# Patient Record
Sex: Male | Born: 1940 | Race: Black or African American | Hispanic: No | Smoking: Former smoker
Health system: Southern US, Community
[De-identification: ages and names within clinical notes are randomized; demographics above are authoritative.]

## PROBLEM LIST (undated history)

## (undated) DIAGNOSIS — I10 Essential (primary) hypertension: Secondary | ICD-10-CM

## (undated) DIAGNOSIS — E785 Hyperlipidemia, unspecified: Secondary | ICD-10-CM

## (undated) DIAGNOSIS — N1831 Chronic kidney disease, stage 3a: Secondary | ICD-10-CM

## (undated) DIAGNOSIS — Z8042 Family history of malignant neoplasm of prostate: Secondary | ICD-10-CM

## (undated) DIAGNOSIS — C61 Malignant neoplasm of prostate: Secondary | ICD-10-CM

## (undated) DIAGNOSIS — N3281 Overactive bladder: Secondary | ICD-10-CM

## (undated) DIAGNOSIS — Z9181 History of falling: Secondary | ICD-10-CM

## (undated) DIAGNOSIS — C7951 Secondary malignant neoplasm of bone: Principal | ICD-10-CM

## (undated) DIAGNOSIS — Z803 Family history of malignant neoplasm of breast: Secondary | ICD-10-CM

## (undated) DIAGNOSIS — G4730 Sleep apnea, unspecified: Secondary | ICD-10-CM

## (undated) DIAGNOSIS — K219 Gastro-esophageal reflux disease without esophagitis: Secondary | ICD-10-CM

## (undated) DIAGNOSIS — H9190 Unspecified hearing loss, unspecified ear: Secondary | ICD-10-CM

## (undated) DIAGNOSIS — C801 Malignant (primary) neoplasm, unspecified: Secondary | ICD-10-CM

## (undated) DIAGNOSIS — D649 Anemia, unspecified: Secondary | ICD-10-CM

## (undated) DIAGNOSIS — F0390 Unspecified dementia without behavioral disturbance: Secondary | ICD-10-CM

## (undated) HISTORY — DX: Family history of malignant neoplasm of prostate: Z80.42

## (undated) HISTORY — PX: OTHER SURGICAL HISTORY: SHX169

## (undated) HISTORY — DX: Gastro-esophageal reflux disease without esophagitis: K21.9

## (undated) HISTORY — DX: Family history of malignant neoplasm of breast: Z80.3

## (undated) HISTORY — PX: HAND SURGERY: SHX662

## (undated) HISTORY — PX: TONSILLECTOMY: SHX5217

## (undated) HISTORY — PX: INGUINAL HERNIA REPAIR: SUR1180

## (undated) HISTORY — PX: PROSTATECTOMY: SHX69

## (undated) HISTORY — PX: CHOLECYSTECTOMY: SHX55

## (undated) HISTORY — DX: Secondary malignant neoplasm of bone: C79.51

## (undated) HISTORY — DX: Hyperlipidemia, unspecified: E78.5

## (undated) HISTORY — DX: Sleep apnea, unspecified: G47.30

## (undated) HISTORY — DX: Malignant neoplasm of prostate: C61

---

## 1999-04-12 ENCOUNTER — Encounter: Admission: RE | Admit: 1999-04-12 | Discharge: 1999-07-08 | Payer: Self-pay | Admitting: Anesthesiology

## 1999-07-08 ENCOUNTER — Encounter: Admission: RE | Admit: 1999-07-08 | Discharge: 1999-10-06 | Payer: Self-pay | Admitting: Anesthesiology

## 1999-07-11 ENCOUNTER — Encounter: Payer: Self-pay | Admitting: Anesthesiology

## 1999-07-31 ENCOUNTER — Encounter: Payer: Self-pay | Admitting: Anesthesiology

## 1999-10-31 ENCOUNTER — Encounter: Admission: RE | Admit: 1999-10-31 | Discharge: 2000-01-29 | Payer: Self-pay | Admitting: Anesthesiology

## 2000-02-12 ENCOUNTER — Encounter: Admission: RE | Admit: 2000-02-12 | Discharge: 2000-05-12 | Payer: Self-pay | Admitting: Anesthesiology

## 2000-05-22 ENCOUNTER — Encounter: Admission: RE | Admit: 2000-05-22 | Discharge: 2000-08-20 | Payer: Self-pay | Admitting: Anesthesiology

## 2000-10-05 ENCOUNTER — Encounter: Admission: RE | Admit: 2000-10-05 | Discharge: 2001-01-03 | Payer: Self-pay | Admitting: Anesthesiology

## 2001-01-20 ENCOUNTER — Encounter: Admission: RE | Admit: 2001-01-20 | Discharge: 2001-04-20 | Payer: Self-pay | Admitting: Anesthesiology

## 2001-05-31 ENCOUNTER — Encounter: Admission: RE | Admit: 2001-05-31 | Discharge: 2001-06-26 | Payer: Self-pay | Admitting: Anesthesiology

## 2001-12-10 ENCOUNTER — Encounter: Payer: Self-pay | Admitting: Otolaryngology

## 2001-12-10 ENCOUNTER — Ambulatory Visit (HOSPITAL_COMMUNITY): Admission: RE | Admit: 2001-12-10 | Discharge: 2001-12-10 | Payer: Self-pay | Admitting: Otolaryngology

## 2001-12-14 ENCOUNTER — Other Ambulatory Visit: Admission: RE | Admit: 2001-12-14 | Discharge: 2001-12-14 | Payer: Self-pay | Admitting: Otolaryngology

## 2001-12-27 ENCOUNTER — Encounter (INDEPENDENT_AMBULATORY_CARE_PROVIDER_SITE_OTHER): Payer: Self-pay | Admitting: *Deleted

## 2001-12-27 ENCOUNTER — Ambulatory Visit (HOSPITAL_BASED_OUTPATIENT_CLINIC_OR_DEPARTMENT_OTHER): Admission: RE | Admit: 2001-12-27 | Discharge: 2001-12-27 | Payer: Self-pay | Admitting: Otolaryngology

## 2002-08-17 ENCOUNTER — Ambulatory Visit (HOSPITAL_COMMUNITY): Admission: RE | Admit: 2002-08-17 | Discharge: 2002-08-17 | Payer: Self-pay | Admitting: Internal Medicine

## 2005-09-22 ENCOUNTER — Ambulatory Visit: Payer: Self-pay | Admitting: Internal Medicine

## 2005-11-03 ENCOUNTER — Ambulatory Visit: Payer: Self-pay | Admitting: Internal Medicine

## 2005-12-03 ENCOUNTER — Ambulatory Visit: Payer: Self-pay | Admitting: Internal Medicine

## 2005-12-03 ENCOUNTER — Ambulatory Visit (HOSPITAL_COMMUNITY): Admission: RE | Admit: 2005-12-03 | Discharge: 2005-12-03 | Payer: Self-pay | Admitting: Internal Medicine

## 2006-06-18 ENCOUNTER — Ambulatory Visit: Payer: Self-pay | Admitting: Internal Medicine

## 2010-04-08 ENCOUNTER — Ambulatory Visit: Admission: RE | Admit: 2010-04-08 | Discharge: 2010-07-07 | Payer: Self-pay | Admitting: Radiation Oncology

## 2010-11-17 ENCOUNTER — Encounter: Payer: Self-pay | Admitting: Radiation Oncology

## 2011-03-14 NOTE — H&P (Signed)
[facility]  Patient:    [name], [name]                        MRN: [id number] Adm. Date:  [id number] Attending:  Thyra Breed CC:         Dorise Hiss, M.D.   History and Physical  FOLLOW-UP EVALUATION:  [name] comes in for follow-up evaluation today. Since his last evaluation, he continues to have anterior thigh pain bilaterally which is brought on by walking or working and improved by laying down. He has noted that the Neurontin and Ultram are helping to a degree, but he is getting incomplete relief. He rates his pain at 8/10. He denies any new neurologic symptoms.  CURRENT MEDICATIONS: 1. Ritalin 10 mg twice a day. 2. Ultram 50 mg one to two p.o. q.8h. 3. Prozac 20 mg two in the morning. 4. Procardia XL. 5. Neurontin 800 mg three times a day.  PHYSICAL EXAMINATION:  VITAL SIGNS:  Blood pressure is 162/84, heart rate is 85, respiratory rate is 20, O2 saturation is 97%, pain level is 8.5/10.  NEUROLOGICAL:  He exhibits chronic changes from his right hand from previous injury. He exhibits negative straight leg raise signs with deep tendon reflexes symmetric.  IMPRESSION: 1. 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 2. Other medical problems per Dorise Hiss, M.D.  DISPOSITION: 1. Continue on Neurontin 800 mg one p.o. q.8h. 2. Continue with his other medications. 3. Trial of Keppra 250 mg one p.o. q.p.m. x 2 weeks and one p.o. b.i.d., #60  with two refills. He is advised of the potential side effects. 4. Follow up with me in 4 weeks. DD:  01/21/01 TD:  01/21/01 Job: [id number]

## 2011-03-14 NOTE — Consult Note (Signed)
[facility]  Patient:    [name], [name]                        MRN: [id number] Proc. Date: 07/27/00 Adm. Date:  [id number] Attending:  Thyra Breed CC:         Lucianne Lei, M.D., [place], [place]   Consultation Report  FOLLOWUP EVALUATION:  [name] comes in for followup evaluation of his chronic low back pain on the basis of lumbar spondylosis.  Since his last evaluation, the patient lost his daughter and missed his last appointment.  He is now coming in for followup.  He continues on Ultram and Ritalin.  He has noted that he is having problems with right ankle discomfort and ongoing groin discomforts.  He notes that the groin discomfort became more prominent after lifting his daughter in her terminal stages.  He did come by and get the Neurontin that we had sent to [place] and he is on that now.  He does feel as though it is helping. He rates his pain at 8/10.  EXAMINATION  VITAL SIGNS:  Blood pressure 150/82, heart rate 70, respiratory rate is 14, O2 saturation is 97%, pain level is 8/10 and temperature is 98.6.  NEUROMUSCULAR:  Straight leg raise signs are negative.  Deep tendon reflexes are symmetric.  He has no tenderness over the Achilles tendon today, with good range of motion of his right ankle and foot.  IMPRESSION 1. Chronic low back pain on the basis of lumbar spondylosis. 2. Right ankle pain which may be related to his back versus secondary to a    tendinopathy of the ankle. 3. Other medical problems per Dr. Lucianne Lei in [place].  DISPOSITION 1. Continue on current medications. 2. The patient was advised he might benefit from a trial of apple cider    vinegar and honey to see if this will reduce some of his articular    discomfort, since it has less side-effects and is much cheaper than    nonsteroidals.  He stated he can take ibuprofen and I advised him that he    could use that as an alternative also. 4. I plan to see the patient back  in followup in about three months. 5. The patient was encouraged to follow up with Dr. Anne Hahn if he thinks he is    getting a hernia in his groin. DD:  07/27/00 TD:  07/28/00 Job: [id number]

## 2011-03-14 NOTE — H&P (Signed)
[facility]  Patient:    [name], [name]                        MRN: [id number] Adm. Date:  [id number] Attending:  Thyra Breed CC:         Dorise Hiss, M.D.   History and Physical  FOLLOW-UP EVALUATION:  Arlan comes in for follow-up evaluation of his chronic low back pain on the basis of lumbar spondylosis.  Since his last evaluation, his lower back discomfort seems to be less of his problem, and he has complained of ongoing groin discomfort.  He saw Dr. _____, who has recommended that he go back to see Dr. Anne Hahn.  He has not seen Dr. Anne Hahn.  He continues on Ritalin, Ultram, Neurontin, and Keppra.  He does feel that the Keppra is helping.  He complains of a dull, achy pain in his groin, radiating along the medial aspect of his left leg to the foot, which is exacerbated by activity such as mowing.  It will last for about 12 to 36 hours after he mows.  PHYSICAL EXAMINATION:  VITAL SIGNS:  Blood pressure is 126/83, heart rate 78, respiratory rate 18, O2 saturation 97%, pain level is 8 out of 10.  MUSCULOSKELETAL/NEUROLOGIC:  Straight leg raise signs are negative.  Deep tendon reflexes are symmetric.  IMPRESSION: 1. Chronic low back pain on the basis of lumbar spondylosis. 2. Groin pain, for which he plans to see Dr. Anne Hahn. 3. Other medical problems per Dr. Anne Hahn.  DISPOSITION: 1. Continue on Neurontin at current dose as well as Keppra at 250 mg one twice    a day. 2. Follow up with me in six weeks, at which time we will consider increasing    the Keppra if he continues to note some benefit from it.  He states he does    not need prescriptions today. DD:  02/18/01 TD:  02/19/01 Job: [id number]

## 2011-03-14 NOTE — H&P (Signed)
[facility]  Patient:    [name], [name]                        MRN: [id number] Adm. Date:  [id number] Attending:  Thyra Breed CC:         Dorise Hiss, M.D.   History and Physical  FOLLOW-UP EVALUATION:  [name] comes in for follow-up evaluation today. Since his last evaluation, the patient has not noted a great deal of difference in his back discomfort. He has remained on the Neurontin and Ultram. He is off the Ritalin now. He is taking the Neurontin sporadically. In addition, he is also on Prozac, Procardia.  PHYSICAL EXAMINATION:  VITAL SIGNS:  Blood pressure is 119/70, heart rate is 79, respiratory rate is 16, O2 saturation is 98%, pain level is 8.5/10.  NEUROLOGICAL:  Straight leg raise signs are negative. Deep tendon reflexes were symmetric in the lower extremities. He has trace edema.  IMPRESSION: 1. Chronic low back pain on the basis of lumbar spondylosis. 2. Peripheral edema, improved. 3. Other medical problems per Dorise Hiss, M.D.  DISPOSITION: 1. Increase Neurontin to 800 mg one p.o. q.6h. He does not need a prescription    for this. 2. Continue on his other medications. 3. Follow up with me in two to three months or here at the [facility]. DD:  06/01/01 TD:  06/01/01 Job: [id number]

## 2011-03-14 NOTE — H&P (Signed)
[facility]  Patient:    [name], [name]                        MRN: [id number] Adm. Date:  [id number] Attending:  Thyra Breed CC:         Dorise Hiss, M.D.                         History and Physical  FOLLOWUP EVALUATION:  The patient comes in for followup evaluation of his chronic low back pain syndrome today.  Since his last evaluation, he has developed increasing edema of his lower extremities over the past week and has not consulted with his primary care physician; he was strongly encouraged to go ahead with this.  His pain in general is not changed from his last visit but he has not been taking the Keppra because he could not afford it.  He has continued on the Ritalin, Neurontin and Ultram.  He did feel as though the Keppra was helping him in the past.  He continues to have back discomfort which is most pronounced when he is walking and improved by lying down.  PHYSICAL EXAMINATION:  VITAL SIGNS:  Blood pressure 151/96.  Heart rate 78.  Respiratory rate is 18. O2 saturation is 98%.  Pain level is 9/10.  EXTREMITIES:  He has 1+ pitting edema of the lower extremities.  Deep tendon reflexes were symmetric in the lower extremities with negative straight leg raise signs.  IMPRESSION: 1. Chronic low back pain syndrome on the basis of lumbar spondylosis with    partial improvement with a combination of Keppra and Neurontin; he has    recently stopped the Keppra. 2. Peripheral edema for which he needs to see Dr. Dorise Hiss. 3. Other medical problems per Dr. Anne Hahn.  DISPOSITION: 1. Stop Ritalin as his pressure is up today; it has not been in the past. 2. Continue on Neurontin and consider restarting the Keppra if he can afford    this. 3. Follow up with me in eight weeks. DD:  04/01/01 TD:  04/02/01 Job: [id number]

## 2011-03-14 NOTE — Op Note (Signed)
NAME:  [name], [name] NO.:  [id number]   MEDICAL RECORD NO.:  [id number]          PATIENT TYPE:  AMB   LOCATION:  DAY                           FACILITY:  [facility]   PHYSICIAN:  Lionel December, M.D.    DATE OF BIRTH:  03-Sep-1941   DATE OF PROCEDURE:  12/03/2005  DATE OF DISCHARGE:                                 OPERATIVE REPORT   PROCEDURE:  Colonoscopy.   INDICATIONS:  Johndavid is a 70 year old Afro-American male with recurrent  hematochezia. It is felt that he may be bleeding from hemorrhoids; however,  he has not responded therapy. His last colonoscopy was in October 2003.  Procedure and risks were reviewed with the patient, and informed consent was  obtained.   MEDICINES FOR CONSCIOUS SEDATION:  Demerol 50 mg IV, Versed 5 mg IV.   FINDINGS:  Procedure performed in endoscopy suite. The patient's vital signs  and O2 sat were monitored during the procedure and remained stable. The  patient was placed in left lateral position. Rectal examination performed.  No abnormality noted on external or digital exam. Olympus videoscope was  placed in rectum where he had a large amount of the thick liquid stool along  with big formed pieces. I was able to pass the scope into sigmoid colon.  Vigorous washing had to be required. He had scattered stool also at sigmoid,  descending and transverse colon. Slowly and carefully, scope was passed into  cecum which was identified by appendiceal orifice and ileocecal valve.  Pictures taken for the record. As the scope was withdrawn, the colonic  mucosa was as carefully examined as possible. No polyps and/or tumor masses  noted. Similarly, no AVMs were noted. Had few small diverticula at sigmoid  colon. Rectal mucosa was normal. The scope was retroflexed to examine  anorectal junction and had hemorrhoids above the dentate line. There was  focal erythema at dentate line, but no bleeding was noted. The patient was  turned on his right side to  move pool of stool on the other side to examine  the underlying mucosa which was normal. The patient was turned back on left  side. Endoscope was straightened and withdrawn. The patient tolerated the  procedure well.   FINAL DIAGNOSES:  1.  Internal hemorrhoids.  2.  Few small diverticula at sigmoid colon.   Suspect recurrent hematochezia secondary to hemorrhoids. The examination was  somewhat compromised by quality of prep.   RECOMMENDATIONS:  1.  High-fiber diet plus fiber supplement 3 to 4 grams per day.  2.  Colace 2 tablets bedtime.   He will keep written record as to frequency of bleeding episodes and return  for OV in four months. If bleeding episodes continue, he may need banding or  injection therapy for these hemorrhoids.      Lionel December, M.D.  Electronically Signed     NR/MEDQ  D:  12/03/2005  T:  12/03/2005  Job:  [id number]   cc:   Dorise Hiss, M.D.  Fax: [phone]

## 2011-03-14 NOTE — Op Note (Signed)
NAME:  [name], [name]                           ACCOUNT NO.:  [id number]   MEDICAL RECORD NO.:  [id number]                   PATIENT TYPE:  AMB   LOCATION:  DAY                                  FACILITY:  [facility]   PHYSICIAN:  R. Roetta Sessions, M.D.              DATE OF BIRTH:  October 16, 1941   DATE OF PROCEDURE:  08/17/2002  DATE OF DISCHARGE:                                 OPERATIVE REPORT   PROCEDURE:  Colonoscopy followed by esophagogastric biopsy.   INDICATIONS FOR PROCEDURE:  The patient's a 70 year old gentleman with  Hemoccult positive stool and a single episode of hematochezia.  He also  takes ibuprofen daily and has a history of peptic ulcer disease.  Colonoscopy is now being done to further evaluate his symptoms.  If  colonoscopy is unrevealing, will proceed with EG.  I have discussed this  approach with the patient today and previously at the bedside.  Potential  risks, benefits, and alternatives have been reviewed and questions answered.  He is agreeable.  ASA score 2 .  Please see my dictated consultation note  for more information.   PROCEDURE NOTE:  O2 saturation, blood pressure, pulse, respirations were  monitored throughout the entire procedure.   CONSCIOUS SEDATION:  IV Versed and Demerol in incremental doses.   INSTRUMENT USED:  Olympus video chip adult colonoscope and diagnostic  gastroscope.   COLONOSCOPY FINDINGS:  Digital rectal exam revealed no abnormalities.   ENDOSCOPIC FINDINGS:  Prep was good.   Rectal:  Examination of the rectal mucosa including retroflex view of the  anal verge and en face view of the anal canal demonstrates friable anal  canal mucosa.  Otherwise, normal rectum.   Colon:  Colonic mucosa was surveyed from the rectosigmoid junction through  the left transverse right colon to the appendiceal orifice and ileocecal  valve.  These structures are well seen and photographed.  The colonic mucosa  of the cecum appeared normal from the level  of the cecal and ileocecal  valve.  The scope was slowly withdrawn and all previous mentioned mucosal  surfaces were again seen and again no other abnormalities were observed and  the patient tolerated the procedure well and was prepared for EGD.  Cetacaine spray was used for topical oropharyngeal anesthesia.   FINDINGS:  Examination of the tubular esophagus revealed no mucosal  abnormalities. EG junction was easily traversed.   Stomach:  Gastric cavity was emptied and insufflated well with air.  Thorough examination of the gastric mucosa including retroflex view of the  proximal stomach, esophagogastric junction demonstrated diffuse petechial  hemorrhages.  There was some minimally nodular folds diffusely.  There were  some superficial antral erosions.  There was no obvious infiltrating  process.  There was no ulcer or other abnormality.  Pylorus was patent and  easily traversed.   Duodenum:  The bulb and second portion appeared normal.   THERAPY/DIAGNOSTIC  MANEUVERS OF THE BODY:  The stomach was biopsied x2 for  histological study.   The patient tolerated the procedure well.  She was reactive to endoscopy.   COLONOSCOPIC IMPRESSION:  1. Friable anal canal.  Otherwise, normal rectum.  2. Normal colon.   ESOPHAGOGASTRIC IMPRESSION:  1. Normal esophagus.  2. Diffuse petechial hemorrhages, superificial erosions, minimal nodularity     of the mucosa.  There were no other abnormalities.  3. Normal duodenum (D1 and D2).   Suspect the patient is easing from ibuprofen insult to his upper GI tract.  We need to rule out H. pylori.   RECOMMENDATIONS:  1. Hold ibuprofen for the time being.  2. Check a CBC and H. pylori serologies today.  3. Further recommendations to follow.                                                 Jonathon Bellows, M.D.    RMR/MEDQ  D:  08/17/2002  T:  08/17/2002  Job:  [id number]   cc:   Dorise Hiss, M.D.

## 2011-03-14 NOTE — H&P (Signed)
NAME:  [name], VERRETTE NO.:  [id number]   MEDICAL RECORD NO.:  [id number]           PATIENT TYPE:  AMB   LOCATION:                                FACILITY:  [facility]   PHYSICIAN:  Lionel December, M.D.    DATE OF BIRTH:  1941-08-07   DATE OF ADMISSION:  12/03/2005  DATE OF DISCHARGE:  LH                                HISTORY & PHYSICAL   PRESENTING COMPLAINT:  Hematochezia.   HISTORY OF PRESENT ILLNESS:  [name] is 70 year old African American male who  was last seen in the office on September 22, 2005 for recurrent hematochezia.  Was felt that he had hemorrhoidal bleeding.  He was given two weeks of  Anusol-HC suppositories and asked to be on high-fiber diet along with  Benefiber.  He now returns for followup visit.  He states he is no better.  He bleeds every couple of days.  Usually it is a small amount of blood but  every now and then it is a large amount or what he describes to be heavy.  He denies anorectal or abdominal pain.  He does not do much strenuous  activity or lift weights, etc.  He remains with good appetite.  The patient  had a colonoscopy back in October 2003 for heme-positive stools.  He had  friable anal canal; otherwise normal exam performed by Dr. Jena Gauss.  He also  had EGD at that time and had H pylori gastritis, for which he was given  Prevpac for two weeks.   MEDICATIONS:  1.  He is presently on Ritalin 10 mg daily.  2.  Procardia 30 mg daily.  3.  Ibuprofen one tablet daily p.r.n.  4.  Ultram up to eight tablets a day.   PAST MEDICAL HISTORY:  1.  Hypertension.  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  3.  He has chronic low back pain secondary arthritis.   PAST SURGICAL HISTORY:  1.  Appendectomy.  2.  Tonsillectomy.  3.  Left inguinal herniorrhaphy.  4.  H pylori gastritis treated in October 2003.   ALLERGIES:  None known.   FAMILY HISTORY:  Negative for colorectal carcinoma.  Father died of prostate  carcinoma at age 59.  He is married.  He has two children in good health.  One daughter died of unknown malignancy at age 69.  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  He  smoked cigarettes for 10 years but quit seven years ago.  He does not drink  alcohol.   PHYSICAL EXAMINATION:  VITAL SIGNS:  Weight 245 pounds, height 5 feet 9-1/2  inches tall.  Pulse 78 per minute, blood pressure 142/74, temperature 98.3.  HEENT:  Conjunctivae pink.  Sclerae nonicteric.  Oropharyngeal mucosa is  normal.  NECK:  No neck masses are noted.  CARDIAC:  Regular rhythm.  Normal S1 and S2.  No murmur or gallop noted.  ABDOMEN:  Protuberant, soft and nontender without organomegaly or masses.  RECTAL:  Deferred.  He did have exam on his last visit which was normal.  EXTREMITIES:  He does not have peripheral edema or clubbing.   ASSESSMENT:  [name] is a 70 year old African American male with chronic  hematochezia.  I suspect that this is hemorrhoidal bleeding.  However, he  has not responded to therapy.  His last colonoscopy was in October 2003.  Examination needs to be repeated to make sure he does not have proctitis or  other lesions in the sigmoid colon or rectum to account for his bleeding.   RECOMMENDATIONS:  A colonoscopy to be performed at [facility] in  the near future.  I have reviewed the procedure with the patient.  He is  agreeable.      Lionel December, M.D.  Electronically Signed     NR/MEDQ  D:  11/03/2005  T:  11/04/2005  Job:  [id number]   cc:   Dorise Hiss, M.D.  Fax: [phone]   [facility]  Fax: [phone]

## 2011-03-14 NOTE — Procedures (Signed)
[facility]  Patient:    [name], [name]                        MRN: [id number] Proc. Date: 10/13/00 Adm. Date:  [id number] Attending:  Thyra Breed                           Procedure Report  NO DICTATION DD:  10/13/00 TD:  10/14/00 Job: [id number]

## 2011-03-14 NOTE — Op Note (Signed)
[facility]  Patient:    [name], [name]                        MRN: [id number] Proc. Date: 10/22/00 Adm. Date:  [id number] Attending:  Thyra Breed CC:         Dorise Hiss, M.D., [place]   Operative Report  PROCEDURE:  Lumbar epidural steroid injection.  DIAGNOSIS:  Lumbar spinal stenosis with lumbar radiculopathy.  ANESTHESIOLOGIST:  Thyra Breed, M.D.  HISTORY:  The patient has noted pretty marked improvement in his pain overall.  He rates it as mild today, although he used an 8 to 9 on scale of 0 to 10, and I think he is reversing the ratio.  He continues on his Neurontin.  His spirits are much improved since he has gotten the epidurals.  PHYSICAL EXAMINATION:  VITAL SIGNS: Blood pressure 144/82, heart rate 80, respiratory rate 16, O2 saturation 97%, temperature 97 degrees.  BACK:  He shows good healing over his previous injection site.  PROCEDURE:  After informed consent was obtained, the patient was placed in the sitting position and monitored.  His back was prepped with Betadine x 3.  A skin wheal was raised at the L4-5 interspace with 1% lidocaine.  A 20-gauge Tuohy needle was introduced in the lumbar epidural space to loss of resistance to preservative free normal saline.  There was no CSF nor blood.  Then 80 mg of Medrol and 8 ml preservative free normal saline was gently injected.  The needle was flushed with preservative free normal saline and removed intact.  POSTPROCEDURE CONDITION:  Stable.  DISCHARGE INSTRUCTIONS: 1. Resume previous diet. 2. Limitation of activities per instruction sheet as outlined by my assistant    today. 3. Continue on his Neurontin. 4. Follow up with me in eight weeks. DD:  10/22/00 TD:  10/22/00 Job: [id number]

## 2011-03-14 NOTE — H&P (Signed)
[facility]  Patient:    [name], [name]                        MRN: [id number] Adm. Date:  [id number] Attending:  Thyra Breed CC:         Dorise Hiss, M.D.   History and Physical  HISTORY OF PRESENT ILLNESS:  [name] comes in for followup evaluation of his lumbar pain with associated lumbar spondylosis and lumbar radiculopathy. Since his last evaluation, he has been relatively stable with regard to his back discomfort but has noted ongoing problems with groin discomfort.  He has not seen his urologist in a while and I highly recommended that he follow up with them.  He notes that he is getting some achiness in his lower extremities, especially brought on by walking.  He denied any new neurologic symptoms today.  CURRENT MEDICATIONS:  He continues on his Neurontin every eight hours of 800 mg, Ritalin, Ultram, Prozac, and Procardia per his primary care physician.  PHYSICAL EXAMINATION:  VITAL SIGNS:  Blood pressure 137/79, heart rate 69, respirations 20, O2 saturation 97%.  Pain level is 8/10 in his groin and minimal in his lower extremities and back.  NEUROLOGIC:  Straight leg raise signs were negative.  Deep tendon reflexes symmetric.  Gait is mildly antalgic.  PAST MEDICAL HISTORY:  For additional history, the patient has had difficulties with peptic ulcers in the past and had been taking Relafen but apparently had exacerbations of his symptoms while taking that.  IMPRESSION: 1. Chronic low-back pain on the basis of the lumbar spondylosis with    radiculopathy responsive to current medical regimen. 2. Other medical problems per Dr. Anne Hahn, his primary care physician.  DISPOSITION: 1. Continue Neurontin 800 mg 1 p.o. q.8h. 2. Since the patient cannot tolerate nonsteroidal anti-inflammatory agents, I    have recommended that he try apple cider vinegar with honey.  I gave him    the recipe for this and he is going to try this. 3. We will see if  he still qualifies for [employer] patient assistance    program. 4. Again, follow up with me in three months.  The patient was encouraged to go    ahead and establish with a urologist for his perineal discomfort. DD:  12/24/00 TD:  12/24/00 Job: [id number]

## 2011-03-14 NOTE — Procedures (Signed)
[facility]  Patient:    LEVOY, GEISEN                        MRN: [id number] Proc. Date: 10/13/00 Adm. Date:  [id number] Attending:  Thyra Breed CC:         Lucianne Lei, M.D., [place], [place]   Procedure Report  PROCEDURE:  Lumbar epidural steroid injection.  DIAGNOSIS:  Lumbar spondylosis with lumbar radiculopathy.  INTERVAL HISTORY:  The patient has noted a fairly significant improvement after his first injection, although he still rates his pain at 8/10.  He continues on his Neurontin.  PHYSICAL EXAMINATION:  Blood pressure 1307/68, heart rate 60, respiratory rate 20, O2 saturations 97%.  Pain level is 8/10, and temperature is 97.1.  He shows good healing from his previous injection site.  DESCRIPTION OF PROCEDURE:  After informed consent was obtained, the patient was placed in the sitting position and monitored.  His back was prepped with Betadine x 3.  A skin wheal was raised at the L4-L5 interspace with 1% lidocaine.  A 20 gauge Tuohy needle was introduced to the lumbar epidural space to loss of resistance to preservative-free normal saline.  There was no CSF nor blood.  Medrol 80 mg in 8 mL of preservative-free normal saline was gently injected. The needle was flushed with preservative-free normal saline and removed intact.  Postprocedure condition - stable.  DISCHARGE INSTRUCTIONS: 1. Resume previous diet. 2. Limitations on activities per instruction sheet. 3. Continue on current medications. 4. Follow up with me in 1-2 weeks. DD:  10/13/00 TD:  10/14/00 Job: [id number]

## 2011-03-14 NOTE — Op Note (Signed)
[facility]  Patient:    [name], [name] Visit Number: [id number] MRN: [id number]          Service Type: DSU Location: [facility] Attending Physician:  Susy Frizzle Dictated by:   Jeannett Senior Pollyann Kennedy, M.D. Proc. Date: 12/27/01 Admit Date:  12/27/2001   CC:         Dorise Hiss, M.D.   Operative Report  PREOPERATIVE DIAGNOSIS:  Right submandibular mass.  POSTOPERATIVE DIAGNOSIS:  Right submandibular mass.  PROCEDURE:  Excisional biopsy of right submandibular mass.  SURGEON:  Jefry H. Pollyann Kennedy, M.D.  ANESTHESIA:  General anesthesia with laryngeal mask airway.  REFERRING PHYSICIAN:  Dorise Hiss, M.D.  DISPOSITION:  The patient tolerated the procedure well, was awakened, extubated, and transferred to recovery in stable condition.  HISTORY:  This is a 70 year old with a three-month history of a right-sided neck mass.  The fine needle aspiration biopsy revealed lymphoid cells.  Risks, benefits, alternatives, complications of the procedure were explained to the patient, who seemed to understand and agreed to surgery.  DESCRIPTION OF PROCEDURE:  The patient was taken to the operating room and placed on the operating table in a supine position.  Following induction of general anesthesia via the laryngeal mask airway, the right neck was prepped and draped in the standard fashion.  A transverse incision was outlined in a skin crease approximately two fingerbreadths below the angle of the mandible. A 15 scalpel was used to incise the skin and subcutaneous tissue.  The platysma was divided with electrocautery and a flap was developed superiorly up to the angle of the mandible.  The marginal mandibular branch of the facial nerve was identified and dissected free of the underlying mass.  The mass was then removed, carefully ligating small-vessel attachments at the base of it. It was sent fresh for pathologic evaluation.  Bipolar cautery was used to provide  hemostasis in the surgical bed.  The wound was irrigated with saline and closed in layers using 3-0 chromic on the platysma and 5-0 nylon on the skin with a rubber band drain left in place.  A dressing was applied.  The patient was then awakened, extubated, and transferred to recovery. Dictated by:   Jeannett Senior Pollyann Kennedy, M.D. Attending Physician:  Susy Frizzle DD:  12/27/01 TD:  12/27/01 Job: [id number]

## 2011-03-14 NOTE — Procedures (Signed)
[facility]  Patient:    [name], [name]                        MRN: [id number] Proc. Date: 10/06/00 Adm. Date:  [id number] Attending:  Thyra Breed CC:         Lucianne Lei, M.D., [place], [place].   Procedure Report  PROCEDURE:  Lumbar epidural steroid injection.  DIAGNOSIS:  Lumbar spondylosis with lumbar radiculopathy.  INTERVAL HISTORY:  The patient was last seen back in October at which time he was relatively stable on the Neurontin Ultram combination. He had just lost his daughter and was very upset over this so we didnt pursue any intervention at that time but he has noted that while caring for his daughter, who is dying of cancer, he lifted her one time and felt a sharp pop type sensation in his back followed by some burning in his left groin. This has persisted. He continues on the Neurontin but he is only taking it twice a day rather than 3 times a day and the Neurontin.  PHYSICAL EXAMINATION:  Blood pressure 144/76, heart rate 72, respiratory rate 20, O2 saturations 98%, pain level is described at 10/10. Straight leg raise signs are negative. Deep tendon reflexes were symmetric in the lower extremities. Motor is 5/5 with symmetric bulk and tone.  DESCRIPTION OF PROCEDURE:  After informed consent was obtained, the patient was placed in the sitting position and monitored. The patients back was prepped with Betadine x 3. A skin wheal was raised at the L4-5 interspace with 1 percent lidocaine. A 20 gauge Tuohy needle was introduced to the lumbar epidural space to loss of resistance to preservative free normal saline. There was no cerebrospinal fluid nor blood. 80 mg of Medrol and 5 ml of preservative free normal saline was gently injected. The needle was flushed with 2 ml of preservative free normal saline.  CONDITION POST PROCEDURE:  Stable.  DISCHARGE INSTRUCTIONS:  Resume previous diet with limitations reviewed by my assistant today.  Continue on current medications but increase the Neurontin to 3 times a day. I will go ahead and look into having another prescription sent off for continuing the patient assistance.  Follow-up with me in 1 week for repeat epidural steroid injection. DD:  10/06/00 TD:  10/06/00 Job: [id number]

## 2011-03-14 NOTE — Consult Note (Signed)
NAME:  [name], [name] NO.:  [id number]   MEDICAL RECORD NO.:  [id number]                  PATIENT TYPE:   LOCATION:                                       FACILITY:   PHYSICIAN:  R. Roetta Sessions, M.D.              DATE OF BIRTH:  [birth year]   DATE OF CONSULTATION:  08/11/2002  DATE OF DISCHARGE:                                   CONSULTATION   REASON FOR CONSULTATION:  Hemoccult positive stool, hematochezia.   HISTORY OF PRESENT ILLNESS:  The patient is a pleasant 70 year old white  gentleman who presents today for further evaluation of hemoccult positive  stool as recommended by Dr. Anne Hahn.  He recently underwent rectal examination  and was found to have hemoccult positive stool.  The patient admits to one  episode of hematochezia four to five months ago.  He complains of  constipation, with having a bowel movement generally every two to three  days.  Denies any melena, abdominal pain, nausea, vomiting, heartburn,  dysphagia, odynophagia, weight loss.  He says he has gained about 30 pounds  over the last two years.  He gives a history of peptic ulcer disease found  at time of endoscopy by Dr. Katrinka Blazing more than 10 years ago.  He has been  taking ibuprofen one to two tablets daily p.r.n. for arthritis-type pain.   CURRENT MEDICATIONS:  1. Ritalin 10 mg b.i.d.  2. Procardia 30 mg q.d.  3. Zanaflex 4 mg b.i.d.  4. Ibuprofen one to two tablets daily p.r.n.   ALLERGIES:  No known drug allergies.   PAST MEDICAL HISTORY:  1. Sleep apnea status post UVP-III surgery.  2. Back injury with chronic back pain.  3. Hypertension.  4. History of peptic ulcer disease as outlined above.  5. Cholecystectomy, 2002.  6. Right inguinal hernia repair.   FAMILY HISTORY:  Father died of prostate cancer.  He has a history of throat  and breast cancer on his mother's side of the family but none in first  degree relatives.  Denies any family history of colorectal  cancer.   SOCIAL HISTORY:  He has been married for 30 years.  He has two living  children.  He is retired from [employer].  He quit smoking over 30-40  years ago.  Denies any alcohol use.   REVIEW OF SYSTEMS:  Please see HPI for GI and GENERAL.  GENITOURINARY:  Complains of urinary hesitancy, frequency; denies nocturia or hematuria or  dysuria.  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  CARDIOPULMONARY:  Denies any chest pain or shortness of breath.   PHYSICAL EXAMINATION:  VITAL SIGNS:  Weight 241.25 pounds, height 5 feet  10.5 inches.  Temperature 97.9, blood pressure 110/70, pulse 86.  GENERAL:  Very pleasant, well-nourished, well-developed, moderately obese  black male in no acute distress.  SKIN:  Warm and dry, no jaundice.  HEENT:  Pupils equal, round, and reactive to light.  Conjunctivae are pink,  sclerae nonicteric.  Oropharyngeal mucosa moist and pink.  No lesions,  erythema, or exudate.  NECK:  No lymphadenopathy, thyromegaly, or carotid bruits.  CHEST:  Lungs clear to auscultation.  CARDIAC:  Reveals regular rate and rhythm, normal S1 and S2.  No murmurs,  rubs, or gallops.  ABDOMEN:  Positive bowel sounds.  Obese but symmetrical.  Soft, nontender.  No organomegaly or masses.  RECTAL:  Examination deferred to time of colonoscopy.  EXTREMITIES:  No edema.  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  This is secondary to a work-  related injury.  He also had a deformity of the third finger on the right  hand due to this as well.  Extremities with no edema.   LABORATORY DATA:  On August 06, 2002:  Glucose 105, BUN 90, creatinine 1.0,  albumin 4.1, alkaline phosphatase 69, SGOT 27, SGPT 26, total bilirubin 0.4,  sodium 139, potassium 3.6.  WBC 6.7; hemoglobin 13.7; hematocrit 40.6;  platelets 155,000.   IMPRESSION:  The patient is a pleasant 70 year old gentleman who presents  today for further  evaluation of hemoccult positive stool and a single  episode of hematochezia.  Hematochezia most likely secondary to a benign  anal or rectal source such as hemorrhoids.  Hemoccult positive stool could  be secondary to hemorrhoids, polyps, AVMs, peptic ulcer disease, etc.  Given  history of ibuprofen, he could have some GI bleeding secondary to mucosal  injury anywhere along the GI tract.  It is reassuring that he has no upper  GI symptomatology.  He does report a history of peptic ulcer disease.  He  complains of mild constipation.  As he has never undergone a colonoscopy and  does have hemoccult positive stools and hematochezia, we recommend one at  this time.  I have discussed the risks, alternatives, and benefits with the  patient and he is agreeable to proceed.  I did recommend that if nothing was  found at time of colonoscopy that we proceed with EGD, and he is agreeable  to this as well.   PLAN:  1. Colonoscopy plus or minus EGD.  2. Fiber Choice two tablets daily.   I would like to thank Dr. Anne Hahn for allowing [place] to take part in the care of  this patient.      Tana Coast, Pricilla Larsson, M.D.    LL/MEDQ  D:  08/11/2002  T:  08/11/2002  Job:  [id number]   cc:   Dorise Hiss, M.D.

## 2012-04-13 ENCOUNTER — Ambulatory Visit: Payer: Self-pay | Admitting: Urology

## 2012-05-25 ENCOUNTER — Ambulatory Visit (INDEPENDENT_AMBULATORY_CARE_PROVIDER_SITE_OTHER): Payer: Medicare HMO | Admitting: Urology

## 2012-05-25 DIAGNOSIS — C61 Malignant neoplasm of prostate: Secondary | ICD-10-CM

## 2012-05-25 DIAGNOSIS — N529 Male erectile dysfunction, unspecified: Secondary | ICD-10-CM

## 2012-06-22 ENCOUNTER — Ambulatory Visit (INDEPENDENT_AMBULATORY_CARE_PROVIDER_SITE_OTHER): Payer: Medicare HMO | Admitting: Urology

## 2012-06-22 DIAGNOSIS — N529 Male erectile dysfunction, unspecified: Secondary | ICD-10-CM

## 2012-06-22 DIAGNOSIS — C61 Malignant neoplasm of prostate: Secondary | ICD-10-CM

## 2012-07-22 ENCOUNTER — Ambulatory Visit (INDEPENDENT_AMBULATORY_CARE_PROVIDER_SITE_OTHER): Payer: Medicare HMO | Admitting: Otolaryngology

## 2012-07-22 DIAGNOSIS — J343 Hypertrophy of nasal turbinates: Secondary | ICD-10-CM

## 2012-07-22 DIAGNOSIS — J310 Chronic rhinitis: Secondary | ICD-10-CM

## 2012-09-02 ENCOUNTER — Ambulatory Visit (INDEPENDENT_AMBULATORY_CARE_PROVIDER_SITE_OTHER): Payer: Medicare HMO | Admitting: Otolaryngology

## 2012-09-02 DIAGNOSIS — J310 Chronic rhinitis: Secondary | ICD-10-CM

## 2012-09-21 ENCOUNTER — Ambulatory Visit (INDEPENDENT_AMBULATORY_CARE_PROVIDER_SITE_OTHER): Payer: Medicare HMO | Admitting: Urology

## 2012-09-21 DIAGNOSIS — C61 Malignant neoplasm of prostate: Secondary | ICD-10-CM

## 2012-09-21 DIAGNOSIS — N529 Male erectile dysfunction, unspecified: Secondary | ICD-10-CM

## 2012-09-28 ENCOUNTER — Other Ambulatory Visit: Payer: Self-pay | Admitting: Urology

## 2012-09-28 DIAGNOSIS — C61 Malignant neoplasm of prostate: Secondary | ICD-10-CM

## 2012-10-01 ENCOUNTER — Ambulatory Visit (HOSPITAL_COMMUNITY)
Admission: RE | Admit: 2012-10-01 | Discharge: 2012-10-01 | Disposition: A | Discharge status: Home / Self Care | Payer: Medicare HMO | Source: Ambulatory Visit | Attending: Urology | Admitting: Urology

## 2012-10-01 ENCOUNTER — Encounter (HOSPITAL_COMMUNITY)
Admission: RE | Admit: 2012-10-01 | Discharge: 2012-10-01 | Disposition: A | Discharge status: Home / Self Care | Payer: Medicare HMO | Source: Ambulatory Visit | Attending: Urology | Admitting: Urology

## 2012-10-01 ENCOUNTER — Encounter (HOSPITAL_COMMUNITY): Payer: Self-pay

## 2012-10-01 DIAGNOSIS — C61 Malignant neoplasm of prostate: Secondary | ICD-10-CM

## 2012-10-01 DIAGNOSIS — Q619 Cystic kidney disease, unspecified: Secondary | ICD-10-CM | POA: Insufficient documentation

## 2012-10-01 HISTORY — DX: Essential (primary) hypertension: I10

## 2012-10-01 HISTORY — DX: Malignant (primary) neoplasm, unspecified: C80.1

## 2012-10-01 MED ORDER — TECHNETIUM TC 99M MEDRONATE IV KIT
25.0000 | PACK | Freq: Once | INTRAVENOUS | Status: AC | PRN
  Administered 2012-10-01: 26 via INTRAVENOUS

## 2012-10-01 MED ORDER — IOHEXOL 300 MG/ML  SOLN
100.0000 mL | Freq: Once | INTRAMUSCULAR | Status: AC | PRN
  Administered 2012-10-01: 100 mL via INTRAVENOUS

## 2012-12-21 ENCOUNTER — Ambulatory Visit: Payer: Medicare HMO | Admitting: Urology

## 2013-01-11 ENCOUNTER — Ambulatory Visit (INDEPENDENT_AMBULATORY_CARE_PROVIDER_SITE_OTHER): Payer: Medicare HMO | Admitting: Urology

## 2013-01-11 DIAGNOSIS — C61 Malignant neoplasm of prostate: Secondary | ICD-10-CM

## 2013-01-11 DIAGNOSIS — N529 Male erectile dysfunction, unspecified: Secondary | ICD-10-CM

## 2013-02-16 ENCOUNTER — Other Ambulatory Visit (HOSPITAL_COMMUNITY): Payer: Medicare HMO

## 2013-02-17 NOTE — Patient Instructions (Addendum)
ARLENE GENOVA  02/17/2013   Your procedure is scheduled on:   02/24/2013   Report to [facility] at  615  AM.  Call this number if you have problems the morning of surgery: [phone]   Remember:   Do not eat food or drink liquids after midnight.   Take these medicines the morning of surgery with A SIP OF WATER:  none   Do not wear jewelry, make-up or nail polish.  Do not wear lotions, powders, or perfumes.   Do not shave 48 hours prior to surgery. Men may shave face and neck.  Do not bring valuables to the hospital.  Contacts, dentures or bridgework may not be worn into surgery.  Leave suitcase in the car. After surgery it may be brought to your room.  For patients admitted to the hospital, checkout time is 11:00 AM the day of discharge.   Patients discharged the day of surgery will not be allowed to drive  home.  Name and phone number of your driver: family  Special Instructions: Shower using CHG 2 nights before surgery and the night before surgery.  If you shower the day of surgery use CHG.  Use special wash - you have one bottle of CHG for all showers.  You should use approximately 1/3 of the bottle for each shower.   Please read over the following fact sheets that you were given: Pain Booklet, Coughing and Deep Breathing, MRSA Information, Surgical Site Infection Prevention, Anesthesia Post-op Instructions and Care and Recovery After Surgery Penile Prosthesis Penile prostheses are semi-rigid or inflatable devices that are implanted into the penis as a treatment for erectile dysfunction (a type of impotence). Erectile dysfunction (ED) may be caused by a number of conditions, such as:  Blood vessel (vascular) diseases.  Diabetes.  Nervous system (neurologic) diseases.  Psychological (stress and anxiety) problems.  Trauma (like spinal cord injuries).  Heavy smoking/drinking.  Prolonged drug abuse.  Surgeries (like removal of the prostate gland). Penile prosthesis  surgery is a procedure for men suffering from ED who have unsuccessfully tried all other treatment options. This surgery is not recommended if you are suffering from ED due to psychological problems. There are two main types of implants used for the surgery.  Malleable penile implant (also known as non-hydraulic or semi-rigid implant).  This consists of two silicone rubber rods.  These help by providing a certain amount of rigidity.  They are also flexible, and hence, the penis can either be curved downwards in the normal position or put into an erect position for intercourse.  Inflatable penile implant (also known as hydraulic implant).  This consists of cylinders, a pump, and a reservoir.  There are different types of inflatable implants. Each has advantages and disadvantages.  The cylinders can be inflated with fluid that helps in having an erection and deflated after intercourse. LET YOUR CAREGIVER KNOW ABOUT:  Allergies.  Use of steroids (by mouth or creams).  Medicines you take including:  herbs.  eyedrops.  prescription medicine.  over-the-counter medicine.  creams.  Use of aspirin or blood-thinning medicines.  History of blood clots (thrombophlebitis).  Previous surgery.  Previous problems with anesthetics.  History of bleeding or blood problems.  Other health problems such as:  Urinary tract infections.  Skin infections. RISKS AND COMPLICATIONS   Your penis may be slightly shorter than it was before surgery.  Your urethra may be injured during the procedure.  Infection could occur to your penile  implant. Rarely, this may occur months or years after the surgery.  Your implant device may not function properly (mechanical failures). BEFORE THE PROCEDURE   You may be asked to shower with a special soap the night or morning before surgery.  You may be given intravenous antibiotics one hour before the surgery.  Hair will be removed from your  surgical site and the site will be thoroughly cleansed.  You may be given regional anesthesia or general anesthesia.  On the day of the surgery, a Foley catheter (a soft, thin, flexible rubber tube) may be passed through the urethra (urinary passage) into the bladder.  The tube will drain urine.  The catheter will also help in guiding your surgeon to identify the urethra during the procedure. PROCEDURE  A small incision is made in the scrotum or in the penis just below the head.  The cylinders of the prosthesis are inserted by the surgeon into the erectile tissue of the penis.  Additional small incisions are made in the abdomen and in the scrotum to insert the pump and the reservoir of the inflatable device.  The cylinders, reservoir (filled with fluid), and pump will be joined by tubes, and tested before your incisions are closed.  The incisions are closed using dissolvable sutures. AFTER THE PROCEDURE   The surgeon may prescribe medicines to ease pain.  You may also be given antibiotics.  You may be put on a clear liquid diet for the first 24 hours.  You may be asked to walk or sit instead of lying down. It is helpful to avoid long periods of immobility.  A towel roll may be placed under your scrotum to help reduce swelling.  The Foley catheter may be removed in the morning after surgery. HOME CARE INSTRUCTIONS  Follow all the instructions that were given by your surgeon at the time of discharge.  You may shower.  Carefully wash the incision area with soap and water, rinse, and pat dry.  Do not soak the wound or swim.  Follow a well-balanced diet. Eat plenty of fruits. Drink fluids regularly to prevent constipation.  Take all prescribed medicine as instructed.  Go for regular caregiver follow-up appointments as advised.  Avoid sexual activity for around 6 weeks.  The device will not be activated until after the recovery period. Then your caregiver will instruct  you on using the device.  Do not lift heavy objects for 4 to 6 weeks after surgery.  Do not wear tight fitting clothes. Wear clothes that are loose and comfortable.  Do not drive any vehicles or sign any legal documents, as long as you are on narcotic pain medicines. SEEK MEDICAL CARE IF:  You develop nausea and vomiting.  You develop chills.  You have an oral temperature above 102 F (38.9 C).  You develop constipation.  You develop swelling around the scrotal or penile area.  You develop difficulty in passing urine.  You develop a burning feeling or pain when passing urine. SEEK IMMEDIATE MEDICAL CARE IF:  You notice oozing of fluid or pus from the incision site.  You notice blood in your urine.  You develop severe pain that is not relieved with medicine.  You have an oral temperature above 102 F (38.9 C), not controlled by medicine.  You are not able to pass any urine. Document Released: 01/20/2008 Document Revised: 01/05/2012 Document Reviewed: 01/20/2008 [facility] Patient Information 2013 [place], [place]. PATIENT INSTRUCTIONS POST-ANESTHESIA  IMMEDIATELY FOLLOWING SURGERY:  Do not drive or  operate machinery for the first twenty four hours after surgery.  Do not make any important decisions for twenty four hours after surgery or while taking narcotic pain medications or sedatives.  If you develop intractable nausea and vomiting or a severe headache please notify your doctor immediately.  FOLLOW-UP:  Please make an appointment with your surgeon as instructed. You do not need to follow up with anesthesia unless specifically instructed to do so.  WOUND CARE INSTRUCTIONS (if applicable):  Keep a dry clean dressing on the anesthesia/puncture wound site if there is drainage.  Once the wound has quit draining you may leave it open to air.  Generally you should leave the bandage intact for twenty four hours unless there is drainage.  If the epidural site drains for more than  36-48 hours please call the anesthesia department.  QUESTIONS?:  Please feel free to call your physician or the hospital operator if you have any questions, and they will be happy to assist you.

## 2013-02-18 ENCOUNTER — Encounter (HOSPITAL_COMMUNITY)
Admission: RE | Admit: 2013-02-18 | Discharge: 2013-02-18 | Disposition: A | Discharge status: Home / Self Care | Payer: Medicare HMO | Source: Ambulatory Visit | Attending: Urology | Admitting: Urology

## 2013-02-18 DIAGNOSIS — N529 Male erectile dysfunction, unspecified: Secondary | ICD-10-CM | POA: Insufficient documentation

## 2013-02-18 DIAGNOSIS — Z01812 Encounter for preprocedural laboratory examination: Secondary | ICD-10-CM | POA: Insufficient documentation

## 2013-02-18 DIAGNOSIS — Z5309 Procedure and treatment not carried out because of other contraindication: Secondary | ICD-10-CM | POA: Insufficient documentation

## 2013-02-21 ENCOUNTER — Encounter (HOSPITAL_COMMUNITY): Payer: Self-pay | Admitting: Pharmacy Technician

## 2013-02-21 ENCOUNTER — Encounter (HOSPITAL_COMMUNITY): Payer: Self-pay

## 2013-02-21 ENCOUNTER — Other Ambulatory Visit: Payer: Self-pay

## 2013-02-21 ENCOUNTER — Encounter (HOSPITAL_COMMUNITY)
Admission: RE | Admit: 2013-02-21 | Discharge: 2013-02-21 | Disposition: A | Discharge status: Home / Self Care | Payer: Medicare HMO | Source: Ambulatory Visit | Attending: Urology | Admitting: Urology

## 2013-02-21 LAB — BASIC METABOLIC PANEL
CO2: 27 mEq/L (ref 19–32)
Chloride: 98 mEq/L (ref 96–112)
Potassium: 3.5 mEq/L (ref 3.5–5.1)
Sodium: 134 mEq/L — ABNORMAL LOW (ref 135–145)

## 2013-02-21 LAB — HEMOGLOBIN AND HEMATOCRIT, BLOOD
HCT: 37.6 % — ABNORMAL LOW (ref 39.0–52.0)
Hemoglobin: 13.2 g/dL (ref 13.0–17.0)

## 2013-02-21 LAB — SURGICAL PCR SCREEN: Staphylococcus aureus: NEGATIVE

## 2013-02-22 MED ORDER — GENTAMICIN IN SALINE 1.6-0.9 MG/ML-% IV SOLN: 80.0000 mg | Freq: Once | INTRAVENOUS | Status: DC

## 2013-02-22 MED ORDER — CEFAZOLIN SODIUM-DEXTROSE 2-3 GM-% IV SOLR: 2.0000 g | Freq: Once | INTRAVENOUS | Status: DC

## 2013-02-24 ENCOUNTER — Ambulatory Visit (HOSPITAL_COMMUNITY): Admission: RE | Admit: 2013-02-24 | Payer: Medicare HMO | Source: Ambulatory Visit | Admitting: Urology

## 2013-02-24 ENCOUNTER — Encounter (HOSPITAL_COMMUNITY): Admission: RE | Payer: Self-pay | Source: Ambulatory Visit

## 2013-02-24 SURGERY — INSERTION, PENILE PROSTHESIS, INFLATABLE
Anesthesia: Choice

## 2013-04-13 NOTE — OR Nursing (Signed)
Per [name] in office patient requested to cancel  This surgery due to the flu

## 2013-05-24 ENCOUNTER — Ambulatory Visit: Payer: Medicare HMO | Admitting: Urology

## 2013-06-14 ENCOUNTER — Ambulatory Visit: Payer: Medicare HMO | Admitting: Urology

## 2013-06-28 ENCOUNTER — Ambulatory Visit: Payer: Medicare HMO | Admitting: Urology

## 2013-07-26 ENCOUNTER — Ambulatory Visit (INDEPENDENT_AMBULATORY_CARE_PROVIDER_SITE_OTHER): Payer: Medicare HMO | Admitting: Urology

## 2013-07-26 DIAGNOSIS — N529 Male erectile dysfunction, unspecified: Secondary | ICD-10-CM

## 2013-07-26 DIAGNOSIS — C61 Malignant neoplasm of prostate: Secondary | ICD-10-CM

## 2014-01-24 ENCOUNTER — Ambulatory Visit: Payer: Medicare HMO | Admitting: Urology

## 2014-02-28 ENCOUNTER — Ambulatory Visit: Payer: Medicare HMO | Admitting: Urology

## 2014-05-08 ENCOUNTER — Encounter: Payer: Self-pay | Admitting: Urology

## 2015-11-05 DIAGNOSIS — E298 Other testicular dysfunction: Secondary | ICD-10-CM | POA: Diagnosis not present

## 2015-11-05 DIAGNOSIS — Z6839 Body mass index (BMI) 39.0-39.9, adult: Secondary | ICD-10-CM | POA: Diagnosis not present

## 2015-11-05 DIAGNOSIS — I10 Essential (primary) hypertension: Secondary | ICD-10-CM | POA: Diagnosis not present

## 2015-12-24 ENCOUNTER — Encounter: Payer: Self-pay | Admitting: Urology

## 2015-12-24 DIAGNOSIS — C61 Malignant neoplasm of prostate: Secondary | ICD-10-CM | POA: Diagnosis not present

## 2015-12-24 DIAGNOSIS — R350 Frequency of micturition: Secondary | ICD-10-CM | POA: Diagnosis not present

## 2015-12-25 DIAGNOSIS — R350 Frequency of micturition: Secondary | ICD-10-CM | POA: Diagnosis not present

## 2015-12-26 DIAGNOSIS — R972 Elevated prostate specific antigen [PSA]: Secondary | ICD-10-CM | POA: Diagnosis not present

## 2015-12-26 DIAGNOSIS — C61 Malignant neoplasm of prostate: Secondary | ICD-10-CM | POA: Diagnosis not present

## 2015-12-27 DIAGNOSIS — C61 Malignant neoplasm of prostate: Secondary | ICD-10-CM | POA: Diagnosis not present

## 2015-12-31 ENCOUNTER — Encounter: Payer: Self-pay | Admitting: Urology

## 2015-12-31 DIAGNOSIS — K769 Liver disease, unspecified: Secondary | ICD-10-CM | POA: Diagnosis not present

## 2015-12-31 DIAGNOSIS — C61 Malignant neoplasm of prostate: Secondary | ICD-10-CM | POA: Diagnosis not present

## 2015-12-31 DIAGNOSIS — M4854XA Collapsed vertebra, not elsewhere classified, thoracic region, initial encounter for fracture: Secondary | ICD-10-CM | POA: Diagnosis not present

## 2015-12-31 DIAGNOSIS — R590 Localized enlarged lymph nodes: Secondary | ICD-10-CM | POA: Diagnosis not present

## 2016-01-09 DIAGNOSIS — H2513 Age-related nuclear cataract, bilateral: Secondary | ICD-10-CM | POA: Diagnosis not present

## 2016-01-09 DIAGNOSIS — H538 Other visual disturbances: Secondary | ICD-10-CM | POA: Diagnosis not present

## 2016-01-17 DIAGNOSIS — N451 Epididymitis: Secondary | ICD-10-CM | POA: Diagnosis not present

## 2016-01-17 DIAGNOSIS — C61 Malignant neoplasm of prostate: Secondary | ICD-10-CM | POA: Diagnosis not present

## 2016-01-17 DIAGNOSIS — C772 Secondary and unspecified malignant neoplasm of intra-abdominal lymph nodes: Secondary | ICD-10-CM | POA: Diagnosis not present

## 2016-01-17 DIAGNOSIS — R972 Elevated prostate specific antigen [PSA]: Secondary | ICD-10-CM | POA: Diagnosis not present

## 2016-01-28 DIAGNOSIS — C61 Malignant neoplasm of prostate: Secondary | ICD-10-CM | POA: Diagnosis not present

## 2016-01-28 DIAGNOSIS — I10 Essential (primary) hypertension: Secondary | ICD-10-CM | POA: Diagnosis not present

## 2016-02-05 DIAGNOSIS — C61 Malignant neoplasm of prostate: Secondary | ICD-10-CM | POA: Diagnosis not present

## 2016-02-07 DIAGNOSIS — I10 Essential (primary) hypertension: Secondary | ICD-10-CM | POA: Diagnosis not present

## 2016-02-07 DIAGNOSIS — Z125 Encounter for screening for malignant neoplasm of prostate: Secondary | ICD-10-CM | POA: Diagnosis not present

## 2016-03-04 DIAGNOSIS — C7951 Secondary malignant neoplasm of bone: Secondary | ICD-10-CM | POA: Diagnosis not present

## 2016-03-04 DIAGNOSIS — C61 Malignant neoplasm of prostate: Secondary | ICD-10-CM | POA: Diagnosis not present

## 2016-04-03 DIAGNOSIS — I10 Essential (primary) hypertension: Secondary | ICD-10-CM | POA: Diagnosis not present

## 2016-04-04 ENCOUNTER — Encounter (HOSPITAL_COMMUNITY): Payer: Self-pay | Admitting: Oncology

## 2016-04-04 ENCOUNTER — Other Ambulatory Visit (HOSPITAL_COMMUNITY): Payer: Self-pay | Admitting: Oncology

## 2016-04-04 DIAGNOSIS — C61 Malignant neoplasm of prostate: Secondary | ICD-10-CM | POA: Diagnosis not present

## 2016-04-04 DIAGNOSIS — C7951 Secondary malignant neoplasm of bone: Secondary | ICD-10-CM

## 2016-04-04 HISTORY — DX: Malignant neoplasm of prostate: C61

## 2016-04-04 HISTORY — DX: Secondary malignant neoplasm of bone: C79.51

## 2016-04-07 ENCOUNTER — Ambulatory Visit (HOSPITAL_COMMUNITY): Payer: Medicare HMO

## 2016-04-07 DIAGNOSIS — R972 Elevated prostate specific antigen [PSA]: Secondary | ICD-10-CM | POA: Diagnosis not present

## 2016-04-07 DIAGNOSIS — C772 Secondary and unspecified malignant neoplasm of intra-abdominal lymph nodes: Secondary | ICD-10-CM | POA: Diagnosis not present

## 2016-04-07 DIAGNOSIS — C61 Malignant neoplasm of prostate: Secondary | ICD-10-CM | POA: Diagnosis not present

## 2016-04-09 ENCOUNTER — Telehealth (HOSPITAL_COMMUNITY): Payer: Self-pay | Admitting: Hematology & Oncology

## 2016-04-09 NOTE — Telephone Encounter (Signed)
PT'S INS HAS TERMED WITH [employer]. TRIED TO CONTACT PT BUT THE ONLY NUMBER WE HAVE IS OUT OF SERVICE. CONTACTED STAR @ [place]

## 2016-04-14 DIAGNOSIS — C772 Secondary and unspecified malignant neoplasm of intra-abdominal lymph nodes: Secondary | ICD-10-CM | POA: Diagnosis not present

## 2016-04-14 DIAGNOSIS — C61 Malignant neoplasm of prostate: Secondary | ICD-10-CM | POA: Diagnosis not present

## 2016-05-02 ENCOUNTER — Encounter (HOSPITAL_COMMUNITY): Payer: Medicare Other

## 2016-05-02 ENCOUNTER — Other Ambulatory Visit (HOSPITAL_COMMUNITY): Payer: Self-pay | Admitting: *Deleted

## 2016-05-02 ENCOUNTER — Ambulatory Visit (HOSPITAL_COMMUNITY): Payer: Medicare HMO | Admitting: Hematology & Oncology

## 2016-05-02 DIAGNOSIS — C61 Malignant neoplasm of prostate: Secondary | ICD-10-CM

## 2016-05-02 DIAGNOSIS — C7951 Secondary malignant neoplasm of bone: Secondary | ICD-10-CM

## 2016-05-06 DIAGNOSIS — Z0000 Encounter for general adult medical examination without abnormal findings: Secondary | ICD-10-CM | POA: Diagnosis not present

## 2016-05-06 DIAGNOSIS — M170 Bilateral primary osteoarthritis of knee: Secondary | ICD-10-CM | POA: Diagnosis not present

## 2016-05-06 DIAGNOSIS — I10 Essential (primary) hypertension: Secondary | ICD-10-CM | POA: Diagnosis not present

## 2016-05-06 DIAGNOSIS — Z1389 Encounter for screening for other disorder: Secondary | ICD-10-CM | POA: Diagnosis not present

## 2016-05-07 ENCOUNTER — Encounter (HOSPITAL_COMMUNITY): Payer: Self-pay | Admitting: Hematology

## 2016-05-07 ENCOUNTER — Encounter (HOSPITAL_COMMUNITY): Payer: Medicare Other | Attending: Hematology | Admitting: Hematology

## 2016-05-07 ENCOUNTER — Encounter (HOSPITAL_COMMUNITY): Payer: Medicare Other

## 2016-05-07 ENCOUNTER — Encounter (HOSPITAL_BASED_OUTPATIENT_CLINIC_OR_DEPARTMENT_OTHER): Payer: Medicare Other

## 2016-05-07 VITALS — BP 165/87 | HR 65 | Temp 98.2°F | Resp 18 | Ht 69.0 in | Wt 266.0 lb

## 2016-05-07 DIAGNOSIS — C61 Malignant neoplasm of prostate: Secondary | ICD-10-CM

## 2016-05-07 DIAGNOSIS — C7951 Secondary malignant neoplasm of bone: Secondary | ICD-10-CM

## 2016-05-07 DIAGNOSIS — N50819 Testicular pain, unspecified: Secondary | ICD-10-CM

## 2016-05-07 DIAGNOSIS — I10 Essential (primary) hypertension: Secondary | ICD-10-CM | POA: Diagnosis not present

## 2016-05-07 DIAGNOSIS — E669 Obesity, unspecified: Secondary | ICD-10-CM | POA: Diagnosis not present

## 2016-05-07 LAB — CBC WITH DIFFERENTIAL/PLATELET
BASOS PCT: 0 %
Basophils Absolute: 0 10*3/uL (ref 0.0–0.1)
EOS ABS: 0.1 10*3/uL (ref 0.0–0.7)
EOS PCT: 2 %
HCT: 36.4 % — ABNORMAL LOW (ref 39.0–52.0)
Hemoglobin: 12.4 g/dL — ABNORMAL LOW (ref 13.0–17.0)
Lymphocytes Relative: 41 %
Lymphs Abs: 2.4 10*3/uL (ref 0.7–4.0)
MCH: 29.1 pg (ref 26.0–34.0)
MCHC: 34.1 g/dL (ref 30.0–36.0)
MCV: 85.4 fL (ref 78.0–100.0)
MONOS PCT: 7 %
Monocytes Absolute: 0.4 10*3/uL (ref 0.1–1.0)
NEUTROS PCT: 50 %
Neutro Abs: 2.9 10*3/uL (ref 1.7–7.7)
PLATELETS: 135 10*3/uL — AB (ref 150–400)
RBC: 4.26 MIL/uL (ref 4.22–5.81)
RDW: 13.7 % (ref 11.5–15.5)
WBC: 5.9 10*3/uL (ref 4.0–10.5)

## 2016-05-07 LAB — URINALYSIS, ROUTINE W REFLEX MICROSCOPIC
BILIRUBIN URINE: NEGATIVE
Glucose, UA: NEGATIVE mg/dL
HGB URINE DIPSTICK: NEGATIVE
KETONES UR: NEGATIVE mg/dL
Leukocytes, UA: NEGATIVE
Nitrite: NEGATIVE
Protein, ur: NEGATIVE mg/dL
SPECIFIC GRAVITY, URINE: 1.02 (ref 1.005–1.030)
pH: 6 (ref 5.0–8.0)

## 2016-05-07 LAB — COMPREHENSIVE METABOLIC PANEL
ALT: 21 U/L (ref 17–63)
ANION GAP: 7 (ref 5–15)
AST: 30 U/L (ref 15–41)
Albumin: 4.3 g/dL (ref 3.5–5.0)
Alkaline Phosphatase: 56 U/L (ref 38–126)
BUN: 11 mg/dL (ref 6–20)
CHLORIDE: 104 mmol/L (ref 101–111)
CO2: 25 mmol/L (ref 22–32)
Calcium: 9.1 mg/dL (ref 8.9–10.3)
Creatinine, Ser: 1.06 mg/dL (ref 0.61–1.24)
GFR calc non Af Amer: >60 mL/min (ref >60)
Glucose, Bld: 80 mg/dL (ref 65–99)
POTASSIUM: 4.1 mmol/L (ref 3.5–5.1)
SODIUM: 136 mmol/L (ref 135–145)
Total Bilirubin: 0.6 mg/dL (ref 0.3–1.2)
Total Protein: 6.7 g/dL (ref 6.5–8.1)

## 2016-05-07 LAB — PSA: PSA: 0.16 ng/mL (ref 0.00–4.00)

## 2016-05-07 MED ORDER — DENOSUMAB 120 MG/1.7ML ~~LOC~~ SOLN
120.0000 mg | Freq: Once | SUBCUTANEOUS | Status: AC
  Administered 2016-05-07: 120 mg via SUBCUTANEOUS
  Filled 2016-05-07: qty 1.7

## 2016-05-07 NOTE — Progress Notes (Signed)
[name] presents today for injection per the provider's orders.  Xgeva administration without incident; see MAR for injection details.  Patient tolerated procedure well and without incident.  No questions or complaints noted at this time.

## 2016-05-07 NOTE — Progress Notes (Signed)
Marland Kitchen    HEMATOLOGY/ONCOLOGY CONSULTATION NOTE  Date of Service: 05/07/2016  Patient Care Team: Neale Burly, MD as PCP - General (Unknown Physician Specialty)  CHIEF COMPLAINTS/PURPOSE OF CONSULTATION:  Continued management of metastatic prostate cancer.  HISTORY OF PRESENTING ILLNESS:  [name] is a wonderful 75 y.o. male who has been referred to [place] by Dr .Neale Burly, MD and Dr Everardo All for continued management of metastatic prostate cancer.  Patient has a history of hypertension, obesity and metastatic prostate cancer with details as noted below. Prostate cancer as well as diagnosed in 2010 when he had robotic radical prostatectomy at [facility]. He however had a rising PSA postoperatively and was treated the following in with radiation therapy to his prostate bed. He was then lost to follow-up for a while considering he was cancer free. He went back to see Dr. Charlynne Cousins urologist] earlier in 2017 and his PSA was reevaluated and noted to be about 10. Bone scan was apparently negative for metastatic disease however he had an T8 compression fracture which was thought to be traumatic from being thrown off a lawn mower. PET CT scan showed abdominal adenopathy and the perihepatic 2.2 cm nodule adjacent to the dome of the liver. He was started on Depo-Lupron and given his compression fracture from trauma and high risk of bone metastases was started on Xgeva. He has been following with Dr. Exie Parody for his every 3 month injection of Depo-Lupron. His PSA levels have since progressively dropped and were down to 0.2 on 04/04/2016.  Patient reports no new localized symptoms. Report that he got his last dose of Lupron 2 weeks ago with Dr. Exie Parody. No new bone pains. Note some unquantifiable weight gain. Has had some issues with hot flashes. No other acute new concerns.  ONCOLOGIC HISTORY   History of prostate cancer now metastatic  05/15/2009 Initial Diagnosis    Prostate biopsy by Dr. Michela Pitcher reveals prostate adenocarcinoma  07/09/2009 Surgery  Radical prostatectomy by Dr.Hemal at [facility] reveals T2, N0 Gleason score 7 adenocarcinoma with no extracapsular extension or seminal vesicle invasion and all margins were negative, 2 lymph nodes were negative  05/23/2010 - 06/05/2010 Radiation  61.2 gray administered to the prostatic bed by Dr. Lance Morin for a rising PSA level postoperatively  12/24/2015 Progression  PSA 10.3 with negative bone scan except for T8 traumatic vertebral body collapse but positive CT scan with abdominal lymphadenopathy and a peri-hepatic 2.2 cm nodule adjacent to the dome of the liver  12/27/2015 - Hormone Therapy  Lupron initiated along with one month bicalutamide therapy  Currently on Lupron and Xgeva    MEDICAL HISTORY:  Past Medical History  Diagnosis Date  . Hypertension   . Cancer [facility])     prostate  . Prostate cancer metastatic to bone ([place]) 04/04/2016    SURGICAL HISTORY: Past Surgical History  Procedure Laterality Date  . Inguinal hernia repair Left   . Prostatectomy    . Hand surgery      SOCIAL HISTORY: Social History   Social History  . Marital Status: Widowed    Spouse Name: N/A  . Number of Children: N/A  . Years of Education: N/A   Occupational History  . Not on file.   Social History Main Topics  . Smoking status: Former Research scientist (life sciences)  . Smokeless tobacco: Not on file  . Alcohol Use: No  . Drug Use: No  . Sexual Activity: Not on file  Other Topics Concern  . Not on file   Social History Narrative    FAMILY HISTORY: History reviewed. No pertinent family history.  ALLERGIES:  has No Known Allergies.  MEDICATIONS:  Current Outpatient Prescriptions  Medication Sig Dispense Refill  . benazepril (LOTENSIN) 20 MG tablet Take 20 mg by mouth daily.    . cetirizine (ZYRTEC) 10 MG tablet Take 10 mg by mouth daily.    . hydrochlorothiazide (HYDRODIURIL) 25 MG tablet Take  25 mg by mouth daily.    . methylphenidate (RITALIN) 20 MG tablet Take 20 mg by mouth daily.    Marland Kitchen omeprazole (PRILOSEC) 40 MG capsule Take 40 mg by mouth daily.    . traMADol (ULTRAM) 50 MG tablet Take 50 mg by mouth every 6 (six) hours as needed. Pain     No current facility-administered medications for this visit.    REVIEW OF SYSTEMS:    10 Point review of Systems was done is negative except as noted above.  PHYSICAL EXAMINATION: ECOG PERFORMANCE STATUS: 1 - Symptomatic but completely ambulatory  . Vitals:   05/07/16 1500  BP: (!) 165/87  Pulse: 65  Resp: 18  Temp: 98.2 F (36.8 C)   Filed Weights   05/07/16 1500  Weight: 266 lb (120.7 kg)   .Body mass index is 39.28 kg/m.  GENERAL:alert, in no acute distress and comfortable, HOH SKIN: skin color, texture, turgor are normal, no rashes or significant lesions EYES: normal, conjunctiva are pink and non-injected, sclera clear OROPHARYNX:no exudate, no erythema and lips, buccal mucosa, and tongue normal  NECK: supple, no JVD, thyroid normal size, non-tender, without nodularity LYMPH:  no palpable lymphadenopathy in the cervical, axillary or inguinal LUNGS: clear to auscultation with normal respiratory effort HEART: regular rate & rhythm,  no murmurs and no lower extremity edema ABDOMEN: abdomen soft, non-tender, normoactive bowel sounds  PSYCH: alert & oriented x 3 with fluent speech NEURO: no focal motor/sensory deficits  LABORATORY DATA:  I have reviewed the data as listed  . CBC Latest Ref Rng & Units 05/07/2016 02/21/2013  WBC 4.0 - 10.5 K/uL 5.9 -  Hemoglobin 13.0 - 17.0 g/dL 12.4(L) 13.2  Hematocrit 39.0 - 52.0 % 36.4(L) 37.6(L)  Platelets 150 - 400 K/uL 135(L) -    . CMP Latest Ref Rng & Units 06/04/2016 05/07/2016 02/21/2013  Glucose 65 - 99 mg/dL 109(H) 80 97  BUN 6 - 20 mg/dL 13 11 12   Creatinine 0.61 - 1.24 mg/dL 1.11 1.06 1.20  Sodium 135 - 145 mmol/L 138 136 134(L)  Potassium 3.5 - 5.1 mmol/L 3.6 4.1  3.5  Chloride 101 - 111 mmol/L 106 104 98  CO2 22 - 32 mmol/L 25 25 27   Calcium 8.9 - 10.3 mg/dL 8.9 9.1 9.3  Total Protein 6.5 - 8.1 g/dL 6.7 6.7 -  Total Bilirubin 0.3 - 1.2 mg/dL 0.4 0.6 -  Alkaline Phos 38 - 126 U/L 52 56 -  AST 15 - 41 U/L 29 30 -  ALT 17 - 63 U/L 19 21 -   Component     Latest Ref Rng & Units 05/07/2016  WBC     4.0 - 10.5 K/uL 5.9  RBC     4.22 - 5.81 MIL/uL 4.26  Hemoglobin     13.0 - 17.0 g/dL 12.4 (L)  HCT     39.0 - 52.0 % 36.4 (L)  MCV     78.0 - 100.0 fL 85.4  MCH     26.0 - 34.0 pg  29.1  MCHC     30.0 - 36.0 g/dL 34.1  RDW     11.5 - 15.5 % 13.7  Platelets     150 - 400 K/uL 135 (L)  Neutrophils     % 50  NEUT#     1.7 - 7.7 K/uL 2.9  Lymphocytes     % 41  Lymphocyte #     0.7 - 4.0 K/uL 2.4  Monocytes Relative     % 7  Monocyte #     0.1 - 1.0 K/uL 0.4  Eosinophil     % 2  Eosinophils Absolute     0.0 - 0.7 K/uL 0.1  Basophil     % 0  Basophils Absolute     0.0 - 0.1 K/uL 0.0  WBC Morphology      ATYPICAL LYMPHOCYTES  Sodium     135 - 145 mmol/L 136  Potassium     3.5 - 5.1 mmol/L 4.1  Chloride     101 - 111 mmol/L 104  CO2     22 - 32 mmol/L 25  Glucose     65 - 99 mg/dL 80  BUN     6 - 20 mg/dL 11  Creatinine     0.61 - 1.24 mg/dL 1.06  Calcium     8.9 - 10.3 mg/dL 9.1  Total Protein     6.5 - 8.1 g/dL 6.7  Albumin     3.5 - 5.0 g/dL 4.3  AST     15 - 41 U/L 30  ALT     17 - 63 U/L 21  Alkaline Phosphatase     38 - 126 U/L 56  Total Bilirubin     0.3 - 1.2 mg/dL 0.6  EGFR (Non-African Amer.)     >60 mL/min >60  EGFR (African American)     >60 mL/min >60  Anion gap     5 - 15 7  Color, Urine     YELLOW YELLOW  Appearance     CLEAR CLEAR  Specific Gravity, Urine     1.005 - 1.030 1.020  pH     5.0 - 8.0 6.0  Glucose     NEGATIVE mg/dL NEGATIVE  Hgb urine dipstick     NEGATIVE NEGATIVE  Bilirubin Urine     NEGATIVE NEGATIVE  Ketones, ur     NEGATIVE mg/dL NEGATIVE  Protein     NEGATIVE  mg/dL NEGATIVE  Nitrite     NEGATIVE NEGATIVE  Leukocytes, UA     NEGATIVE NEGATIVE  Specimen Description      URINE, CLEAN CATCH  Special Requests      NONE  Culture      <10,000 COLONIES/mL INSIGNIFICANT GROWTH (A) . . .  Report Status      05/09/2016 FINAL  Testosterone     348 - 1,197 ng/dL <3 (L)  Comment, Testosterone      Comment  PSA     0.00 - 4.00 ng/mL 0.16  Testosterone Free     6.6 - 18.1 pg/mL 0.4 (L)  Testosterone-% Free     0.2 - 0.7 % 0.7 (L)    RADIOGRAPHIC STUDIES: I have personally reviewed the radiological images as listed and agreed with the findings in the report. No results found.  ASSESSMENT & PLAN:   75 year old Caucasian male with  #1 metastatic prostate cancer with metastases to multiple sites. PSA levels were more than 10 when noted to have metastatic disease earlier  this year. It is now down to 0.16 today. Testosterone levels a little suppressed. Patient tolerating Depo-Lupron without any issues other than some mild hot flashes and some weight gain. No acute new symptoms suggesting progression of his prostate cancer.  #2 T8 compression fracture versus pathologic fracture #3 hypertension #4 obesity PLAN -Continue Depo-Lupron every 3 months with Dr. Exie Parody his urologist (last dose was 2 weeks ago) -Continue Xgeva every 4 weeks -Counseled to continue calcium and vitamin D replacement. -Before meals levels every 2-3 months. -Counseled to maintain active lifestyle with appropriate diet. -Continue follow-up with primary care physician -Repeat imaging as necessary if new symptoms or increase in PSA levels.  Return to care with Dr. Whitney Muse in 3 months with repeat CBC, CMP, PSA earlier if any new issues.  All of the patients questions were answered to his apparent satisfaction. The patient knows to call the clinic with any problems, questions or concerns.  I spent 60 minutes counseling the patient face to face. The total time spent in the  appointment was 60 minutes and more than 50% was on counseling and direct patient cares.    Sullivan Lone MD [place] AAHIV[facility] Hematology/Oncology Physician [facility]  (Office):       [phone] (Work cell):  [phone] (Fax):           [phone]  05/07/2016 3:04 PM

## 2016-05-07 NOTE — Patient Instructions (Signed)
[place] at [facility] Discharge Instructions  RECOMMENDATIONS MADE BY THE CONSULTANT AND ANY TEST RESULTS WILL BE SENT TO YOUR REFERRING PHYSICIAN.  You were seen by Dr Irene Limbo today. We will collect a urine sample today before you leave. Continue Xgeva treatments every 4 weeks. Return to clinic for lab work and follow-up appointment in 3 months. Call clinic with any questions or concerns.   Thank you for choosing [place] at [facility] to provide your oncology and hematology care.  To afford each patient quality time with our provider, please arrive at least 15 minutes before your scheduled appointment time.   Beginning January 23rd 2017 lab work for the [facility] will be done in the  Main lab at [facility] on 1st floor. If you have a lab appointment with the [place] please come in thru the  Main Entrance and check in at the main information desk  You need to re-schedule your appointment should you arrive 10 or more minutes late.  We strive to give you quality time with our providers, and arriving late affects you and other patients whose appointments are after yours.  Also, if you no show three or more times for appointments you may be dismissed from the clinic at the providers discretion.     Again, thank you for choosing [facility].  Our hope is that these requests will decrease the amount of time that you wait before being seen by our physicians.       _____________________________________________________________  Should you have questions after your visit to [facility], please contact our office at [phone] between the hours of 8:30 a.m. and 4:30 p.m.  Voicemails left after 4:30 p.m. will not be returned until the following business day.  For prescription refill requests, have your pharmacy contact our office.         Resources For Cancer Patients and their Caregivers ? American Cancer  Society: Can assist with transportation, wigs, general needs, runs Look Good Feel Better.        (415)394-1802 ? Cancer Care: Provides financial assistance, online support groups, medication/co-pay assistance.  1-800-813-HOPE 315 086 1914) ? [place] Assists [place] cancer patients and their families through emotional , educational and financial support.  8145552965 ? [place] DSS Where to apply for food stamps, Medicaid and utility assistance. 305-222-8947 ? RCATS: Transportation to medical appointments. 613-598-4676 ? Social Security Administration: May apply for disability if have a Stage IV cancer. 401-037-8403 450-229-0916 ? [employer], Disability and Transit Services: Assists with nutrition, care and transit needs. [place] Support Programs: @10RELATIVEDAYS @ > Cancer Support Group  2nd Tuesday of the month 1pm-2pm, Journey Room  > Creative Journey  3rd Tuesday of the month 1130am-1pm, Journey Room  > Look Good Feel Better  1st Wednesday of the month 10am-12 noon, Journey Room (Call [place] to register [phone])

## 2016-05-08 LAB — TESTOSTERONE, FREE: Testosterone, Free: 0.4 pg/mL — ABNORMAL LOW (ref 6.6–18.1)

## 2016-05-08 LAB — TESTOSTERONE: Testosterone: <3 ng/dL — ABNORMAL LOW (ref 348–1197)

## 2016-05-09 LAB — URINE CULTURE

## 2016-05-11 LAB — TESTOSTERONE, % FREE: Testosterone-% Free: 0.7 % — ABNORMAL LOW (ref 0.2–0.7)

## 2016-06-04 ENCOUNTER — Encounter (HOSPITAL_COMMUNITY): Payer: Self-pay

## 2016-06-04 ENCOUNTER — Encounter (HOSPITAL_COMMUNITY): Payer: Medicare Other

## 2016-06-04 ENCOUNTER — Encounter (HOSPITAL_COMMUNITY): Payer: Medicare Other | Attending: Hematology

## 2016-06-04 VITALS — BP 149/64 | HR 66 | Temp 98.5°F | Resp 16

## 2016-06-04 DIAGNOSIS — C7951 Secondary malignant neoplasm of bone: Secondary | ICD-10-CM

## 2016-06-04 DIAGNOSIS — C61 Malignant neoplasm of prostate: Secondary | ICD-10-CM

## 2016-06-04 DIAGNOSIS — N50819 Testicular pain, unspecified: Secondary | ICD-10-CM

## 2016-06-04 LAB — COMPREHENSIVE METABOLIC PANEL
ALBUMIN: 4.1 g/dL (ref 3.5–5.0)
ALT: 19 U/L (ref 17–63)
AST: 29 U/L (ref 15–41)
Alkaline Phosphatase: 52 U/L (ref 38–126)
Anion gap: 7 (ref 5–15)
BUN: 13 mg/dL (ref 6–20)
CHLORIDE: 106 mmol/L (ref 101–111)
CO2: 25 mmol/L (ref 22–32)
CREATININE: 1.11 mg/dL (ref 0.61–1.24)
Calcium: 8.9 mg/dL (ref 8.9–10.3)
GFR calc Af Amer: >60 mL/min (ref >60)
GLUCOSE: 109 mg/dL — AB (ref 65–99)
POTASSIUM: 3.6 mmol/L (ref 3.5–5.1)
SODIUM: 138 mmol/L (ref 135–145)
Total Bilirubin: 0.4 mg/dL (ref 0.3–1.2)
Total Protein: 6.7 g/dL (ref 6.5–8.1)

## 2016-06-04 LAB — PSA: PSA: 0.19 ng/mL (ref 0.00–4.00)

## 2016-06-04 MED ORDER — DENOSUMAB 120 MG/1.7ML ~~LOC~~ SOLN
120.0000 mg | Freq: Once | SUBCUTANEOUS | Status: AC
  Administered 2016-06-04: 120 mg via SUBCUTANEOUS
  Filled 2016-06-04: qty 1.7

## 2016-06-04 NOTE — Patient Instructions (Signed)
[place] at [facility] Discharge Instructions  RECOMMENDATIONS MADE BY THE CONSULTANT AND ANY TEST RESULTS WILL BE SENT TO YOUR REFERRING PHYSICIAN.  Given Xgeva today. Follow up as scheduled.   Thank you for choosing [place] at [facility] to provide your oncology and hematology care.  To afford each patient quality time with our provider, please arrive at least 15 minutes before your scheduled appointment time.   Beginning January 23rd 2017 lab work for the [facility] will be done in the  Main lab at [facility] on 1st floor. If you have a lab appointment with the [place] please come in thru the  Main Entrance and check in at the main information desk  You need to re-schedule your appointment should you arrive 10 or more minutes late.  We strive to give you quality time with our providers, and arriving late affects you and other patients whose appointments are after yours.  Also, if you no show three or more times for appointments you may be dismissed from the clinic at the providers discretion.     Again, thank you for choosing [facility].  Our hope is that these requests will decrease the amount of time that you wait before being seen by our physicians.       _____________________________________________________________  Should you have questions after your visit to [facility], please contact our office at [phone] between the hours of 8:30 a.m. and 4:30 p.m.  Voicemails left after 4:30 p.m. will not be returned until the following business day.  For prescription refill requests, have your pharmacy contact our office.         Resources For Cancer Patients and their Caregivers ? American Cancer Society: Can assist with transportation, wigs, general needs, runs Look Good Feel Better.        [phone] ? Cancer Care: Provides financial assistance, online support groups, medication/co-pay  assistance.  1-800-813-HOPE 732-023-9199) ? [place] Assists [place] cancer patients and their families through emotional , educational and financial support.  805-576-3220 ? [place] DSS Where to apply for food stamps, Medicaid and utility assistance. 765-676-0117 ? RCATS: Transportation to medical appointments. 613-553-2489 ? Social Security Administration: May apply for disability if have a Stage IV cancer. 534-413-8087 [phone])832-7382 ? [employer], Disability and Transit Services: Assists with nutrition, care and transit needs. [place] Support Programs: @10RELATIVEDAYS @ > Cancer Support Group  2nd Tuesday of the month 1pm-2pm, Journey Room  > Creative Journey  3rd Tuesday of the month 1130am-1pm, Journey Room  > Look Good Feel Better  1st Wednesday of the month 10am-12 noon, Journey Room (Call [place] to register [phone])

## 2016-06-04 NOTE — Progress Notes (Signed)
Pt given Xgeva 120mg  SQ, in right lower abdomen. Pt tolerated well Follow up as scheduled

## 2016-06-05 LAB — TESTOSTERONE: Testosterone: 4 ng/dL — ABNORMAL LOW (ref 264–916)

## 2016-06-06 NOTE — Progress Notes (Signed)
This encounter was created in error - please disregard.

## 2016-07-01 ENCOUNTER — Other Ambulatory Visit (HOSPITAL_COMMUNITY): Payer: Self-pay

## 2016-07-01 DIAGNOSIS — C7951 Secondary malignant neoplasm of bone: Secondary | ICD-10-CM

## 2016-07-01 DIAGNOSIS — C61 Malignant neoplasm of prostate: Secondary | ICD-10-CM

## 2016-07-02 ENCOUNTER — Encounter (HOSPITAL_COMMUNITY): Payer: Medicare Other | Attending: Hematology & Oncology

## 2016-07-02 ENCOUNTER — Encounter (HOSPITAL_COMMUNITY): Payer: Medicare Other | Attending: Hematology

## 2016-07-02 VITALS — BP 155/68 | HR 80 | Temp 98.8°F | Resp 16

## 2016-07-02 DIAGNOSIS — C61 Malignant neoplasm of prostate: Secondary | ICD-10-CM

## 2016-07-02 DIAGNOSIS — C7951 Secondary malignant neoplasm of bone: Secondary | ICD-10-CM | POA: Diagnosis not present

## 2016-07-02 LAB — COMPREHENSIVE METABOLIC PANEL
ALBUMIN: 4.4 g/dL (ref 3.5–5.0)
ALK PHOS: 50 U/L (ref 38–126)
ALT: 25 U/L (ref 17–63)
ANION GAP: 7 (ref 5–15)
AST: 33 U/L (ref 15–41)
BUN: 11 mg/dL (ref 6–20)
CALCIUM: 9.6 mg/dL (ref 8.9–10.3)
CO2: 29 mmol/L (ref 22–32)
CREATININE: 1.1 mg/dL (ref 0.61–1.24)
Chloride: 102 mmol/L (ref 101–111)
GFR calc Af Amer: >60 mL/min (ref >60)
GFR calc non Af Amer: >60 mL/min (ref >60)
GLUCOSE: 94 mg/dL (ref 65–99)
Potassium: 4.1 mmol/L (ref 3.5–5.1)
SODIUM: 138 mmol/L (ref 135–145)
Total Bilirubin: 0.5 mg/dL (ref 0.3–1.2)
Total Protein: 6.9 g/dL (ref 6.5–8.1)

## 2016-07-02 LAB — CBC WITH DIFFERENTIAL/PLATELET
BASOS PCT: 0 %
Basophils Absolute: 0 10*3/uL (ref 0.0–0.1)
EOS ABS: 0.1 10*3/uL (ref 0.0–0.7)
Eosinophils Relative: 2 %
HCT: 39.1 % (ref 39.0–52.0)
HEMOGLOBIN: 13.1 g/dL (ref 13.0–17.0)
Lymphocytes Relative: 37 %
Lymphs Abs: 2.6 10*3/uL (ref 0.7–4.0)
MCH: 29.2 pg (ref 26.0–34.0)
MCHC: 33.5 g/dL (ref 30.0–36.0)
MCV: 87.3 fL (ref 78.0–100.0)
Monocytes Absolute: 0.3 10*3/uL (ref 0.1–1.0)
Monocytes Relative: 4 %
NEUTROS PCT: 57 %
Neutro Abs: 4 10*3/uL (ref 1.7–7.7)
Platelets: 137 10*3/uL — ABNORMAL LOW (ref 150–400)
RBC: 4.48 MIL/uL (ref 4.22–5.81)
RDW: 14.2 % (ref 11.5–15.5)
WBC: 7.1 10*3/uL (ref 4.0–10.5)

## 2016-07-02 LAB — PSA: PSA: 0.15 ng/mL (ref 0.00–4.00)

## 2016-07-02 MED ORDER — DENOSUMAB 120 MG/1.7ML ~~LOC~~ SOLN
120.0000 mg | Freq: Once | SUBCUTANEOUS | Status: AC
  Administered 2016-07-02: 120 mg via SUBCUTANEOUS
  Filled 2016-07-02: qty 1.7

## 2016-07-02 NOTE — Patient Instructions (Signed)
[facility] at [facility] Discharge Instructions  RECOMMENDATIONS MADE BY THE CONSULTANT AND ANY TEST RESULTS WILL BE SENT TO YOUR REFERRING PHYSICIAN.  Xgeva today.    Thank you for choosing [facility] at [facility] to provide your oncology and hematology care.  To afford each patient quality time with our provider, please arrive at least 15 minutes before your scheduled appointment time.   Beginning January 23rd 2017 lab work for the Cancer Center will be done in the  Main lab at [place] on 1st floor. If you have a lab appointment with the Cancer Center please come in thru the  Main Entrance and check in at the main information desk  You need to re-schedule your appointment should you arrive 10 or more minutes late.  We strive to give you quality time with our providers, and arriving late affects you and other patients whose appointments are after yours.  Also, if you no show three or more times for appointments you may be dismissed from the clinic at the providers discretion.     Again, thank you for choosing [facility].  Our hope is that these requests will decrease the amount of time that you wait before being seen by our physicians.       _____________________________________________________________  Should you have questions after your visit to [facility], please contact our office at [phone] between the hours of 8:30 a.m. and 4:30 p.m.  Voicemails left after 4:30 p.m. will not be returned until the following business day.  For prescription refill requests, have your pharmacy contact our office.         Resources For Cancer Patients and their Caregivers ? American Cancer Society: Can assist with transportation, wigs, general needs, runs Look Good Feel Better.        1-888-227-6333 ? Cancer Care: Provides financial assistance, online support groups, medication/co-pay assistance.  1-800-813-HOPE  (4673) ? [facility] Assists [place] cancer patients and their families through emotional , educational and financial support.  336-427-4357 ? [place] DSS Where to apply for food stamps, Medicaid and utility assistance. 336-342-1394 ? RCATS: Transportation to medical appointments. 336-347-2287 ? Social Security Administration: May apply for disability if have a Stage IV cancer. 336-342-7796 1-800-772-1213 ? [place] Aging, Disability and Transit Services: Assists with nutrition, care and transit needs. 336-349-2343  Cancer Center Support Programs: @10RELATIVEDAYS@ > Cancer Support Group  2nd Tuesday of the month 1pm-2pm, Journey Room  > Creative Journey  3rd Tuesday of the month 1130am-1pm, Journey Room  > Look Good Feel Better  1st Wednesday of the month 10am-12 noon, Journey Room (Call American Cancer Society to register [phone])    

## 2016-07-02 NOTE — Progress Notes (Signed)
[name] presents today for injection per MD orders. Xgeva administered SQ in left Abdomen. Administration without incident. Patient tolerated well.

## 2016-07-03 ENCOUNTER — Telehealth (HOSPITAL_COMMUNITY): Payer: Self-pay | Admitting: *Deleted

## 2016-07-03 LAB — TESTOSTERONE: Testosterone: <3 ng/dL — ABNORMAL LOW (ref 264–916)

## 2016-07-03 NOTE — Telephone Encounter (Signed)
Pt aware that labs are stable.  

## 2016-07-03 NOTE — Telephone Encounter (Signed)
-----   Message from [facility], PA-C sent at 07/03/2016  8:34 AM EDT ----- Stable

## 2016-07-08 DIAGNOSIS — I10 Essential (primary) hypertension: Secondary | ICD-10-CM | POA: Diagnosis not present

## 2016-07-08 DIAGNOSIS — M170 Bilateral primary osteoarthritis of knee: Secondary | ICD-10-CM | POA: Diagnosis not present

## 2016-07-08 DIAGNOSIS — M545 Low back pain: Secondary | ICD-10-CM | POA: Diagnosis not present

## 2016-07-18 DIAGNOSIS — C772 Secondary and unspecified malignant neoplasm of intra-abdominal lymph nodes: Secondary | ICD-10-CM | POA: Diagnosis not present

## 2016-07-18 DIAGNOSIS — C61 Malignant neoplasm of prostate: Secondary | ICD-10-CM | POA: Diagnosis not present

## 2016-07-24 ENCOUNTER — Ambulatory Visit (INDEPENDENT_AMBULATORY_CARE_PROVIDER_SITE_OTHER): Payer: Medicare Other | Admitting: Otolaryngology

## 2016-07-24 DIAGNOSIS — H903 Sensorineural hearing loss, bilateral: Secondary | ICD-10-CM

## 2016-07-28 ENCOUNTER — Other Ambulatory Visit (HOSPITAL_COMMUNITY): Payer: Self-pay | Admitting: *Deleted

## 2016-07-28 DIAGNOSIS — C7951 Secondary malignant neoplasm of bone: Secondary | ICD-10-CM

## 2016-07-28 DIAGNOSIS — C61 Malignant neoplasm of prostate: Secondary | ICD-10-CM

## 2016-07-30 ENCOUNTER — Other Ambulatory Visit (HOSPITAL_COMMUNITY): Payer: Medicare Other

## 2016-07-30 ENCOUNTER — Ambulatory Visit (HOSPITAL_COMMUNITY): Payer: Medicare Other | Admitting: Hematology & Oncology

## 2016-07-30 ENCOUNTER — Encounter (HOSPITAL_COMMUNITY): Payer: Medicare Other | Attending: Hematology

## 2016-07-30 DIAGNOSIS — C61 Malignant neoplasm of prostate: Secondary | ICD-10-CM | POA: Insufficient documentation

## 2016-07-30 DIAGNOSIS — C7951 Secondary malignant neoplasm of bone: Secondary | ICD-10-CM | POA: Insufficient documentation

## 2016-07-30 NOTE — Progress Notes (Signed)
This encounter was created in error - please disregard.

## 2016-08-06 ENCOUNTER — Encounter (HOSPITAL_BASED_OUTPATIENT_CLINIC_OR_DEPARTMENT_OTHER): Payer: Medicare Other

## 2016-08-06 ENCOUNTER — Encounter (HOSPITAL_COMMUNITY): Payer: Medicare Other | Attending: Hematology & Oncology

## 2016-08-06 ENCOUNTER — Encounter (HOSPITAL_COMMUNITY): Payer: Self-pay

## 2016-08-06 VITALS — BP 158/75 | HR 67 | Temp 98.7°F | Resp 18 | Wt 275.0 lb

## 2016-08-06 DIAGNOSIS — C7951 Secondary malignant neoplasm of bone: Secondary | ICD-10-CM

## 2016-08-06 DIAGNOSIS — Z23 Encounter for immunization: Secondary | ICD-10-CM

## 2016-08-06 DIAGNOSIS — C61 Malignant neoplasm of prostate: Secondary | ICD-10-CM | POA: Insufficient documentation

## 2016-08-06 LAB — PSA: PSA: 0.12 ng/mL (ref 0.00–4.00)

## 2016-08-06 LAB — COMPREHENSIVE METABOLIC PANEL
ALBUMIN: 4.2 g/dL (ref 3.5–5.0)
ALK PHOS: 48 U/L (ref 38–126)
ALT: 22 U/L (ref 17–63)
AST: 31 U/L (ref 15–41)
Anion gap: 4 — ABNORMAL LOW (ref 5–15)
BILIRUBIN TOTAL: 0.7 mg/dL (ref 0.3–1.2)
BUN: 10 mg/dL (ref 6–20)
CALCIUM: 9.7 mg/dL (ref 8.9–10.3)
CO2: 30 mmol/L (ref 22–32)
CREATININE: 1.09 mg/dL (ref 0.61–1.24)
Chloride: 104 mmol/L (ref 101–111)
GFR calc Af Amer: >60 mL/min (ref >60)
GFR calc non Af Amer: >60 mL/min (ref >60)
GLUCOSE: 98 mg/dL (ref 65–99)
Potassium: 4 mmol/L (ref 3.5–5.1)
SODIUM: 138 mmol/L (ref 135–145)
TOTAL PROTEIN: 6.9 g/dL (ref 6.5–8.1)

## 2016-08-06 LAB — CBC WITH DIFFERENTIAL/PLATELET
BASOS PCT: 0 %
Basophils Absolute: 0 10*3/uL (ref 0.0–0.1)
EOS ABS: 0.2 10*3/uL (ref 0.0–0.7)
Eosinophils Relative: 3 %
HEMATOCRIT: 37.7 % — AB (ref 39.0–52.0)
HEMOGLOBIN: 12.5 g/dL — AB (ref 13.0–17.0)
Lymphocytes Relative: 38 %
Lymphs Abs: 2.3 10*3/uL (ref 0.7–4.0)
MCH: 28.6 pg (ref 26.0–34.0)
MCHC: 33.2 g/dL (ref 30.0–36.0)
MCV: 86.3 fL (ref 78.0–100.0)
MONOS PCT: 5 %
Monocytes Absolute: 0.3 10*3/uL (ref 0.1–1.0)
NEUTROS ABS: 3.3 10*3/uL (ref 1.7–7.7)
NEUTROS PCT: 54 %
Platelets: 170 10*3/uL (ref 150–400)
RBC: 4.37 MIL/uL (ref 4.22–5.81)
RDW: 14 % (ref 11.5–15.5)
WBC: 6.1 10*3/uL (ref 4.0–10.5)

## 2016-08-06 MED ORDER — INFLUENZA VAC SPLIT QUAD 0.5 ML IM SUSY: 0.5000 mL | PREFILLED_SYRINGE | Freq: Once | INTRAMUSCULAR | Status: DC

## 2016-08-06 MED ORDER — DENOSUMAB 120 MG/1.7ML ~~LOC~~ SOLN
120.0000 mg | Freq: Once | SUBCUTANEOUS | Status: AC
  Administered 2016-08-06: 120 mg via SUBCUTANEOUS
  Filled 2016-08-06: qty 1.7

## 2016-08-06 MED ORDER — INFLUENZA VAC SPLIT QUAD 0.5 ML IM SUSY
PREFILLED_SYRINGE | INTRAMUSCULAR | Status: AC
  Filled 2016-08-06: qty 0.5

## 2016-08-06 NOTE — Progress Notes (Signed)
Moishe Spice presents today for injection per MD orders. Xgeva120mg  administered SQ in left Abdomen. Administration without incident. Patient tolerated well.   Vitals stable, discharged ambulatory, self. Follow up as scheduled.

## 2016-08-06 NOTE — Patient Instructions (Signed)
[facility] at [facility] Discharge Instructions  RECOMMENDATIONS MADE BY THE CONSULTANT AND ANY TEST RESULTS WILL BE SENT TO YOUR REFERRING PHYSICIAN.  Xgeva given today Follow up as scheduled  Thank you for choosing [facility] at [facility] to provide your oncology and hematology care.  To afford each patient quality time with our provider, please arrive at least 15 minutes before your scheduled appointment time.   Beginning January 23rd 2017 lab work for the Cancer Center will be done in the  Main lab at [place] on 1st floor. If you have a lab appointment with the Cancer Center please come in thru the  Main Entrance and check in at the main information desk  You need to re-schedule your appointment should you arrive 10 or more minutes late.  We strive to give you quality time with our providers, and arriving late affects you and other patients whose appointments are after yours.  Also, if you no show three or more times for appointments you may be dismissed from the clinic at the providers discretion.     Again, thank you for choosing [facility].  Our hope is that these requests will decrease the amount of time that you wait before being seen by our physicians.       _____________________________________________________________  Should you have questions after your visit to [facility], please contact our office at [phone] between the hours of 8:30 a.m. and 4:30 p.m.  Voicemails left after 4:30 p.m. will not be returned until the following business day.  For prescription refill requests, have your pharmacy contact our office.         Resources For Cancer Patients and their Caregivers ? American Cancer Society: Can assist with transportation, wigs, general needs, runs Look Good Feel Better.        1-888-227-6333 ? Cancer Care: Provides financial assistance, online support groups, medication/co-pay  assistance.  1-800-813-HOPE (4673) ? [facility] Assists [place] cancer patients and their families through emotional , educational and financial support.  336-427-4357 ? [place] DSS Where to apply for food stamps, Medicaid and utility assistance. 336-342-1394 ? RCATS: Transportation to medical appointments. 336-347-2287 ? Social Security Administration: May apply for disability if have a Stage IV cancer. 336-342-7796 1-800-772-1213 ? [place] Aging, Disability and Transit Services: Assists with nutrition, care and transit needs. 336-349-2343  Cancer Center Support Programs: @10RELATIVEDAYS@ > Cancer Support Group  2nd Tuesday of the month 1pm-2pm, Journey Room  > Creative Journey  3rd Tuesday of the month 1130am-1pm, Journey Room  > Look Good Feel Better  1st Wednesday of the month 10am-12 noon, Journey Room (Call American Cancer Society to register 1-800-395-5775)    

## 2016-08-07 LAB — TESTOSTERONE: TESTOSTERONE: 7 ng/dL — AB (ref 264–916)

## 2016-09-01 DIAGNOSIS — M545 Low back pain: Secondary | ICD-10-CM | POA: Diagnosis not present

## 2016-09-01 DIAGNOSIS — M170 Bilateral primary osteoarthritis of knee: Secondary | ICD-10-CM | POA: Diagnosis not present

## 2016-09-01 DIAGNOSIS — I10 Essential (primary) hypertension: Secondary | ICD-10-CM | POA: Diagnosis not present

## 2016-09-02 ENCOUNTER — Other Ambulatory Visit (HOSPITAL_COMMUNITY): Payer: Self-pay | Admitting: *Deleted

## 2016-09-02 DIAGNOSIS — C7951 Secondary malignant neoplasm of bone: Secondary | ICD-10-CM

## 2016-09-02 DIAGNOSIS — C61 Malignant neoplasm of prostate: Secondary | ICD-10-CM

## 2016-09-03 ENCOUNTER — Encounter (HOSPITAL_COMMUNITY): Payer: Self-pay | Admitting: Hematology & Oncology

## 2016-09-03 ENCOUNTER — Encounter (HOSPITAL_COMMUNITY): Payer: Medicare Other | Attending: Hematology & Oncology

## 2016-09-03 ENCOUNTER — Encounter (HOSPITAL_COMMUNITY): Payer: Medicare Other | Attending: Hematology

## 2016-09-03 ENCOUNTER — Encounter (HOSPITAL_COMMUNITY): Payer: Medicare Other | Attending: Hematology & Oncology | Admitting: Hematology & Oncology

## 2016-09-03 VITALS — BP 167/83 | HR 72 | Temp 98.2°F | Resp 18 | Wt 270.3 lb

## 2016-09-03 DIAGNOSIS — M8448XA Pathological fracture, other site, initial encounter for fracture: Secondary | ICD-10-CM | POA: Diagnosis not present

## 2016-09-03 DIAGNOSIS — C7951 Secondary malignant neoplasm of bone: Secondary | ICD-10-CM | POA: Diagnosis not present

## 2016-09-03 DIAGNOSIS — M7918 Myalgia, other site: Secondary | ICD-10-CM

## 2016-09-03 DIAGNOSIS — C61 Malignant neoplasm of prostate: Secondary | ICD-10-CM | POA: Insufficient documentation

## 2016-09-03 DIAGNOSIS — E669 Obesity, unspecified: Secondary | ICD-10-CM | POA: Diagnosis not present

## 2016-09-03 LAB — CBC WITH DIFFERENTIAL/PLATELET
BASOS PCT: 0 %
Basophils Absolute: 0 10*3/uL (ref 0.0–0.1)
EOS ABS: 0.3 10*3/uL (ref 0.0–0.7)
Eosinophils Relative: 4 %
HCT: 38.9 % — ABNORMAL LOW (ref 39.0–52.0)
HEMOGLOBIN: 13 g/dL (ref 13.0–17.0)
LYMPHS ABS: 2.6 10*3/uL (ref 0.7–4.0)
Lymphocytes Relative: 42 %
MCH: 28.8 pg (ref 26.0–34.0)
MCHC: 33.4 g/dL (ref 30.0–36.0)
MCV: 86.3 fL (ref 78.0–100.0)
MONO ABS: 0.4 10*3/uL (ref 0.1–1.0)
MONOS PCT: 6 %
NEUTROS PCT: 48 %
Neutro Abs: 2.9 10*3/uL (ref 1.7–7.7)
Platelets: 162 10*3/uL (ref 150–400)
RBC: 4.51 MIL/uL (ref 4.22–5.81)
RDW: 13.8 % (ref 11.5–15.5)
WBC: 6.2 10*3/uL (ref 4.0–10.5)

## 2016-09-03 LAB — COMPREHENSIVE METABOLIC PANEL
ALBUMIN: 4.4 g/dL (ref 3.5–5.0)
ALK PHOS: 51 U/L (ref 38–126)
ALT: 20 U/L (ref 17–63)
ANION GAP: 7 (ref 5–15)
AST: 29 U/L (ref 15–41)
BILIRUBIN TOTAL: 0.6 mg/dL (ref 0.3–1.2)
BUN: 15 mg/dL (ref 6–20)
CALCIUM: 10.3 mg/dL (ref 8.9–10.3)
CO2: 29 mmol/L (ref 22–32)
Chloride: 102 mmol/L (ref 101–111)
Creatinine, Ser: 1.09 mg/dL (ref 0.61–1.24)
GFR calc non Af Amer: >60 mL/min (ref >60)
GLUCOSE: 102 mg/dL — AB (ref 65–99)
POTASSIUM: 4 mmol/L (ref 3.5–5.1)
SODIUM: 138 mmol/L (ref 135–145)
TOTAL PROTEIN: 7.1 g/dL (ref 6.5–8.1)

## 2016-09-03 LAB — PSA: PSA: 0.1 ng/mL (ref 0.00–4.00)

## 2016-09-03 MED ORDER — DENOSUMAB 120 MG/1.7ML ~~LOC~~ SOLN
120.0000 mg | Freq: Once | SUBCUTANEOUS | Status: AC
  Administered 2016-09-03: 120 mg via SUBCUTANEOUS
  Filled 2016-09-03: qty 1.7

## 2016-09-03 NOTE — Progress Notes (Signed)
[name] Burly, MD  [place] / [place] [id number]  PROGRESS NOTE   DIAGNOSIS: Prostate cancer metastatic to bone   SUMMARY OF ONCOLOGIC HISTORY: Continued management of metastatic prostate cancer.  History of prostate cancer now metastatic  05/15/2009 Initial Diagnosis  Prostate biopsy by Dr. Michela Pitcher reveals prostate adenocarcinoma  07/09/2009 Surgery  Radical prostatectomy by Dr.Hemal at [facility] reveals T2, N0 Gleason score 7 adenocarcinoma with no extracapsular extension or seminal vesicle invasion and all margins were negative, 2 lymph nodes were negative  05/23/2010 - 06/05/2010 Radiation  61.2 gray administered to the prostatic bed by Dr. Lance Morin for a rising PSA level postoperatively  12/24/2015 Progression  PSA 10.3 with negative bone scan except for T8 traumatic vertebral body collapse but positive CT scan with abdominal lymphadenopathy and a peri-hepatic 2.2 cm nodule adjacent to the dome of the liver  12/27/2015 - Hormone Therapy  Lupron initiated along with one month bicalutamide therapy  Currently on Lupron and Xgeva   CURRENT THERAPY: Delton See Depo-Lupron  INTERVAL HISTORY: [name] 75 y.o. male returns for treatment of metastatic prostate cancer. Patient has a history of hypertension, obesity and metastatic prostate cancer with details as noted below. Prostate cancer as well as diagnosed in 2010 when he had robotic radical prostatectomy at [facility]. He however had a rising PSA postoperatively and was treated the following in with radiation therapy to his prostate bed. He was then lost to follow-up for a while considering he was cancer free. He went back to see Dr. Charlynne Cousins urologist] earlier in 2017 and his PSA was reevaluated and noted to be about 10. Bone scan was apparently negative for metastatic disease however he had an T8 compression fracture which was thought to be traumatic from being thrown off  a lawn mower. PET CT scan showed abdominal adenopathy and the perihepatic 2.2 cm nodule adjacent to the dome of the liver. He was started on Depo-Lupron and given his compression fracture from trauma and high risk of bone metastases was started on Xgeva. He has been following with Dr. Exie Parody for his every 3 month injection of Depo-Lupron. His PSA levels have since progressively dropped and were down to 0.2 on 04/04/2016.  Today [name] is unaccompanied.  He states that since he got a  Delton See) shot, he has been having back pain, neck pain, and aching arms. He also says he has weak legs. He says that he can walk fine.  His appetite is good. He states that he has gained some weight.  He says that he is told to walk, but he is "lazy" and hasn't been doing that.   His bowels are okay. He is on a stool softner. He is up to date with his colonoscopy. Dr. Britta Mccreedy in [place] did this.   He sees Dr. Exie Parody for his prostate.   Dr. Sherrie Sport is his PCP.   He is not currently taking any cholesterol medications. He does not see a cardiologist.   He takes calcium supplements.   He has received his flu shot and pneumonia vaccination. He also had his shingles vaccination.  He has no new localized symptoms, no new bone pain. A few hot flashes.  MEDICAL HISTORY: Past Medical History:  Diagnosis Date  . Cancer [facility])    prostate  . Hypertension   . Prostate cancer metastatic to bone ([place]) 04/04/2016    SURGICAL HISTORY: Past Surgical History:  Procedure Laterality Date  .  HAND SURGERY    . INGUINAL HERNIA REPAIR Left   . PROSTATECTOMY      SOCIAL HISTORY: Social History   Social History  . Marital status: Widowed    Spouse name: N/A  . Number of children: N/A  . Years of education: N/A   Occupational History  . Not on file.   Social History Main Topics  . Smoking status: Former Research scientist (life sciences)  . Smokeless tobacco: Never Used  . Alcohol use No  . Drug use: No  . Sexual activity: Not on file    Other Topics Concern  . Not on file   Social History Narrative  . No narrative on file    FAMILY HISTORY: Family History  Problem Relation Age of Onset  . Heart failure Mother   . Prostate cancer Father     Review of Systems  Constitutional:       Weight gain  HENT: Negative.   Eyes: Negative.   Respiratory: Negative.   Cardiovascular: Negative.   Gastrointestinal: Negative.        He is on a stool softener.   Genitourinary: Negative.   Musculoskeletal: Positive for back pain and neck pain.       Aching in arms. Neck pain, back pain, aching in arms from [name] shot.  Skin: Negative.   Neurological: Positive for weakness.       Weakness in legs from Xgeva shot  Endo/Heme/Allergies: Negative.   Psychiatric/Behavioral: Negative.   All other systems reviewed and are negative. 14 point review of systems was performed and is negative except as detailed under history of present illness and above   PHYSICAL EXAMINATION  ECOG PERFORMANCE STATUS: 1 - Symptomatic but completely ambulatory  Vitals:   09/03/16 1041  BP: (!) 167/83  Pulse: 72  Resp: 18  Temp: 98.2 F (36.8 C)    Physical Exam  Constitutional: He is oriented to person, place, and time and well-developed, well-nourished, and in no distress.  Patient was able to get on exam table without assistance.   HENT:  Head: Normocephalic and atraumatic.  Nose: Nose normal.  Mouth/Throat: Oropharynx is clear and moist. No oropharyngeal exudate.  Eyes: Conjunctivae and EOM are normal. Pupils are equal, round, and reactive to light. Right eye exhibits no discharge. Left eye exhibits no discharge. No scleral icterus.  Neck: Normal range of motion. Neck supple. No tracheal deviation present. No thyromegaly present.  Cardiovascular: Normal rate, regular rhythm and normal heart sounds.  Exam reveals no gallop and no friction rub.   No murmur heard. Pulmonary/Chest: Effort normal and breath sounds normal. He has no  wheezes. He has no rales.  Abdominal: Soft. Bowel sounds are normal. He exhibits no distension and no mass. There is no tenderness. There is no rebound and no guarding.  Musculoskeletal: Normal range of motion. He exhibits no edema.  Lymphadenopathy:    He has no cervical adenopathy.  Neurological: He is alert and oriented to person, place, and time. He has normal reflexes. No cranial nerve deficit. Gait normal. Coordination normal.  Skin: Skin is warm and dry. No rash noted.  Psychiatric: Mood, memory, affect and judgment normal.  Nursing note and vitals reviewed.   LABORATORY DATA:  CBC    Component Value Date/Time   WBC 6.2 09/03/2016 1011   RBC 4.51 09/03/2016 1011   HGB 13.0 09/03/2016 1011   HCT 38.9 (L) 09/03/2016 1011   PLT 162 09/03/2016 1011   MCV 86.3 09/03/2016 1011   MCH 28.8 09/03/2016  1011   MCHC 33.4 09/03/2016 1011   RDW 13.8 09/03/2016 1011   LYMPHSABS 2.6 09/03/2016 1011   MONOABS 0.4 09/03/2016 1011   EOSABS 0.3 09/03/2016 1011   BASOSABS 0.0 09/03/2016 1011    CMP     Component Value Date/Time   NA 138 09/03/2016 1011   K 4.0 09/03/2016 1011   CL 102 09/03/2016 1011   CO2 29 09/03/2016 1011   GLUCOSE 102 (H) 09/03/2016 1011   BUN 15 09/03/2016 1011   CREATININE 1.09 09/03/2016 1011   CALCIUM 10.3 09/03/2016 1011   PROT 7.1 09/03/2016 1011   ALBUMIN 4.4 09/03/2016 1011   AST 29 09/03/2016 1011   ALT 20 09/03/2016 1011   ALKPHOS 51 09/03/2016 1011   BILITOT 0.6 09/03/2016 1011   GFRNONAA >60 09/03/2016 1011   GFRAA >60 09/03/2016 1011    Results for Kaser, [name] (MRN [id number])   Ref. Range 05/07/2016 14:09 06/04/2016 13:15 07/02/2016 13:25 08/06/2016 10:49 09/03/2016 10:11  PSA Latest Ref Range: 0.00 - 4.00 ng/mL 0.16 0.19 0.15 0.12 0.10    RADIOGRAPHIC STUDIES:  No results found.      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 Exie Parody his urologist -Continue Delton See every 4 weeks -Counseled to continue calcium and vitamin D replacement.. -Counseled to maintain active lifestyle with appropriate diet. -Continue follow-up with primary care physician -Repeat imaging as necessary if new symptoms or increase in PSA levels.  We discussed that XGEVA can cause musculoskeletal pain. If this persists we will re-address ongoing therapy.  We discussed him staying active, even if he doesn't feel like it. I told him about [employer] at the [facility].  Orders Placed This Encounter  Procedures  . CBC with Differential    Standing Status:   Future    Standing Expiration Date:   09/03/2017  . Comprehensive metabolic panel    Standing Status:   Future    Standing Expiration Date:   09/03/2017  . PSA    Standing Status:   Future    Standing Expiration Date:   09/03/2017  . VITAMIN D 25 Hydroxy (Vit-D Deficiency, Fractures)    Standing Status:   Future    Standing Expiration Date:   09/03/2017    All questions were answered. The patient knows to call the clinic with any problems, questions or concerns. We can certainly see the patient much sooner if necessary. This note was electronically signed.  This document serves as a record of services personally performed by Ancil Linsey, MD. It was created on her behalf by Martinique Casey, a trained medical scribe. The creation of this record is based on the scribe's personal observations and the provider's statements to them. This document has been checked and approved by the attending provider.  I have reviewed the above documentation for accuracy and completeness and I agree with the above.  Molli Hazard, MD  09/03/2016

## 2016-09-03 NOTE — Progress Notes (Signed)
[name] presents today for injection per MD orders. Xgeva 120 mg administered SQ in left Abdomen. Administration without incident. Patient tolerated well.  

## 2016-09-03 NOTE — Patient Instructions (Addendum)
[place] at [facility] Discharge Instructions  RECOMMENDATIONS MADE BY THE CONSULTANT AND ANY TEST RESULTS WILL BE SENT TO YOUR REFERRING PHYSICIAN.  You saw Dr.Penland today. Continue Xgeva. Follow up in 3 months with lab work.  Look into the [facility] Program at the [facility]  See Amy at checkout for appointments.  Thank you for choosing [place] at [facility] to provide your oncology and hematology care.  To afford each patient quality time with our provider, please arrive at least 15 minutes before your scheduled appointment time.   Beginning January 23rd 2017 lab work for the [facility] will be done in the  Main lab at [facility] on 1st floor. If you have a lab appointment with the [facility] please come in thru the  Main Entrance and check in at the main information desk  You need to re-schedule your appointment should you arrive 10 or more minutes late.  We strive to give you quality time with our providers, and arriving late affects you and other patients whose appointments are after yours.  Also, if you no show three or more times for appointments you may be dismissed from the clinic at the providers discretion.     Again, thank you for choosing [facility].  Our hope is that these requests will decrease the amount of time that you wait before being seen by our physicians.       _____________________________________________________________  Should you have questions after your visit to [facility], please contact our office at [phone] between the hours of 8:30 a.m. and 4:30 p.m.  Voicemails left after 4:30 p.m. will not be returned until the following business day.  For prescription refill requests, have your pharmacy contact our office.         Resources For Cancer Patients and their Caregivers ? American Cancer Society: Can assist with transportation, wigs, general needs, runs Look Good  Feel Better.        (631)212-0241 ? Cancer Care: Provides financial assistance, online support groups, medication/co-pay assistance.  1-800-813-HOPE 859-825-7469) ? [place] Assists [place] cancer patients and their families through emotional , educational and financial support.  913-300-9602 ? [place] DSS Where to apply for food stamps, Medicaid and utility assistance. 623-366-3033 ? RCATS: Transportation to medical appointments. (873) 495-4533 ? Social Security Administration: May apply for disability if have a Stage IV cancer. 435-576-5995 (775)304-1696 ? [employer], Disability and Transit Services: Assists with nutrition, care and transit needs. [place] Support Programs: @10RELATIVEDAYS @ > Cancer Support Group  2nd Tuesday of the month 1pm-2pm, Journey Room  > Creative Journey  3rd Tuesday of the month 1130am-1pm, Journey Room  > Look Good Feel Better  1st Wednesday of the month 10am-12 noon, Journey Room (Call [place] to register [phone])

## 2016-09-04 LAB — TESTOSTERONE: Testosterone: 4 ng/dL — ABNORMAL LOW (ref 264–916)

## 2016-09-09 DIAGNOSIS — M170 Bilateral primary osteoarthritis of knee: Secondary | ICD-10-CM | POA: Diagnosis not present

## 2016-09-09 DIAGNOSIS — M545 Low back pain: Secondary | ICD-10-CM | POA: Diagnosis not present

## 2016-09-09 DIAGNOSIS — N400 Enlarged prostate without lower urinary tract symptoms: Secondary | ICD-10-CM | POA: Diagnosis not present

## 2016-09-09 DIAGNOSIS — I10 Essential (primary) hypertension: Secondary | ICD-10-CM | POA: Diagnosis not present

## 2016-09-22 ENCOUNTER — Ambulatory Visit (INDEPENDENT_AMBULATORY_CARE_PROVIDER_SITE_OTHER): Payer: Medicare Other | Admitting: Otolaryngology

## 2016-09-22 DIAGNOSIS — H903 Sensorineural hearing loss, bilateral: Secondary | ICD-10-CM | POA: Diagnosis not present

## 2016-09-30 ENCOUNTER — Other Ambulatory Visit (HOSPITAL_COMMUNITY): Payer: Self-pay

## 2016-09-30 DIAGNOSIS — C61 Malignant neoplasm of prostate: Secondary | ICD-10-CM | POA: Diagnosis not present

## 2016-09-30 DIAGNOSIS — C7951 Secondary malignant neoplasm of bone: Secondary | ICD-10-CM

## 2016-10-01 ENCOUNTER — Other Ambulatory Visit (HOSPITAL_COMMUNITY): Payer: Medicare Other

## 2016-10-01 ENCOUNTER — Ambulatory Visit (HOSPITAL_COMMUNITY): Payer: Medicare Other

## 2016-10-02 DIAGNOSIS — C7951 Secondary malignant neoplasm of bone: Secondary | ICD-10-CM | POA: Diagnosis not present

## 2016-10-02 DIAGNOSIS — C61 Malignant neoplasm of prostate: Secondary | ICD-10-CM | POA: Diagnosis not present

## 2016-10-06 DIAGNOSIS — C61 Malignant neoplasm of prostate: Secondary | ICD-10-CM | POA: Diagnosis not present

## 2016-10-13 ENCOUNTER — Other Ambulatory Visit (HOSPITAL_COMMUNITY): Payer: Self-pay | Admitting: *Deleted

## 2016-10-14 ENCOUNTER — Encounter (HOSPITAL_COMMUNITY): Payer: Medicare Other | Attending: Hematology & Oncology

## 2016-10-14 ENCOUNTER — Encounter (HOSPITAL_COMMUNITY): Payer: Medicare Other

## 2016-10-14 VITALS — BP 140/75 | HR 71 | Temp 97.9°F | Resp 18

## 2016-10-14 DIAGNOSIS — C61 Malignant neoplasm of prostate: Secondary | ICD-10-CM | POA: Insufficient documentation

## 2016-10-14 DIAGNOSIS — C7951 Secondary malignant neoplasm of bone: Secondary | ICD-10-CM

## 2016-10-14 LAB — COMPREHENSIVE METABOLIC PANEL
ALBUMIN: 4.4 g/dL (ref 3.5–5.0)
ALT: 21 U/L (ref 17–63)
ANION GAP: 6 (ref 5–15)
AST: 32 U/L (ref 15–41)
Alkaline Phosphatase: 52 U/L (ref 38–126)
BILIRUBIN TOTAL: 0.8 mg/dL (ref 0.3–1.2)
BUN: 15 mg/dL (ref 6–20)
CO2: 28 mmol/L (ref 22–32)
Calcium: 9.3 mg/dL (ref 8.9–10.3)
Chloride: 103 mmol/L (ref 101–111)
Creatinine, Ser: 1.11 mg/dL (ref 0.61–1.24)
GFR calc Af Amer: >60 mL/min (ref >60)
GFR calc non Af Amer: >60 mL/min (ref >60)
GLUCOSE: 107 mg/dL — AB (ref 65–99)
POTASSIUM: 4.4 mmol/L (ref 3.5–5.1)
SODIUM: 137 mmol/L (ref 135–145)
TOTAL PROTEIN: 7.2 g/dL (ref 6.5–8.1)

## 2016-10-14 MED ORDER — DENOSUMAB 120 MG/1.7ML ~~LOC~~ SOLN
120.0000 mg | Freq: Once | SUBCUTANEOUS | Status: AC
  Administered 2016-10-14: 120 mg via SUBCUTANEOUS
  Filled 2016-10-14: qty 1.7

## 2016-10-14 NOTE — Progress Notes (Unsigned)
[name] tolerated Xgeva injection well without complaints or incident. Labs reviewed and pt denied any jaw,tooth or leg pain prior to administering Xgeva.VSS Pt discharged self ambulatory in satisfactory condition

## 2016-10-14 NOTE — Patient Instructions (Signed)
[facility] at [facility] Discharge Instructions  RECOMMENDATIONS MADE BY THE CONSULTANT AND ANY TEST RESULTS WILL BE SENT TO YOUR REFERRING PHYSICIAN.  Received Xgeva injection today. Follow-up as scheduled. Call clinic for any questions or concerns  Thank you for choosing [facility] at [facility] to provide your oncology and hematology care.  To afford each patient quality time with our provider, please arrive at least 15 minutes before your scheduled appointment time.   Beginning January 23rd 2017 lab work for the Cancer Center will be done in the  Main lab at [place] on 1st floor. If you have a lab appointment with the Cancer Center please come in thru the  Main Entrance and check in at the main information desk  You need to re-schedule your appointment should you arrive 10 or more minutes late.  We strive to give you quality time with our providers, and arriving late affects you and other patients whose appointments are after yours.  Also, if you no show three or more times for appointments you may be dismissed from the clinic at the providers discretion.     Again, thank you for choosing [facility].  Our hope is that these requests will decrease the amount of time that you wait before being seen by our physicians.       _____________________________________________________________  Should you have questions after your visit to [facility], please contact our office at [phone] between the hours of 8:30 a.m. and 4:30 p.m.  Voicemails left after 4:30 p.m. will not be returned until the following business day.  For prescription refill requests, have your pharmacy contact our office.         Resources For Cancer Patients and their Caregivers ? American Cancer Society: Can assist with transportation, wigs, general needs, runs Look Good Feel Better.        1-888-227-6333 ? Cancer Care: Provides financial  assistance, online support groups, medication/co-pay assistance.  1-800-813-HOPE (4673) ? [facility] Assists [place] cancer patients and their families through emotional , educational and financial support.  336-427-4357 ? [place] DSS Where to apply for food stamps, Medicaid and utility assistance. 336-342-1394 ? RCATS: Transportation to medical appointments. 336-347-2287 ? Social Security Administration: May apply for disability if have a Stage IV cancer. 336-342-7796 1-800-772-1213 ? [place] Aging, Disability and Transit Services: Assists with nutrition, care and transit needs. 336-349-2343  Cancer Center Support Programs: @10RELATIVEDAYS@ > Cancer Support Group  2nd Tuesday of the month 1pm-2pm, Journey Room  > Creative Journey  3rd Tuesday of the month 1130am-1pm, Journey Room  > Look Good Feel Better  1st Wednesday of the month 10am-12 noon, Journey Room (Call American Cancer Society to register 1-800-395-5775)   

## 2016-10-21 ENCOUNTER — Encounter: Payer: Self-pay | Admitting: Family Medicine

## 2016-10-21 ENCOUNTER — Ambulatory Visit: Payer: Medicare Other | Admitting: Family Medicine

## 2016-10-24 ENCOUNTER — Ambulatory Visit (INDEPENDENT_AMBULATORY_CARE_PROVIDER_SITE_OTHER): Payer: Medicare Other | Admitting: Family Medicine

## 2016-10-24 ENCOUNTER — Encounter (INDEPENDENT_AMBULATORY_CARE_PROVIDER_SITE_OTHER): Payer: Self-pay

## 2016-10-24 ENCOUNTER — Encounter: Payer: Self-pay | Admitting: Family Medicine

## 2016-10-24 VITALS — BP 138/80 | HR 68 | Temp 98.6°F | Resp 20 | Ht 69.0 in | Wt 275.1 lb

## 2016-10-24 DIAGNOSIS — G4730 Sleep apnea, unspecified: Secondary | ICD-10-CM

## 2016-10-24 DIAGNOSIS — G8929 Other chronic pain: Secondary | ICD-10-CM

## 2016-10-24 DIAGNOSIS — H9193 Unspecified hearing loss, bilateral: Secondary | ICD-10-CM

## 2016-10-24 DIAGNOSIS — E785 Hyperlipidemia, unspecified: Secondary | ICD-10-CM | POA: Diagnosis not present

## 2016-10-24 DIAGNOSIS — K649 Unspecified hemorrhoids: Secondary | ICD-10-CM | POA: Insufficient documentation

## 2016-10-24 DIAGNOSIS — M47816 Spondylosis without myelopathy or radiculopathy, lumbar region: Secondary | ICD-10-CM

## 2016-10-24 DIAGNOSIS — Z7689 Persons encountering health services in other specified circumstances: Secondary | ICD-10-CM | POA: Diagnosis not present

## 2016-10-24 DIAGNOSIS — R400 Somnolence: Secondary | ICD-10-CM

## 2016-10-24 DIAGNOSIS — M545 Low back pain: Secondary | ICD-10-CM

## 2016-10-24 DIAGNOSIS — H9190 Unspecified hearing loss, unspecified ear: Secondary | ICD-10-CM | POA: Insufficient documentation

## 2016-10-24 DIAGNOSIS — M150 Primary generalized (osteo)arthritis: Secondary | ICD-10-CM | POA: Diagnosis not present

## 2016-10-24 DIAGNOSIS — M549 Dorsalgia, unspecified: Secondary | ICD-10-CM

## 2016-10-24 DIAGNOSIS — I10 Essential (primary) hypertension: Secondary | ICD-10-CM | POA: Insufficient documentation

## 2016-10-24 DIAGNOSIS — K219 Gastro-esophageal reflux disease without esophagitis: Secondary | ICD-10-CM

## 2016-10-24 DIAGNOSIS — K279 Peptic ulcer, site unspecified, unspecified as acute or chronic, without hemorrhage or perforation: Secondary | ICD-10-CM | POA: Insufficient documentation

## 2016-10-24 HISTORY — DX: Sleep apnea, unspecified: G47.30

## 2016-10-24 MED ORDER — TRAMADOL HCL 50 MG PO TABS: 50.0000 mg | ORAL_TABLET | Freq: Four times a day (QID) | ORAL | 0 refills | Status: DC | PRN

## 2016-10-24 MED ORDER — LUMBAR BACK BRACE/SUPPORT PAD MISC: 1.0000 [IU] | Freq: Every day | 0 refills | Status: DC | PRN

## 2016-10-24 NOTE — Patient Instructions (Signed)
Need records from Dr Sherrie Sport Need records [facility] urology  Continue on your current medicines  Try to walk every day that you are able  We will see about getting you a back brace for lifting and work  See me in a month when you come back for your cancer treatment

## 2016-10-24 NOTE — Progress Notes (Signed)
Chief Complaint  Patient presents with  . Establish Care   New to establish No records available from PCP patient is a poor historian, also  Hard of hearing  Longstanding hypertension, controlled Longstanding high cholesterol, not on medication Denies cardiac or vascular disease Prior smoker, quit "50 years ago" and denies lung disease  Metastatic prostate cancer treated by urology and oncology  History of GERD and PUD on omeprazole 40 mg a day with control of symptoms.  Of concern is a prescription for mobic for arthritis pain.  Denies GI distress or heartburn.  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  Currently on tramadol TID and tizanidine BID prn.  Is on ritalin for over 10 years for daytime fatigue from sleep disorder.  Had tonsils and uvula removed to treat.  Unknown last sleep study.  Does not use CPAP or oxygen.  Will try to get old records.    Patient Active Problem List   Diagnosis Date Noted  . Morbid obesity ([place]) 10/24/2016  . Essential hypertension 10/24/2016  . HLD (hyperlipidemia) 10/24/2016  . 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 Chronic back pain 10/24/2016  . PUD (peptic ulcer disease) 10/24/2016  . GERD (gastroesophageal reflux disease) 10/24/2016  . Hemorrhoids 10/24/2016  . Sleep apnea 10/24/2016  . Uncontrolled daytime somnolence 10/24/2016  . HOH (hard of hearing) 10/24/2016  . Testicular discomfort 05/07/2016  . Prostate cancer metastatic to bone ([place]) 04/04/2016    Outpatient Encounter Prescriptions as of 10/24/2016  Medication Sig  . benazepril (LOTENSIN) 20 MG tablet Take 20 mg by mouth daily.  . Calcium Carb-Cholecalciferol (CALCIUM-VITAMIN D) 500-200 MG-UNIT tablet Take 1 tablet by mouth daily.  . cetirizine (ZYRTEC) 10 MG tablet Take 10 mg by mouth daily.  . meloxicam (MOBIC) 7.5 MG tablet Take 7.5 mg by mouth daily.  . methylphenidate (RITALIN)  20 MG tablet Take 20 mg by mouth daily.  Marland Kitchen omega-3 acid ethyl esters (LOVAZA) 1 g capsule Take 2 g by mouth 2 (two) times daily.  Marland Kitchen omeprazole (PRILOSEC) 40 MG capsule Take 40 mg by mouth daily.  Marland Kitchen tiZANidine (ZANAFLEX) 4 MG capsule Take 4 mg by mouth 2 (two) times daily.  . traMADol (ULTRAM) 50 MG tablet Take 1 tablet (50 mg total) by mouth every 6 (six) hours as needed. Pain  . hydrochlorothiazide (HYDRODIURIL) 25 MG tablet Take 25 mg by mouth daily.   No facility-administered encounter medications on file as of 10/24/2016.     Past Medical History:  Diagnosis Date  . Cancer [facility])    prostate  . GERD (gastroesophageal reflux disease)   . Hyperlipidemia   . Hypertension   . Prostate cancer metastatic to bone ([place]) 04/04/2016  . Sleep apnea 10/24/2016    Past Surgical History:  Procedure Laterality Date  . HAND SURGERY    . INGUINAL HERNIA REPAIR right   . PROSTATECTOMY    . radical prostatectomy    . TONSILLECTOMY      Social History   Social History  . Marital status: Widowed    Spouse name: N/A  . Number of children: 3  . Years of education: 9   Occupational History  . handyman- retired     [employer]   Social History Main Topics  . Smoking status: Former Research scientist (life sciences)  . Smokeless tobacco: Never Used  . Alcohol use No  . Drug use: No  . Sexual activity: Not Currently  Other Topics Concern  . Not on file   Social History Narrative   Widowed   [place] with son   Chronic low back pain    Family History  Problem Relation Age of Onset  . Heart failure Mother     CHF  . Prostate cancer Father     Review of Systems  Constitutional: Negative for chills, fever and weight loss.  HENT: Positive for hearing loss. Negative for congestion and sinus pain.        Hoarse  Eyes: Negative for blurred vision and pain.  Respiratory: Negative for cough and shortness of breath.   Cardiovascular: Negative for chest pain, palpitations and leg swelling.    Gastrointestinal: Negative for abdominal pain, blood in stool, constipation, diarrhea and heartburn.  Genitourinary: Positive for frequency. Negative for dysuria.       Some bladder control problems  Musculoskeletal: Positive for back pain. Negative for falls, joint pain and myalgias.  Skin: Positive for rash.       Concern about skin and hair on face - vague  Neurological: Negative for dizziness, seizures and headaches.  Psychiatric/Behavioral: Negative for depression and memory loss. The patient is not nervous/anxious and does not have insomnia.     BP 138/80   Pulse 68   Temp 98.6 F (37 C) (Oral)   Resp 20   Ht 5\' 9"  (1.753 m)   Wt 275 lb 1.9 oz (124.8 kg)   SpO2 98%   BMI 40.63 kg/m   Physical Exam  Constitutional: He is oriented to person, place, and time. He appears well-developed and well-nourished.  obese  HENT:  Head: Normocephalic and atraumatic.  Right Ear: External ear normal.  Left Ear: External ear normal.  Mouth/Throat: Oropharynx is clear and moist.  Dentures.  Hearing aids  Eyes: Conjunctivae are normal. Pupils are equal, round, and reactive to light.  glasses  Neck: Normal range of motion. Neck supple. No thyromegaly present.  Cardiovascular: Normal rate, regular rhythm and normal heart sounds.   occas ectopic beat  Pulmonary/Chest: Effort normal and breath sounds normal. No respiratory distress.  Abdominal: Soft. Bowel sounds are normal.  Musculoskeletal: Normal range of motion. He exhibits edema.  R index finger partial amputation.  Pedal edema 1+  Lymphadenopathy:    He has no cervical adenopathy.  Neurological: He is alert and oriented to person, place, and time.  Gait normal  Skin: Skin is warm and dry. No rash noted.  Psychiatric: He has a normal mood and affect. His behavior is normal. Thought content normal.  Poor fund of knowledge.  Nursing note and vitals reviewed.  ASSESSMENT/PLAN:  1. Morbid obesity (HCC) Discussed diet, portions,  snacks.  Daily walking recommended. 2. Essential hypertension controlled  3. Hyperlipidemia, unspecified hyperlipidemia type Check lipids  4. Osteoarthritis of lumbar spine, unspecified spinal osteoarthritis complication status Continue tramadol.  Will see about discontinuing mobic next visit.  Will change tizanidine to prn.  5. Chronic midline low back pain without sciatica   6. Gastroesophageal reflux disease, esophagitis presence not specified Will check vitamin D and B12 since on high dose PPI  7. Sleep apnea, unspecified type Need records.  I am generally uncomfortable using Ritalin in geriatric patient.  Prefer consult about sleep/narcolepsy.  8. Uncontrolled daytime somnolence   9. Encounter to establish care with new doctor Will get records  10. Bilateral hearing loss, unspecified hearing loss type Contributes to communication difficulty with this patient.  Patient Instructions  Need records from Dr Sherrie Sport Need  records [facility] urology  Continue on your current medicines  Try to walk every day that you are able  We will see about getting you a back brace for lifting and work  See me in a month when you come back for your cancer treatment      [name], MD

## 2016-10-29 ENCOUNTER — Ambulatory Visit (HOSPITAL_COMMUNITY): Payer: Medicare Other

## 2016-10-29 ENCOUNTER — Other Ambulatory Visit (HOSPITAL_COMMUNITY): Payer: Medicare Other

## 2016-10-29 DIAGNOSIS — C61 Malignant neoplasm of prostate: Secondary | ICD-10-CM | POA: Diagnosis not present

## 2016-11-11 ENCOUNTER — Ambulatory Visit (INDEPENDENT_AMBULATORY_CARE_PROVIDER_SITE_OTHER): Payer: Medicare Other | Admitting: Family Medicine

## 2016-11-11 ENCOUNTER — Encounter (HOSPITAL_COMMUNITY): Payer: Medicare Other | Attending: Hematology & Oncology

## 2016-11-11 ENCOUNTER — Encounter (HOSPITAL_COMMUNITY): Payer: Medicare Other

## 2016-11-11 ENCOUNTER — Encounter: Payer: Self-pay | Admitting: Family Medicine

## 2016-11-11 VITALS — BP 136/78 | HR 68 | Temp 98.4°F | Resp 20 | Ht 69.0 in | Wt 275.1 lb

## 2016-11-11 DIAGNOSIS — K219 Gastro-esophageal reflux disease without esophagitis: Secondary | ICD-10-CM | POA: Diagnosis not present

## 2016-11-11 DIAGNOSIS — C7951 Secondary malignant neoplasm of bone: Secondary | ICD-10-CM

## 2016-11-11 DIAGNOSIS — E785 Hyperlipidemia, unspecified: Secondary | ICD-10-CM

## 2016-11-11 DIAGNOSIS — I10 Essential (primary) hypertension: Secondary | ICD-10-CM

## 2016-11-11 DIAGNOSIS — C61 Malignant neoplasm of prostate: Secondary | ICD-10-CM

## 2016-11-11 LAB — CBC WITH DIFFERENTIAL/PLATELET
Basophils Absolute: 0 10*3/uL (ref 0.0–0.1)
Basophils Relative: 0 %
EOS ABS: 0.2 10*3/uL (ref 0.0–0.7)
Eosinophils Relative: 3 %
HCT: 36.9 % — ABNORMAL LOW (ref 39.0–52.0)
HEMOGLOBIN: 12.8 g/dL — AB (ref 13.0–17.0)
LYMPHS ABS: 2.6 10*3/uL (ref 0.7–4.0)
LYMPHS PCT: 43 %
MCH: 29.8 pg (ref 26.0–34.0)
MCHC: 34.7 g/dL (ref 30.0–36.0)
MCV: 86 fL (ref 78.0–100.0)
MONOS PCT: 7 %
Monocytes Absolute: 0.4 10*3/uL (ref 0.1–1.0)
NEUTROS PCT: 47 %
Neutro Abs: 2.8 10*3/uL (ref 1.7–7.7)
Platelets: 156 10*3/uL (ref 150–400)
RBC: 4.29 MIL/uL (ref 4.22–5.81)
RDW: 14 % (ref 11.5–15.5)
WBC: 6 10*3/uL (ref 4.0–10.5)

## 2016-11-11 LAB — COMPREHENSIVE METABOLIC PANEL
ALK PHOS: 49 U/L (ref 38–126)
ALT: 22 U/L (ref 17–63)
ANION GAP: 8 (ref 5–15)
AST: 28 U/L (ref 15–41)
Albumin: 4.1 g/dL (ref 3.5–5.0)
BILIRUBIN TOTAL: 0.6 mg/dL (ref 0.3–1.2)
BUN: 11 mg/dL (ref 6–20)
CALCIUM: 8.7 mg/dL — AB (ref 8.9–10.3)
CO2: 28 mmol/L (ref 22–32)
CREATININE: 1.18 mg/dL (ref 0.61–1.24)
Chloride: 101 mmol/L (ref 101–111)
GFR, EST NON AFRICAN AMERICAN: 58 mL/min — AB (ref >60)
Glucose, Bld: 73 mg/dL (ref 65–99)
Potassium: 3.7 mmol/L (ref 3.5–5.1)
SODIUM: 137 mmol/L (ref 135–145)
TOTAL PROTEIN: 6.7 g/dL (ref 6.5–8.1)

## 2016-11-11 LAB — PSA: PSA: 0.1 ng/mL (ref 0.00–4.00)

## 2016-11-11 MED ORDER — BENAZEPRIL HCL 20 MG PO TABS: 20.0000 mg | ORAL_TABLET | Freq: Every day | ORAL | 3 refills | Status: DC

## 2016-11-11 MED ORDER — HYDROCHLOROTHIAZIDE 25 MG PO TABS: 25.0000 mg | ORAL_TABLET | Freq: Every day | ORAL | 3 refills | Status: DC

## 2016-11-11 MED ORDER — CETIRIZINE HCL 10 MG PO TABS: 10.0000 mg | ORAL_TABLET | Freq: Every day | ORAL | 3 refills | Status: DC

## 2016-11-11 NOTE — Progress Notes (Signed)
Chief Complaint  Patient presents with  . Follow-up    1 month   Follow up No new complaint Needs refills "bladder leaks" Under care urology for prostate cancer Back pain comes and goes  Discussed weight and need for increased activity and less calories - "push back from the table"  No GERD on medicine  Patient Active Problem List   Diagnosis Date Noted  . Morbid obesity ([place]) 10/24/2016  . Essential hypertension 10/24/2016  . HLD (hyperlipidemia) 10/24/2016  . 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 Chronic back pain 10/24/2016  . PUD (peptic ulcer disease) 10/24/2016  . GERD (gastroesophageal reflux disease) 10/24/2016  . Hemorrhoids 10/24/2016  . Sleep apnea 10/24/2016  . Uncontrolled daytime somnolence 10/24/2016  . HOH (hard of hearing) 10/24/2016  . Testicular discomfort 05/07/2016  . 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 benazepril (LOTENSIN) 20 MG tablet Take 1 tablet (20 mg total) by mouth daily.  . Calcium Carb-Cholecalciferol (CALCIUM-VITAMIN D) 500-200 MG-UNIT tablet Take 1 tablet by mouth daily.  . cetirizine (ZYRTEC) 10 MG tablet Take 1 tablet (10 mg total) by mouth daily.  Water engineer Bandages & Supports (LUMBAR BACK BRACE/SUPPORT PAD) MISC 1 Units by Other route daily as needed. Use back brace with activity to reduce pain  . hydrochlorothiazide (HYDRODIURIL) 25 MG tablet Take 1 tablet (25 mg total) by mouth daily.  . methylphenidate (RITALIN) 20 MG tablet Take 20 mg by mouth daily.  Marland Kitchen omega-3 acid ethyl esters (LOVAZA) 1 g capsule Take 2 g by mouth 2 (two) times daily.  Marland Kitchen omeprazole (PRILOSEC) 40 MG capsule Take 40 mg by mouth daily.  Marland Kitchen tiZANidine (ZANAFLEX) 4 MG capsule Take 4 mg by mouth 2 (two) times daily.  . traMADol (ULTRAM) 50 MG tablet Take 1 tablet (50 mg total) by mouth every 6 (six) hours as needed. Pain   No facility-administered  encounter medications on file as of 11/11/2016.     No Known Allergies  Review of Systems  Constitutional: Negative for activity change and appetite change.  HENT: Negative for congestion, sinus pain and sinus pressure.   Eyes: Negative for photophobia and visual disturbance.  Respiratory: Negative for cough and shortness of breath.   Cardiovascular: Negative for chest pain, palpitations and leg swelling.  Gastrointestinal: Positive for abdominal distention. Negative for constipation and diarrhea.       Overweight  Genitourinary:       Incontinence  Musculoskeletal: Positive for arthralgias and back pain.  Neurological: Negative for dizziness and light-headedness.  Psychiatric/Behavioral: Negative for dysphoric mood and sleep disturbance.       Says sleep Is good     BP 136/78 (BP Location: Right Arm, Patient Position: Sitting, Cuff Size: Large)   Pulse 68   Temp 98.4 F (36.9 C) (Oral)   Resp 20   Ht 5\' 9"  (1.753 m)   Wt 275 lb 1.3 oz (124.8 kg)   SpO2 99%   BMI 40.62 kg/m   Physical Exam  Constitutional: He is oriented to person, place, and time. He appears well-developed and well-nourished. He appears distressed.  HENT:  Head: Normocephalic and atraumatic.  Mouth/Throat: Oropharynx is clear and moist.  Eyes: Conjunctivae are normal. Pupils are equal, round, and reactive to light.  Neck: Normal range of motion. No thyromegaly present.  Cardiovascular: Normal rate, regular rhythm and normal heart sounds.   Pulmonary/Chest: Effort normal and  breath sounds normal. No respiratory distress.  Lymphadenopathy:    He has no cervical adenopathy.  Neurological: He is alert and oriented to person, place, and time.  Psychiatric: He has a normal mood and affect. His behavior is normal. Thought content normal.    ASSESSMENT/PLAN:  1. Essential hypertension  2. Gastroesophageal reflux disease, esophagitis presence not specified  3. Hyperlipidemia, unspecified hyperlipidemia  type - Lipid panel  4. Morbid obesity ([place]) - Hemoglobin A1c   Patient Instructions  Try to reduce the portions of food and stay away from fried foods Try to get more exercise You need to lose a little weight  See me in 3 months  Call sooner for problems  DASH Eating Plan DASH stands for "Dietary Approaches to Stop Hypertension." The DASH eating plan is a healthy eating plan that has been shown to reduce high blood pressure (hypertension). Additional health benefits may include reducing the risk of type 2 diabetes mellitus, heart disease, and stroke. The DASH eating plan may also help with weight loss. What do I need to know about the DASH eating plan? For the DASH eating plan, you will follow these general guidelines:  Choose foods with less than 150 milligrams of sodium per serving (as listed on the food label).  Use salt-free seasonings or herbs instead of table salt or sea salt.  Check with your health care provider or pharmacist before using salt substitutes.  Eat lower-sodium products. These are often labeled as "low-sodium" or "no salt added."  Eat fresh foods. Avoid eating a lot of canned foods.  Eat more vegetables, fruits, and low-fat dairy products.  Choose whole grains. Look for the word "whole" as the first word in the ingredient list.  Choose fish and skinless chicken or [place] more often than red meat. Limit fish, poultry, and meat to 6 oz (170 g) each day.  Limit sweets, desserts, sugars, and sugary drinks.  Choose heart-healthy fats.  Eat more home-cooked food and less restaurant, buffet, and fast food.  Limit fried foods.  Do not fry foods. Cook foods using methods such as baking, boiling, grilling, and broiling instead.  When eating at a restaurant, ask that your food be prepared with less salt, or no salt if possible. What foods can I eat? Seek help from a dietitian for individual calorie needs. Grains  Whole grain or whole wheat bread. Brown  rice. Whole grain or whole wheat pasta. Quinoa, bulgur, and whole grain cereals. Low-sodium cereals. Corn or whole wheat flour tortillas. Whole grain cornbread. Whole grain crackers. Low-sodium crackers. Vegetables  Fresh or frozen vegetables (raw, steamed, roasted, or grilled). Low-sodium or reduced-sodium tomato and vegetable juices. Low-sodium or reduced-sodium tomato sauce and paste. Low-sodium or reduced-sodium canned vegetables. Fruits  All fresh, canned (in natural juice), or frozen fruits. Meat and Other Protein Products  Ground beef (85% or leaner), grass-fed beef, or beef trimmed of fat. Skinless chicken or [place]. Ground chicken or [place]. Pork trimmed of fat. All fish and seafood. Eggs. Dried beans, peas, or lentils. Unsalted nuts and seeds. Unsalted canned beans. Dairy  Low-fat dairy products, such as skim or 1% milk, 2% or reduced-fat cheeses, low-fat ricotta or cottage cheese, or plain low-fat yogurt. Low-sodium or reduced-sodium cheeses. Fats and Oils  Tub margarines without trans fats. Light or reduced-fat mayonnaise and salad dressings (reduced sodium). Avocado. Safflower, olive, or canola oils. Natural peanut or almond butter. Other  Unsalted popcorn and pretzels. The items listed above may not be a complete list of  recommended foods or beverages. Contact your dietitian for more options.  What foods are not recommended? Grains  White bread. White pasta. White rice. Refined cornbread. Bagels and croissants. Crackers that contain trans fat. Vegetables  Creamed or fried vegetables. Vegetables in a cheese sauce. Regular canned vegetables. Regular canned tomato sauce and paste. Regular tomato and vegetable juices. Fruits  Canned fruit in light or heavy syrup. Fruit juice. Meat and Other Protein Products  Fatty cuts of meat. Ribs, chicken wings, bacon, sausage, bologna, salami, chitterlings, fatback, hot dogs, bratwurst, and packaged luncheon meats. Salted nuts and seeds. Canned  beans with salt. Dairy  Whole or 2% milk, cream, half-and-half, and cream cheese. Whole-fat or sweetened yogurt. Full-fat cheeses or blue cheese. Nondairy creamers and whipped toppings. Processed cheese, cheese spreads, or cheese curds. Condiments  Onion and garlic salt, seasoned salt, table salt, and sea salt. Canned and packaged gravies. Worcestershire sauce. Tartar sauce. Barbecue sauce. Teriyaki sauce. Soy sauce, including reduced sodium. Steak sauce. Fish sauce. Oyster sauce. Cocktail sauce. Horseradish. Ketchup and mustard. Meat flavorings and tenderizers. Bouillon cubes. Hot sauce. Tabasco sauce. Marinades. Taco seasonings. Relishes. Fats and Oils  Butter, stick margarine, lard, shortening, ghee, and bacon fat. Coconut, palm kernel, or palm oils. Regular salad dressings. Other  Pickles and olives. Salted popcorn and pretzels. The items listed above may not be a complete list of foods and beverages to avoid. Contact your dietitian for more information.  Where can I find more information? National Heart, Lung, and Blood Institute: travelstabloid.com This information is not intended to replace advice given to you by your health care provider. Make sure you discuss any questions you have with your health care provider. Document Released: 10/02/2011 Document Revised: 03/20/2016 Document Reviewed: 08/17/2013 Elsevier Interactive Patient Education  2017 Elsevier Inc.    [name], MD

## 2016-11-11 NOTE — Patient Instructions (Addendum)
Try to reduce the portions of food and stay away from fried foods Try to get more exercise You need to lose a little weight  See me in 3 months  Call sooner for problems  DASH Eating Plan DASH stands for "Dietary Approaches to Stop Hypertension." The DASH eating plan is a healthy eating plan that has been shown to reduce high blood pressure (hypertension). Additional health benefits may include reducing the risk of type 2 diabetes mellitus, heart disease, and stroke. The DASH eating plan may also help with weight loss. What do I need to know about the DASH eating plan? For the DASH eating plan, you will follow these general guidelines:  Choose foods with less than 150 milligrams of sodium per serving (as listed on the food label).  Use salt-free seasonings or herbs instead of table salt or sea salt.  Check with your health care provider or pharmacist before using salt substitutes.  Eat lower-sodium products. These are often labeled as "low-sodium" or "no salt added."  Eat fresh foods. Avoid eating a lot of canned foods.  Eat more vegetables, fruits, and low-fat dairy products.  Choose whole grains. Look for the word "whole" as the first word in the ingredient list.  Choose fish and skinless chicken or [place] more often than red meat. Limit fish, poultry, and meat to 6 oz (170 g) each day.  Limit sweets, desserts, sugars, and sugary drinks.  Choose heart-healthy fats.  Eat more home-cooked food and less restaurant, buffet, and fast food.  Limit fried foods.  Do not fry foods. Cook foods using methods such as baking, boiling, grilling, and broiling instead.  When eating at a restaurant, ask that your food be prepared with less salt, or no salt if possible. What foods can I eat? Seek help from a dietitian for individual calorie needs. Grains  Whole grain or whole wheat bread. Brown rice. Whole grain or whole wheat pasta. Quinoa, bulgur, and whole grain cereals. Low-sodium  cereals. Corn or whole wheat flour tortillas. Whole grain cornbread. Whole grain crackers. Low-sodium crackers. Vegetables  Fresh or frozen vegetables (raw, steamed, roasted, or grilled). Low-sodium or reduced-sodium tomato and vegetable juices. Low-sodium or reduced-sodium tomato sauce and paste. Low-sodium or reduced-sodium canned vegetables. Fruits  All fresh, canned (in natural juice), or frozen fruits. Meat and Other Protein Products  Ground beef (85% or leaner), grass-fed beef, or beef trimmed of fat. Skinless chicken or [place]. Ground chicken or [place]. Pork trimmed of fat. All fish and seafood. Eggs. Dried beans, peas, or lentils. Unsalted nuts and seeds. Unsalted canned beans. Dairy  Low-fat dairy products, such as skim or 1% milk, 2% or reduced-fat cheeses, low-fat ricotta or cottage cheese, or plain low-fat yogurt. Low-sodium or reduced-sodium cheeses. Fats and Oils  Tub margarines without trans fats. Light or reduced-fat mayonnaise and salad dressings (reduced sodium). Avocado. Safflower, olive, or canola oils. Natural peanut or almond butter. Other  Unsalted popcorn and pretzels. The items listed above may not be a complete list of recommended foods or beverages. Contact your dietitian for more options.  What foods are not recommended? Grains  White bread. White pasta. White rice. Refined cornbread. Bagels and croissants. Crackers that contain trans fat. Vegetables  Creamed or fried vegetables. Vegetables in a cheese sauce. Regular canned vegetables. Regular canned tomato sauce and paste. Regular tomato and vegetable juices. Fruits  Canned fruit in light or heavy syrup. Fruit juice. Meat and Other Protein Products  Fatty cuts of meat. Ribs, chicken wings, bacon,  sausage, bologna, salami, chitterlings, fatback, hot dogs, bratwurst, and packaged luncheon meats. Salted nuts and seeds. Canned beans with salt. Dairy  Whole or 2% milk, cream, half-and-half, and cream cheese. Whole-fat  or sweetened yogurt. Full-fat cheeses or blue cheese. Nondairy creamers and whipped toppings. Processed cheese, cheese spreads, or cheese curds. Condiments  Onion and garlic salt, seasoned salt, table salt, and sea salt. Canned and packaged gravies. Worcestershire sauce. Tartar sauce. Barbecue sauce. Teriyaki sauce. Soy sauce, including reduced sodium. Steak sauce. Fish sauce. Oyster sauce. Cocktail sauce. Horseradish. Ketchup and mustard. Meat flavorings and tenderizers. Bouillon cubes. Hot sauce. Tabasco sauce. Marinades. Taco seasonings. Relishes. Fats and Oils  Butter, stick margarine, lard, shortening, ghee, and bacon fat. Coconut, palm kernel, or palm oils. Regular salad dressings. Other  Pickles and olives. Salted popcorn and pretzels. The items listed above may not be a complete list of foods and beverages to avoid. Contact your dietitian for more information.  Where can I find more information? National Heart, Lung, and Blood Institute: travelstabloid.com This information is not intended to replace advice given to you by your health care provider. Make sure you discuss any questions you have with your health care provider. Document Released: 10/02/2011 Document Revised: 03/20/2016 Document Reviewed: 08/17/2013 Elsevier Interactive Patient Education  2017 [employer].

## 2016-11-11 NOTE — Progress Notes (Signed)
Patient unable to receive Delton See today due to labs. Per Milbert Coulter, RPh, corrected calcium is also too low. Patient already has scheduled appt for Xgeva in one month. Instructed patient to take tums 1 in am and 1 in pm per PA-C.

## 2016-11-17 DIAGNOSIS — C61 Malignant neoplasm of prostate: Secondary | ICD-10-CM | POA: Diagnosis not present

## 2016-11-17 DIAGNOSIS — C7951 Secondary malignant neoplasm of bone: Secondary | ICD-10-CM | POA: Diagnosis not present

## 2016-11-26 ENCOUNTER — Ambulatory Visit (HOSPITAL_COMMUNITY): Payer: Medicare Other | Admitting: Oncology

## 2016-11-26 ENCOUNTER — Other Ambulatory Visit (HOSPITAL_COMMUNITY): Payer: Medicare Other

## 2016-11-26 ENCOUNTER — Ambulatory Visit (HOSPITAL_COMMUNITY): Payer: Medicare Other

## 2016-12-01 ENCOUNTER — Other Ambulatory Visit: Payer: Self-pay | Admitting: Family Medicine

## 2016-12-01 MED ORDER — TRAMADOL HCL 50 MG PO TABS: 50.0000 mg | ORAL_TABLET | Freq: Four times a day (QID) | ORAL | 0 refills | Status: DC | PRN

## 2016-12-01 NOTE — Telephone Encounter (Signed)
Seen 11 6 18 

## 2016-12-02 ENCOUNTER — Telehealth: Payer: Self-pay | Admitting: Family Medicine

## 2016-12-02 NOTE — Telephone Encounter (Signed)
Left message for pt to call office

## 2016-12-02 NOTE — Telephone Encounter (Signed)
[name] is asking about a Rx for a back brace, please advise?

## 2016-12-02 NOTE — Telephone Encounter (Signed)
Not helpful for his condition

## 2016-12-03 ENCOUNTER — Encounter: Payer: Self-pay | Admitting: Family Medicine

## 2016-12-03 DIAGNOSIS — D126 Benign neoplasm of colon, unspecified: Secondary | ICD-10-CM | POA: Insufficient documentation

## 2016-12-09 ENCOUNTER — Ambulatory Visit (HOSPITAL_COMMUNITY): Payer: Medicare Other

## 2016-12-09 ENCOUNTER — Ambulatory Visit (HOSPITAL_COMMUNITY): Payer: Medicare Other | Admitting: Oncology

## 2016-12-09 ENCOUNTER — Other Ambulatory Visit (HOSPITAL_COMMUNITY): Payer: Medicare Other

## 2016-12-10 ENCOUNTER — Encounter (HOSPITAL_COMMUNITY): Payer: Medicare Other | Attending: Oncology

## 2016-12-10 ENCOUNTER — Other Ambulatory Visit (HOSPITAL_COMMUNITY): Payer: Self-pay | Admitting: Oncology

## 2016-12-10 ENCOUNTER — Encounter (HOSPITAL_COMMUNITY): Payer: Self-pay

## 2016-12-10 ENCOUNTER — Encounter (HOSPITAL_BASED_OUTPATIENT_CLINIC_OR_DEPARTMENT_OTHER): Payer: Medicare Other

## 2016-12-10 VITALS — BP 148/73 | HR 76 | Resp 16

## 2016-12-10 DIAGNOSIS — C7951 Secondary malignant neoplasm of bone: Secondary | ICD-10-CM

## 2016-12-10 DIAGNOSIS — C61 Malignant neoplasm of prostate: Secondary | ICD-10-CM

## 2016-12-10 LAB — CBC WITH DIFFERENTIAL/PLATELET
BASOS PCT: 0 %
Basophils Absolute: 0 10*3/uL (ref 0.0–0.1)
Eosinophils Absolute: 0.2 10*3/uL (ref 0.0–0.7)
Eosinophils Relative: 3 %
HCT: 37.9 % — ABNORMAL LOW (ref 39.0–52.0)
HEMOGLOBIN: 12.9 g/dL — AB (ref 13.0–17.0)
LYMPHS ABS: 2.5 10*3/uL (ref 0.7–4.0)
Lymphocytes Relative: 39 %
MCH: 28.9 pg (ref 26.0–34.0)
MCHC: 34 g/dL (ref 30.0–36.0)
MCV: 85 fL (ref 78.0–100.0)
Monocytes Absolute: 0.3 10*3/uL (ref 0.1–1.0)
Monocytes Relative: 5 %
NEUTROS PCT: 53 %
Neutro Abs: 3.3 10*3/uL (ref 1.7–7.7)
Platelets: 162 10*3/uL (ref 150–400)
RBC: 4.46 MIL/uL (ref 4.22–5.81)
RDW: 14.1 % (ref 11.5–15.5)
WBC: 6.3 10*3/uL (ref 4.0–10.5)

## 2016-12-10 LAB — COMPREHENSIVE METABOLIC PANEL
ALBUMIN: 4.4 g/dL (ref 3.5–5.0)
ALK PHOS: 49 U/L (ref 38–126)
ALT: 25 U/L (ref 17–63)
AST: 33 U/L (ref 15–41)
Anion gap: 10 (ref 5–15)
BUN: 14 mg/dL (ref 6–20)
CALCIUM: 9.9 mg/dL (ref 8.9–10.3)
CO2: 30 mmol/L (ref 22–32)
CREATININE: 1.14 mg/dL (ref 0.61–1.24)
Chloride: 96 mmol/L — ABNORMAL LOW (ref 101–111)
GFR calc Af Amer: >60 mL/min (ref >60)
GFR calc non Af Amer: >60 mL/min (ref >60)
GLUCOSE: 104 mg/dL — AB (ref 65–99)
Potassium: 3.8 mmol/L (ref 3.5–5.1)
SODIUM: 136 mmol/L (ref 135–145)
Total Bilirubin: 0.8 mg/dL (ref 0.3–1.2)
Total Protein: 7.3 g/dL (ref 6.5–8.1)

## 2016-12-10 LAB — PSA: PSA: 0.13 ng/mL (ref 0.00–4.00)

## 2016-12-10 MED ORDER — DENOSUMAB 120 MG/1.7ML ~~LOC~~ SOLN
120.0000 mg | Freq: Once | SUBCUTANEOUS | Status: AC
  Administered 2016-12-10: 120 mg via SUBCUTANEOUS
  Filled 2016-12-10: qty 1.7

## 2016-12-10 NOTE — Patient Instructions (Signed)
[name] at [facility] Discharge Instructions  RECOMMENDATIONS MADE BY THE CONSULTANT AND ANY TEST RESULTS WILL BE SENT TO YOUR REFERRING PHYSICIAN.  xgeva given  Follow up as ordered  Thank you for choosing [place] at [facility] to provide your oncology and hematology care.  To afford each patient quality time with our provider, please arrive at least 15 minutes before your scheduled appointment time.    If you have a lab appointment with the [facility] please come in thru the  Main Entrance and check in at the main information desk  You need to re-schedule your appointment should you arrive 10 or more minutes late.  We strive to give you quality time with our providers, and arriving late affects you and other patients whose appointments are after yours.  Also, if you no show three or more times for appointments you may be dismissed from the clinic at the providers discretion.     Again, thank you for choosing [facility].  Our hope is that these requests will decrease the amount of time that you wait before being seen by our physicians.       _____________________________________________________________  Should you have questions after your visit to [facility], please contact our office at [phone] between the hours of 8:30 a.m. and 4:30 p.m.  Voicemails left after 4:30 p.m. will not be returned until the following business day.  For prescription refill requests, have your pharmacy contact our office.       Resources For Cancer Patients and their Caregivers ? American Cancer Society: Can assist with transportation, wigs, general needs, runs Look Good Feel Better.        312-387-3879 ? Cancer Care: Provides financial assistance, online support groups, medication/co-pay assistance.  1-800-813-HOPE (775)690-2115) ? [place] Assists [place] cancer patients and their families  through emotional , educational and financial support.  364-063-1535 ? [place] DSS Where to apply for food stamps, Medicaid and utility assistance. 901-167-1379 ? [facility]: Transportation to medical appointments. 7031189745 ? Social Security Administration: May apply for disability if have a Stage IV cancer. 603-624-2716 223-640-7172 ? [employer], Disability and Transit Services: Assists with nutrition, care and transit needs. [place] Support Programs: @10RELATIVEDAYS @ > Cancer Support Group  2nd Tuesday of the month 1pm-2pm, Journey Room  > Creative Journey  3rd Tuesday of the month 1130am-1pm, Journey Room  > Look Good Feel Better  1st Wednesday of the month 10am-12 noon, Journey Room (Call [place] to register [phone])

## 2016-12-10 NOTE — Progress Notes (Signed)
xgeva 120mg  given today per orders. Patient tolerated it well. Vitals stable and discharged home from clinic ambulatory.follow up as scheduled.

## 2016-12-11 LAB — VITAMIN D 25 HYDROXY (VIT D DEFICIENCY, FRACTURES): VIT D 25 HYDROXY: 48.6 ng/mL (ref 30.0–100.0)

## 2016-12-12 ENCOUNTER — Ambulatory Visit (INDEPENDENT_AMBULATORY_CARE_PROVIDER_SITE_OTHER): Payer: Medicare Other | Admitting: Family Medicine

## 2016-12-12 ENCOUNTER — Encounter: Payer: Self-pay | Admitting: Family Medicine

## 2016-12-12 VITALS — BP 122/82 | HR 80 | Temp 98.4°F | Resp 18 | Ht 69.0 in | Wt 276.1 lb

## 2016-12-12 DIAGNOSIS — M545 Low back pain, unspecified: Secondary | ICD-10-CM

## 2016-12-12 DIAGNOSIS — R7301 Impaired fasting glucose: Secondary | ICD-10-CM

## 2016-12-12 DIAGNOSIS — I10 Essential (primary) hypertension: Secondary | ICD-10-CM | POA: Diagnosis not present

## 2016-12-12 DIAGNOSIS — G8929 Other chronic pain: Secondary | ICD-10-CM

## 2016-12-12 DIAGNOSIS — M5432 Sciatica, left side: Secondary | ICD-10-CM | POA: Diagnosis not present

## 2016-12-12 MED ORDER — TIZANIDINE HCL 4 MG PO CAPS: 4.0000 mg | ORAL_CAPSULE | Freq: Two times a day (BID) | ORAL | 0 refills | Status: DC

## 2016-12-12 MED ORDER — LUMBAR BACK BRACE/SUPPORT PAD MISC: 1.0000 [IU] | Freq: Every day | 0 refills | Status: DC | PRN

## 2016-12-12 MED ORDER — METHYLPREDNISOLONE 4 MG PO TBPK: ORAL_TABLET | ORAL | 0 refills | Status: DC

## 2016-12-12 MED ORDER — METFORMIN HCL 500 MG PO TABS: 500.0000 mg | ORAL_TABLET | Freq: Every day | ORAL | 0 refills | Status: DC

## 2016-12-12 NOTE — Patient Instructions (Signed)
Take the medrol pak as instructed This is a prednisone for the sciatica  Take the tizanidine as needed, this is a muscle relaxer  Take the metformin once a day in the morning This will help reduce blood sugar and weight  Need a follow up in three months Get lab tests prior to appointment

## 2016-12-12 NOTE — Progress Notes (Signed)
Chief Complaint  Patient presents with  . Leg Pain    left x 2 weeks   Here for an acute problem He has chronic low back pain.  Current flare of pain with pain down the left leg to the foot.  No numbness or  Weakness.  No bowel or bladder changes. No trauma or fall.  No recent illness or fever. He knows his weight is contributing to his back pain.  He insists he cannot lose weight without help.  As I look at his old labs he has had an increased fasting blood sugar on a couple of occasions.  Will try a 3 month supply of metformin daily.    In addition he agrees to try to reduce portions. And to walk every day that the weather allows. BP control is good.  Compliant with medictions  Patient Active Problem List   Diagnosis Date Noted  . Tubular adenoma of colon 12/03/2016  . Morbid obesity ([place]) 10/24/2016  . Essential hypertension 10/24/2016  . HLD (hyperlipidemia) 10/24/2016  . 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 Chronic back pain 10/24/2016  . PUD (peptic ulcer disease) 10/24/2016  . GERD (gastroesophageal reflux disease) 10/24/2016  . Hemorrhoids 10/24/2016  . Sleep apnea 10/24/2016  . Uncontrolled daytime somnolence 10/24/2016  . HOH (hard of hearing) 10/24/2016  . Testicular discomfort 05/07/2016  . 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 benazepril (LOTENSIN) 20 MG tablet Take 1 tablet (20 mg total) by mouth daily.  . Calcium Carb-Cholecalciferol (CALCIUM-VITAMIN D) 500-200 MG-UNIT tablet Take 1 tablet by mouth daily.  . cetirizine (ZYRTEC) 10 MG tablet Take 1 tablet (10 mg total) by mouth daily.  Water engineer Bandages & Supports (LUMBAR BACK BRACE/SUPPORT PAD) MISC 1 Units by Other route daily as needed. Use back brace with activity to reduce pain  . hydrochlorothiazide (HYDRODIURIL) 25 MG tablet Take 1 tablet (25 mg total) by mouth daily.  . methylphenidate  (RITALIN) 20 MG tablet Take 20 mg by mouth daily.  Marland Kitchen omega-3 acid ethyl esters (LOVAZA) 1 g capsule Take 2 g by mouth 2 (two) times daily.  Marland Kitchen omeprazole (PRILOSEC) 40 MG capsule Take 40 mg by mouth daily.  Marland Kitchen tiZANidine (ZANAFLEX) 4 MG capsule Take 1 capsule (4 mg total) by mouth 2 (two) times daily.  . traMADol (ULTRAM) 50 MG tablet Take 1 tablet (50 mg total) by mouth every 6 (six) hours as needed. Pain  . metFORMIN (GLUCOPHAGE) 500 MG tablet Take 1 tablet (500 mg total) by mouth daily with breakfast. For weight and sugar  . methylPREDNISolone (MEDROL DOSEPAK) 4 MG TBPK tablet Tad   No facility-administered encounter medications on file as of 12/12/2016.     No Known Allergies  Review of Systems  Constitutional: Negative for activity change and appetite change.  HENT: Negative for congestion, sinus pain and sinus pressure.   Eyes: Negative for photophobia and visual disturbance.  Respiratory: Negative for cough and shortness of breath.   Cardiovascular: Negative for chest pain, palpitations and leg swelling.  Gastrointestinal: Positive for abdominal distention. Negative for constipation and diarrhea.       Overweight  Genitourinary:       Incontinence  Musculoskeletal: Positive for arthralgias and back pain.       Current left leg pain  Neurological: Negative for dizziness and light-headedness.  Psychiatric/Behavioral: Negative for dysphoric mood and sleep disturbance.  Says mood Is good    BP 122/82 (BP Location: Right Arm, Patient Position: Sitting, Cuff Size: Large)   Pulse 80   Temp 98.4 F (36.9 C) (Temporal)   Resp 18   Ht 5\' 9"  (1.753 m)   Wt 276 lb 1.9 oz (125.2 kg)   SpO2 97%   BMI 40.78 kg/m   Physical Exam  ASSESSMENT/PLAN:  1. Essential hypertension  - CBC - COMPLETE METABOLIC PANEL WITH GFR - Hemoglobin A1c - Lipid panel - Urinalysis, Routine w reflex microscopic - VITAMIN D 25 Hydroxy (Vit-D Deficiency, Fractures)  2. Morbid obesity  ([place])   3. Chronic midline low back pain without sciatica  4. Sciatica, left side   5. Elevated fasting glucose    Patient Instructions  Take the medrol pak as instructed This is a prednisone for the sciatica  Take the tizanidine as needed, this is a muscle relaxer  Take the metformin once a day in the morning This will help reduce blood sugar and weight  Need a follow up in three months Get lab tests prior to appointment   [name], MD

## 2016-12-29 ENCOUNTER — Other Ambulatory Visit: Payer: Self-pay

## 2016-12-30 ENCOUNTER — Ambulatory Visit (INDEPENDENT_AMBULATORY_CARE_PROVIDER_SITE_OTHER): Payer: Medicare Other | Admitting: Family Medicine

## 2016-12-30 ENCOUNTER — Other Ambulatory Visit (HOSPITAL_COMMUNITY)
Admission: AD | Admit: 2016-12-30 | Discharge: 2016-12-30 | Disposition: A | Discharge status: Home / Self Care | Payer: Medicare Other | Source: Skilled Nursing Facility | Attending: Family Medicine | Admitting: Family Medicine

## 2016-12-30 ENCOUNTER — Encounter: Payer: Self-pay | Admitting: Family Medicine

## 2016-12-30 VITALS — BP 132/76 | HR 72 | Temp 98.4°F | Resp 18 | Ht 69.0 in | Wt 279.1 lb

## 2016-12-30 DIAGNOSIS — N3091 Cystitis, unspecified with hematuria: Secondary | ICD-10-CM | POA: Diagnosis not present

## 2016-12-30 DIAGNOSIS — R350 Frequency of micturition: Secondary | ICD-10-CM

## 2016-12-30 LAB — POCT URINALYSIS DIPSTICK
BILIRUBIN UA: NEGATIVE
GLUCOSE UA: NEGATIVE
KETONES UA: NEGATIVE
NITRITE UA: NEGATIVE
Protein, UA: 30
Spec Grav, UA: 1.025
Urobilinogen, UA: 0.2
pH, UA: 5.5

## 2016-12-30 MED ORDER — METHYLPHENIDATE HCL 20 MG PO TABS: 20.0000 mg | ORAL_TABLET | Freq: Every day | ORAL | 0 refills | Status: DC

## 2016-12-30 MED ORDER — CIPROFLOXACIN HCL 500 MG PO TABS: 500.0000 mg | ORAL_TABLET | Freq: Two times a day (BID) | ORAL | 0 refills | Status: AC

## 2016-12-30 MED ORDER — TRAMADOL HCL 50 MG PO TABS: 50.0000 mg | ORAL_TABLET | Freq: Four times a day (QID) | ORAL | 0 refills | Status: DC | PRN

## 2016-12-30 NOTE — Progress Notes (Signed)
Chief Complaint  Patient presents with  . Urinary Frequency   Patient is here for urinary frequency. Some discomfort with urination. He has had recurring urinary infections. He feels like he has a bladder infection. He has had these frequently since his prostate cancer surgery. In addition he requests refill of his tramadol. he takes this for pain. No side effects or problems from the tramadol. He takes Ritalin. He's been on this for 20 years. He states his for daytime sleepiness. He states it's because he has a "sleep disorder". I discussed with him that he had had one prescription filled in the last 2 months. If he is only taking 30 pills every 60 days, perhaps he could do without this medication. He is adamant that he does not want to stop his Ritalin. He feels he needs this medicine in order not to "sleep all day"  Patient Active Problem List   Diagnosis Date Noted  . Tubular adenoma of colon 12/03/2016  . Morbid obesity ([place]) 10/24/2016  . Essential hypertension 10/24/2016  . HLD (hyperlipidemia) 10/24/2016  . 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 Chronic back pain 10/24/2016  . PUD (peptic ulcer disease) 10/24/2016  . GERD (gastroesophageal reflux disease) 10/24/2016  . Hemorrhoids 10/24/2016  . Sleep apnea 10/24/2016  . Uncontrolled daytime somnolence 10/24/2016  . HOH (hard of hearing) 10/24/2016  . Testicular discomfort 05/07/2016  . 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 benazepril (LOTENSIN) 20 MG tablet Take 1 tablet (20 mg total) by mouth daily.  . Calcium Carb-Cholecalciferol (CALCIUM-VITAMIN D) 500-200 MG-UNIT tablet Take 1 tablet by mouth daily.  . cetirizine (ZYRTEC) 10 MG tablet Take 1 tablet (10 mg total) by mouth daily.  Water engineer Bandages & Supports (LUMBAR BACK BRACE/SUPPORT PAD) MISC 1 Units by Other route daily as needed. Use back brace with  activity to reduce pain  . hydrochlorothiazide (HYDRODIURIL) 25 MG tablet Take 1 tablet (25 mg total) by mouth daily.  . metFORMIN (GLUCOPHAGE) 500 MG tablet Take 1 tablet (500 mg total) by mouth daily with breakfast. For weight and sugar  . methylphenidate (RITALIN) 20 MG tablet Take 1 tablet (20 mg total) by mouth daily.  . methylPREDNISolone (MEDROL DOSEPAK) 4 MG TBPK tablet Tad  . omega-3 acid ethyl esters (LOVAZA) 1 g capsule Take 2 g by mouth 2 (two) times daily.  Marland Kitchen omeprazole (PRILOSEC) 40 MG capsule Take 40 mg by mouth daily.  Marland Kitchen tiZANidine (ZANAFLEX) 4 MG capsule Take 1 capsule (4 mg total) by mouth 2 (two) times daily.  . traMADol (ULTRAM) 50 MG tablet Take 1 tablet (50 mg total) by mouth every 6 (six) hours as needed. Pain  . ciprofloxacin (CIPRO) 500 MG tablet Take 1 tablet (500 mg total) by mouth 2 (two) times daily.   No facility-administered encounter medications on file as of 12/30/2016.     No Known Allergies  Review of Systems  Constitutional: Negative for chills and fever.  Gastrointestinal: Negative for abdominal pain, constipation and diarrhea.  Genitourinary: Positive for dysuria and frequency. Negative for flank pain and hematuria.  Musculoskeletal: Negative for arthralgias and back pain.  Otherwise patient is feeling well  BP 132/76 (BP Location: Left Arm, Patient Position: Sitting, Cuff Size: Large)   Pulse 72   Temp 98.4 F (36.9 C) (Temporal)   Resp 18   Ht 5\' 9"  (1.753 m)   Wt 279 lb  1.9 oz (126.6 kg)   SpO2 98%   BMI 41.22 kg/m   Physical Exam  Constitutional: He is oriented to person, place, and time. He appears well-developed and well-nourished. No distress.  Cardiovascular: Normal rate, regular rhythm and normal heart sounds.   Pulmonary/Chest: Effort normal and breath sounds normal. No respiratory distress.  No flank pain  Abdominal: Soft. Bowel sounds are normal. There is no tenderness.  Neurological: He is alert and oriented to person, place,  and time.  Psychiatric: He has a normal mood and affect. His behavior is normal.   Results for orders placed or performed in visit on 12/30/16  POCT urinalysis dipstick  Result Value Ref Range   Color, UA yellow    Clarity, UA clear    Glucose, UA neg    Bilirubin, UA neg    Ketones, UA neg    Spec Grav, UA 1.025    Blood, UA mod    pH, UA 5.5    Protein, UA 30    Urobilinogen, UA 0.2    Nitrite, UA neg    Leukocytes, UA small (1+) (A) Negative    ASSESSMENT/PLAN:  1. Frequency of micturition  - POCT urinalysis dipstick - Urine culture  2. Cystitis with hematuria    Patient Instructions  Take the antibiotic as prescribed Take 2 a day Drink lots of water Call if not better in a few days   [name], MD

## 2016-12-30 NOTE — Patient Instructions (Signed)
Take the antibiotic as prescribed Take 2 a day Drink lots of water Call if not better in a few days

## 2017-01-02 LAB — URINE CULTURE

## 2017-01-06 ENCOUNTER — Other Ambulatory Visit (HOSPITAL_COMMUNITY): Payer: Self-pay | Admitting: *Deleted

## 2017-01-06 DIAGNOSIS — C7951 Secondary malignant neoplasm of bone: Secondary | ICD-10-CM

## 2017-01-06 DIAGNOSIS — C61 Malignant neoplasm of prostate: Secondary | ICD-10-CM

## 2017-01-06 NOTE — Progress Notes (Signed)
[place], [place] [address]   CLINIC:  Medical Oncology/Hematology  PCP:  [name], MD [phone] 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   CURRENT THERAPY: Depo-Lupron (with Dr. Courtney Heys) AND Delton See monthly     BRIEF ONCOLOGIC HISTORY:  (From Dr. Donald Pore last note on 09/03/16)     INTERVAL HISTORY:  [name] 76 y.o. male who presents for continued follow-up for metastatic prostate cancer with bone mets.    Dr. Exie Parody, the patient's urologist, is no longer practicing in [place]; [name] has reportedly not been seen by a new urologist yet; he tells me he has an appointment scheduled for mid-April with a new doctor, but he cannot recall the physician's name.  He also cannot recall when he had his last Lupron injection, "but it's been quite a few months. I think I missed 2 shots."  Based on previous records, it appears that he was receiving Lupron injections every 3 months with Dr. Exie Parody (indicating that he may have not had any androgen-deprivation therapy for at least 6 months).  He takes calcium/vitamin D supplementation daily. He is not exercising regularly, but has plans to start walking and going to the [facility] soon to start being more physically active.   He is here today unaccompanied for follow-up and for his next Xgeva injection.  Denies any new bone pain. Denies any new pain at all.  His biggest complaint today is urinary incontinence; he was recently diagnosed and treated for a UTI on 12/30/16 by his PCP.  [name] tells me he completed antibiotics.  He states that "urine just comes out when I bend over. I've had to start wearing some pads."  He has questions about erectile dysfunction and what can be done. States he is not sure if he has ever taken Viagra or Cialis in the past.    Last Xgeva dose given on 12/10/16.     REVIEW OF SYSTEMS:  Review of Systems  Constitutional: Negative.  Negative for chills, fatigue and fever.  HENT:  Negative.   Eyes: Negative.   Respiratory: Negative.  Negative for cough and shortness of breath.   Cardiovascular: Negative.  Negative for chest pain and leg swelling.  Gastrointestinal: Negative.  Negative for abdominal pain, blood in stool, constipation, diarrhea, nausea and vomiting.  Genitourinary: Positive for bladder incontinence. Negative for dysuria and hematuria.   Musculoskeletal: Negative.  Negative for arthralgias.  Skin: Negative.  Negative for rash.  Neurological: Negative.  Negative for dizziness and headaches.  Hematological: Negative.  Negative for adenopathy. Does not bruise/bleed easily.  Psychiatric/Behavioral: Negative.  Negative for depression and sleep disturbance. The patient is not nervous/anxious.      PAST MEDICAL/SURGICAL HISTORY:  Past Medical History:  Diagnosis Date  . Cancer [facility])    prostate  . GERD (gastroesophageal reflux disease)   . Hyperlipidemia   . Hypertension   . 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 Sleep apnea 10/24/2016   Past Surgical History:  Procedure Laterality Date  . CHOLECYSTECTOMY    . HAND SURGERY    . INGUINAL HERNIA REPAIR Left   . PROSTATECTOMY    . PROSTATECTOMY    . radical prostatectomy    . TONSILLECTOMY       SOCIAL HISTORY:  Social History   Social History  . Marital status: Widowed  Spouse name: N/A  . Number of children: 3  . Years of education: 9   Occupational History  . handyman- retired     [employer]   Social History Main Topics  . Smoking status: Former Research scientist (life sciences)  . Smokeless tobacco: Never Used  . Alcohol use No  . Drug use: No  . Sexual activity: Not Currently   Other Topics Concern  . Not on file   Social History Narrative   Widowed   [place] with son   Chronic low back pain    FAMILY HISTORY:  Family History  Problem Relation Age of  Onset  . Heart failure Mother     CHF  . Prostate cancer Father     CURRENT MEDICATIONS:  Outpatient Encounter Prescriptions as of 01/07/2017  Medication Sig  . benazepril (LOTENSIN) 20 MG tablet Take 1 tablet (20 mg total) by mouth daily.  . Calcium Carb-Cholecalciferol (CALCIUM-VITAMIN D) 500-200 MG-UNIT tablet Take 1 tablet by mouth daily.  . cetirizine (ZYRTEC) 10 MG tablet Take 1 tablet (10 mg total) by mouth daily.  Water engineer Bandages & Supports (LUMBAR BACK BRACE/SUPPORT PAD) MISC 1 Units by Other route daily as needed. Use back brace with activity to reduce pain  . hydrochlorothiazide (HYDRODIURIL) 25 MG tablet Take 1 tablet (25 mg total) by mouth daily.  . metFORMIN (GLUCOPHAGE) 500 MG tablet Take 1 tablet (500 mg total) by mouth daily with breakfast. For weight and sugar  . methylphenidate (RITALIN) 20 MG tablet Take 1 tablet (20 mg total) by mouth daily.  . methylPREDNISolone (MEDROL DOSEPAK) 4 MG TBPK tablet Tad  . omega-3 acid ethyl esters (LOVAZA) 1 g capsule Take 2 g by mouth 2 (two) times daily.  Marland Kitchen omeprazole (PRILOSEC) 40 MG capsule Take 40 mg by mouth daily.  Marland Kitchen tiZANidine (ZANAFLEX) 4 MG capsule Take 1 capsule (4 mg total) by mouth 2 (two) times daily.  Marland Kitchen tiZANidine (ZANAFLEX) 4 MG tablet Take 4 mg by mouth 2 (two) times daily.  . traMADol (ULTRAM) 50 MG tablet Take 1 tablet (50 mg total) by mouth every 6 (six) hours as needed. Pain  . [EXPIRED] ciprofloxacin (CIPRO) 500 MG tablet Take 1 tablet (500 mg total) by mouth 2 (two) times daily.  . [EXPIRED] denosumab (XGEVA) injection 120 mg    No facility-administered encounter medications on file as of 01/07/2017.     ALLERGIES:  No Known Allergies   PHYSICAL EXAM:  ECOG Performance status: 1 - Symptomatic, but independent.   Vitals:   01/07/17 1014  BP: (!) 149/85  Pulse: 72  Resp: 18  Temp: 98.1 F (36.7 C)   Filed Weights   01/07/17 1014  Weight: 276 lb (125.2 kg)    Physical Exam  Constitutional: He is  oriented to person, place, and time and well-developed, well-nourished, and in no distress.  HENT:  Head: Normocephalic.  Mouth/Throat: Oropharynx is clear and moist. No oropharyngeal exudate.  Eyes: Conjunctivae are normal. Pupils are equal, round, and reactive to light. No scleral icterus.  Neck: Normal range of motion. Neck supple.  Cardiovascular: Normal rate, regular rhythm and normal heart sounds.   Pulmonary/Chest: Effort normal and breath sounds normal. No respiratory distress.  Abdominal: Soft. Bowel sounds are normal. There is no tenderness.  Musculoskeletal: Normal range of motion. He exhibits no edema.  Lymphadenopathy:    He has no cervical adenopathy.       Right: No supraclavicular adenopathy present.       Left: No supraclavicular adenopathy  present.  Neurological: He is alert and oriented to person, place, and time. No cranial nerve deficit. Gait normal.  Skin: Skin is warm and dry. No rash noted.  Psychiatric: Mood, memory, affect and judgment normal.  Nursing note and vitals reviewed.    LABORATORY DATA:  I have reviewed the labs as listed.  CBC    Component Value Date/Time   WBC 5.8 01/07/2017 0857   RBC 4.28 01/07/2017 0857   HGB 12.5 (L) 01/07/2017 0857   HCT 36.7 (L) 01/07/2017 0857   PLT 171 01/07/2017 0857   MCV 85.7 01/07/2017 0857   MCH 29.2 01/07/2017 0857   MCHC 34.1 01/07/2017 0857   RDW 14.3 01/07/2017 0857   LYMPHSABS 2.6 01/07/2017 0857   MONOABS 0.3 01/07/2017 0857   EOSABS 0.3 01/07/2017 0857   BASOSABS 0.0 01/07/2017 0857   CMP Latest Ref Rng & Units 01/07/2017 12/10/2016 11/11/2016  Glucose 65 - 99 mg/dL 111(H) 104(H) 73  BUN 6 - 20 mg/dL 14 14 11   Creatinine 0.61 - 1.24 mg/dL 1.18 1.14 1.18  Sodium 135 - 145 mmol/L 135 136 137  Potassium 3.5 - 5.1 mmol/L 3.9 3.8 3.7  Chloride 101 - 111 mmol/L 99(L) 96(L) 101  CO2 22 - 32 mmol/L 29 30 28   Calcium 8.9 - 10.3 mg/dL 9.2 9.9 8.7(L)  Total Protein 6.5 - 8.1 g/dL 6.9 7.3 6.7  Total  Bilirubin 0.3 - 1.2 mg/dL 0.5 0.8 0.6  Alkaline Phos 38 - 126 U/L 48 49 49  AST 15 - 41 U/L 30 33 28  ALT 17 - 63 U/L 24 25 22    Results for [name], [name] (MRN [id number])   Ref. Range 07/02/2016 13:25 08/06/2016 10:49 09/03/2016 10:11 11/11/2016 09:40 12/10/2016 10:00  PSA Latest Ref Range: 0.00 - 4.00 ng/mL 0.15 0.12 0.10 0.10 0.13    Results for [name], [name] (MRN [id number])   Ref. Range 01/07/2017 08:57  PSA Latest Ref Range: 0.00 - 4.00 ng/mL 0.15   PENDING LABS:    DIAGNOSTIC IMAGING:  Bone scan: 12/31/15    PATHOLOGY:     ASSESSMENT & PLAN:   Stage IV adenocarcinoma of prostate with bone mets:  -He has not been evaluated by new urologist yet. Likely will need to resume androgen-deprivation therapy, but will defer to urologist.   -Last PSA 0.13 on 12/10/16, which is largely stable from previous PSAs noted in 08/2016 (PSA 0.1) and 10/2016 (PSA 0.1).  -PSA resulted after patient left office and is noted to be stable at 0.15. There have been very slight elevations in PSA in the past several months, which could be secondary to patient missing Lupron injections.  PSA not significantly elevated at this time and we will continue to monitor.   -Restaging imaging may be warranted if continued increase in PSA noted at next follow-up visit.  -Return to cancer center in 3 months for continued surveillance.   Bone mets:  -Continue monthly Xgeva.   Urinary incontinence:  -Likely secondary to prostatectomy, adjuvant radiation therapy, and recent UTI.  -Encouraged him to discuss further with urology.   Erectile dysfunction:  -It could be appropriate for [name] to try Viagra or Cialis. However, I will defer to urology for their expertise and recommendations.   Wellness promotion:  -Encouraged healthy diet and exercise. Encouraged his plan to start exercising more regularly; this will also be helpful in promoting bone strength in patients treated with androgen-deprivation.       Dispo:  -Continue monthly Xgeva.  -  Return to cancer center in 3 months for continued surveillance.    All questions were answered to patient's stated satisfaction. Encouraged patient to call with any new concerns or questions before his next visit to the cancer center and we can certain see him sooner, if needed.        Orders placed this encounter:  No orders of the defined types were placed in this encounter.     [name], NP [place] [phone]

## 2017-01-07 ENCOUNTER — Encounter (HOSPITAL_COMMUNITY): Payer: Medicare Other

## 2017-01-07 ENCOUNTER — Encounter (HOSPITAL_BASED_OUTPATIENT_CLINIC_OR_DEPARTMENT_OTHER): Payer: Medicare Other

## 2017-01-07 ENCOUNTER — Encounter (HOSPITAL_COMMUNITY): Payer: Medicare Other | Attending: Adult Health | Admitting: Adult Health

## 2017-01-07 ENCOUNTER — Encounter (HOSPITAL_COMMUNITY): Payer: Self-pay | Admitting: Adult Health

## 2017-01-07 VITALS — BP 149/85 | HR 72 | Temp 98.1°F | Resp 18 | Wt 276.0 lb

## 2017-01-07 DIAGNOSIS — C7951 Secondary malignant neoplasm of bone: Secondary | ICD-10-CM

## 2017-01-07 DIAGNOSIS — Z79899 Other long term (current) drug therapy: Secondary | ICD-10-CM | POA: Insufficient documentation

## 2017-01-07 DIAGNOSIS — Z87891 Personal history of nicotine dependence: Secondary | ICD-10-CM | POA: Diagnosis not present

## 2017-01-07 DIAGNOSIS — Z8546 Personal history of malignant neoplasm of prostate: Secondary | ICD-10-CM | POA: Insufficient documentation

## 2017-01-07 DIAGNOSIS — Z8249 Family history of ischemic heart disease and other diseases of the circulatory system: Secondary | ICD-10-CM | POA: Diagnosis not present

## 2017-01-07 DIAGNOSIS — M545 Low back pain: Secondary | ICD-10-CM | POA: Diagnosis not present

## 2017-01-07 DIAGNOSIS — Z9049 Acquired absence of other specified parts of digestive tract: Secondary | ICD-10-CM | POA: Diagnosis not present

## 2017-01-07 DIAGNOSIS — K219 Gastro-esophageal reflux disease without esophagitis: Secondary | ICD-10-CM | POA: Insufficient documentation

## 2017-01-07 DIAGNOSIS — N39498 Other specified urinary incontinence: Secondary | ICD-10-CM | POA: Insufficient documentation

## 2017-01-07 DIAGNOSIS — I10 Essential (primary) hypertension: Secondary | ICD-10-CM | POA: Diagnosis not present

## 2017-01-07 DIAGNOSIS — G8929 Other chronic pain: Secondary | ICD-10-CM | POA: Diagnosis not present

## 2017-01-07 DIAGNOSIS — E291 Testicular hypofunction: Secondary | ICD-10-CM

## 2017-01-07 DIAGNOSIS — Z7984 Long term (current) use of oral hypoglycemic drugs: Secondary | ICD-10-CM | POA: Diagnosis not present

## 2017-01-07 DIAGNOSIS — Z923 Personal history of irradiation: Secondary | ICD-10-CM | POA: Diagnosis not present

## 2017-01-07 DIAGNOSIS — N529 Male erectile dysfunction, unspecified: Secondary | ICD-10-CM | POA: Diagnosis not present

## 2017-01-07 DIAGNOSIS — R32 Unspecified urinary incontinence: Secondary | ICD-10-CM | POA: Diagnosis not present

## 2017-01-07 DIAGNOSIS — C61 Malignant neoplasm of prostate: Secondary | ICD-10-CM | POA: Diagnosis not present

## 2017-01-07 DIAGNOSIS — E785 Hyperlipidemia, unspecified: Secondary | ICD-10-CM | POA: Diagnosis not present

## 2017-01-07 DIAGNOSIS — Z8042 Family history of malignant neoplasm of prostate: Secondary | ICD-10-CM | POA: Insufficient documentation

## 2017-01-07 DIAGNOSIS — Z9889 Other specified postprocedural states: Secondary | ICD-10-CM | POA: Insufficient documentation

## 2017-01-07 DIAGNOSIS — G4730 Sleep apnea, unspecified: Secondary | ICD-10-CM | POA: Insufficient documentation

## 2017-01-07 LAB — COMPREHENSIVE METABOLIC PANEL
ALBUMIN: 4 g/dL (ref 3.5–5.0)
ALT: 24 U/L (ref 17–63)
ANION GAP: 7 (ref 5–15)
AST: 30 U/L (ref 15–41)
Alkaline Phosphatase: 48 U/L (ref 38–126)
BUN: 14 mg/dL (ref 6–20)
CHLORIDE: 99 mmol/L — AB (ref 101–111)
CO2: 29 mmol/L (ref 22–32)
Calcium: 9.2 mg/dL (ref 8.9–10.3)
Creatinine, Ser: 1.18 mg/dL (ref 0.61–1.24)
GFR calc non Af Amer: 58 mL/min — ABNORMAL LOW (ref >60)
GLUCOSE: 111 mg/dL — AB (ref 65–99)
Potassium: 3.9 mmol/L (ref 3.5–5.1)
SODIUM: 135 mmol/L (ref 135–145)
Total Bilirubin: 0.5 mg/dL (ref 0.3–1.2)
Total Protein: 6.9 g/dL (ref 6.5–8.1)

## 2017-01-07 LAB — CBC WITH DIFFERENTIAL/PLATELET
BASOS ABS: 0 10*3/uL (ref 0.0–0.1)
BASOS PCT: 0 %
EOS ABS: 0.3 10*3/uL (ref 0.0–0.7)
EOS PCT: 4 %
HCT: 36.7 % — ABNORMAL LOW (ref 39.0–52.0)
Hemoglobin: 12.5 g/dL — ABNORMAL LOW (ref 13.0–17.0)
Lymphocytes Relative: 44 %
Lymphs Abs: 2.6 10*3/uL (ref 0.7–4.0)
MCH: 29.2 pg (ref 26.0–34.0)
MCHC: 34.1 g/dL (ref 30.0–36.0)
MCV: 85.7 fL (ref 78.0–100.0)
MONO ABS: 0.3 10*3/uL (ref 0.1–1.0)
Monocytes Relative: 5 %
NEUTROS ABS: 2.7 10*3/uL (ref 1.7–7.7)
Neutrophils Relative %: 47 %
PLATELETS: 171 10*3/uL (ref 150–400)
RBC: 4.28 MIL/uL (ref 4.22–5.81)
RDW: 14.3 % (ref 11.5–15.5)
WBC: 5.8 10*3/uL (ref 4.0–10.5)

## 2017-01-07 LAB — PSA: PSA: 0.15 ng/mL (ref 0.00–4.00)

## 2017-01-07 MED ORDER — DENOSUMAB 120 MG/1.7ML ~~LOC~~ SOLN
120.0000 mg | Freq: Once | SUBCUTANEOUS | Status: AC
  Administered 2017-01-07: 120 mg via SUBCUTANEOUS
  Filled 2017-01-07: qty 1.7

## 2017-01-07 NOTE — Progress Notes (Signed)
[name] presents today for injection per MD orders. Xgeva 120 mg administered SQ in left Abdomen. Administration without incident. Patient tolerated well.

## 2017-01-07 NOTE — Patient Instructions (Signed)
[place] at [facility] Discharge Instructions  RECOMMENDATIONS MADE BY THE CONSULTANT AND ANY TEST RESULTS WILL BE SENT TO YOUR REFERRING PHYSICIAN.  You were seen today by [name] NP. Keep your Urology appointment in April. Continue monthly labs and xgeva injection. Return in 3 months for follow up.   Thank you for choosing [facility] to provide your oncology and hematology care.  To afford each patient quality time with our provider, please arrive at least 15 minutes before your scheduled appointment time.    If you have a lab appointment with the [facility] please come in thru the  Main Entrance and check in at the main information desk  You need to re-schedule your appointment should you arrive 10 or more minutes late.  We strive to give you quality time with our providers, and arriving late affects you and other patients whose appointments are after yours.  Also, if you no show three or more times for appointments you may be dismissed from the clinic at the providers discretion.     Again, thank you for choosing [facility].  Our hope is that these requests will decrease the amount of time that you wait before being seen by our physicians.       _____________________________________________________________  Should you have questions after your visit to [facility], please contact our office at [phone] between the hours of 8:30 a.m. and 4:30 p.m.  Voicemails left after 4:30 p.m. will not be returned until the following business day.  For prescription refill requests, have your pharmacy contact our office.       Resources For Cancer Patients and their Caregivers ? American Cancer Society: Can assist with transportation, wigs, general needs, runs Look Good Feel Better.        252-384-0402 ? Cancer Care: Provides financial assistance, online support groups, medication/co-pay  assistance.  1-800-813-HOPE (972) 100-0190) ? [place] Assists [place] cancer patients and their families through emotional , educational and financial support.  513-524-9006 ? [place] DSS Where to apply for food stamps, Medicaid and utility assistance. 405-680-0775 ? [facility]: Transportation to medical appointments. 971-503-0685 ? Social Security Administration: May apply for disability if have a Stage IV cancer. 302-452-7909 717-558-9744 ? [employer], Disability and Transit Services: Assists with nutrition, care and transit needs. [facility] Support Programs: @10RELATIVEDAYS @ > Cancer Support Group  2nd Tuesday of the month 1pm-2pm, Journey Room  > Creative Journey  3rd Tuesday of the month 1130am-1pm, Journey Room  > Look Good Feel Better  1st Wednesday of the month 10am-12 noon, Journey Room (Call [place] to register [phone])

## 2017-01-21 DIAGNOSIS — M170 Bilateral primary osteoarthritis of knee: Secondary | ICD-10-CM | POA: Diagnosis not present

## 2017-01-21 DIAGNOSIS — M545 Low back pain: Secondary | ICD-10-CM | POA: Diagnosis not present

## 2017-01-22 ENCOUNTER — Ambulatory Visit: Payer: Medicare Other | Admitting: Family Medicine

## 2017-01-29 DIAGNOSIS — H43813 Vitreous degeneration, bilateral: Secondary | ICD-10-CM | POA: Diagnosis not present

## 2017-01-29 DIAGNOSIS — H524 Presbyopia: Secondary | ICD-10-CM | POA: Diagnosis not present

## 2017-02-02 ENCOUNTER — Other Ambulatory Visit: Payer: Self-pay | Admitting: Family Medicine

## 2017-02-02 NOTE — Telephone Encounter (Signed)
Mr. Talton is calling requesting refills on medications traMADol (ULTRAM) 50 MG tablet ,methylphenidate (RITALIN) 20 MG tablet ,benazepril (LOTENSIN) 20 MG tablet , hydrochlorothiazide (HYDRODIURIL) 25 MG tablet,cetirizine (ZYRTEC) 10 MG tablet

## 2017-02-03 ENCOUNTER — Other Ambulatory Visit (HOSPITAL_COMMUNITY): Payer: Self-pay | Admitting: *Deleted

## 2017-02-03 DIAGNOSIS — C61 Malignant neoplasm of prostate: Secondary | ICD-10-CM

## 2017-02-03 DIAGNOSIS — C7951 Secondary malignant neoplasm of bone: Secondary | ICD-10-CM

## 2017-02-03 MED ORDER — TRAMADOL HCL 50 MG PO TABS: 50.0000 mg | ORAL_TABLET | Freq: Four times a day (QID) | ORAL | 0 refills | Status: DC | PRN

## 2017-02-03 MED ORDER — METHYLPHENIDATE HCL 20 MG PO TABS: 20.0000 mg | ORAL_TABLET | Freq: Every day | ORAL | 0 refills | Status: DC

## 2017-02-03 NOTE — Telephone Encounter (Signed)
Last refills?

## 2017-02-03 NOTE — Telephone Encounter (Signed)
Seen 2 16 18

## 2017-02-04 ENCOUNTER — Other Ambulatory Visit: Payer: Self-pay

## 2017-02-04 ENCOUNTER — Ambulatory Visit (HOSPITAL_COMMUNITY): Payer: Medicare Other

## 2017-02-04 ENCOUNTER — Other Ambulatory Visit (HOSPITAL_COMMUNITY): Payer: Medicare Other

## 2017-02-04 ENCOUNTER — Ambulatory Visit (INDEPENDENT_AMBULATORY_CARE_PROVIDER_SITE_OTHER): Payer: Medicare Other

## 2017-02-04 VITALS — BP 136/78 | HR 68 | Temp 98.6°F | Ht 69.0 in | Wt 277.1 lb

## 2017-02-04 DIAGNOSIS — Z0000 Encounter for general adult medical examination without abnormal findings: Secondary | ICD-10-CM

## 2017-02-04 MED ORDER — TIZANIDINE HCL 4 MG PO CAPS: 4.0000 mg | ORAL_CAPSULE | Freq: Two times a day (BID) | ORAL | 0 refills | Status: DC

## 2017-02-04 NOTE — Progress Notes (Signed)
Subjective:   [name] is a 76 y.o. male who presents for Medicare Annual/Subsequent preventive examination.  Review of Systems:  Cardiac Risk Factors include: advanced age (>32men, >65 women);dyslipidemia;hypertension;male gender;obesity (BMI >30kg/m2);sedentary lifestyle     Objective:    Vitals: BP 136/78   Pulse 68   Temp 98.6 F (37 C) (Oral)   Ht 5\' 9"  (1.753 m)   Wt 277 lb 1.3 oz (125.7 kg)   SpO2 96%   BMI 40.92 kg/m   Body mass index is 40.92 kg/m.  Tobacco History  Smoking Status  . Former Smoker  . Packs/day: 1.00  . Years: 32.00  . Types: Cigarettes  . Quit date: 02/04/1989  Smokeless Tobacco  . Never Used     Counseling given: Not Answered   Past Medical History:  Diagnosis Date  . Cancer [facility])    prostate  . GERD (gastroesophageal reflux disease)   . Hyperlipidemia   . Hypertension   . Prostate cancer metastatic to bone ([facility]) 04/04/2016  . Sleep apnea 10/24/2016   Past Surgical History:  Procedure Laterality Date  . CHOLECYSTECTOMY    . HAND SURGERY    . INGUINAL HERNIA REPAIR Left   . PROSTATECTOMY    . PROSTATECTOMY    . radical prostatectomy    . TONSILLECTOMY     Family History  Problem Relation Age of Onset  . Heart failure Mother     CHF  . Prostate cancer Father   . Narcolepsy Sister   . Cancer Daughter    History  Sexual Activity  . Sexual activity: Not Currently    Outpatient Encounter Prescriptions as of 02/04/2017  Medication Sig  . benazepril (LOTENSIN) 20 MG tablet Take 1 tablet (20 mg total) by mouth daily.  . Calcium Carb-Cholecalciferol (CALCIUM-VITAMIN D) 500-200 MG-UNIT tablet Take 1 tablet by mouth daily.  . cetirizine (ZYRTEC) 10 MG tablet Take 1 tablet (10 mg total) by mouth daily. (Patient taking differently: Take 10 mg by mouth daily as needed for allergies. )  . Elastic Bandages & Supports (LUMBAR BACK BRACE/SUPPORT PAD) MISC 1 Units by Other route daily as needed. Use back brace with activity to reduce  pain  . hydrochlorothiazide (HYDRODIURIL) 25 MG tablet Take 1 tablet (25 mg total) by mouth daily.  . meloxicam (MOBIC) 7.5 MG tablet Take 7.5 mg by mouth daily.  . metFORMIN (GLUCOPHAGE) 500 MG tablet Take 1 tablet (500 mg total) by mouth daily with breakfast. For weight and sugar  . methylphenidate (RITALIN) 20 MG tablet Take 1 tablet (20 mg total) by mouth daily.  Marland Kitchen omega-3 acid ethyl esters (LOVAZA) 1 g capsule Take 2 g by mouth 2 (two) times daily.  Marland Kitchen tiZANidine (ZANAFLEX) 4 MG capsule Take 1 capsule (4 mg total) by mouth 2 (two) times daily.  . traMADol (ULTRAM) 50 MG tablet Take 1 tablet (50 mg total) by mouth every 6 (six) hours as needed. Pain  . methylPREDNISolone (MEDROL DOSEPAK) 4 MG TBPK tablet Tad (Patient not taking: Reported on 02/04/2017)  . omeprazole (PRILOSEC) 40 MG capsule Take 40 mg by mouth daily.  . [DISCONTINUED] tiZANidine (ZANAFLEX) 4 MG tablet Take 4 mg by mouth 2 (two) times daily.   No facility-administered encounter medications on file as of 02/04/2017.     Activities of Daily Living In your present state of health, do you have any difficulty performing the following activities: 02/04/2017 12/30/2016  Hearing? Y N  Vision? N N  Difficulty concentrating or making decisions?  N N  Walking or climbing stairs? N N  Dressing or bathing? N N  Doing errands, shopping? N N  Preparing Food and eating ? N -  Using the Toilet? N -  In the past six months, have you accidently leaked urine? N -  Do you have problems with loss of bowel control? N -  Managing your Medications? N -  Managing your Finances? N -  Housekeeping or managing your Housekeeping? N -  Some recent data might be hidden    Patient Care Team: [name], MD as PCP - General (Family Medicine)   Assessment:    Exercise Activities and Dietary recommendations Current Exercise Habits: The patient does not participate in regular exercise at present, Exercise limited by: None identified  Goals     . Exercise 3x per week (30 min per time)          Recommend starting a routine exercise program at least 3 days a week for 30-45 minutes at a time as tolerated.      . walk every day that you are able    . Weight (lb) < 200 lb (90.7 kg)      Fall Risk Fall Risk  02/04/2017 12/30/2016 10/24/2016  Falls in the past year? No No No   Depression Screen PHQ 2/9 Scores 02/04/2017 12/30/2016 10/24/2016  PHQ - 2 Score 0 0 0    Cognitive Function: Normal by direct observation    Immunization History  Administered Date(s) Administered  . Influenza Whole 10/04/2016  . Pneumococcal Conjugate-13 07/15/2014  . Pneumococcal Polysaccharide-23 07/30/2015   Screening Tests Health Maintenance  Topic Date Due  . TETANUS/TDAP  09/03/1958  . INFLUENZA VACCINE  05/27/2017  . PNA vac Low Risk Adult  Completed      Plan:  I have personally reviewed and addressed the Medicare Annual Wellness questionnaire and have noted the following in the patient's chart:  A. Medical and social history B. Use of alcohol, tobacco or illicit drugs  C. Current medications and supplements D. Functional ability and status E.  Nutritional status F.  Physical activity G. Advance directives H. List of other physicians I.  Hospitalizations, surgeries, and ER visits in previous 12 months J.  [place] to include cognitive, depression, and falls L. Referrals and appointments - none  In addition, I have reviewed and discussed with patient certain preventive protocols, quality metrics, and best practice recommendations. A written personalized care plan for preventive services as well as general preventive health recommendations were provided to patient.  Signed,   Stormy Fabian, LPN Lead Nurse Health Advisor

## 2017-02-04 NOTE — Patient Instructions (Addendum)
[name] , Thank you for taking time to come for your Medicare Wellness Visit. I appreciate your ongoing commitment to your health goals. Please review the following plan we discussed and let me know if I can assist you in the future.   Screening recommendations/referrals: Colonoscopy: No longer required Recommended yearly ophthalmology/optometry visit for glaucoma screening and checkup Recommended yearly dental visit for hygiene and checkup  Vaccinations: Influenza vaccine: Up to date, next due 05/2017 Pneumococcal vaccine: Up to date Tdap vaccine: Due now, check with your insurance company to see if this is covered.  Shingles vaccine: Due now, check with your insurance company to see if this is covered.    Advanced directives: Please bring a copy of your POA (Power of Attorney) and/or Living Will to your next appointment.   Conditions/risks identified: Obese, recommend starting a routine exercise program at least 3 days a week for 30-45 minutes at a time as tolerated.   Next appointment: Follow up with Dr. Meda Coffee on 03/11/2017 at 8:40 am. Follow up in 1 year for your annual wellness visit.   Preventive Care 85 Years and Older, Male Preventive care refers to lifestyle choices and visits with your health care provider that can promote health and wellness. What does preventive care include?  A yearly physical exam. This is also called an annual well check.  Dental exams once or twice a year.  Routine eye exams. Ask your health care provider how often you should have your eyes checked.  Personal lifestyle choices, including:  Daily care of your teeth and gums.  Regular physical activity.  Eating a healthy diet.  Avoiding tobacco and drug use.  Limiting alcohol use.  Practicing safe sex.  Taking low doses of aspirin every day.  Taking vitamin and mineral supplements as recommended by your health care provider. What happens during an annual well check? The services and  screenings done by your health care provider during your annual well check will depend on your age, overall health, lifestyle risk factors, and family history of disease. Counseling  Your health care provider may ask you questions about your:  Alcohol use.  Tobacco use.  Drug use.  Emotional well-being.  Home and relationship well-being.  Sexual activity.  Eating habits.  History of falls.  Memory and ability to understand (cognition).  Work and work Statistician. Screening  You may have the following tests or measurements:  Height, weight, and BMI.  Blood pressure.  Lipid and cholesterol levels. These may be checked every 5 years, or more frequently if you are over 67 years old.  Skin check.  Lung cancer screening. You may have this screening every year starting at age 27 if you have a 30-pack-year history of smoking and currently smoke or have quit within the past 15 years.  Fecal occult blood test (FOBT) of the stool. You may have this test every year starting at age 40.  Flexible sigmoidoscopy or colonoscopy. You may have a sigmoidoscopy every 5 years or a colonoscopy every 10 years starting at age 18.  Prostate cancer screening. Recommendations will vary depending on your family history and other risks.  Hepatitis C blood test.  Hepatitis B blood test.  Sexually transmitted disease (STD) testing.  Diabetes screening. This is done by checking your blood sugar (glucose) after you have not eaten for a while (fasting). You may have this done every 1-3 years.  Abdominal aortic aneurysm (AAA) screening. You may need this if you are a current or former smoker.  Osteoporosis. You may be screened starting at age 31 if you are at high risk. Talk with your health care provider about your test results, treatment options, and if necessary, the need for more tests. Vaccines  Your health care provider may recommend certain vaccines, such as:  Influenza vaccine. This is  recommended every year.  Tetanus, diphtheria, and acellular pertussis (Tdap, Td) vaccine. You may need a Td booster every 10 years.  Zoster vaccine. You may need this after age 56.  Pneumococcal 13-valent conjugate (PCV13) vaccine. One dose is recommended after age 2.  Pneumococcal polysaccharide (PPSV23) vaccine. One dose is recommended after age 37. Talk to your health care provider about which screenings and vaccines you need and how often you need them. This information is not intended to replace advice given to you by your health care provider. Make sure you discuss any questions you have with your health care provider. Document Released: 11/09/2015 Document Revised: 07/02/2016 Document Reviewed: 08/14/2015 Elsevier Interactive Patient Education  2017 [place] Prevention in the Home Falls can cause injuries. They can happen to people of all ages. There are many things you can do to make your home safe and to help prevent falls. What can I do on the outside of my home?  Regularly fix the edges of walkways and driveways and fix any cracks.  Remove anything that might make you trip as you walk through a door, such as a raised step or threshold.  Trim any bushes or trees on the path to your home.  Use bright outdoor lighting.  Clear any walking paths of anything that might make someone trip, such as rocks or tools.  Regularly check to see if handrails are loose or broken. Make sure that both sides of any steps have handrails.  Any raised decks and porches should have guardrails on the edges.  Have any leaves, snow, or ice cleared regularly.  Use sand or salt on walking paths during winter.  Clean up any spills in your garage right away. This includes oil or grease spills. What can I do in the bathroom?  Use night lights.  Install grab bars by the toilet and in the tub and shower. Do not use towel bars as grab bars.  Use non-skid mats or decals in the tub or  shower.  If you need to sit down in the shower, use a plastic, non-slip stool.  Keep the floor dry. Clean up any water that spills on the floor as soon as it happens.  Remove soap buildup in the tub or shower regularly.  Attach bath mats securely with double-sided non-slip rug tape.  Do not have throw rugs and other things on the floor that can make you trip. What can I do in the bedroom?  Use night lights.  Make sure that you have a light by your bed that is easy to reach.  Do not use any sheets or blankets that are too big for your bed. They should not hang down onto the floor.  Have a firm chair that has side arms. You can use this for support while you get dressed.  Do not have throw rugs and other things on the floor that can make you trip. What can I do in the kitchen?  Clean up any spills right away.  Avoid walking on wet floors.  Keep items that you use a lot in easy-to-reach places.  If you need to reach something above you, use a strong step  stool that has a grab bar.  Keep electrical cords out of the way.  Do not use floor polish or wax that makes floors slippery. If you must use wax, use non-skid floor wax.  Do not have throw rugs and other things on the floor that can make you trip. What can I do with my stairs?  Do not leave any items on the stairs.  Make sure that there are handrails on both sides of the stairs and use them. Fix handrails that are broken or loose. Make sure that handrails are as long as the stairways.  Check any carpeting to make sure that it is firmly attached to the stairs. Fix any carpet that is loose or worn.  Avoid having throw rugs at the top or bottom of the stairs. If you do have throw rugs, attach them to the floor with carpet tape.  Make sure that you have a light switch at the top of the stairs and the bottom of the stairs. If you do not have them, ask someone to add them for you. What else can I do to help prevent  falls?  Wear shoes that:  Do not have high heels.  Have rubber bottoms.  Are comfortable and fit you well.  Are closed at the toe. Do not wear sandals.  If you use a stepladder:  Make sure that it is fully opened. Do not climb a closed stepladder.  Make sure that both sides of the stepladder are locked into place.  Ask someone to hold it for you, if possible.  Clearly mark and make sure that you can see:  Any grab bars or handrails.  First and last steps.  Where the edge of each step is.  Use tools that help you move around (mobility aids) if they are needed. These include:  Canes.  Walkers.  Scooters.  Crutches.  Turn on the lights when you go into a dark area. Replace any light bulbs as soon as they burn out.  Set up your furniture so you have a clear path. Avoid moving your furniture around.  If any of your floors are uneven, fix them.  If there are any pets around you, be aware of where they are.  Review your medicines with your doctor. Some medicines can make you feel dizzy. This can increase your chance of falling. Ask your doctor what other things that you can do to help prevent falls. This information is not intended to replace advice given to you by your health care provider. Make sure you discuss any questions you have with your health care provider. Document Released: 08/09/2009 Document Revised: 03/20/2016 Document Reviewed: 11/17/2014 Elsevier Interactive Patient Education  2017 [employer].

## 2017-02-05 ENCOUNTER — Encounter (HOSPITAL_COMMUNITY): Payer: Medicare Other | Attending: Hematology & Oncology

## 2017-02-05 ENCOUNTER — Encounter (HOSPITAL_COMMUNITY): Payer: Medicare Other

## 2017-02-05 VITALS — BP 136/75 | HR 73 | Temp 98.4°F | Resp 20

## 2017-02-05 DIAGNOSIS — C7951 Secondary malignant neoplasm of bone: Secondary | ICD-10-CM

## 2017-02-05 DIAGNOSIS — C61 Malignant neoplasm of prostate: Secondary | ICD-10-CM

## 2017-02-05 LAB — CBC WITH DIFFERENTIAL/PLATELET
Basophils Absolute: 0 10*3/uL (ref 0.0–0.1)
Basophils Relative: 0 %
EOS ABS: 0.2 10*3/uL (ref 0.0–0.7)
EOS PCT: 3 %
HCT: 35.6 % — ABNORMAL LOW (ref 39.0–52.0)
HEMOGLOBIN: 12.1 g/dL — AB (ref 13.0–17.0)
LYMPHS ABS: 2.2 10*3/uL (ref 0.7–4.0)
Lymphocytes Relative: 36 %
MCH: 29.3 pg (ref 26.0–34.0)
MCHC: 34 g/dL (ref 30.0–36.0)
MCV: 86.2 fL (ref 78.0–100.0)
MONOS PCT: 5 %
Monocytes Absolute: 0.3 10*3/uL (ref 0.1–1.0)
NEUTROS PCT: 56 %
Neutro Abs: 3.4 10*3/uL (ref 1.7–7.7)
PLATELETS: 143 10*3/uL — AB (ref 150–400)
RBC: 4.13 MIL/uL — ABNORMAL LOW (ref 4.22–5.81)
RDW: 14.7 % (ref 11.5–15.5)
WBC: 6.1 10*3/uL (ref 4.0–10.5)

## 2017-02-05 LAB — COMPREHENSIVE METABOLIC PANEL
ALBUMIN: 4.1 g/dL (ref 3.5–5.0)
ALK PHOS: 53 U/L (ref 38–126)
ALT: 25 U/L (ref 17–63)
ANION GAP: 7 (ref 5–15)
AST: 35 U/L (ref 15–41)
BUN: 11 mg/dL (ref 6–20)
CHLORIDE: 103 mmol/L (ref 101–111)
CO2: 29 mmol/L (ref 22–32)
Calcium: 9.5 mg/dL (ref 8.9–10.3)
Creatinine, Ser: 1.15 mg/dL (ref 0.61–1.24)
GFR calc non Af Amer: 60 mL/min — ABNORMAL LOW (ref >60)
GLUCOSE: 94 mg/dL (ref 65–99)
POTASSIUM: 3.7 mmol/L (ref 3.5–5.1)
SODIUM: 139 mmol/L (ref 135–145)
Total Bilirubin: 0.6 mg/dL (ref 0.3–1.2)
Total Protein: 6.6 g/dL (ref 6.5–8.1)

## 2017-02-05 LAB — PSA: PSA: 0.16 ng/mL (ref 0.00–4.00)

## 2017-02-05 MED ORDER — DENOSUMAB 120 MG/1.7ML ~~LOC~~ SOLN
120.0000 mg | Freq: Once | SUBCUTANEOUS | Status: AC
  Administered 2017-02-05: 120 mg via SUBCUTANEOUS
  Filled 2017-02-05: qty 1.7

## 2017-02-05 NOTE — Patient Instructions (Signed)
[place] at [facility] Discharge Instructions  RECOMMENDATIONS MADE BY THE CONSULTANT AND ANY TEST RESULTS WILL BE SENT TO YOUR REFERRING PHYSICIAN.  Xgeva 120 mg injection given as ordered. Return as scheduled every 28 days for lab work prior to xgeva injection.  Thank you for choosing [place] at [facility] to provide your oncology and hematology care.  To afford each patient quality time with our provider, please arrive at least 15 minutes before your scheduled appointment time.    If you have a lab appointment with the [facility] please come in thru the  Main Entrance and check in at the main information desk  You need to re-schedule your appointment should you arrive 10 or more minutes late.  We strive to give you quality time with our providers, and arriving late affects you and other patients whose appointments are after yours.  Also, if you no show three or more times for appointments you may be dismissed from the clinic at the providers discretion.     Again, thank you for choosing [facility].  Our hope is that these requests will decrease the amount of time that you wait before being seen by our physicians.       _____________________________________________________________  Should you have questions after your visit to [facility], please contact our office at [phone] between the hours of 8:30 a.m. and 4:30 p.m.  Voicemails left after 4:30 p.m. will not be returned until the following business day.  For prescription refill requests, have your pharmacy contact our office.       Resources For Cancer Patients and their Caregivers ? American Cancer Society: Can assist with transportation, wigs, general needs, runs Look Good Feel Better.        2046902945 ? Cancer Care: Provides financial assistance, online support groups, medication/co-pay assistance.  1-800-813-HOPE 570-549-6325) ? [facility] Assists [place] cancer patients and their families through emotional , educational and financial support.  (317) 060-4464 ? [place] DSS Where to apply for food stamps, Medicaid and utility assistance. 939-847-1526 ? [facility]: Transportation to medical appointments. 218-688-6344 ? Social Security Administration: May apply for disability if have a Stage IV cancer. 276 494 4842 628-824-1195 ? [employer], Disability and Transit Services: Assists with nutrition, care and transit needs. [place] Support Programs: @10RELATIVEDAYS @ > Cancer Support Group  2nd Tuesday of the month 1pm-2pm, Journey Room  > Creative Journey  3rd Tuesday of the month 1130am-1pm, Journey Room  > Look Good Feel Better  1st Wednesday of the month 10am-12 noon, Journey Room (Call [place] to register [phone])

## 2017-02-05 NOTE — Progress Notes (Signed)
[name] presents today for injection per MD orders. Xgeva 120 mg administered SQ in right Abdomen. Administration without incident. Patient tolerated well.

## 2017-02-06 ENCOUNTER — Other Ambulatory Visit (HOSPITAL_COMMUNITY): Payer: Self-pay | Admitting: Adult Health

## 2017-02-06 DIAGNOSIS — C61 Malignant neoplasm of prostate: Secondary | ICD-10-CM

## 2017-02-06 DIAGNOSIS — C7951 Secondary malignant neoplasm of bone: Secondary | ICD-10-CM

## 2017-02-09 ENCOUNTER — Ambulatory Visit: Payer: Medicare Other | Admitting: Family Medicine

## 2017-02-11 ENCOUNTER — Ambulatory Visit (INDEPENDENT_AMBULATORY_CARE_PROVIDER_SITE_OTHER): Payer: Medicare Other | Admitting: Urology

## 2017-02-11 DIAGNOSIS — N5201 Erectile dysfunction due to arterial insufficiency: Secondary | ICD-10-CM

## 2017-02-11 DIAGNOSIS — C61 Malignant neoplasm of prostate: Secondary | ICD-10-CM | POA: Diagnosis not present

## 2017-03-04 ENCOUNTER — Ambulatory Visit (HOSPITAL_COMMUNITY): Payer: Medicare Other

## 2017-03-04 ENCOUNTER — Other Ambulatory Visit (HOSPITAL_COMMUNITY): Payer: Medicare Other

## 2017-03-05 ENCOUNTER — Encounter (HOSPITAL_BASED_OUTPATIENT_CLINIC_OR_DEPARTMENT_OTHER): Payer: Medicare Other

## 2017-03-05 ENCOUNTER — Other Ambulatory Visit: Payer: Self-pay

## 2017-03-05 ENCOUNTER — Encounter (HOSPITAL_COMMUNITY): Payer: Medicare Other | Attending: Oncology

## 2017-03-05 ENCOUNTER — Encounter (HOSPITAL_COMMUNITY): Payer: Self-pay

## 2017-03-05 VITALS — BP 126/63 | HR 62 | Temp 98.3°F | Resp 18

## 2017-03-05 DIAGNOSIS — C7951 Secondary malignant neoplasm of bone: Secondary | ICD-10-CM

## 2017-03-05 DIAGNOSIS — C61 Malignant neoplasm of prostate: Secondary | ICD-10-CM | POA: Insufficient documentation

## 2017-03-05 LAB — CBC WITH DIFFERENTIAL/PLATELET
Basophils Absolute: 0 10*3/uL (ref 0.0–0.1)
Basophils Relative: 0 %
EOS PCT: 2 %
Eosinophils Absolute: 0.1 10*3/uL (ref 0.0–0.7)
HEMATOCRIT: 34.4 % — AB (ref 39.0–52.0)
HEMOGLOBIN: 11.5 g/dL — AB (ref 13.0–17.0)
LYMPHS PCT: 40 %
Lymphs Abs: 2.6 10*3/uL (ref 0.7–4.0)
MCH: 29.4 pg (ref 26.0–34.0)
MCHC: 33.4 g/dL (ref 30.0–36.0)
MCV: 88 fL (ref 78.0–100.0)
MONO ABS: 0.4 10*3/uL (ref 0.1–1.0)
Monocytes Relative: 6 %
Neutro Abs: 3.4 10*3/uL (ref 1.7–7.7)
Neutrophils Relative %: 52 %
Platelets: 152 10*3/uL (ref 150–400)
RBC: 3.91 MIL/uL — ABNORMAL LOW (ref 4.22–5.81)
RDW: 14.7 % (ref 11.5–15.5)
WBC: 6.5 10*3/uL (ref 4.0–10.5)

## 2017-03-05 LAB — COMPREHENSIVE METABOLIC PANEL
ALK PHOS: 47 U/L (ref 38–126)
ALT: 23 U/L (ref 17–63)
ANION GAP: 9 (ref 5–15)
AST: 32 U/L (ref 15–41)
Albumin: 4 g/dL (ref 3.5–5.0)
BILIRUBIN TOTAL: 0.8 mg/dL (ref 0.3–1.2)
BUN: 17 mg/dL (ref 6–20)
CALCIUM: 8.9 mg/dL (ref 8.9–10.3)
CO2: 28 mmol/L (ref 22–32)
Chloride: 102 mmol/L (ref 101–111)
Creatinine, Ser: 1.31 mg/dL — ABNORMAL HIGH (ref 0.61–1.24)
GFR calc non Af Amer: 51 mL/min — ABNORMAL LOW (ref >60)
GFR, EST AFRICAN AMERICAN: 59 mL/min — AB (ref >60)
GLUCOSE: 130 mg/dL — AB (ref 65–99)
Potassium: 3.6 mmol/L (ref 3.5–5.1)
Sodium: 139 mmol/L (ref 135–145)
TOTAL PROTEIN: 6.5 g/dL (ref 6.5–8.1)

## 2017-03-05 MED ORDER — TRAMADOL HCL 50 MG PO TABS: 50.0000 mg | ORAL_TABLET | Freq: Four times a day (QID) | ORAL | 0 refills | Status: DC | PRN

## 2017-03-05 MED ORDER — METHYLPHENIDATE HCL 20 MG PO TABS: 20.0000 mg | ORAL_TABLET | Freq: Every day | ORAL | 0 refills | Status: DC

## 2017-03-05 MED ORDER — OMEPRAZOLE 40 MG PO CPDR: 40.0000 mg | DELAYED_RELEASE_CAPSULE | Freq: Every day | ORAL | 1 refills | Status: DC

## 2017-03-05 MED ORDER — TIZANIDINE HCL 4 MG PO CAPS: 4.0000 mg | ORAL_CAPSULE | Freq: Two times a day (BID) | ORAL | 0 refills | Status: DC

## 2017-03-05 MED ORDER — DENOSUMAB 120 MG/1.7ML ~~LOC~~ SOLN
120.0000 mg | Freq: Once | SUBCUTANEOUS | Status: AC
  Administered 2017-03-05: 120 mg via SUBCUTANEOUS
  Filled 2017-03-05: qty 1.7

## 2017-03-05 NOTE — Patient Instructions (Signed)
[facility] Discharge Instructions  RECOMMENDATIONS MADE BY THE CONSULTANT AND ANY TEST RESULTS WILL BE SENT TO YOUR REFERRING PHYSICIAN.  xgeva given today  Follow up as scheduled  Thank you for choosing  [facility] at [facility] to provide your oncology and hematology care.  To afford each patient quality time with our provider, please arrive at least 15 minutes before your scheduled appointment time.    If you have a lab appointment with the Cancer Center please come in thru the  Main Entrance and check in at the main information desk  You need to re-schedule your appointment should you arrive 10 or more minutes late.  We strive to give you quality time with our providers, and arriving late affects you and other patients whose appointments are after yours.  Also, if you no show three or more times for appointments you may be dismissed from the clinic at the providers discretion.     Again, thank you for choosing [facility].  Our hope is that these requests will decrease the amount of time that you wait before being seen by our physicians.       _____________________________________________________________  Should you have questions after your visit to  Cancer Center, please contact our office at [phone] between the hours of 8:30 a.m. and 4:30 p.m.  Voicemails left after 4:30 p.m. will not be returned until the following business day.  For prescription refill requests, have your pharmacy contact our office.       Resources For Cancer Patients and their Caregivers ? American Cancer Society: Can assist with transportation, wigs, general needs, runs Look Good Feel Better.        1-888-227-6333 ? Cancer Care: Provides financial assistance, online support groups, medication/co-pay assistance.  1-800-813-HOPE (4673) ? [facility] Assists [place] cancer patients and their  families through emotional , educational and financial support.  336-427-4357 ? [place] DSS Where to apply for food stamps, Medicaid and utility assistance. 336-342-1394 ? RCATS: Transportation to medical appointments. 336-347-2287 ? Social Security Administration: May apply for disability if have a Stage IV cancer. 336-342-7796 1-800-772-1213 ? [place] [facility], Disability and Transit Services: Assists with nutrition, care and transit needs. 336-349-2343  Cancer Center Support Programs: @10RELATIVEDAYS@ > Cancer Support Group  2nd Tuesday of the month 1pm-2pm, Journey Room  > Creative Journey  3rd Tuesday of the month 1130am-1pm, Journey Room  > Look Good Feel Better  1st Wednesday of the month 10am-12 noon, Journey Room (Call American Cancer Society to register 1-800-395-5775)   

## 2017-03-05 NOTE — Progress Notes (Signed)
[name] presents today for injection per MD orders. xgeva 120mg  administered SQ in right Abdomen. Administration without incident. Patient tolerated well. Vitals stable, discharged home from clinic ambulatory, follow up as scheduled.

## 2017-03-11 ENCOUNTER — Ambulatory Visit (INDEPENDENT_AMBULATORY_CARE_PROVIDER_SITE_OTHER): Payer: Medicare Other | Admitting: Family Medicine

## 2017-03-11 ENCOUNTER — Encounter: Payer: Self-pay | Admitting: Family Medicine

## 2017-03-11 VITALS — BP 130/70 | HR 76 | Temp 97.4°F | Resp 18 | Ht 69.0 in | Wt 273.1 lb

## 2017-03-11 DIAGNOSIS — M545 Low back pain, unspecified: Secondary | ICD-10-CM

## 2017-03-11 DIAGNOSIS — I10 Essential (primary) hypertension: Secondary | ICD-10-CM

## 2017-03-11 DIAGNOSIS — G8929 Other chronic pain: Secondary | ICD-10-CM | POA: Diagnosis not present

## 2017-03-11 DIAGNOSIS — L309 Dermatitis, unspecified: Secondary | ICD-10-CM | POA: Diagnosis not present

## 2017-03-11 DIAGNOSIS — K219 Gastro-esophageal reflux disease without esophagitis: Secondary | ICD-10-CM | POA: Diagnosis not present

## 2017-03-11 MED ORDER — SELENIUM SULFIDE 2.5 % EX LOTN: 1.0000 "application " | TOPICAL_LOTION | Freq: Every day | CUTANEOUS | 12 refills | Status: DC | PRN

## 2017-03-11 MED ORDER — CLOTRIMAZOLE-BETAMETHASONE 1-0.05 % EX CREA: 1.0000 "application " | TOPICAL_CREAM | Freq: Two times a day (BID) | CUTANEOUS | 0 refills | Status: DC

## 2017-03-11 MED ORDER — SELENIUM SULFIDE 2.5 % EX LOTN: TOPICAL_LOTION | Freq: Every day | CUTANEOUS | Status: DC | PRN

## 2017-03-11 NOTE — Progress Notes (Signed)
Chief Complaint  Patient presents with  . Follow-up    3 month   Needs refill of old Rx for selenium lotion and lotrisone prescribed for face rash, likely seborrhea Back pain a little better Trying to walk daily BP is controlled No GERD on medicine  Patient Active Problem List   Diagnosis Date Noted  . Tubular adenoma of colon 12/03/2016  . Morbid obesity ([place]) 10/24/2016  . Essential hypertension 10/24/2016  . HLD (hyperlipidemia) 10/24/2016  . 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 Chronic back pain 10/24/2016  . PUD (peptic ulcer disease) 10/24/2016  . GERD (gastroesophageal reflux disease) 10/24/2016  . Hemorrhoids 10/24/2016  . Sleep apnea 10/24/2016  . Uncontrolled daytime somnolence 10/24/2016  . HOH (hard of hearing) 10/24/2016  . Testicular discomfort 05/07/2016  . 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 benazepril (LOTENSIN) 20 MG tablet Take 1 tablet (20 mg total) by mouth daily.  . Calcium Carb-Cholecalciferol (CALCIUM-VITAMIN D) 500-200 MG-UNIT tablet Take 1 tablet by mouth daily.  . cetirizine (ZYRTEC) 10 MG tablet Take 1 tablet (10 mg total) by mouth daily.  . hydrochlorothiazide (HYDRODIURIL) 25 MG tablet Take 1 tablet (25 mg total) by mouth daily.  . meloxicam (MOBIC) 7.5 MG tablet Take 7.5 mg by mouth daily.  . metFORMIN (GLUCOPHAGE) 500 MG tablet Take 1 tablet (500 mg total) by mouth daily with breakfast. For weight and sugar  . methylphenidate (RITALIN) 20 MG tablet Take 1 tablet (20 mg total) by mouth daily.  Marland Kitchen omega-3 acid ethyl esters (LOVAZA) 1 g capsule Take 2 g by mouth 2 (two) times daily.  Marland Kitchen omeprazole (PRILOSEC) 40 MG capsule Take 1 capsule (40 mg total) by mouth daily.  Marland Kitchen tiZANidine (ZANAFLEX) 4 MG capsule Take 1 capsule (4 mg total) by mouth 2 (two) times daily.  . traMADol (ULTRAM) 50 MG tablet Take 1 tablet (50 mg total) by mouth  every 6 (six) hours as needed. Pain  . [DISCONTINUED] Elastic Bandages & Supports (LUMBAR BACK BRACE/SUPPORT PAD) MISC 1 Units by Other route daily as needed. Use back brace with activity to reduce pain  . clotrimazole-betamethasone (LOTRISONE) cream Apply 1 application topically 2 (two) times daily.  Marland Kitchen selenium sulfide (SELSUN) 2.5 % shampoo Apply 1 application topically daily as needed for irritation.  . [DISCONTINUED] methylPREDNISolone (MEDROL DOSEPAK) 4 MG TBPK tablet Tad (Patient not taking: Reported on 03/11/2017)  . [DISCONTINUED] selenium sulfide (SELSUN) 2.5 % shampoo    No facility-administered encounter medications on file as of 03/11/2017.     No Known Allergies  Review of Systems  Constitutional: Negative for activity change and appetite change.  HENT: Negative for congestion, sinus pain and sinus pressure.   Eyes: Negative for photophobia and visual disturbance.  Respiratory: Negative for cough and shortness of breath.   Cardiovascular: Negative for chest pain, palpitations and leg swelling.  Gastrointestinal: Positive for abdominal distention. Negative for constipation and diarrhea.       Overweight  Genitourinary:       Incontinence  Musculoskeletal: Positive for back pain. Negative for arthralgias.  Neurological: Negative for dizziness and light-headedness.  Psychiatric/Behavioral: Negative for dysphoric mood and sleep disturbance.       Says mood Is good    BP 130/70 (BP Location: Right Arm, Patient Position: Sitting, Cuff Size: Large)   Pulse 76   Temp 97.4 F (36.3 C) (Temporal)   Resp 18  Ht 5\' 9"  (1.753 m)   Wt 273 lb 1.9 oz (123.9 kg)   SpO2 100%   BMI 40.33 kg/m   Physical Exam  Constitutional: He appears well-developed and well-nourished.  HENT:  Head: Normocephalic and atraumatic.  Mouth/Throat: Oropharynx is clear and moist.  Eyes: Conjunctivae are normal. Pupils are equal, round, and reactive to light.  Neck: Normal range of motion. Neck  supple. No thyromegaly present.  Cardiovascular: Normal rate, regular rhythm and normal heart sounds.   Pulmonary/Chest: Effort normal and breath sounds normal. No respiratory distress.  Abdominal: Soft. Bowel sounds are normal.  protuberant  Musculoskeletal: Normal range of motion. He exhibits edema.  Lymphadenopathy:    He has no cervical adenopathy.  Neurological: He is alert.  Gait normal  Skin: Skin is warm. Rash noted.  Psychiatric: He has a normal mood and affect. His behavior is normal.  Poor knowledge base  Nursing note and vitals reviewed.   ASSESSMENT/PLAN:  1. Essential hypertension   2. Gastroesophageal reflux disease, esophagitis presence not specified   3. Chronic midline low back pain without sciatica   4. Dermatitis    Patient Instructions  Walk every day that you are able No change in medicine See me in 3 months Call sooner for problems   [name], MD

## 2017-03-11 NOTE — Patient Instructions (Signed)
Walk every day that you are able No change in medicine See me in 3 months Call sooner for problems

## 2017-03-12 ENCOUNTER — Other Ambulatory Visit: Payer: Self-pay

## 2017-03-12 MED ORDER — METFORMIN HCL 500 MG PO TABS: 500.0000 mg | ORAL_TABLET | Freq: Every day | ORAL | 1 refills | Status: DC

## 2017-03-31 ENCOUNTER — Other Ambulatory Visit: Payer: Self-pay

## 2017-03-31 MED ORDER — METHYLPHENIDATE HCL 20 MG PO TABS: 20.0000 mg | ORAL_TABLET | Freq: Every day | ORAL | 0 refills | Status: DC

## 2017-04-01 ENCOUNTER — Ambulatory Visit (HOSPITAL_COMMUNITY): Payer: Medicare Other | Admitting: Adult Health

## 2017-04-01 ENCOUNTER — Other Ambulatory Visit: Payer: Self-pay

## 2017-04-01 ENCOUNTER — Other Ambulatory Visit (HOSPITAL_COMMUNITY): Payer: Medicare Other

## 2017-04-01 ENCOUNTER — Ambulatory Visit (HOSPITAL_COMMUNITY): Payer: Medicare Other

## 2017-04-01 MED ORDER — TRAMADOL HCL 50 MG PO TABS: 50.0000 mg | ORAL_TABLET | Freq: Four times a day (QID) | ORAL | 0 refills | Status: DC | PRN

## 2017-04-02 ENCOUNTER — Other Ambulatory Visit (HOSPITAL_COMMUNITY): Payer: Medicare Other

## 2017-04-02 ENCOUNTER — Ambulatory Visit (HOSPITAL_COMMUNITY): Payer: Medicare Other

## 2017-04-02 ENCOUNTER — Ambulatory Visit (HOSPITAL_COMMUNITY): Payer: Medicare Other | Admitting: Adult Health

## 2017-04-03 ENCOUNTER — Other Ambulatory Visit: Payer: Self-pay

## 2017-04-03 MED ORDER — TIZANIDINE HCL 4 MG PO CAPS: 4.0000 mg | ORAL_CAPSULE | Freq: Two times a day (BID) | ORAL | 0 refills | Status: DC

## 2017-04-09 ENCOUNTER — Encounter (HOSPITAL_COMMUNITY): Payer: Self-pay | Admitting: Oncology

## 2017-04-09 ENCOUNTER — Encounter (HOSPITAL_COMMUNITY): Payer: Medicare Other | Attending: Oncology

## 2017-04-09 ENCOUNTER — Encounter (HOSPITAL_COMMUNITY): Payer: Medicare Other

## 2017-04-09 ENCOUNTER — Encounter (HOSPITAL_BASED_OUTPATIENT_CLINIC_OR_DEPARTMENT_OTHER): Payer: Medicare Other | Admitting: Oncology

## 2017-04-09 VITALS — BP 113/59 | HR 62 | Temp 98.4°F | Resp 19

## 2017-04-09 DIAGNOSIS — C61 Malignant neoplasm of prostate: Secondary | ICD-10-CM

## 2017-04-09 DIAGNOSIS — G4730 Sleep apnea, unspecified: Secondary | ICD-10-CM | POA: Diagnosis not present

## 2017-04-09 DIAGNOSIS — C7951 Secondary malignant neoplasm of bone: Secondary | ICD-10-CM

## 2017-04-09 DIAGNOSIS — Z87891 Personal history of nicotine dependence: Secondary | ICD-10-CM | POA: Insufficient documentation

## 2017-04-09 DIAGNOSIS — E785 Hyperlipidemia, unspecified: Secondary | ICD-10-CM | POA: Diagnosis not present

## 2017-04-09 DIAGNOSIS — I10 Essential (primary) hypertension: Secondary | ICD-10-CM | POA: Diagnosis not present

## 2017-04-09 DIAGNOSIS — M545 Low back pain: Secondary | ICD-10-CM | POA: Diagnosis not present

## 2017-04-09 DIAGNOSIS — K219 Gastro-esophageal reflux disease without esophagitis: Secondary | ICD-10-CM | POA: Insufficient documentation

## 2017-04-09 DIAGNOSIS — G8929 Other chronic pain: Secondary | ICD-10-CM | POA: Insufficient documentation

## 2017-04-09 LAB — COMPREHENSIVE METABOLIC PANEL
ALT: 23 U/L (ref 17–63)
AST: 34 U/L (ref 15–41)
Albumin: 4 g/dL (ref 3.5–5.0)
Alkaline Phosphatase: 48 U/L (ref 38–126)
Anion gap: 9 (ref 5–15)
BILIRUBIN TOTAL: 0.7 mg/dL (ref 0.3–1.2)
BUN: 14 mg/dL (ref 6–20)
CO2: 25 mmol/L (ref 22–32)
CREATININE: 1.12 mg/dL (ref 0.61–1.24)
Calcium: 9.3 mg/dL (ref 8.9–10.3)
Chloride: 103 mmol/L (ref 101–111)
Glucose, Bld: 122 mg/dL — ABNORMAL HIGH (ref 65–99)
POTASSIUM: 3.6 mmol/L (ref 3.5–5.1)
Sodium: 137 mmol/L (ref 135–145)
TOTAL PROTEIN: 6.6 g/dL (ref 6.5–8.1)

## 2017-04-09 LAB — CBC WITH DIFFERENTIAL/PLATELET
BASOS ABS: 0 10*3/uL (ref 0.0–0.1)
BASOS PCT: 0 %
EOS ABS: 0.1 10*3/uL (ref 0.0–0.7)
EOS PCT: 2 %
HCT: 35 % — ABNORMAL LOW (ref 39.0–52.0)
Hemoglobin: 11.9 g/dL — ABNORMAL LOW (ref 13.0–17.0)
Lymphocytes Relative: 36 %
Lymphs Abs: 1.9 10*3/uL (ref 0.7–4.0)
MCH: 29.2 pg (ref 26.0–34.0)
MCHC: 34 g/dL (ref 30.0–36.0)
MCV: 86 fL (ref 78.0–100.0)
MONO ABS: 0.2 10*3/uL (ref 0.1–1.0)
Monocytes Relative: 4 %
Neutro Abs: 2.9 10*3/uL (ref 1.7–7.7)
Neutrophils Relative %: 58 %
PLATELETS: 152 10*3/uL (ref 150–400)
RBC: 4.07 MIL/uL — ABNORMAL LOW (ref 4.22–5.81)
RDW: 14 % (ref 11.5–15.5)
WBC: 5.1 10*3/uL (ref 4.0–10.5)

## 2017-04-09 LAB — PSA: PROSTATIC SPECIFIC ANTIGEN: 0.19 ng/mL (ref 0.00–4.00)

## 2017-04-09 MED ORDER — DENOSUMAB 120 MG/1.7ML ~~LOC~~ SOLN
120.0000 mg | Freq: Once | SUBCUTANEOUS | Status: AC
  Administered 2017-04-09: 120 mg via SUBCUTANEOUS
  Filled 2017-04-09: qty 1.7

## 2017-04-09 MED ORDER — OMEPRAZOLE 40 MG PO CPDR: 40.0000 mg | DELAYED_RELEASE_CAPSULE | Freq: Every day | ORAL | 3 refills | Status: DC

## 2017-04-09 NOTE — Progress Notes (Signed)
[name] Everts, MD [phone] [place] [address]  Prostate cancer metastatic to bone [facility]) - Plan: CBC with Differential, CBC with Differential, Comprehensive metabolic panel, PSA  Gastroesophageal reflux disease, esophagitis presence not specified - Plan: omeprazole (PRILOSEC) 40 MG capsule  CURRENT THERAPY: Depo-Lupron every 3 months by urology ([facility]) and [facility] See monthly.  INTERVAL HISTORY: [name] 76 y.o. male returns for followup of Stage IV prostate cancer, adenocarcinoma, with bone metastases AND GERD    HPI Elements   Location: Prostate  Quality: Adenocarcinoma  Severity: Stage IV  Duration: Dx in 2010  Context:   Timing:   Modifying Factors: S/P robotic radical prostatectomy at [facility]; followed by XRT to tumor bed for rising PSA.  Associated Signs & Symptoms:    He is doing well from an oncology perspective.  He denies any new pain.  Weight is stable.  Appetite is stable.  He does continue to follow with [facility] as planned for his Depo-Lupron injections.  He is provided some education regarding ADT therapy.  He continues with monthly Xgeva for bone targeted therapy.  He is provided education regarding this.  Review of Systems  Constitutional: Negative.  Negative for chills, fever and weight loss.  HENT: Negative.   Eyes: Negative.   Respiratory: Negative.  Negative for cough.   Cardiovascular: Negative.  Negative for chest pain.  Gastrointestinal: Negative.  Negative for blood in stool, constipation, diarrhea, melena, nausea and vomiting.  Genitourinary: Negative.   Musculoskeletal: Negative.   Skin: Negative.   Neurological: Negative.  Negative for weakness.  Endo/Heme/Allergies: Negative.   Psychiatric/Behavioral: Negative.     Past Medical History:  Diagnosis Date  . Cancer [facility])    prostate  . GERD (gastroesophageal reflux disease)   . Hyperlipidemia   . Hypertension   . Prostate cancer  metastatic to bone ([place]) 04/04/2016  . Sleep apnea 10/24/2016    Past Surgical History:  Procedure Laterality Date  . CHOLECYSTECTOMY    . HAND SURGERY    . INGUINAL HERNIA REPAIR Left   . PROSTATECTOMY    . PROSTATECTOMY    . radical prostatectomy    . TONSILLECTOMY      Family History  Problem Relation Age of Onset  . Heart failure Mother        CHF  . Prostate cancer Father   . Narcolepsy Sister   . Cancer Daughter     Social History   Social History  . Marital status: Widowed    Spouse name: N/A  . Number of children: 3  . Years of education: 9   Occupational History  . handyman- retired     [employer]   Social History Main Topics  . Smoking status: Former Smoker    Packs/day: 1.00    Years: 32.00    Types: Cigarettes    Quit date: 02/04/1989  . Smokeless tobacco: Never Used  . Alcohol use No  . Drug use: No  . Sexual activity: Not Currently   Other Topics Concern  . Not on file   Social History Narrative   Widowed   Loves with son   Chronic low back pain     PHYSICAL EXAMINATION  ECOG PERFORMANCE STATUS: 1 - Symptomatic but completely ambulatory  There were no vitals filed for this visit.  Vitals - 1 value per visit 04/02/3709  SYSTOLIC 626  DIASTOLIC 59  Pulse 62  Temperature 98.4  Respirations  19   Weight is 271.5 lbs  GENERAL:alert, no distress, well nourished, well developed, comfortable, cooperative, obese, smiling and unaccompanied SKIN: skin color, texture, turgor are normal, no rashes or significant lesions HEAD: Normocephalic, No masses, lesions, tenderness or abnormalities EYES: normal, EOMI, Conjunctiva are pink and non-injected EARS: External ears normal OROPHARYNX:lips, buccal mucosa, and tongue normal and mucous membranes are moist  NECK: supple, no adenopathy, thyroid normal size, non-tender, without nodularity, trachea midline LYMPH:  no palpable lymphadenopathy BREAST:not examined LUNGS: clear to auscultation and  percussion HEART: regular rate & rhythm, no murmurs, no gallops, S1 normal and S2 normal ABDOMEN:abdomen soft, non-tender, obese and normal bowel sounds BACK: Back symmetric, no curvature. EXTREMITIES:less then 2 second capillary refill, no joint deformities, effusion, or inflammation, no skin discoloration, no cyanosis  NEURO: alert & oriented x 3 with fluent speech, no focal motor/sensory deficits, gait normal   LABORATORY DATA: CBC    Component Value Date/Time   WBC 5.1 04/09/2017 0908   RBC 4.07 (L) 04/09/2017 0908   HGB 11.9 (L) 04/09/2017 0908   HCT 35.0 (L) 04/09/2017 0908   PLT 152 04/09/2017 0908   MCV 86.0 04/09/2017 0908   MCH 29.2 04/09/2017 0908   MCHC 34.0 04/09/2017 0908   RDW 14.0 04/09/2017 0908   LYMPHSABS 1.9 04/09/2017 0908   MONOABS 0.2 04/09/2017 0908   EOSABS 0.1 04/09/2017 0908   BASOSABS 0.0 04/09/2017 0908      Chemistry      Component Value Date/Time   NA 137 04/09/2017 0908   K 3.6 04/09/2017 0908   CL 103 04/09/2017 0908   CO2 25 04/09/2017 0908   BUN 14 04/09/2017 0908   CREATININE 1.12 04/09/2017 0908      Component Value Date/Time   CALCIUM 9.3 04/09/2017 0908   ALKPHOS 48 04/09/2017 0908   AST 34 04/09/2017 0908   ALT 23 04/09/2017 0908   BILITOT 0.7 04/09/2017 0908     Lab Results  Component Value Date   PSA 0.16 02/05/2017   PSA 0.15 01/07/2017   PSA 0.13 12/10/2016     PENDING LABS:   RADIOGRAPHIC STUDIES:  No results found.   PATHOLOGY:    ASSESSMENT AND PLAN:  Prostate cancer metastatic to bone ([place]) Stage IV prostate cancer, adenocarcinoma, with bone metastases, S/P robotic radical prostatectomy at [facility], but with a rising post-operative PSA leading to XRT to tumor bed.  Biochemical recurrence noted with clinical findings suspicious for metastatic disease on PET/CT with abdominal lymphadenopathy and perihepatic nodule measuring 2.2 cm adjacent to the dome of the liver.  He was started on Depo-Lupron [place]  with biochemical response.  Labs today: CBC diff, CMET, PSA.  I personally reviewed and went over laboratory results with the patient.  The results are noted within this dictation.  Labs satisfy treatment with Delton See today.  Nursing, in accordance with bone-targeted therapy administration protocol, will monitor for acute side effects/toxicities.  Labs every 4 weeks: CMET.  Labs in 3 months: CBC diff, CMET, PSA.  Continue urologic follow-up as directed with ongoing Depo-Lupron (ADT) management.    At his request, I have refilled his Prilosec Rx.  Return in 3 months for ongoing follow-up.     ORDERS PLACED FOR THIS ENCOUNTER: Orders Placed This Encounter  Procedures  . CBC with Differential  . CBC with Differential  . Comprehensive metabolic panel  . PSA    MEDICATIONS PRESCRIBED THIS ENCOUNTER: Meds ordered this encounter  Medications  . omeprazole (PRILOSEC) 40 MG  capsule    Sig: Take 1 capsule (40 mg total) by mouth daily.    Dispense:  30 capsule    Refill:  3    Order Specific Question:   Supervising Provider    Answer:   Brunetta Genera [[id number]]    THERAPY PLAN:  Continue with Depo-Lupron every 3 months by [facility] and Delton See every 4 weeks.  All questions were answered. The patient knows to call the clinic with any problems, questions or concerns. We can certainly see the patient much sooner if necessary.  Patient and plan discussed with Dr. Twana First and she is in agreement with the aforementioned.   This note is electronically signed by: Doy Mince 04/09/2017 10:26 AM

## 2017-04-09 NOTE — Progress Notes (Signed)
[name] tolerated Xgeva injection well without complaints or incident. Calcium 9.3. Pt denied any tooth,jaw or leg pain and reports he is taking Calcium as prescribed prior to administering this injection. VSS Pt discharged self ambulatory in satisfactory condition

## 2017-04-09 NOTE — Patient Instructions (Signed)
[place] at [facility] Discharge Instructions  RECOMMENDATIONS MADE BY THE CONSULTANT AND ANY TEST RESULTS WILL BE SENT TO YOUR REFERRING PHYSICIAN.  You were seen today by Kirby Crigler PA-C. Return every 4 weeks for labs and Xgeva. Return in 3 months for labs and follow up.   Thank you for choosing [place] at [facility] to provide your oncology and hematology care.  To afford each patient quality time with our provider, please arrive at least 15 minutes before your scheduled appointment time.    If you have a lab appointment with the [facility] please come in thru the  Main Entrance and check in at the main information desk  You need to re-schedule your appointment should you arrive 10 or more minutes late.  We strive to give you quality time with our providers, and arriving late affects you and other patients whose appointments are after yours.  Also, if you no show three or more times for appointments you may be dismissed from the clinic at the providers discretion.     Again, thank you for choosing [facility].  Our hope is that these requests will decrease the amount of time that you wait before being seen by our physicians.       _____________________________________________________________  Should you have questions after your visit to [facility], please contact our office at [phone] between the hours of 8:30 a.m. and 4:30 p.m.  Voicemails left after 4:30 p.m. will not be returned until the following business day.  For prescription refill requests, have your pharmacy contact our office.       Resources For Cancer Patients and their Caregivers ? American Cancer Society: Can assist with transportation, wigs, general needs, runs Look Good Feel Better.        651-449-9403 ? Cancer Care: Provides financial assistance, online support groups, medication/co-pay assistance.  1-800-813-HOPE  (812)511-9065) ? [place] Assists [place] cancer patients and their families through emotional , educational and financial support.  636-146-5752 ? [place] DSS Where to apply for food stamps, Medicaid and utility assistance. 757-316-0654 ? RCATS: Transportation to medical appointments. (843) 153-9146 ? Social Security Administration: May apply for disability if have a Stage IV cancer. (769)552-7292 956-225-6649 ? [employer], Disability and Transit Services: Assists with nutrition, care and transit needs. [place] Support Programs: @10RELATIVEDAYS @ > Cancer Support Group  2nd Tuesday of the month 1pm-2pm, Journey Room  > Creative Journey  3rd Tuesday of the month 1130am-1pm, Journey Room  > Look Good Feel Better  1st Wednesday of the month 10am-12 noon, Journey Room (Call [place] to register [phone])

## 2017-04-09 NOTE — Addendum Note (Signed)
Addended by: Farley Ly on: 04/09/2017 11:45 AM   Modules accepted: Orders

## 2017-04-09 NOTE — Patient Instructions (Signed)
[facility] at [facility] Discharge Instructions  RECOMMENDATIONS MADE BY THE CONSULTANT AND ANY TEST RESULTS WILL BE SENT TO YOUR REFERRING PHYSICIAN.  Received Xgeva injection today. Follow-up as scheduled. Call clinic for any questions or concerns  Thank you for choosing [facility] at [facility] to provide your oncology and hematology care.  To afford each patient quality time with our provider, please arrive at least 15 minutes before your scheduled appointment time.    If you have a lab appointment with the Cancer Center please come in thru the  Main Entrance and check in at the main information desk  You need to re-schedule your appointment should you arrive 10 or more minutes late.  We strive to give you quality time with our providers, and arriving late affects you and other patients whose appointments are after yours.  Also, if you no show three or more times for appointments you may be dismissed from the clinic at the providers discretion.     Again, thank you for choosing [facility].  Our hope is that these requests will decrease the amount of time that you wait before being seen by our physicians.       _____________________________________________________________  Should you have questions after your visit to [facility], please contact our office at [phone] between the hours of 8:30 a.m. and 4:30 p.m.  Voicemails left after 4:30 p.m. will not be returned until the following business day.  For prescription refill requests, have your pharmacy contact our office.       Resources For Cancer Patients and their Caregivers ? American Cancer Society: Can assist with transportation, wigs, general needs, runs Look Good Feel Better.        1-888-227-6333 ? Cancer Care: Provides financial assistance, online support groups, medication/co-pay assistance.  1-800-813-HOPE (4673) ? [facility] Assists [place] cancer patients and their families through emotional , educational and financial support.  336-427-4357 ? [place] DSS Where to apply for food stamps, Medicaid and utility assistance. 336-342-1394 ? RCATS: Transportation to medical appointments. 336-347-2287 ? Social Security Administration: May apply for disability if have a Stage IV cancer. 336-342-7796 1-800-772-1213 ? [place] [facility], Disability and Transit Services: Assists with nutrition, care and transit needs. 336-349-2343  Cancer Center Support Programs: @10RELATIVEDAYS@ > Cancer Support Group  2nd Tuesday of the month 1pm-2pm, Journey Room  > Creative Journey  3rd Tuesday of the month 1130am-1pm, Journey Room  > Look Good Feel Better  1st Wednesday of the month 10am-12 noon, Journey Room (Call American Cancer Society to register 1-800-395-5775)   

## 2017-04-09 NOTE — Assessment & Plan Note (Addendum)
Stage IV prostate cancer, adenocarcinoma, with bone metastases, S/P robotic radical prostatectomy at [facility], but with a rising post-operative PSA leading to XRT to tumor bed.  Biochemical recurrence noted with clinical findings suspicious for metastatic disease on PET/CT with abdominal lymphadenopathy and perihepatic nodule measuring 2.2 cm adjacent to the dome of the liver.  He was started on Depo-Lupron [place] with biochemical response.  Labs today: CBC diff, CMET, PSA.  I personally reviewed and went over laboratory results with the patient.  The results are noted within this dictation.  Labs satisfy treatment with Delton See today.  Nursing, in accordance with bone-targeted therapy administration protocol, will monitor for acute side effects/toxicities.  Labs every 4 weeks: CMET.  Labs in 3 months: CBC diff, CMET, PSA.  Continue urologic follow-up as directed with ongoing Depo-Lupron (ADT) management.    At his request, I have refilled his Prilosec Rx.  Return in 3 months for ongoing follow-up.

## 2017-04-27 ENCOUNTER — Telehealth: Payer: Self-pay | Admitting: *Deleted

## 2017-04-27 NOTE — Telephone Encounter (Signed)
Kim NP with [facility] called left message on nurse line stating she was in the patient's home today and patient's vitals were normal but the patient did have a irregular heart beat, kim did not notice that on his chart, she wanted him to have a appointment. I scheduled patient for 7/12 at 9:40. Patient is aware. If you need to call Maudie Mercury her number is [phone]

## 2017-05-04 ENCOUNTER — Other Ambulatory Visit: Payer: Self-pay

## 2017-05-04 MED ORDER — TIZANIDINE HCL 4 MG PO CAPS: 4.0000 mg | ORAL_CAPSULE | Freq: Two times a day (BID) | ORAL | 0 refills | Status: DC

## 2017-05-04 NOTE — Telephone Encounter (Signed)
Seen [phone] 

## 2017-05-06 ENCOUNTER — Other Ambulatory Visit (HOSPITAL_COMMUNITY): Payer: Self-pay | Admitting: *Deleted

## 2017-05-06 DIAGNOSIS — C61 Malignant neoplasm of prostate: Secondary | ICD-10-CM

## 2017-05-06 DIAGNOSIS — C7951 Secondary malignant neoplasm of bone: Secondary | ICD-10-CM

## 2017-05-07 ENCOUNTER — Other Ambulatory Visit (HOSPITAL_COMMUNITY): Payer: Medicare Other

## 2017-05-07 ENCOUNTER — Ambulatory Visit (INDEPENDENT_AMBULATORY_CARE_PROVIDER_SITE_OTHER): Payer: Medicare Other | Admitting: Family Medicine

## 2017-05-07 ENCOUNTER — Ambulatory Visit (HOSPITAL_COMMUNITY): Payer: Medicare Other

## 2017-05-07 ENCOUNTER — Encounter: Payer: Self-pay | Admitting: Family Medicine

## 2017-05-07 ENCOUNTER — Telehealth: Payer: Self-pay | Admitting: Family Medicine

## 2017-05-07 VITALS — BP 120/64 | HR 68 | Temp 97.7°F | Resp 18 | Ht 69.0 in | Wt 271.0 lb

## 2017-05-07 DIAGNOSIS — I499 Cardiac arrhythmia, unspecified: Secondary | ICD-10-CM

## 2017-05-07 DIAGNOSIS — I493 Ventricular premature depolarization: Secondary | ICD-10-CM

## 2017-05-07 MED ORDER — CALCIUM-VITAMIN D 500-200 MG-UNIT PO TABS: 1.0000 | ORAL_TABLET | Freq: Every day | ORAL | 3 refills | Status: DC

## 2017-05-07 MED ORDER — TRAMADOL HCL 50 MG PO TABS: 50.0000 mg | ORAL_TABLET | Freq: Four times a day (QID) | ORAL | 0 refills | Status: DC | PRN

## 2017-05-07 NOTE — Telephone Encounter (Signed)
Follow up  Pt voiced :  traMADol (ULTRAM) 50 MG tablet [[id number]] is suppose to be sent to [facility].

## 2017-05-07 NOTE — Telephone Encounter (Signed)
Called pt, aware d nelson will not write this until after he sees cardiology.

## 2017-05-07 NOTE — Progress Notes (Signed)
Chief Complaint  Patient presents with  . Irregular Heart Beat   Home health nurse noted irreg heart eat and called the office.  [name] is unaware of rapid or missed beats.  No chest pain or pressure.  No dyspnea or leg swelling.  States has not had heart problems before. He also requests a refill of his ritalin.  Says he needs it to "stay awake".  I remain uncomfortable prescribing this to him without a clear diagnosis.  Especially with irreg heat.  Today I refuse to fill.   Patient Active Problem List   Diagnosis Date Noted  . Tubular adenoma of colon 12/03/2016  . Morbid obesity ([place]) 10/24/2016  . Essential hypertension 10/24/2016  . HLD (hyperlipidemia) 10/24/2016  . 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 Chronic back pain 10/24/2016  . PUD (peptic ulcer disease) 10/24/2016  . GERD (gastroesophageal reflux disease) 10/24/2016  . Hemorrhoids 10/24/2016  . Sleep apnea 10/24/2016  . Uncontrolled daytime somnolence 10/24/2016  . HOH (hard of hearing) 10/24/2016  . Testicular discomfort 05/07/2016  . Prostate cancer metastatic to bone ([place]) 04/04/2016    Outpatient Encounter Prescriptions as of 05/07/2017  Medication Sig  . benazepril (LOTENSIN) 20 MG tablet Take 1 tablet (20 mg total) by mouth daily.  . Calcium Carb-Cholecalciferol (CALCIUM-VITAMIN D) 500-200 MG-UNIT tablet Take 1 tablet by mouth daily.  . cetirizine (ZYRTEC) 10 MG tablet Take 1 tablet (10 mg total) by mouth daily.  . hydrochlorothiazide (HYDRODIURIL) 25 MG tablet Take 1 tablet (25 mg total) by mouth daily.  . meloxicam (MOBIC) 7.5 MG tablet Take 7.5 mg by mouth daily.  . metFORMIN (GLUCOPHAGE) 500 MG tablet Take 1 tablet (500 mg total) by mouth daily with breakfast. For weight and sugar  . methylphenidate (RITALIN) 20 MG tablet Take 1 tablet (20 mg total) by mouth daily.  Marland Kitchen omega-3 acid ethyl esters (LOVAZA) 1 g capsule Take 2 g by mouth 2 (two) times daily.  Marland Kitchen omeprazole (PRILOSEC) 40  MG capsule Take 1 capsule (40 mg total) by mouth daily.  Marland Kitchen selenium sulfide (SELSUN) 2.5 % shampoo Apply 1 application topically daily as needed for irritation.  Marland Kitchen tiZANidine (ZANAFLEX) 4 MG capsule Take 1 capsule (4 mg total) by mouth 2 (two) times daily.  . traMADol (ULTRAM) 50 MG tablet Take 1 tablet (50 mg total) by mouth every 6 (six) hours as needed. Pain   No facility-administered encounter medications on file as of 05/07/2017.     No Known Allergies  Review of Systems  Constitutional: Positive for fatigue. Negative for activity change and appetite change.       Daytime fatigue  HENT: Negative for congestion, sinus pain and sinus pressure.   Eyes: Negative for photophobia and visual disturbance.  Respiratory: Negative for cough and shortness of breath.   Cardiovascular: Negative for chest pain, palpitations and leg swelling.  Gastrointestinal: Positive for abdominal distention. Negative for constipation and diarrhea.       Overweight  Genitourinary:       Incontinence  Musculoskeletal: Positive for back pain. Negative for arthralgias.  Neurological: Negative for dizziness and light-headedness.  Psychiatric/Behavioral: Negative for dysphoric mood and sleep disturbance.       Says mood Is good    BP 120/64 (BP Location: Right Arm, Patient Position: Sitting, Cuff Size: Large)   Pulse 68   Temp 97.7 F (36.5 C) (Temporal)   Resp 18   Ht 5\' 9"  (1.753 m)   Wt 271 lb (  122.9 kg)   SpO2 98%   BMI 40.02 kg/m   Physical Exam  Constitutional: He appears well-developed and well-nourished.  HENT:  Head: Normocephalic and atraumatic.  Mouth/Throat: Oropharynx is clear and moist.  Eyes: Pupils are equal, round, and reactive to light. Conjunctivae are normal.  Neck: Normal range of motion. Neck supple. No thyromegaly present.  Cardiovascular: Normal rate, regular rhythm and normal heart sounds.   occas. ectopy.  4 in 60 sec auscultation/ EKG with PVC  Pulmonary/Chest: Effort  normal and breath sounds normal. No respiratory distress.  Abdominal: Soft. Bowel sounds are normal.  protuberant  Musculoskeletal: Normal range of motion. He exhibits edema.  Lymphadenopathy:    He has no cervical adenopathy.  Neurological: He is alert.  Gait normal  Skin: Skin is warm. Rash noted.  Psychiatric: He has a normal mood and affect. His behavior is normal.  Poor knowledge base  Nursing note and vitals reviewed.   ASSESSMENT/PLAN:  1. Irregular heartbeat  - EKG 12-Lead - Ambulatory referral to Cardiology  2. Asymptomatic PVCs  - Ambulatory referral to Cardiology   Patient Instructions  Need to see heart specialist   [name], MD

## 2017-05-07 NOTE — Patient Instructions (Signed)
Need to see heart specialist

## 2017-05-07 NOTE — Telephone Encounter (Signed)
New Message  Pt voiced his ritalin is also suppose to be sent to pharmacist.  Please send

## 2017-05-08 ENCOUNTER — Encounter (HOSPITAL_COMMUNITY): Payer: Medicare Other | Attending: Oncology

## 2017-05-08 ENCOUNTER — Other Ambulatory Visit: Payer: Self-pay | Admitting: Family Medicine

## 2017-05-08 ENCOUNTER — Encounter (HOSPITAL_COMMUNITY): Payer: Medicare Other

## 2017-05-08 DIAGNOSIS — C7951 Secondary malignant neoplasm of bone: Secondary | ICD-10-CM

## 2017-05-08 DIAGNOSIS — C61 Malignant neoplasm of prostate: Secondary | ICD-10-CM | POA: Insufficient documentation

## 2017-05-08 LAB — CBC WITH DIFFERENTIAL/PLATELET
BASOS PCT: 0 %
Basophils Absolute: 0 10*3/uL (ref 0.0–0.1)
EOS ABS: 0.1 10*3/uL (ref 0.0–0.7)
Eosinophils Relative: 2 %
HEMATOCRIT: 36.5 % — AB (ref 39.0–52.0)
HEMOGLOBIN: 12.5 g/dL — AB (ref 13.0–17.0)
LYMPHS ABS: 2 10*3/uL (ref 0.7–4.0)
Lymphocytes Relative: 38 %
MCH: 29.1 pg (ref 26.0–34.0)
MCHC: 34.2 g/dL (ref 30.0–36.0)
MCV: 85.1 fL (ref 78.0–100.0)
MONOS PCT: 6 %
Monocytes Absolute: 0.3 10*3/uL (ref 0.1–1.0)
NEUTROS ABS: 2.9 10*3/uL (ref 1.7–7.7)
Neutrophils Relative %: 54 %
Platelets: 155 10*3/uL (ref 150–400)
RBC: 4.29 MIL/uL (ref 4.22–5.81)
RDW: 13.9 % (ref 11.5–15.5)
WBC: 5.3 10*3/uL (ref 4.0–10.5)

## 2017-05-08 LAB — COMPREHENSIVE METABOLIC PANEL
ALBUMIN: 4.1 g/dL (ref 3.5–5.0)
ALK PHOS: 48 U/L (ref 38–126)
ALT: 19 U/L (ref 17–63)
ANION GAP: 10 (ref 5–15)
AST: 32 U/L (ref 15–41)
BILIRUBIN TOTAL: 0.7 mg/dL (ref 0.3–1.2)
BUN: 9 mg/dL (ref 6–20)
CALCIUM: 8.8 mg/dL — AB (ref 8.9–10.3)
CO2: 24 mmol/L (ref 22–32)
Chloride: 103 mmol/L (ref 101–111)
Creatinine, Ser: 1.02 mg/dL (ref 0.61–1.24)
GLUCOSE: 86 mg/dL (ref 65–99)
Potassium: 3.7 mmol/L (ref 3.5–5.1)
Sodium: 137 mmol/L (ref 135–145)
TOTAL PROTEIN: 6.8 g/dL (ref 6.5–8.1)

## 2017-05-08 LAB — PSA: Prostatic Specific Antigen: 0.31 ng/mL (ref 0.00–4.00)

## 2017-05-08 MED ORDER — TRAMADOL HCL 50 MG PO TABS: 50.0000 mg | ORAL_TABLET | Freq: Four times a day (QID) | ORAL | 0 refills | Status: DC | PRN

## 2017-05-08 NOTE — Progress Notes (Signed)
Xgeva held today d/t calcium 8.8.  Reports has been taking calcium + D supplement daily. Will return in 2 weeks for repeat lab work and possible Xgeva injection.

## 2017-05-08 NOTE — Telephone Encounter (Signed)
Done

## 2017-05-11 ENCOUNTER — Other Ambulatory Visit: Payer: Self-pay

## 2017-05-11 DIAGNOSIS — C61 Malignant neoplasm of prostate: Secondary | ICD-10-CM | POA: Diagnosis not present

## 2017-05-13 ENCOUNTER — Ambulatory Visit (HOSPITAL_COMMUNITY): Payer: Medicare Other

## 2017-05-13 ENCOUNTER — Other Ambulatory Visit (HOSPITAL_COMMUNITY): Payer: Medicare Other

## 2017-05-13 ENCOUNTER — Ambulatory Visit (INDEPENDENT_AMBULATORY_CARE_PROVIDER_SITE_OTHER): Payer: Medicare Other | Admitting: Urology

## 2017-05-13 DIAGNOSIS — N5201 Erectile dysfunction due to arterial insufficiency: Secondary | ICD-10-CM

## 2017-05-13 DIAGNOSIS — C61 Malignant neoplasm of prostate: Secondary | ICD-10-CM

## 2017-05-15 ENCOUNTER — Telehealth: Payer: Self-pay | Admitting: Family Medicine

## 2017-05-15 NOTE — Telephone Encounter (Signed)
No back brace.  Period.  They will not fit.  They will not help him.

## 2017-05-15 NOTE — Telephone Encounter (Signed)
Rip Harbour from [facility] called regarding patient. She is a Patient Navigator for the patient, she was calling to get appt info for patient, as well as to inquire about a back brace for patient. She states she was told by hime that when he was here last he commented about needing one to Dr Meda Coffee and states they got sidetracked. She asked that I make a note in the chart that he was interested in getting one so that Dr Meda Coffee could discuss options with him at his next visit.

## 2017-05-20 ENCOUNTER — Other Ambulatory Visit: Payer: Self-pay | Admitting: Family Medicine

## 2017-05-21 ENCOUNTER — Telehealth: Payer: Self-pay

## 2017-05-21 MED ORDER — MELOXICAM 7.5 MG PO TABS: 7.5000 mg | ORAL_TABLET | Freq: Every day | ORAL | 0 refills | Status: DC

## 2017-05-21 NOTE — Telephone Encounter (Signed)
rx sent to pharmacy, [facility] aware.

## 2017-05-21 NOTE — Telephone Encounter (Signed)
Pt came into the office today requesting rx for meloxicam (Mobic) to be called in for him.  Please call in med and call pt to let him know it is done.  Thanks

## 2017-05-22 ENCOUNTER — Other Ambulatory Visit (HOSPITAL_COMMUNITY): Payer: Medicare Other

## 2017-05-22 ENCOUNTER — Encounter (HOSPITAL_COMMUNITY): Payer: Medicare Other

## 2017-05-28 DIAGNOSIS — I10 Essential (primary) hypertension: Secondary | ICD-10-CM | POA: Diagnosis not present

## 2017-05-28 DIAGNOSIS — I700 Atherosclerosis of aorta: Secondary | ICD-10-CM | POA: Diagnosis not present

## 2017-05-28 DIAGNOSIS — Z87891 Personal history of nicotine dependence: Secondary | ICD-10-CM | POA: Diagnosis not present

## 2017-05-28 DIAGNOSIS — I4510 Unspecified right bundle-branch block: Secondary | ICD-10-CM | POA: Diagnosis not present

## 2017-05-28 DIAGNOSIS — M79602 Pain in left arm: Secondary | ICD-10-CM | POA: Diagnosis not present

## 2017-05-28 DIAGNOSIS — S22000A Wedge compression fracture of unspecified thoracic vertebra, initial encounter for closed fracture: Secondary | ICD-10-CM | POA: Diagnosis not present

## 2017-05-28 DIAGNOSIS — M79605 Pain in left leg: Secondary | ICD-10-CM | POA: Diagnosis not present

## 2017-05-28 DIAGNOSIS — M79662 Pain in left lower leg: Secondary | ICD-10-CM | POA: Diagnosis not present

## 2017-06-01 ENCOUNTER — Encounter: Payer: Self-pay | Admitting: Cardiovascular Disease

## 2017-06-01 ENCOUNTER — Ambulatory Visit: Payer: Medicare Other | Admitting: Cardiovascular Disease

## 2017-06-02 ENCOUNTER — Encounter (HOSPITAL_BASED_OUTPATIENT_CLINIC_OR_DEPARTMENT_OTHER): Payer: Medicare Other

## 2017-06-02 ENCOUNTER — Encounter (HOSPITAL_COMMUNITY): Payer: Self-pay

## 2017-06-02 ENCOUNTER — Encounter (HOSPITAL_COMMUNITY): Payer: Medicare Other | Attending: Oncology

## 2017-06-02 VITALS — BP 132/66 | HR 59 | Temp 98.2°F | Resp 18

## 2017-06-02 DIAGNOSIS — C61 Malignant neoplasm of prostate: Secondary | ICD-10-CM | POA: Insufficient documentation

## 2017-06-02 DIAGNOSIS — C7951 Secondary malignant neoplasm of bone: Secondary | ICD-10-CM

## 2017-06-02 LAB — COMPREHENSIVE METABOLIC PANEL
ALT: 19 U/L (ref 17–63)
AST: 29 U/L (ref 15–41)
Albumin: 4 g/dL (ref 3.5–5.0)
Alkaline Phosphatase: 51 U/L (ref 38–126)
Anion gap: 8 (ref 5–15)
BUN: 15 mg/dL (ref 6–20)
CHLORIDE: 102 mmol/L (ref 101–111)
CO2: 28 mmol/L (ref 22–32)
CREATININE: 1.12 mg/dL (ref 0.61–1.24)
Calcium: 9.2 mg/dL (ref 8.9–10.3)
GFR calc non Af Amer: >60 mL/min (ref >60)
Glucose, Bld: 105 mg/dL — ABNORMAL HIGH (ref 65–99)
Potassium: 3.8 mmol/L (ref 3.5–5.1)
SODIUM: 138 mmol/L (ref 135–145)
Total Bilirubin: 0.6 mg/dL (ref 0.3–1.2)
Total Protein: 6.8 g/dL (ref 6.5–8.1)

## 2017-06-02 MED ORDER — DENOSUMAB 120 MG/1.7ML ~~LOC~~ SOLN
120.0000 mg | Freq: Once | SUBCUTANEOUS | Status: AC
  Administered 2017-06-02: 120 mg via SUBCUTANEOUS
  Filled 2017-06-02: qty 1.7

## 2017-06-02 NOTE — Progress Notes (Signed)
[name] presents today for injection per the provider's orders.  Xgeva administration without incident; see MAR for injection details.  Patient tolerated procedure well and without incident.  No questions or complaints noted at this time.  Discharged ambulatory.  

## 2017-06-04 ENCOUNTER — Ambulatory Visit (HOSPITAL_COMMUNITY): Payer: Medicare Other

## 2017-06-04 ENCOUNTER — Other Ambulatory Visit (HOSPITAL_COMMUNITY): Payer: Medicare Other

## 2017-06-11 ENCOUNTER — Telehealth: Payer: Self-pay | Admitting: Family Medicine

## 2017-06-11 ENCOUNTER — Encounter: Payer: Self-pay | Admitting: Family Medicine

## 2017-06-11 ENCOUNTER — Ambulatory Visit (INDEPENDENT_AMBULATORY_CARE_PROVIDER_SITE_OTHER): Payer: Medicare Other | Admitting: Family Medicine

## 2017-06-11 VITALS — BP 120/86 | HR 61 | Temp 97.7°F | Resp 16 | Ht 70.0 in | Wt 267.8 lb

## 2017-06-11 DIAGNOSIS — K219 Gastro-esophageal reflux disease without esophagitis: Secondary | ICD-10-CM

## 2017-06-11 DIAGNOSIS — I10 Essential (primary) hypertension: Secondary | ICD-10-CM | POA: Diagnosis not present

## 2017-06-11 DIAGNOSIS — I499 Cardiac arrhythmia, unspecified: Secondary | ICD-10-CM | POA: Diagnosis not present

## 2017-06-11 DIAGNOSIS — M47816 Spondylosis without myelopathy or radiculopathy, lumbar region: Secondary | ICD-10-CM

## 2017-06-11 MED ORDER — TRAMADOL HCL 50 MG PO TABS: 50.0000 mg | ORAL_TABLET | Freq: Four times a day (QID) | ORAL | 0 refills | Status: DC | PRN

## 2017-06-11 NOTE — Patient Instructions (Addendum)
We will confirm the cardiology appointment  You may take tizanidine at bedtime This is a muscle relaxer If one does not help, you may take 2  See me in 2-3 months Call sooner for problems

## 2017-06-11 NOTE — Progress Notes (Signed)
Chief Complaint  Patient presents with  . Follow-up   Here for return.  Continues to ask each time for ritalin.   Is told each time that I am not comfortable writing.  He has a vague history of being put on this by a sleep doctor for daytime somnolence, but I have not ever received documentation to support.  In addition old records document hallucination at one point.  He has a limited understanding of health issues and is a poor historian.  I think the risk outweighs the benefit. Last visit he was found to have asymptomatic PVCs.  Abnormal EKG.  Referred to cardiology .  He tells me he never got an appt.  The chart reveals he had an appt on August 9th, he was made aware, and he failed to keep appt. I did a mini cog assessment on him today.  He drew a clock , placed correct hands and remembered 2/3 recall items. BP controlled. Back pain NOT a complaint today, still wants a back brace.  Refused repeatedly.  Patient Active Problem List   Diagnosis Date Noted  . Tubular adenoma of colon 12/03/2016  . Morbid obesity ([place]) 10/24/2016  . Essential hypertension 10/24/2016  . HLD (hyperlipidemia) 10/24/2016  . 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 Chronic back pain 10/24/2016  . PUD (peptic ulcer disease) 10/24/2016  . GERD (gastroesophageal reflux disease) 10/24/2016  . Hemorrhoids 10/24/2016  . Sleep apnea 10/24/2016  . Uncontrolled daytime somnolence 10/24/2016  . HOH (hard of hearing) 10/24/2016  . Testicular discomfort 05/07/2016  . 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 benazepril (LOTENSIN) 20 MG tablet Take 1 tablet (20 mg total) by mouth daily.  . Calcium Carb-Cholecalciferol (CALCIUM-VITAMIN D) 500-200 MG-UNIT tablet Take 1 tablet by mouth daily.  . cetirizine (ZYRTEC) 10 MG tablet Take 1 tablet (10 mg total) by mouth daily.  . hydrochlorothiazide (HYDRODIURIL) 25 MG  tablet Take 1 tablet (25 mg total) by mouth daily.  . meloxicam (MOBIC) 7.5 MG tablet Take 1 tablet (7.5 mg total) by mouth daily.  . metFORMIN (GLUCOPHAGE) 500 MG tablet Take 1 tablet (500 mg total) by mouth daily with breakfast. For weight and sugar  . methylphenidate (RITALIN) 20 MG tablet Take 1 tablet (20 mg total) by mouth daily.  Marland Kitchen omega-3 acid ethyl esters (LOVAZA) 1 g capsule Take 2 g by mouth 2 (two) times daily.  Marland Kitchen omeprazole (PRILOSEC) 40 MG capsule Take 1 capsule (40 mg total) by mouth daily.  Marland Kitchen selenium sulfide (SELSUN) 2.5 % shampoo Apply 1 application topically daily as needed for irritation.  Marland Kitchen tiZANidine (ZANAFLEX) 4 MG capsule Take 1 capsule (4 mg total) by mouth 2 (two) times daily.  . traMADol (ULTRAM) 50 MG tablet Take 1 tablet (50 mg total) by mouth every 6 (six) hours as needed. Pain   No facility-administered encounter medications on file as of 06/11/2017.     No Known Allergies  Review of Systems  Constitutional: Positive for fatigue. Negative for activity change and appetite change.       Daytime fatigue  HENT: Negative for congestion, sinus pain and sinus pressure.   Eyes: Negative for photophobia and visual disturbance.  Respiratory: Negative for cough and shortness of breath.   Cardiovascular: Negative for chest pain, palpitations and leg swelling.  Gastrointestinal: Positive for abdominal distention. Negative for constipation and diarrhea.  Overweight  Genitourinary:       Incontinence  Musculoskeletal: Positive for back pain. Negative for arthralgias.  Neurological: Negative for dizziness and light-headedness.  Psychiatric/Behavioral: Negative for dysphoric mood and sleep disturbance.       Says mood Is good      BP 120/86 (BP Location: Left Arm, Patient Position: Sitting, Cuff Size: Normal)   Pulse 61   Temp 97.7 F (36.5 C) (Other (Comment))   Resp 16   Ht 5\' 10"  (1.778 m)   Wt 267 lb 12 oz (121.5 kg)   SpO2 93%   BMI 38.42 kg/m    Physical Exam  Constitutional: He appears well-developed and well-nourished.  HENT:  Head: Normocephalic and atraumatic.  Mouth/Throat: Oropharynx is clear and moist.  Eyes: Pupils are equal, round, and reactive to light. Conjunctivae are normal.  Neck: Normal range of motion. Neck supple. No thyromegaly present.  Cardiovascular: Normal rate, regular rhythm and normal heart sounds.   occas. ectopy.  4-5  in 60 sec auscultation  Pulmonary/Chest: Effort normal and breath sounds normal. No respiratory distress.  Abdominal: Soft. Bowel sounds are normal.  protuberant  Musculoskeletal: Normal range of motion. He exhibits edema.  Lymphadenopathy:    He has no cervical adenopathy.  Neurological: He is alert.  Gait normal  Skin: Skin is warm. Rash noted.  Psychiatric: He has a normal mood and affect. His behavior is normal.  Poor knowledge base  Nursing note and vitals reviewed.   ASSESSMENT/PLAN:  1. Irregular heartbeat Referred again to cardiology  2. Essential hypertension Controlled  3. Gastroesophageal reflux disease, esophagitis presence not specified controlled  4. Osteoarthritis of lumbar spine, unspecified spinal osteoarthritis complication status Chronic back   Patient Instructions  We will confirm the cardiology appointment  You may take tizanidine at bedtime This is a muscle relaxer If one does not help, you may take 2  See me in 2-3 months Call sooner for problems     [name], MD

## 2017-06-11 NOTE — Telephone Encounter (Signed)
Left message on patients voice mail with appt info @[facility]  with Dr. Harl Bowie   appt is for September 7th (friday) @9 :20.  Patient shoulder arrive 51mins early.

## 2017-06-22 ENCOUNTER — Other Ambulatory Visit: Payer: Self-pay | Admitting: Family Medicine

## 2017-06-22 NOTE — Telephone Encounter (Signed)
Seen 8 16 18

## 2017-07-01 ENCOUNTER — Encounter (HOSPITAL_COMMUNITY): Payer: Medicare Other | Attending: Oncology

## 2017-07-01 DIAGNOSIS — C7951 Secondary malignant neoplasm of bone: Secondary | ICD-10-CM | POA: Insufficient documentation

## 2017-07-01 DIAGNOSIS — C61 Malignant neoplasm of prostate: Secondary | ICD-10-CM | POA: Insufficient documentation

## 2017-07-01 LAB — COMPREHENSIVE METABOLIC PANEL
ALK PHOS: 42 U/L (ref 38–126)
ALT: 20 U/L (ref 17–63)
ANION GAP: 8 (ref 5–15)
AST: 32 U/L (ref 15–41)
Albumin: 4.2 g/dL (ref 3.5–5.0)
BUN: 16 mg/dL (ref 6–20)
CALCIUM: 9.5 mg/dL (ref 8.9–10.3)
CO2: 27 mmol/L (ref 22–32)
Chloride: 105 mmol/L (ref 101–111)
Creatinine, Ser: 1.16 mg/dL (ref 0.61–1.24)
GFR calc non Af Amer: 59 mL/min — ABNORMAL LOW (ref >60)
Glucose, Bld: 117 mg/dL — ABNORMAL HIGH (ref 65–99)
Potassium: 3.9 mmol/L (ref 3.5–5.1)
SODIUM: 140 mmol/L (ref 135–145)
Total Bilirubin: 0.6 mg/dL (ref 0.3–1.2)
Total Protein: 7.1 g/dL (ref 6.5–8.1)

## 2017-07-01 LAB — CBC WITH DIFFERENTIAL/PLATELET
Basophils Absolute: 0 10*3/uL (ref 0.0–0.1)
Basophils Relative: 0 %
EOS ABS: 0.2 10*3/uL (ref 0.0–0.7)
EOS PCT: 3 %
HCT: 37.8 % — ABNORMAL LOW (ref 39.0–52.0)
HEMOGLOBIN: 12.8 g/dL — AB (ref 13.0–17.0)
LYMPHS ABS: 2.4 10*3/uL (ref 0.7–4.0)
Lymphocytes Relative: 36 %
MCH: 28.8 pg (ref 26.0–34.0)
MCHC: 33.9 g/dL (ref 30.0–36.0)
MCV: 85.1 fL (ref 78.0–100.0)
MONO ABS: 0.4 10*3/uL (ref 0.1–1.0)
MONOS PCT: 6 %
Neutro Abs: 3.6 10*3/uL (ref 1.7–7.7)
Neutrophils Relative %: 55 %
PLATELETS: 156 10*3/uL (ref 150–400)
RBC: 4.44 MIL/uL (ref 4.22–5.81)
RDW: 14.4 % (ref 11.5–15.5)
WBC: 6.5 10*3/uL (ref 4.0–10.5)

## 2017-07-01 LAB — PSA: Prostatic Specific Antigen: 0.41 ng/mL (ref 0.00–4.00)

## 2017-07-01 NOTE — Progress Notes (Signed)
[address] [place], [place] [address]   CLINIC:  Medical Oncology/Hematology  PCP:  [name], MD [phone] 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   CURRENT THERAPY: Depo-Lupron (with [facility]) AND Delton See monthly     BRIEF ONCOLOGIC HISTORY:  (From Dr. Donald Pore last note on 09/03/16)      INTERVAL HISTORY:  Mr. Thum 76 y.o. male who presents for continued follow-up for metastatic prostate cancer with bone mets.    States that he has established care with urologist here in town "[place]ross the street."  He thinks he is due to be seen there "sometime next month", but cannot recall.  He is a poor historian regarding details of his medical history. (i.e. When asking him about his last Lupron injection, he states "I don't get that. I get a shot in my belly").   Overall, he is largely without complaints today with the exception of occasional back pain "but I think that's muscle pain when I do too much and I get tired in my legs."  Denies any focal bone pain.  Denies cough, shortness of breath, fever, chills, dysuria, hematuria, dysuria, dizziness, or headaches.  His bowels are moving well.  Energy levels 75%; appetite 100%. Weight is stable.   Does report erectile dysfunction.  He wants to know what he can do about this.  States that he has tried "a little white pill for it, but it didn't do any good. I've heard I can get a pump."   He had CT scan of his chest at [facility] on 05/28/17 for (L) axillary pain; there was no radiologic cause of his axillary pain identified on imaging at that time. "I have to go back to [facility] tomorrow so they can check it out again, but I don't think there's anything there."   Due today for next dose of Xgeva today.   Of note: Patient is very poor historian and has difficulty remembering details of his  medical history and treatments.     REVIEW OF SYSTEMS:  Review of Systems  Constitutional: Positive for fatigue (occasionally ). Negative for chills and fever.  HENT:  Negative.   Eyes: Negative.   Respiratory: Negative.  Negative for cough and shortness of breath.   Cardiovascular: Negative.   Gastrointestinal: Negative.  Negative for abdominal pain, blood in stool, constipation, diarrhea, nausea and vomiting.  Genitourinary: Negative for dysuria and hematuria.        Erectile dysfunction  Musculoskeletal: Positive for back pain and myalgias.  Skin: Negative.  Negative for rash.  Neurological: Negative.  Negative for dizziness and headaches.  Hematological: Negative.   Psychiatric/Behavioral: Negative.      PAST MEDICAL/SURGICAL HISTORY:  Past Medical History:  Diagnosis Date  . Cancer [facility])    prostate  . GERD (gastroesophageal reflux disease)   . Hyperlipidemia   . Hypertension   . 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 Sleep apnea 10/24/2016   Past Surgical History:  Procedure Laterality Date  . CHOLECYSTECTOMY    . HAND SURGERY    . INGUINAL HERNIA REPAIR Left   . PROSTATECTOMY    . PROSTATECTOMY    . radical prostatectomy    . TONSILLECTOMY       SOCIAL HISTORY:  Social History   Social History  . Marital status: Widowed    Spouse name:  N/A  . Number of children: 3  . Years of education: 9   Occupational History  . handyman- retired     [employer]   Social History Main Topics  . Smoking status: Former Smoker    Packs/day: 1.00    Years: 32.00    Types: Cigarettes    Quit date: 02/04/1989  . Smokeless tobacco: Never Used  . Alcohol use No  . Drug use: No  . Sexual activity: Not Currently   Other Topics Concern  . Not on file   Social History Narrative   Widowed   [place] with son   Chronic low back pain    FAMILY HISTORY:  Family History  Problem Relation Age of Onset  . Heart failure Mother        CHF  .  Prostate cancer Father   . Narcolepsy Sister   . Cancer Daughter     CURRENT MEDICATIONS:  Outpatient Encounter Prescriptions as of 07/02/2017  Medication Sig  . benazepril (LOTENSIN) 20 MG tablet Take 1 tablet (20 mg total) by mouth daily.  . Calcium Carb-Cholecalciferol (CALCIUM-VITAMIN D) 500-200 MG-UNIT tablet Take 1 tablet by mouth daily.  . calcium-vitamin D (OSCAL WITH D) 500-200 MG-UNIT TABS tablet Take 1 tablet by mouth daily.  . cetirizine (ZYRTEC) 10 MG tablet Take 1 tablet (10 mg total) by mouth daily.  . hydrochlorothiazide (HYDRODIURIL) 25 MG tablet Take 1 tablet (25 mg total) by mouth daily.  . meloxicam (MOBIC) 7.5 MG tablet TAKE ONE TABLET BY MOUTH DAILY.  . metFORMIN (GLUCOPHAGE) 500 MG tablet Take 1 tablet (500 mg total) by mouth daily with breakfast. For weight and sugar  . methylphenidate (RITALIN) 20 MG tablet Take 1 tablet (20 mg total) by mouth daily.  Marland Kitchen omega-3 acid ethyl esters (LOVAZA) 1 g capsule Take 2 g by mouth 2 (two) times daily.  Marland Kitchen omeprazole (PRILOSEC) 40 MG capsule Take 1 capsule (40 mg total) by mouth daily.  Marland Kitchen selenium sulfide (SELSUN) 2.5 % shampoo Apply 1 application topically daily as needed for irritation.  Marland Kitchen tiZANidine (ZANAFLEX) 4 MG tablet TAKE ONE TABLET BY MOUTH TWICE DAILY  . traMADol (ULTRAM) 50 MG tablet Take 1 tablet (50 mg total) by mouth every 6 (six) hours as needed. Pain   No facility-administered encounter medications on file as of 07/02/2017.     ALLERGIES:  No Known Allergies   PHYSICAL EXAM:  ECOG Performance status: 1 - Symptomatic, but independent.   Vitals:   07/02/17 1029  BP: (!) 121/54  Pulse: 67  Resp: 18  SpO2: 100%   Filed Weights   07/02/17 1029  Weight: 269 lb (122 kg)    Physical Exam  Constitutional: He is oriented to person, place, and time and well-developed, well-nourished, and in no distress.  HENT:  Head: Normocephalic.  Mouth/Throat: Oropharynx is clear and moist. No oropharyngeal exudate.    Eyes: Pupils are equal, round, and reactive to light. Conjunctivae are normal. No scleral icterus.  Neck: Normal range of motion. Neck supple.  Cardiovascular: Normal rate and regular rhythm.   Pulmonary/Chest: Effort normal and breath sounds normal. He has no wheezes.  Abdominal: Soft. Bowel sounds are normal. There is no tenderness.  Musculoskeletal: Normal range of motion. He exhibits edema (Trace ankle edema).  Lymphadenopathy:    He has no cervical adenopathy.       Right: No supraclavicular adenopathy present.       Left: No supraclavicular adenopathy present.  Neurological: He is alert and  oriented to person, place, and time. No cranial nerve deficit. Gait normal.  Skin: Skin is warm and dry. No rash noted.  Psychiatric: Mood, memory, affect and judgment normal.  Nursing note and vitals reviewed.    LABORATORY DATA:  I have reviewed the labs as listed.  CBC    Component Value Date/Time   WBC 6.5 07/01/2017 0938   RBC 4.44 07/01/2017 0938   HGB 12.8 (L) 07/01/2017 0938   HCT 37.8 (L) 07/01/2017 0938   PLT 156 07/01/2017 0938   MCV 85.1 07/01/2017 0938   MCH 28.8 07/01/2017 0938   MCHC 33.9 07/01/2017 0938   RDW 14.4 07/01/2017 0938   LYMPHSABS 2.4 07/01/2017 0938   MONOABS 0.4 07/01/2017 0938   EOSABS 0.2 07/01/2017 0938   BASOSABS 0.0 07/01/2017 0938   CMP Latest Ref Rng & Units 07/01/2017 06/02/2017 05/08/2017  Glucose 65 - 99 mg/dL 117(H) 105(H) 86  BUN 6 - 20 mg/dL 16 15 9   Creatinine 0.61 - 1.24 mg/dL 1.16 1.12 1.02  Sodium 135 - 145 mmol/L 140 138 137  Potassium 3.5 - 5.1 mmol/L 3.9 3.8 3.7  Chloride 101 - 111 mmol/L 105 102 103  CO2 22 - 32 mmol/L 27 28 24   Calcium 8.9 - 10.3 mg/dL 9.5 9.2 8.8(L)  Total Protein 6.5 - 8.1 g/dL 7.1 6.8 6.8  Total Bilirubin 0.3 - 1.2 mg/dL 0.6 0.6 0.7  Alkaline Phos 38 - 126 U/L 42 51 48  AST 15 - 41 U/L 32 29 32  ALT 17 - 63 U/L 20 19 19    Results for Lavery, SHADOW SCHEDLER (MRN [id number])   Ref. Range 07/02/2016 13:25 08/06/2016  10:49 09/03/2016 10:11 11/11/2016 09:40 12/10/2016 10:00  PSA Latest Ref Range: 0.00 - 4.00 ng/mL 0.15 0.12 0.10 0.10 0.13   Results for Tsang, DAVY WESTMORELAND (MRN [id number])   Ref. 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  -Per patient, he is being seen by [facility]; unclear when his last Lupron injection was given. I will have my nurse give their office a call to request recent office visit note and date of last injection.  -PSA has doubled in the past 3 months, which is concerning for progressive disease. PSA in 03/2017 was 0.19; now is 0.41.  -Clinically, he expresses worsening back pain. Based on rising PSA and patient's clinical symptoms, will obtain restaging CT chest/abd/pelvis and bone scan for further evaluation.  Shared with patient my concern for progressive disease.  We will try to get these scans done as soon as able.  -Return to cancer center for follow-up with Dr. Talbert Cage to discuss further treatment planning based on diagnostic imaging results.   High-risk of bone mets:  -Labs reviewed and calcium adequate for Xgeva injection today.  Continue monthly Xgeva.   Erectile dysfunction:  -States that he took "a little white pill" for erectile dysfunction, but reports continued symptoms. Cannot recall name of medication.  -Encouraged him to discuss with his urologist for other options that may be available for him. Defer to urology's management as they deem clinically appropriate.  Dispo:  -Restaging CT chest/abd/pelvis and bone scan as soon as able for rising PSA.  -Return to cancer center a few days after scans for follow-up with Dr. Talbert Cage.   -Continue monthly Delton See.      All questions were answered to patient's stated satisfaction. Encouraged patient to call with any new concerns or questions before his next visit to the cancer center and we can certain see him sooner, if needed.       Orders placed this encounter:  Orders Placed This Encounter  Procedures  . CT Chest W Contrast  . CT Abdomen Pelvis W Contrast  . NM Bone Scan Whole Body      [name], NP [place] [phone]

## 2017-07-02 ENCOUNTER — Encounter (HOSPITAL_COMMUNITY): Payer: Self-pay | Admitting: Adult Health

## 2017-07-02 ENCOUNTER — Ambulatory Visit (HOSPITAL_COMMUNITY): Payer: Medicare Other

## 2017-07-02 ENCOUNTER — Encounter (HOSPITAL_BASED_OUTPATIENT_CLINIC_OR_DEPARTMENT_OTHER): Payer: Medicare Other | Admitting: Adult Health

## 2017-07-02 ENCOUNTER — Other Ambulatory Visit (HOSPITAL_COMMUNITY): Payer: Medicare Other

## 2017-07-02 ENCOUNTER — Encounter (HOSPITAL_BASED_OUTPATIENT_CLINIC_OR_DEPARTMENT_OTHER): Payer: Medicare Other

## 2017-07-02 VITALS — BP 121/54 | HR 67 | Resp 18 | Ht 69.5 in | Wt 269.0 lb

## 2017-07-02 DIAGNOSIS — C7951 Secondary malignant neoplasm of bone: Secondary | ICD-10-CM

## 2017-07-02 DIAGNOSIS — C61 Malignant neoplasm of prostate: Secondary | ICD-10-CM

## 2017-07-02 DIAGNOSIS — R9721 Rising PSA following treatment for malignant neoplasm of prostate: Secondary | ICD-10-CM | POA: Diagnosis not present

## 2017-07-02 DIAGNOSIS — N529 Male erectile dysfunction, unspecified: Secondary | ICD-10-CM | POA: Diagnosis not present

## 2017-07-02 MED ORDER — DENOSUMAB 120 MG/1.7ML ~~LOC~~ SOLN
120.0000 mg | Freq: Once | SUBCUTANEOUS | Status: AC
  Administered 2017-07-02: 120 mg via SUBCUTANEOUS
  Filled 2017-07-02: qty 1.7

## 2017-07-02 NOTE — Progress Notes (Signed)
[name] presents today for injection per MD orders. Xgeva 120 mg administered SQ in left lower abdomen. Administration without incident. Patient tolerated treatment without incidence. Patient discharged ambulatory and in stable condition from clinic. Patient to follow up as scheduled.

## 2017-07-02 NOTE — Patient Instructions (Signed)
[facility] at [facility] Discharge Instructions  RECOMMENDATIONS MADE BY THE CONSULTANT AND ANY TEST RESULTS WILL BE SENT TO YOUR REFERRING PHYSICIAN.  You received your Xgeva injection today Follow up as scheduled.  Thank you for choosing [facility] at [facility] to provide your oncology and hematology care.  To afford each patient quality time with our provider, please arrive at least 15 minutes before your scheduled appointment time.    If you have a lab appointment with the Cancer Center please come in thru the  Main Entrance and check in at the main information desk  You need to re-schedule your appointment should you arrive 10 or more minutes late.  We strive to give you quality time with our providers, and arriving late affects you and other patients whose appointments are after yours.  Also, if you no show three or more times for appointments you may be dismissed from the clinic at the providers discretion.     Again, thank you for choosing [facility].  Our hope is that these requests will decrease the amount of time that you wait before being seen by our physicians.       _____________________________________________________________  Should you have questions after your visit to [facility], please contact our office at [phone] between the hours of 8:30 a.m. and 4:30 p.m.  Voicemails left after 4:30 p.m. will not be returned until the following business day.  For prescription refill requests, have your pharmacy contact our office.       Resources For Cancer Patients and their Caregivers ? American Cancer Society: Can assist with transportation, wigs, general needs, runs Look Good Feel Better.        1-888-227-6333 ? Cancer Care: Provides financial assistance, online support groups, medication/co-pay assistance.  1-800-813-HOPE (4673) ? [facility] Assists [place]  cancer patients and their families through emotional , educational and financial support.  336-427-4357 ? [place] DSS Where to apply for food stamps, Medicaid and utility assistance. 336-342-1394 ? RCATS: Transportation to medical appointments. 336-347-2287 ? Social Security Administration: May apply for disability if have a Stage IV cancer. 336-342-7796 1-800-772-1213 ? [place] Aging, Disability and Transit Services: Assists with nutrition, care and transit needs. 336-349-2343  Cancer Center Support Programs: @10RELATIVEDAYS@ > Cancer Support Group  2nd Tuesday of the month 1pm-2pm, Journey Room  > Creative Journey  3rd Tuesday of the month 1130am-1pm, Journey Room  > Look Good Feel Better  1st Wednesday of the month 10am-12 noon, Journey Room (Call American Cancer Society to register 1-800-395-5775)    

## 2017-07-02 NOTE — Patient Instructions (Signed)
[facility] at [facility] Discharge Instructions  RECOMMENDATIONS MADE BY THE CONSULTANT AND ANY TEST RESULTS WILL BE SENT TO YOUR REFERRING PHYSICIAN.  You were seen today by [name] NP. Return for scans in the next week. Return after scans for follow up and plan.   Thank you for choosing [name] at [facility] to provide your oncology and hematology care.  To afford each patient quality time with our provider, please arrive at least 15 minutes before your scheduled appointment time.    If you have a lab appointment with the [facility] please come in thru the  Main Entrance and check in at the main information desk  You need to re-schedule your appointment should you arrive 10 or more minutes late.  We strive to give you quality time with our providers, and arriving late affects you and other patients whose appointments are after yours.  Also, if you no show three or more times for appointments you may be dismissed from the clinic at the providers discretion.     Again, thank you for choosing [facility].  Our hope is that these requests will decrease the amount of time that you wait before being seen by our physicians.       _____________________________________________________________  Should you have questions after your visit to [facility], please contact our office at [phone] between the hours of 8:30 a.m. and 4:30 p.m.  Voicemails left after 4:30 p.m. will not be returned until the following business day.  For prescription refill requests, have your pharmacy contact our office.       Resources For Cancer Patients and their Caregivers ? American Cancer Society: Can assist with transportation, wigs, general needs, runs Look Good Feel Better.        (203) 415-1665 ? Cancer Care: Provides financial assistance, online support groups, medication/co-pay assistance.  1-800-813-HOPE  417-808-1129) ? [facility] Assists [place] cancer patients and their families through emotional , educational and financial support.  713 863 1528 ? [place] DSS Where to apply for food stamps, Medicaid and utility assistance. 713-150-1682 ? RCATS: Transportation to medical appointments. 251 424 9348 ? Social Security Administration: May apply for disability if have a Stage IV cancer. (716)798-5389 432-596-5205 ? [employer], Disability and Transit Services: Assists with nutrition, care and transit needs. [facility] Support Programs: @10RELATIVEDAYS @ > Cancer Support Group  2nd Tuesday of the month 1pm-2pm, Journey Room  > Creative Journey  3rd Tuesday of the month 1130am-1pm, Journey Room  > Look Good Feel Better  1st Wednesday of the month 10am-12 noon, Journey Room (Call [place] to register [phone])

## 2017-07-03 ENCOUNTER — Encounter: Payer: Self-pay | Admitting: Cardiology

## 2017-07-03 ENCOUNTER — Ambulatory Visit (INDEPENDENT_AMBULATORY_CARE_PROVIDER_SITE_OTHER): Payer: Medicare Other | Admitting: Cardiology

## 2017-07-03 VITALS — BP 120/72 | HR 69 | Ht 69.0 in | Wt 268.0 lb

## 2017-07-03 DIAGNOSIS — I493 Ventricular premature depolarization: Secondary | ICD-10-CM | POA: Diagnosis not present

## 2017-07-03 DIAGNOSIS — R011 Cardiac murmur, unspecified: Secondary | ICD-10-CM | POA: Diagnosis not present

## 2017-07-03 NOTE — Patient Instructions (Signed)
Medication Instructions:  Your physician recommends that you continue on your current medications as directed. Please refer to the Current Medication list given to you today.   Labwork: NONE   Testing/Procedures: Your physician has requested that you have an echocardiogram. Echocardiography is a painless test that uses sound waves to create images of your heart. It provides your doctor with information about the size and shape of your heart and how well your heart's chambers and valves are working. This procedure takes approximately one hour. There are no restrictions for this procedure.  Your physician has recommended that you wear a holter monitor. Holter monitors are medical devices that record the heart's electrical activity. Doctors most often use these monitors to diagnose arrhythmias. Arrhythmias are problems with the speed or rhythm of the heartbeat. The monitor is a small, portable device. You can wear one while you do your normal daily activities. This is usually used to diagnose what is causing palpitations/syncope (passing out).    Follow-Up: Your physician recommends that you schedule a follow-up appointment To Be Determined.    Any Other Special Instructions Will Be Listed Below (If Applicable).     If you need a refill on your cardiac medications before your next appointment, please call your pharmacy.  Thank you for choosing [facility]!

## 2017-07-03 NOTE — Progress Notes (Signed)
Clinical Summary [name] is a 76 y.o.male seen as new consult, referrd by Dr Meda Coffee for PVCs and irregular heart beat   1. Irregular heart beat/PVCs - denies any palpitations - no chest pain, no SOB/DOE. Occasional LE edema - highest level of activity is using push mower x 1 hour which he tolerates without limitation - has been on ritalin, currnentl on hold    Past Medical History:  Diagnosis Date  . Cancer [facility])    prostate  . GERD (gastroesophageal reflux disease)   . Hyperlipidemia   . Hypertension   . 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 Sleep apnea 10/24/2016     No Known Allergies   Current Outpatient Prescriptions  Medication Sig Dispense Refill  . benazepril (LOTENSIN) 20 MG tablet Take 1 tablet (20 mg total) by mouth daily. 90 tablet 3  . Calcium Carb-Cholecalciferol (CALCIUM-VITAMIN D) 500-200 MG-UNIT tablet Take 1 tablet by mouth daily. 90 tablet 3  . calcium-vitamin D (OSCAL WITH D) 500-200 MG-UNIT TABS tablet Take 1 tablet by mouth daily.  3  . cetirizine (ZYRTEC) 10 MG tablet Take 1 tablet (10 mg total) by mouth daily. 90 tablet 3  . hydrochlorothiazide (HYDRODIURIL) 25 MG tablet Take 1 tablet (25 mg total) by mouth daily. 90 tablet 3  . meloxicam (MOBIC) 7.5 MG tablet TAKE ONE TABLET BY MOUTH DAILY. 30 tablet 0  . metFORMIN (GLUCOPHAGE) 500 MG tablet Take 1 tablet (500 mg total) by mouth daily with breakfast. For weight and sugar 90 tablet 1  . methylphenidate (RITALIN) 20 MG tablet Take 1 tablet (20 mg total) by mouth daily. 30 tablet 0  . omega-3 acid ethyl esters (LOVAZA) 1 g capsule Take 2 g by mouth 2 (two) times daily.    Marland Kitchen omeprazole (PRILOSEC) 40 MG capsule Take 1 capsule (40 mg total) by mouth daily. 30 capsule 3  . selenium sulfide (SELSUN) 2.5 % shampoo Apply 1 application topically daily as needed for irritation. 118 mL 12  . tiZANidine (ZANAFLEX) 4 MG tablet TAKE ONE TABLET BY MOUTH TWICE DAILY 60 tablet 0  . traMADol  (ULTRAM) 50 MG tablet Take 1 tablet (50 mg total) by mouth every 6 (six) hours as needed. Pain 90 tablet 0   No current facility-administered medications for this visit.      Past Surgical History:  Procedure Laterality Date  . CHOLECYSTECTOMY    . HAND SURGERY    . INGUINAL HERNIA REPAIR Left   . PROSTATECTOMY    . PROSTATECTOMY    . radical prostatectomy    . TONSILLECTOMY       No Known Allergies    Family History  Problem Relation Age of Onset  . Heart failure Mother        CHF  . Prostate cancer Father   . Narcolepsy Sister   . Cancer Daughter      Social History Mr. Danzy reports that he quit smoking about 28 years ago. His smoking use included Cigarettes. He has a 32.00 pack-year smoking history. He has never used smokeless tobacco. Mr. Boardley reports that he does not drink alcohol.   Review of Systems CONSTITUTIONAL: No weight loss, fever, chills, weakness or fatigue.  HEENT: Eyes: No visual loss, blurred vision, double vision or yellow sclerae.No hearing loss, sneezing, congestion, runny nose or sore throat.  SKIN: No rash or itching.  CARDIOVASCULAR: per hpi RESPIRATORY: No shortness of breath, cough or sputum.  GASTROINTESTINAL: No anorexia, nausea, vomiting  or diarrhea. No abdominal pain or blood.  GENITOURINARY: No burning on urination, no polyuria NEUROLOGICAL: No headache, dizziness, syncope, paralysis, ataxia, numbness or tingling in the extremities. No change in bowel or bladder control.  MUSCULOSKELETAL: No muscle, back pain, joint pain or stiffness.  LYMPHATICS: No enlarged nodes. No history of splenectomy.  PSYCHIATRIC: No history of depression or anxiety.  ENDOCRINOLOGIC: No reports of sweating, cold or heat intolerance. No polyuria or polydipsia.  Marland Kitchen   Physical Examination Vitals:   07/03/17 0910 07/03/17 0916  BP: 118/70 120/72  Pulse: 69   SpO2: 98%    Vitals:   07/03/17 0910  Weight: 268 lb (121.6 kg)  Height: 5\' 9"  (1.753 m)     Gen: resting comfortably, no acute distress HEENT: no scleral icterus, pupils equal round and reactive, no palptable cervical adenopathy,  CV: RRR, 2/6 systolic murmur rusb, no jvd Resp: Clear to auscultation bilaterally GI: abdomen is soft, non-tender, non-distended, normal bowel sounds, no hepatosplenomegaly MSK: extremities are warm, no edema.  Skin: warm, no rash Neuro:  no focal deficits Psych: appropriate affect     Assessment and Plan  1. PVCs/Irregular heart - we will obtain a 24 hr holter monitor to evaluate PVC burden  2. Heart murmur - obtain echo      Arnoldo Lenis, M.D., F.A.C.C.

## 2017-07-06 ENCOUNTER — Other Ambulatory Visit: Payer: Self-pay

## 2017-07-06 ENCOUNTER — Telehealth: Payer: Self-pay | Admitting: Family Medicine

## 2017-07-07 ENCOUNTER — Ambulatory Visit (HOSPITAL_COMMUNITY)
Admission: RE | Admit: 2017-07-07 | Discharge: 2017-07-07 | Disposition: A | Discharge status: Home / Self Care | Payer: Medicare Other | Source: Ambulatory Visit | Attending: Cardiology | Admitting: Cardiology

## 2017-07-07 ENCOUNTER — Ambulatory Visit (HOSPITAL_BASED_OUTPATIENT_CLINIC_OR_DEPARTMENT_OTHER)
Admission: RE | Admit: 2017-07-07 | Discharge: 2017-07-07 | Disposition: A | Discharge status: Home / Self Care | Payer: Medicare Other | Source: Ambulatory Visit | Attending: Cardiology | Admitting: Cardiology

## 2017-07-07 ENCOUNTER — Encounter (HOSPITAL_COMMUNITY): Payer: Self-pay

## 2017-07-07 ENCOUNTER — Encounter (HOSPITAL_COMMUNITY)
Admission: RE | Admit: 2017-07-07 | Discharge: 2017-07-07 | Disposition: A | Discharge status: Home / Self Care | Payer: Medicare Other | Source: Ambulatory Visit | Attending: Adult Health | Admitting: Adult Health

## 2017-07-07 ENCOUNTER — Other Ambulatory Visit (HOSPITAL_COMMUNITY): Payer: Medicare Other

## 2017-07-07 DIAGNOSIS — R011 Cardiac murmur, unspecified: Secondary | ICD-10-CM | POA: Diagnosis not present

## 2017-07-07 DIAGNOSIS — I119 Hypertensive heart disease without heart failure: Secondary | ICD-10-CM | POA: Diagnosis not present

## 2017-07-07 DIAGNOSIS — G4730 Sleep apnea, unspecified: Secondary | ICD-10-CM | POA: Insufficient documentation

## 2017-07-07 DIAGNOSIS — K219 Gastro-esophageal reflux disease without esophagitis: Secondary | ICD-10-CM | POA: Insufficient documentation

## 2017-07-07 DIAGNOSIS — I082 Rheumatic disorders of both aortic and tricuspid valves: Secondary | ICD-10-CM | POA: Insufficient documentation

## 2017-07-07 DIAGNOSIS — Z6839 Body mass index (BMI) 39.0-39.9, adult: Secondary | ICD-10-CM | POA: Insufficient documentation

## 2017-07-07 DIAGNOSIS — C7951 Secondary malignant neoplasm of bone: Secondary | ICD-10-CM | POA: Diagnosis not present

## 2017-07-07 DIAGNOSIS — E785 Hyperlipidemia, unspecified: Secondary | ICD-10-CM | POA: Diagnosis not present

## 2017-07-07 DIAGNOSIS — C61 Malignant neoplasm of prostate: Secondary | ICD-10-CM | POA: Insufficient documentation

## 2017-07-07 DIAGNOSIS — I493 Ventricular premature depolarization: Secondary | ICD-10-CM

## 2017-07-07 LAB — ECHOCARDIOGRAM COMPLETE
CHL CUP DOP CALC LVOT VTI: 28.1 cm
E decel time: 243 msec
EERAT: 10.59
FS: 41 % (ref 28–44)
IV/PV OW: 1.07
LA diam end sys: 35 mm
LADIAMINDEX: 1.41 cm/m2
LASIZE: 35 mm
LAVOL: 70.5 mL
LAVOLA4C: 69.3 mL
LAVOLIN: 28.4 mL/m2
LDCA: 3.14 cm2
LV PW d: 11.2 mm — AB (ref 0.6–1.1)
LV SIMPSON'S DISK: 68
LV dias vol index: 32 mL/m2
LV e' LATERAL: 6.96 cm/s
LVDIAVOL: 79 mL (ref 62–150)
LVEEAVG: 10.59
LVEEMED: 10.59
LVOT SV: 88 mL
LVOT diameter: 20 mm
LVOT peak grad rest: 6 mmHg
LVOT peak vel: 124 cm/s
LVSYSVOL: 25 mL
LVSYSVOLIN: 10 mL/m2
Lateral S' vel: 10.9 cm/s
MV Dec: 243
MV Peak grad: 2 mmHg
MVPKAVEL: 113 m/s
MVPKEVEL: 73.7 m/s
RV sys press: 30 mmHg
Reg peak vel: 262 cm/s
Stroke v: 54 ml
TAPSE: 13.9 mm
TDI e' lateral: 6.96
TDI e' medial: 5.22
TRMAXVEL: 262 cm/s

## 2017-07-07 MED ORDER — TECHNETIUM TC 99M MEDRONATE IV KIT
20.0000 | PACK | Freq: Once | INTRAVENOUS | Status: AC | PRN
  Administered 2017-07-07: 21 via INTRAVENOUS

## 2017-07-07 NOTE — Progress Notes (Signed)
*  PRELIMINARY RESULTS* Echocardiogram 2D Echocardiogram has been performed.  [name] 07/07/2017, 11:25 AM

## 2017-07-08 ENCOUNTER — Telehealth: Payer: Self-pay | Admitting: Family Medicine

## 2017-07-08 NOTE — Telephone Encounter (Signed)
Patient requesting oral medication for groin infection.  He states you had written for him a few months ago  Please [place]   pts cb  [phone]

## 2017-07-09 ENCOUNTER — Other Ambulatory Visit (HOSPITAL_COMMUNITY): Payer: Self-pay | Admitting: Adult Health

## 2017-07-09 DIAGNOSIS — R9721 Rising PSA following treatment for malignant neoplasm of prostate: Secondary | ICD-10-CM

## 2017-07-09 DIAGNOSIS — C7951 Secondary malignant neoplasm of bone: Secondary | ICD-10-CM

## 2017-07-09 DIAGNOSIS — C61 Malignant neoplasm of prostate: Secondary | ICD-10-CM

## 2017-07-09 MED ORDER — CLOTRIMAZOLE-BETAMETHASONE 1-0.05 % EX CREA
1.0000 | TOPICAL_CREAM | Freq: Two times a day (BID) | CUTANEOUS | 0 refills | Status: DC
Start: 2017-07-09 — End: 2020-03-19

## 2017-07-09 NOTE — Telephone Encounter (Signed)
Refill his lotrisone X1

## 2017-07-09 NOTE — Telephone Encounter (Signed)
Left message rx sent to pharmacy

## 2017-07-09 NOTE — Telephone Encounter (Signed)
Called [name], states he has an infection in his groin area and you have given him pills in the past for this

## 2017-07-13 ENCOUNTER — Other Ambulatory Visit: Payer: Self-pay | Admitting: Family Medicine

## 2017-07-13 ENCOUNTER — Telehealth: Payer: Self-pay

## 2017-07-13 NOTE — Telephone Encounter (Signed)
Left detailed message on pt's private voicemail.  

## 2017-07-13 NOTE — Telephone Encounter (Signed)
-----   Message from Arnoldo Lenis, MD sent at 07/13/2017  2:53 PM EDT ----- Echo looks good, normal heart function   Zandra Abts MD

## 2017-07-13 NOTE — Telephone Encounter (Signed)
Seen 8 16 18

## 2017-07-14 ENCOUNTER — Ambulatory Visit (HOSPITAL_COMMUNITY)
Admission: RE | Admit: 2017-07-14 | Discharge: 2017-07-14 | Disposition: A | Discharge status: Home / Self Care | Payer: Medicare Other | Source: Ambulatory Visit | Attending: Adult Health | Admitting: Adult Health

## 2017-07-14 ENCOUNTER — Telehealth: Payer: Self-pay | Admitting: Family Medicine

## 2017-07-14 ENCOUNTER — Ambulatory Visit (HOSPITAL_COMMUNITY): Payer: Medicare Other

## 2017-07-14 DIAGNOSIS — M4854XA Collapsed vertebra, not elsewhere classified, thoracic region, initial encounter for fracture: Secondary | ICD-10-CM | POA: Diagnosis not present

## 2017-07-14 DIAGNOSIS — N281 Cyst of kidney, acquired: Secondary | ICD-10-CM | POA: Insufficient documentation

## 2017-07-14 DIAGNOSIS — C61 Malignant neoplasm of prostate: Secondary | ICD-10-CM | POA: Diagnosis not present

## 2017-07-14 DIAGNOSIS — K6289 Other specified diseases of anus and rectum: Secondary | ICD-10-CM | POA: Insufficient documentation

## 2017-07-14 DIAGNOSIS — C7951 Secondary malignant neoplasm of bone: Secondary | ICD-10-CM

## 2017-07-14 DIAGNOSIS — R911 Solitary pulmonary nodule: Secondary | ICD-10-CM | POA: Diagnosis not present

## 2017-07-14 DIAGNOSIS — Z9889 Other specified postprocedural states: Secondary | ICD-10-CM | POA: Diagnosis not present

## 2017-07-14 MED ORDER — IOPAMIDOL (ISOVUE-300) INJECTION 61%
100.0000 mL | Freq: Once | INTRAVENOUS | Status: AC | PRN
  Administered 2017-07-14: 100 mL via INTRAVENOUS

## 2017-07-14 NOTE — Telephone Encounter (Signed)
Given rx for tramadol.

## 2017-07-14 NOTE — Telephone Encounter (Signed)
Patient is here requesting Ultram and Ritalin Rx   cb  [phone]

## 2017-07-16 ENCOUNTER — Ambulatory Visit (HOSPITAL_COMMUNITY)
Admission: RE | Admit: 2017-07-16 | Discharge: 2017-07-16 | Disposition: A | Discharge status: Home / Self Care | Payer: Medicare Other | Source: Ambulatory Visit | Attending: Adult Health | Admitting: Adult Health

## 2017-07-16 DIAGNOSIS — C7951 Secondary malignant neoplasm of bone: Secondary | ICD-10-CM | POA: Diagnosis not present

## 2017-07-16 DIAGNOSIS — C61 Malignant neoplasm of prostate: Secondary | ICD-10-CM | POA: Diagnosis not present

## 2017-07-16 DIAGNOSIS — M5124 Other intervertebral disc displacement, thoracic region: Secondary | ICD-10-CM | POA: Diagnosis not present

## 2017-07-16 DIAGNOSIS — R9721 Rising PSA following treatment for malignant neoplasm of prostate: Secondary | ICD-10-CM | POA: Diagnosis not present

## 2017-07-16 DIAGNOSIS — M4802 Spinal stenosis, cervical region: Secondary | ICD-10-CM | POA: Insufficient documentation

## 2017-07-16 DIAGNOSIS — M4854XA Collapsed vertebra, not elsewhere classified, thoracic region, initial encounter for fracture: Secondary | ICD-10-CM | POA: Insufficient documentation

## 2017-07-16 DIAGNOSIS — M4804 Spinal stenosis, thoracic region: Secondary | ICD-10-CM | POA: Insufficient documentation

## 2017-07-16 MED ORDER — GADOBENATE DIMEGLUMINE 529 MG/ML IV SOLN
20.0000 mL | Freq: Once | INTRAVENOUS | Status: AC | PRN
  Administered 2017-07-16: 20 mL via INTRAVENOUS

## 2017-07-20 ENCOUNTER — Ambulatory Visit (HOSPITAL_COMMUNITY): Payer: Medicare Other

## 2017-07-20 ENCOUNTER — Encounter (HOSPITAL_BASED_OUTPATIENT_CLINIC_OR_DEPARTMENT_OTHER): Payer: Medicare Other | Admitting: Oncology

## 2017-07-20 VITALS — BP 116/56 | HR 80 | Resp 16 | Ht 69.5 in | Wt 268.8 lb

## 2017-07-20 DIAGNOSIS — C7951 Secondary malignant neoplasm of bone: Secondary | ICD-10-CM | POA: Diagnosis not present

## 2017-07-20 DIAGNOSIS — C61 Malignant neoplasm of prostate: Secondary | ICD-10-CM

## 2017-07-20 DIAGNOSIS — R918 Other nonspecific abnormal finding of lung field: Secondary | ICD-10-CM

## 2017-07-20 DIAGNOSIS — N529 Male erectile dysfunction, unspecified: Secondary | ICD-10-CM | POA: Diagnosis not present

## 2017-07-20 NOTE — Progress Notes (Signed)
[name], [place] [address]   CLINIC:  Medical Oncology/Hematology  PCP:  [name] Everts, MD [phone] 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   CURRENT THERAPY: Depo-Lupron (with [facility]) AND Delton See monthly     BRIEF ONCOLOGIC HISTORY:  (From Dr. Donald Pore last note on 09/03/16)      INTERVAL HISTORY:  [name] 76 y.o. male who presents for continued follow-up for metastatic prostate cancer with bone mets.    Patient presents today for review of his restaging scans. His last Lupron injection was on 05/13/2017 he was given a dose of 22.5 mg. Complains of chronic back pain and erectile dysfunction today but otherwise has no new complaints.    REVIEW OF SYSTEMS:  Review of Systems  Constitutional: Negative for chills, fatigue and fever.  HENT:  Negative.   Eyes: Negative.   Respiratory: Negative.  Negative for cough and shortness of breath.   Cardiovascular: Negative.   Gastrointestinal: Negative.  Negative for abdominal pain, blood in stool, constipation, diarrhea, nausea and vomiting.  Genitourinary: Negative for dysuria and hematuria.        Erectile dysfunction  Musculoskeletal: Positive for back pain. Negative for myalgias.  Skin: Negative.  Negative for rash.  Neurological: Negative.  Negative for dizziness and headaches.  Hematological: Negative.   Psychiatric/Behavioral: Negative.      PAST MEDICAL/SURGICAL HISTORY:  Past Medical History:  Diagnosis Date  . Cancer [facility])    prostate  . GERD (gastroesophageal reflux disease)   . Hyperlipidemia   . Hypertension   . 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 Sleep apnea 10/24/2016   Past Surgical History:  Procedure Laterality Date  . CHOLECYSTECTOMY    . HAND SURGERY    . INGUINAL HERNIA REPAIR Left   . PROSTATECTOMY    .  PROSTATECTOMY    . radical prostatectomy    . TONSILLECTOMY       SOCIAL HISTORY:  Social History   Social History  . Marital status: Widowed    Spouse name: N/A  . Number of children: 3  . Years of education: 9   Occupational History  . handyman- retired     [employer]   Social History Main Topics  . Smoking status: Former Smoker    Packs/day: 1.00    Years: 32.00    Types: Cigarettes    Quit date: 02/04/1989  . Smokeless tobacco: Never Used  . Alcohol use No  . Drug use: No  . Sexual activity: Not Currently   Other Topics Concern  . Not on file   Social History Narrative   Widowed   [place] with son   Chronic low back pain    FAMILY HISTORY:  Family History  Problem Relation Age of Onset  . Heart failure Mother        CHF  . Prostate cancer Father   . Narcolepsy Sister   . Cancer Daughter     CURRENT MEDICATIONS:  Outpatient Encounter Prescriptions as of 07/20/2017  Medication Sig  . benazepril (LOTENSIN) 20 MG tablet Take 1 tablet (20 mg total) by mouth daily.  . Calcium Carb-Cholecalciferol (CALCIUM-VITAMIN D) 500-200 MG-UNIT tablet Take 1 tablet by mouth daily.  . calcium-vitamin D (OSCAL WITH D) 500-200 MG-UNIT TABS tablet Take 1 tablet by mouth daily.  . cetirizine (ZYRTEC) 10 MG tablet  Take 1 tablet (10 mg total) by mouth daily.  . clotrimazole-betamethasone (LOTRISONE) cream Apply 1 application topically 2 (two) times daily.  . hydrochlorothiazide (HYDRODIURIL) 25 MG tablet Take 1 tablet (25 mg total) by mouth daily.  . meloxicam (MOBIC) 7.5 MG tablet TAKE ONE TABLET BY MOUTH DAILY.  . metFORMIN (GLUCOPHAGE) 500 MG tablet Take 1 tablet (500 mg total) by mouth daily with breakfast. For weight and sugar  . methylphenidate (RITALIN) 20 MG tablet Take 1 tablet (20 mg total) by mouth daily.  Marland Kitchen omega-3 acid ethyl esters (LOVAZA) 1 g capsule Take 2 g by mouth 2 (two) times daily.  Marland Kitchen omeprazole (PRILOSEC) 40 MG capsule Take 1 capsule (40 mg total) by  mouth daily.  Marland Kitchen selenium sulfide (SELSUN) 2.5 % shampoo Apply 1 application topically daily as needed for irritation.  Marland Kitchen tiZANidine (ZANAFLEX) 4 MG tablet TAKE ONE TABLET BY MOUTH TWICE DAILY  . traMADol (ULTRAM) 50 MG tablet TAKE ONE TABLET BY MOUTH EVERY 6 HOURS AS NEEDED FOR PAIN.   No facility-administered encounter medications on file as of 07/20/2017.     ALLERGIES:  No Known Allergies   PHYSICAL EXAM:  ECOG Performance status: 1 - Symptomatic, but independent.   Vitals:   07/20/17 1342  BP: (!) 116/56  Pulse: 80  Resp: 16  SpO2: 98%   Filed Weights   07/20/17 1342  Weight: 268 lb 12.8 oz (121.9 kg)    Physical Exam  Constitutional: He is oriented to person, place, and time and well-developed, well-nourished, and in no distress.  HENT:  Head: Normocephalic.  Mouth/Throat: Oropharynx is clear and moist. No oropharyngeal exudate.  Eyes: Pupils are equal, round, and reactive to light. Conjunctivae are normal. No scleral icterus.  Neck: Normal range of motion. Neck supple.  Cardiovascular: Normal rate and regular rhythm.   Pulmonary/Chest: Effort normal and breath sounds normal. He has no wheezes.  Abdominal: Soft. Bowel sounds are normal. There is no tenderness.  Musculoskeletal: Normal range of motion. He exhibits no edema.  Lymphadenopathy:    He has no cervical adenopathy.       Right: No supraclavicular adenopathy present.       Left: No supraclavicular adenopathy present.  Neurological: He is alert and oriented to person, place, and time. No cranial nerve deficit. Gait normal.  Skin: Skin is warm and dry. No rash noted.  Psychiatric: Mood, memory, affect and judgment normal.  Nursing note and vitals reviewed.    LABORATORY DATA:  I have reviewed the labs as listed.  CBC    Component Value Date/Time   WBC 6.5 07/01/2017 0938   RBC 4.44 07/01/2017 0938   HGB 12.8 (L) 07/01/2017 0938   HCT 37.8 (L) 07/01/2017 0938   PLT 156 07/01/2017 0938   MCV 85.1  07/01/2017 0938   MCH 28.8 07/01/2017 0938   MCHC 33.9 07/01/2017 0938   RDW 14.4 07/01/2017 0938   LYMPHSABS 2.4 07/01/2017 0938   MONOABS 0.4 07/01/2017 0938   EOSABS 0.2 07/01/2017 0938   BASOSABS 0.0 07/01/2017 0938   CMP Latest Ref Rng & Units 07/01/2017 06/02/2017 05/08/2017  Glucose 65 - 99 mg/dL 117(H) 105(H) 86  BUN 6 - 20 mg/dL 16 15 9   Creatinine 0.61 - 1.24 mg/dL 1.16 1.12 1.02  Sodium 135 - 145 mmol/L 140 138 137  Potassium 3.5 - 5.1 mmol/L 3.9 3.8 3.7  Chloride 101 - 111 mmol/L 105 102 103  CO2 22 - 32 mmol/L 27 28 24   Calcium 8.9 -  10.3 mg/dL 9.5 9.2 8.8(L)  Total Protein 6.5 - 8.1 g/dL 7.1 6.8 6.8  Total Bilirubin 0.3 - 1.2 mg/dL 0.6 0.6 0.7  Alkaline Phos 38 - 126 U/L 42 51 48  AST 15 - 41 U/L 32 29 32  ALT 17 - 63 U/L 20 19 19    Results for Nguyen, AKSHATH MCCAREY (MRN [id number])   Ref. Range 07/02/2016 13:25 08/06/2016 10:49 09/03/2016 10:11 11/11/2016 09:40 12/10/2016 10:00  PSA Latest Ref Range: 0.00 - 4.00 ng/mL 0.15 0.12 0.10 0.10 0.13   Results for Ndiaye, KAVEON BLATZ (MRN [id number])   Ref. 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  -Per patient, he is being seen by [facility]; unclear when his last Lupron injection was given. I will have my nurse give their office a call to request recent office visit note and date of last injection.  -PSA has doubled in the past 3 months, which is concerning for progressive disease. PSA in 03/2017 was 0.19; now is 0.41.  -I have reviewed his restaging CT chest/abd/pelvis (07/14/17) and bone scan (07/07/17) and the scans were negative for progressive disease. Plan to restage with repeat CT C/A/P in 6  months.   -Continue ADT with lupron 22.5 mg [place] q3 months through his urologist, next dose on 08/13/17.  High-risk of bone mets:  -Labs reviewed and calcium adequate for Xgeva injection today.  -Continue monthly Xgeva.   Erectile dysfunction:  -Defer to urology's management as they deem clinically appropriate.    Small subcentimeter pulm nodules: -Will continue surveillance. Repeat CT chest in 6 months.        Dispo:  -RTC in 6 months with labs and restaging scans.   All questions were answered to patient's stated satisfaction. Encouraged patient to call with any new concerns or questions before his next visit to the cancer center and we can certain see him sooner, if needed.       Orders placed this encounter:  Orders Placed This Encounter  Procedures  . CT Abdomen Pelvis W Contrast  . CT Chest W Contrast  . CBC with Differential  . Comprehensive metabolic panel  . PSA    Twana First, MD

## 2017-07-22 ENCOUNTER — Telehealth: Payer: Self-pay

## 2017-07-22 ENCOUNTER — Other Ambulatory Visit: Payer: Self-pay | Admitting: Family Medicine

## 2017-07-22 DIAGNOSIS — I493 Ventricular premature depolarization: Secondary | ICD-10-CM

## 2017-07-22 NOTE — Telephone Encounter (Signed)
-----   Message from Massie Maroon, [place] sent at 07/22/2017  1:43 PM EDT -----   ----- Message ----- From: Arnoldo Lenis, MD Sent: 07/21/2017  12:50 PM To: Staci T Ashworth, CMA  Heart monitor does show some episodes of an abnormal rhythm. He needs a stress test to look for possible causes of this rhythm. Please order lexiscan for PVCs/NSVT   J BrancH MD

## 2017-07-22 NOTE — Telephone Encounter (Signed)
Called pt. No answer, left message for pt to return call. Placed order for lexi.

## 2017-07-23 ENCOUNTER — Ambulatory Visit (INDEPENDENT_AMBULATORY_CARE_PROVIDER_SITE_OTHER): Payer: Medicare Other | Admitting: Otolaryngology

## 2017-07-23 ENCOUNTER — Telehealth: Payer: Self-pay | Admitting: Cardiology

## 2017-07-23 NOTE — Telephone Encounter (Signed)
Error/tg °

## 2017-07-30 ENCOUNTER — Other Ambulatory Visit (HOSPITAL_COMMUNITY): Payer: Medicare Other

## 2017-07-30 ENCOUNTER — Telehealth (HOSPITAL_COMMUNITY): Payer: Self-pay

## 2017-07-30 ENCOUNTER — Ambulatory Visit (HOSPITAL_COMMUNITY): Payer: Medicare Other

## 2017-07-30 NOTE — Telephone Encounter (Signed)
Called pt and left message for pt to reschedule his Xgeva injection which he missed today. Pt instructed to call back to possibly reschedule for tomorrow

## 2017-07-31 ENCOUNTER — Encounter (HOSPITAL_COMMUNITY): Payer: Medicare Other | Attending: Oncology

## 2017-07-31 ENCOUNTER — Encounter (HOSPITAL_COMMUNITY): Payer: Self-pay

## 2017-07-31 ENCOUNTER — Other Ambulatory Visit (HOSPITAL_COMMUNITY): Payer: Self-pay | Admitting: Oncology

## 2017-07-31 VITALS — BP 143/69 | HR 64 | Temp 98.0°F | Resp 18

## 2017-07-31 DIAGNOSIS — C61 Malignant neoplasm of prostate: Secondary | ICD-10-CM | POA: Diagnosis not present

## 2017-07-31 DIAGNOSIS — C7951 Secondary malignant neoplasm of bone: Secondary | ICD-10-CM | POA: Insufficient documentation

## 2017-07-31 DIAGNOSIS — K219 Gastro-esophageal reflux disease without esophagitis: Secondary | ICD-10-CM

## 2017-07-31 LAB — COMPREHENSIVE METABOLIC PANEL
ALBUMIN: 3.9 g/dL (ref 3.5–5.0)
ALT: 18 U/L (ref 17–63)
AST: 41 U/L (ref 15–41)
Alkaline Phosphatase: 49 U/L (ref 38–126)
Anion gap: 10 (ref 5–15)
BILIRUBIN TOTAL: 1 mg/dL (ref 0.3–1.2)
BUN: 12 mg/dL (ref 6–20)
CHLORIDE: 99 mmol/L — AB (ref 101–111)
CO2: 28 mmol/L (ref 22–32)
CREATININE: 1.2 mg/dL (ref 0.61–1.24)
Calcium: 9.6 mg/dL (ref 8.9–10.3)
GFR calc Af Amer: >60 mL/min (ref >60)
GFR calc non Af Amer: 57 mL/min — ABNORMAL LOW (ref >60)
Glucose, Bld: 83 mg/dL (ref 65–99)
POTASSIUM: 4.1 mmol/L (ref 3.5–5.1)
SODIUM: 137 mmol/L (ref 135–145)
TOTAL PROTEIN: 6.7 g/dL (ref 6.5–8.1)

## 2017-07-31 MED ORDER — DENOSUMAB 120 MG/1.7ML ~~LOC~~ SOLN
120.0000 mg | Freq: Once | SUBCUTANEOUS | Status: AC
  Administered 2017-07-31: 120 mg via SUBCUTANEOUS
  Filled 2017-07-31: qty 1.7

## 2017-07-31 NOTE — Progress Notes (Signed)
[name] tolerated Xgeva injection well without complaints or incident. Calcium 9.6 today and pt denies any tooth,jaw or leg pain as well as recent or future dental visits.Pt reports taking his Calcium pills as prescribed VSS Pt discharged self ambulatory in satisfactory condition

## 2017-07-31 NOTE — Patient Instructions (Signed)
[facility] at [facility] Discharge Instructions  RECOMMENDATIONS MADE BY THE CONSULTANT AND ANY TEST RESULTS WILL BE SENT TO YOUR REFERRING PHYSICIAN.  Received Xgeva injection today. Follow-up as scheduled. Call clinic for any questions or concerns  Thank you for choosing [facility] at [facility] to provide your oncology and hematology care.  To afford each patient quality time with our provider, please arrive at least 15 minutes before your scheduled appointment time.    If you have a lab appointment with the Cancer Center please come in thru the  Main Entrance and check in at the main information desk  You need to re-schedule your appointment should you arrive 10 or more minutes late.  We strive to give you quality time with our providers, and arriving late affects you and other patients whose appointments are after yours.  Also, if you no show three or more times for appointments you may be dismissed from the clinic at the providers discretion.     Again, thank you for choosing [facility].  Our hope is that these requests will decrease the amount of time that you wait before being seen by our physicians.       _____________________________________________________________  Should you have questions after your visit to [facility], please contact our office at [phone] between the hours of 8:30 a.m. and 4:30 p.m.  Voicemails left after 4:30 p.m. will not be returned until the following business day.  For prescription refill requests, have your pharmacy contact our office.       Resources For Cancer Patients and their Caregivers ? American Cancer Society: Can assist with transportation, wigs, general needs, runs Look Good Feel Better.        1-888-227-6333 ? Cancer Care: Provides financial assistance, online support groups, medication/co-pay assistance.  1-800-813-HOPE (4673) ? [facility] Assists [place] cancer patients and their families through emotional , educational and financial support.  336-427-4357 ? [place] DSS Where to apply for food stamps, Medicaid and utility assistance. 336-342-1394 ? RCATS: Transportation to medical appointments. 336-347-2287 ? Social Security Administration: May apply for disability if have a Stage IV cancer. 336-342-7796 1-800-772-1213 ? [place] Aging, Disability and Transit Services: Assists with nutrition, care and transit needs. 336-349-2343  Cancer Center Support Programs: [email or web address]@ > Cancer Support Group  2nd Tuesday of the month 1pm-2pm, Journey Room  > Creative Journey  3rd Tuesday of the month 1130am-1pm, Journey Room  > Look Good Feel Better  1st Wednesday of the month 10am-12 noon, Journey Room (Call American Cancer Society to register 1-800-395-5775)   

## 2017-08-03 ENCOUNTER — Ambulatory Visit (HOSPITAL_COMMUNITY)
Admission: RE | Admit: 2017-08-03 | Discharge: 2017-08-03 | Disposition: A | Discharge status: Home / Self Care | Payer: Medicare Other | Source: Ambulatory Visit | Attending: Cardiology | Admitting: Cardiology

## 2017-08-03 ENCOUNTER — Encounter (HOSPITAL_COMMUNITY): Payer: Self-pay

## 2017-08-03 ENCOUNTER — Encounter (HOSPITAL_COMMUNITY)
Admission: RE | Admit: 2017-08-03 | Discharge: 2017-08-03 | Disposition: A | Discharge status: Home / Self Care | Payer: Medicare Other | Source: Ambulatory Visit | Attending: Cardiology | Admitting: Cardiology

## 2017-08-03 DIAGNOSIS — I493 Ventricular premature depolarization: Secondary | ICD-10-CM | POA: Diagnosis not present

## 2017-08-03 LAB — NM MYOCAR MULTI W/SPECT W/WALL MOTION / EF
CHL CUP NUCLEAR SRS: 0
CHL CUP NUCLEAR SSS: 0
LHR: 0.31
LV sys vol: 37 mL
LVDIAVOL: 120 mL (ref 62–150)
Peak HR: 76 {beats}/min
Rest HR: 57 {beats}/min
SDS: 0
TID: 1.19

## 2017-08-03 MED ORDER — REGADENOSON 0.4 MG/5ML IV SOLN
INTRAVENOUS | Status: AC
  Administered 2017-08-03: 0.4 mg via INTRAVENOUS
  Filled 2017-08-03: qty 5

## 2017-08-03 MED ORDER — TECHNETIUM TC 99M TETROFOSMIN IV KIT
10.0000 | PACK | Freq: Once | INTRAVENOUS | Status: AC | PRN
  Administered 2017-08-03: 10.5 via INTRAVENOUS

## 2017-08-03 MED ORDER — TECHNETIUM TC 99M TETROFOSMIN IV KIT
30.0000 | PACK | Freq: Once | INTRAVENOUS | Status: AC | PRN
  Administered 2017-08-03: 30 via INTRAVENOUS

## 2017-08-03 MED ORDER — SODIUM CHLORIDE 0.9% FLUSH
INTRAVENOUS | Status: AC
  Administered 2017-08-03: 10 mL via INTRAVENOUS
  Filled 2017-08-03: qty 10

## 2017-08-05 ENCOUNTER — Other Ambulatory Visit: Payer: Self-pay

## 2017-08-05 ENCOUNTER — Telehealth: Payer: Self-pay | Admitting: Family Medicine

## 2017-08-05 MED ORDER — TRAMADOL HCL 50 MG PO TABS: 50.0000 mg | ORAL_TABLET | Freq: Four times a day (QID) | ORAL | 0 refills | Status: DC | PRN

## 2017-08-05 NOTE — Telephone Encounter (Signed)
Done, covered by dr hagler.

## 2017-08-05 NOTE — Telephone Encounter (Signed)
Patient here in waiting room NOW wanting to pick up [facility] Rx.  Says he was in the area and does not want to drive back home and then come back.

## 2017-08-12 ENCOUNTER — Ambulatory Visit: Payer: Medicare Other | Admitting: Urology

## 2017-08-14 ENCOUNTER — Other Ambulatory Visit: Payer: Self-pay | Admitting: Family Medicine

## 2017-08-14 NOTE — Telephone Encounter (Signed)
Seen 9 7 18

## 2017-08-25 ENCOUNTER — Other Ambulatory Visit: Payer: Self-pay | Admitting: Family Medicine

## 2017-08-25 NOTE — Telephone Encounter (Signed)
Seen 8 16  Has appt 11 16 19   Is this going to be a long term med.

## 2017-08-27 ENCOUNTER — Encounter (HOSPITAL_COMMUNITY): Payer: Self-pay

## 2017-08-27 ENCOUNTER — Encounter (HOSPITAL_BASED_OUTPATIENT_CLINIC_OR_DEPARTMENT_OTHER): Payer: Medicare Other

## 2017-08-27 ENCOUNTER — Encounter (HOSPITAL_COMMUNITY): Payer: Medicare Other | Attending: Oncology

## 2017-08-27 VITALS — BP 140/69 | HR 68 | Temp 98.0°F | Resp 18

## 2017-08-27 DIAGNOSIS — C61 Malignant neoplasm of prostate: Secondary | ICD-10-CM | POA: Insufficient documentation

## 2017-08-27 DIAGNOSIS — C7951 Secondary malignant neoplasm of bone: Secondary | ICD-10-CM | POA: Diagnosis not present

## 2017-08-27 LAB — COMPREHENSIVE METABOLIC PANEL
ALT: 18 U/L (ref 17–63)
AST: 26 U/L (ref 15–41)
Albumin: 4.1 g/dL (ref 3.5–5.0)
Alkaline Phosphatase: 57 U/L (ref 38–126)
Anion gap: 10 (ref 5–15)
BUN: 14 mg/dL (ref 6–20)
CHLORIDE: 102 mmol/L (ref 101–111)
CO2: 25 mmol/L (ref 22–32)
CREATININE: 1.09 mg/dL (ref 0.61–1.24)
Calcium: 9.6 mg/dL (ref 8.9–10.3)
GFR calc Af Amer: >60 mL/min (ref >60)
GFR calc non Af Amer: >60 mL/min (ref >60)
Glucose, Bld: 113 mg/dL — ABNORMAL HIGH (ref 65–99)
Potassium: 3.2 mmol/L — ABNORMAL LOW (ref 3.5–5.1)
SODIUM: 137 mmol/L (ref 135–145)
Total Bilirubin: 0.6 mg/dL (ref 0.3–1.2)
Total Protein: 6.9 g/dL (ref 6.5–8.1)

## 2017-08-27 MED ORDER — DENOSUMAB 120 MG/1.7ML ~~LOC~~ SOLN
120.0000 mg | Freq: Once | SUBCUTANEOUS | Status: AC
  Administered 2017-08-27: 120 mg via SUBCUTANEOUS
  Filled 2017-08-27: qty 1.7

## 2017-08-27 NOTE — Patient Instructions (Signed)
[facility] at [facility]  Discharge Instructions: You received an xgeva shot today.  _______________________________________________________________  Thank you for choosing [facility] at [facility] to provide your oncology and hematology care.  To afford each patient quality time with our providers, please arrive at least 15 minutes before your scheduled appointment.  You need to re-schedule your appointment if you arrive 10 or more minutes late.  We strive to give you quality time with our providers, and arriving late affects you and other patients whose appointments are after yours.  Also, if you no show three or more times for appointments you may be dismissed from the clinic.  Again, thank you for choosing [facility] at [facility]. Our hope is that these requests will allow you access to exceptional care and in a timely manner. _______________________________________________________________  If you have questions after your visit, please contact our office at [phone] between the hours of 8:30 a.m. and 5:00 p.m. Voicemails left after 4:30 p.m. will not be returned until the following business day. _______________________________________________________________  For prescription refill requests, have your pharmacy contact our office. _______________________________________________________________  Recommendations made by the consultant and any test results will be sent to your referring physician. _______________________________________________________________ 

## 2017-08-27 NOTE — Progress Notes (Signed)
Patient for xgeva shot today.  Denied jaw pain, tooth pain, and leg pain.  Taking calcium two tabs every morning as directed.  Cannot remember last dental appointment due to insurance.  Tolerated injection with no complaints voiced.  Injection site clean and dry with no bruising or swelling noted at site.  Band aid applied.  VSS with discharge and left ambulatory.

## 2017-08-28 ENCOUNTER — Other Ambulatory Visit (HOSPITAL_COMMUNITY): Payer: Self-pay | Admitting: Adult Health

## 2017-08-28 DIAGNOSIS — E876 Hypokalemia: Secondary | ICD-10-CM

## 2017-08-28 MED ORDER — POTASSIUM CHLORIDE ER 20 MEQ PO TBCR: 1.0000 | EXTENDED_RELEASE_TABLET | Freq: Two times a day (BID) | ORAL | 0 refills | Status: DC

## 2017-09-11 ENCOUNTER — Encounter: Payer: Self-pay | Admitting: Family Medicine

## 2017-09-11 ENCOUNTER — Ambulatory Visit (INDEPENDENT_AMBULATORY_CARE_PROVIDER_SITE_OTHER): Payer: Medicare Other | Admitting: Family Medicine

## 2017-09-11 ENCOUNTER — Other Ambulatory Visit: Payer: Self-pay

## 2017-09-11 VITALS — BP 124/70 | HR 64 | Temp 98.9°F | Resp 18 | Ht 69.0 in | Wt 272.1 lb

## 2017-09-11 DIAGNOSIS — I499 Cardiac arrhythmia, unspecified: Secondary | ICD-10-CM

## 2017-09-11 DIAGNOSIS — E785 Hyperlipidemia, unspecified: Secondary | ICD-10-CM | POA: Diagnosis not present

## 2017-09-11 DIAGNOSIS — I10 Essential (primary) hypertension: Secondary | ICD-10-CM | POA: Diagnosis not present

## 2017-09-11 DIAGNOSIS — Z23 Encounter for immunization: Secondary | ICD-10-CM

## 2017-09-11 MED ORDER — TRAMADOL HCL 50 MG PO TABS: 50.0000 mg | ORAL_TABLET | Freq: Four times a day (QID) | ORAL | 0 refills | Status: DC | PRN

## 2017-09-11 MED ORDER — MELOXICAM 7.5 MG PO TABS: 7.5000 mg | ORAL_TABLET | Freq: Every day | ORAL | 3 refills | Status: DC

## 2017-09-11 NOTE — Patient Instructions (Signed)
See me every three months

## 2017-09-11 NOTE — Progress Notes (Signed)
Chief Complaint  Patient presents with  . Follow-up    2 month  Patient returns for routine follow-up He has seen cardiology since his last visit with me.  He had a Holter monitor and echocardiogram and a Lexiscan test, no ischemia was identified.  He does have some PVCs that are notable, but nothing that requires treatment. He is under the care of oncology and urology for his prostate cancer.  His PSA has increased, however his CT scans have not shown any increase in his mass or adenopathy. [name] states that he feels well.  His back pain is been managed with as needed tramadol.  He requests a refill of tramadol today. He requests again a prescription for Ritalin.  I explained to him, again, that I am not going to prescribe this for him.  He does not have a clear indication to use Ritalin, he states he uses it to "stay awake" and that is because of a "sleep disorder ", but he is never provided me with any copies of his sleep study, he refused to go to a new sleep study, he does not use any kind of CPAP.  He asks what he can do to stay awake, I recommend regular exercise, we discussed sleep hygiene, told him to drink a cup of coffee or tea. Patient tells me that he needs a tetanus shot today.  He is overdue for tetanus shot, does not actively have a wound.  I think he heard this from a family member because there is perhaps a grandchild coming.  We will update his tetanus  Patient Active Problem List   Diagnosis Date Noted  . Tubular adenoma of colon 12/03/2016  . Morbid obesity ([place]) 10/24/2016  . Essential hypertension 10/24/2016  . HLD (hyperlipidemia) 10/24/2016  . 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 Chronic back pain 10/24/2016  . PUD (peptic ulcer disease) 10/24/2016  . GERD (gastroesophageal reflux disease) 10/24/2016  . Hemorrhoids 10/24/2016  . Sleep apnea 10/24/2016  . Uncontrolled daytime somnolence 10/24/2016  . HOH (hard of hearing) 10/24/2016  .  Testicular discomfort 05/07/2016  . 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 benazepril (LOTENSIN) 20 MG tablet TAKE ONE TABLET BY MOUTH DAILY.  . Calcium Carb-Cholecalciferol (CALCIUM-VITAMIN D) 500-200 MG-UNIT tablet Take 1 tablet by mouth daily.  . calcium-vitamin D (OSCAL WITH D) 500-200 MG-UNIT TABS tablet Take 1 tablet by mouth daily.  . cetirizine (ZYRTEC) 10 MG tablet Take 1 tablet (10 mg total) by mouth daily.  . clotrimazole-betamethasone (LOTRISONE) cream Apply 1 application topically 2 (two) times daily.  . hydrochlorothiazide (HYDRODIURIL) 25 MG tablet TAKE ONE TABLET BY MOUTH DAILY.  . meloxicam (MOBIC) 7.5 MG tablet Take 1 tablet (7.5 mg total) daily by mouth.  . metFORMIN (GLUCOPHAGE) 500 MG tablet Take 1 tablet (500 mg total) by mouth daily with breakfast. For weight and sugar  . omega-3 acid ethyl esters (LOVAZA) 1 g capsule Take 2 g by mouth 2 (two) times daily.  Marland Kitchen omeprazole (PRILOSEC) 40 MG capsule TAKE ONE CAPSULE BY MOUTH DAILY  . Potassium Chloride ER 20 MEQ TBCR Take 1 tablet by mouth 2 (two) times daily.  Marland Kitchen selenium sulfide (SELSUN) 2.5 % shampoo Apply 1 application topically daily as needed for irritation.  Marland Kitchen tiZANidine (ZANAFLEX) 4 MG tablet TAKE ONE TABLET BY MOUTH TWICE DAILY  . traMADol (ULTRAM) 50 MG tablet Take 1  tablet (50 mg total) every 6 (six) hours as needed by mouth. for pain   No facility-administered encounter medications on file as of 09/11/2017.     No Known Allergies  Review of Systems  Constitutional: Positive for fatigue. Negative for activity change and appetite change.       Daytime fatigue  HENT: Negative for congestion, sinus pressure and sinus pain.   Eyes: Negative for photophobia and visual disturbance.  Respiratory: Negative for cough and shortness of breath.   Cardiovascular: Negative for chest pain, palpitations and leg swelling.    Gastrointestinal: Positive for abdominal distention. Negative for constipation and diarrhea.       Overweight  Genitourinary:       Incontinence  Musculoskeletal: Positive for back pain. Negative for arthralgias.       Manageable  Neurological: Negative for dizziness and light-headedness.  Psychiatric/Behavioral: Negative for dysphoric mood and sleep disturbance.       Says mood Is good    BP 124/70 (BP Location: Right Arm, Patient Position: Sitting, Cuff Size: Large)   Pulse 64   Temp 98.9 F (37.2 C) (Temporal)   Resp 18   Ht 5\' 9"  (1.753 m)   Wt 272 lb 1.9 oz (123.4 kg)   SpO2 98%   BMI 40.19 kg/m   Physical Exam  Constitutional: He appears well-developed and well-nourished.  HENT:  Head: Normocephalic and atraumatic.  Mouth/Throat: Oropharynx is clear and moist.  Eyes: Conjunctivae are normal. Pupils are equal, round, and reactive to light.  Neck: Normal range of motion. Neck supple. No thyromegaly present.  Cardiovascular: Normal rate, regular rhythm and normal heart sounds.  occas. ectopy.  4-5  in 60 sec auscultation  Pulmonary/Chest: Effort normal and breath sounds normal. No respiratory distress.  Abdominal: Soft. Bowel sounds are normal.  protuberant  Musculoskeletal: Normal range of motion. He exhibits edema.  Lymphadenopathy:    He has no cervical adenopathy.  Neurological: He is alert.  Gait normal  Skin: Skin is warm. Rash noted.  Psychiatric: He has a normal mood and affect. His behavior is normal.  Poor knowledge base  Nursing note and vitals reviewed.   ASSESSMENT/PLAN:  1. Irregular heartbeat Cardiology workup negative for ischemia or dangerous rhythm  2. Essential hypertension Controlled - CBC - COMPLETE METABOLIC PANEL WITH GFR - Urinalysis, Routine w reflex microscopic  3. Hyperlipidemia, unspecified hyperlipidemia type On medication.  Due for labs - Lipid panel  4. Need for prophylactic vaccination against diphtheria and  tetanus Done today   Patient Instructions  See me every three months    [name], MD

## 2017-09-12 ENCOUNTER — Other Ambulatory Visit: Payer: Self-pay | Admitting: Family Medicine

## 2017-09-14 ENCOUNTER — Other Ambulatory Visit: Payer: Self-pay

## 2017-09-14 ENCOUNTER — Other Ambulatory Visit (HOSPITAL_COMMUNITY): Payer: Self-pay | Admitting: Adult Health

## 2017-09-14 DIAGNOSIS — E876 Hypokalemia: Secondary | ICD-10-CM

## 2017-09-14 MED ORDER — POTASSIUM CHLORIDE ER 20 MEQ PO TBCR: 1.0000 | EXTENDED_RELEASE_TABLET | Freq: Two times a day (BID) | ORAL | 5 refills | Status: DC

## 2017-09-14 NOTE — Telephone Encounter (Signed)
Seen 11 16 18

## 2017-09-24 ENCOUNTER — Ambulatory Visit (INDEPENDENT_AMBULATORY_CARE_PROVIDER_SITE_OTHER): Payer: Medicare Other | Admitting: Otolaryngology

## 2017-09-24 ENCOUNTER — Encounter (HOSPITAL_COMMUNITY): Payer: Self-pay

## 2017-09-24 ENCOUNTER — Encounter (HOSPITAL_COMMUNITY): Payer: Medicare Other

## 2017-09-24 ENCOUNTER — Encounter (HOSPITAL_BASED_OUTPATIENT_CLINIC_OR_DEPARTMENT_OTHER): Payer: Medicare Other

## 2017-09-24 VITALS — BP 148/71 | HR 58 | Temp 97.6°F | Resp 20

## 2017-09-24 DIAGNOSIS — C7951 Secondary malignant neoplasm of bone: Secondary | ICD-10-CM

## 2017-09-24 DIAGNOSIS — C61 Malignant neoplasm of prostate: Secondary | ICD-10-CM

## 2017-09-24 LAB — COMPREHENSIVE METABOLIC PANEL
ALBUMIN: 4.3 g/dL (ref 3.5–5.0)
ALT: 18 U/L (ref 17–63)
ANION GAP: 9 (ref 5–15)
AST: 31 U/L (ref 15–41)
Alkaline Phosphatase: 58 U/L (ref 38–126)
BILIRUBIN TOTAL: 0.6 mg/dL (ref 0.3–1.2)
BUN: 16 mg/dL (ref 6–20)
CHLORIDE: 100 mmol/L — AB (ref 101–111)
CO2: 27 mmol/L (ref 22–32)
Calcium: 9.9 mg/dL (ref 8.9–10.3)
Creatinine, Ser: 1.14 mg/dL (ref 0.61–1.24)
GFR calc Af Amer: >60 mL/min (ref >60)
GFR calc non Af Amer: 60 mL/min — ABNORMAL LOW (ref >60)
GLUCOSE: 82 mg/dL (ref 65–99)
POTASSIUM: 3.7 mmol/L (ref 3.5–5.1)
SODIUM: 136 mmol/L (ref 135–145)
TOTAL PROTEIN: 7.2 g/dL (ref 6.5–8.1)

## 2017-09-24 MED ORDER — DENOSUMAB 120 MG/1.7ML ~~LOC~~ SOLN
120.0000 mg | Freq: Once | SUBCUTANEOUS | Status: AC
  Administered 2017-09-24: 120 mg via SUBCUTANEOUS
  Filled 2017-09-24: qty 1.7

## 2017-09-24 NOTE — Patient Instructions (Signed)
[facility] at [facility]  Discharge Instructions: You received an xgeva shot today.  _______________________________________________________________  Thank you for choosing [facility] at [facility] to provide your oncology and hematology care.  To afford each patient quality time with our providers, please arrive at least 15 minutes before your scheduled appointment.  You need to re-schedule your appointment if you arrive 10 or more minutes late.  We strive to give you quality time with our providers, and arriving late affects you and other patients whose appointments are after yours.  Also, if you no show three or more times for appointments you may be dismissed from the clinic.  Again, thank you for choosing [facility] at [facility]. Our hope is that these requests will allow you access to exceptional care and in a timely manner. _______________________________________________________________  If you have questions after your visit, please contact our office at [phone] between the hours of 8:30 a.m. and 5:00 p.m. Voicemails left after 4:30 p.m. will not be returned until the following business day. _______________________________________________________________  For prescription refill requests, have your pharmacy contact our office. _______________________________________________________________  Recommendations made by the consultant and any test results will be sent to your referring physician. _______________________________________________________________ 

## 2017-09-24 NOTE — Progress Notes (Signed)
Patient tolerated xgeva shot with no complaints voiced.  Injection site clean and dry with no bruising or swelling noted at site.  Band aid applied.  VSS with discharge and left ambulatory with no s/s of distress noted.

## 2017-10-07 ENCOUNTER — Other Ambulatory Visit: Payer: Self-pay | Admitting: Family Medicine

## 2017-10-07 ENCOUNTER — Ambulatory Visit (INDEPENDENT_AMBULATORY_CARE_PROVIDER_SITE_OTHER): Payer: Medicare Other | Admitting: Urology

## 2017-10-07 DIAGNOSIS — C61 Malignant neoplasm of prostate: Secondary | ICD-10-CM

## 2017-10-07 DIAGNOSIS — N3941 Urge incontinence: Secondary | ICD-10-CM | POA: Diagnosis not present

## 2017-10-07 NOTE — Telephone Encounter (Signed)
Seen 11 16 18

## 2017-10-22 ENCOUNTER — Other Ambulatory Visit (HOSPITAL_COMMUNITY): Payer: Medicare Other

## 2017-10-22 ENCOUNTER — Ambulatory Visit (HOSPITAL_COMMUNITY): Payer: Medicare Other

## 2017-10-23 ENCOUNTER — Other Ambulatory Visit: Payer: Self-pay

## 2017-10-23 ENCOUNTER — Encounter (HOSPITAL_BASED_OUTPATIENT_CLINIC_OR_DEPARTMENT_OTHER): Payer: Medicare Other

## 2017-10-23 ENCOUNTER — Encounter (HOSPITAL_COMMUNITY): Payer: Medicare Other | Attending: Adult Health

## 2017-10-23 ENCOUNTER — Encounter (HOSPITAL_COMMUNITY): Payer: Self-pay

## 2017-10-23 VITALS — BP 134/51 | HR 72 | Temp 98.7°F | Resp 20 | Wt 276.2 lb

## 2017-10-23 DIAGNOSIS — C61 Malignant neoplasm of prostate: Secondary | ICD-10-CM | POA: Diagnosis not present

## 2017-10-23 DIAGNOSIS — C7951 Secondary malignant neoplasm of bone: Secondary | ICD-10-CM | POA: Insufficient documentation

## 2017-10-23 LAB — COMPREHENSIVE METABOLIC PANEL
ALT: 20 U/L (ref 17–63)
ANION GAP: 11 (ref 5–15)
AST: 30 U/L (ref 15–41)
Albumin: 4.3 g/dL (ref 3.5–5.0)
Alkaline Phosphatase: 57 U/L (ref 38–126)
BUN: 11 mg/dL (ref 6–20)
CHLORIDE: 103 mmol/L (ref 101–111)
CO2: 25 mmol/L (ref 22–32)
Calcium: 9.4 mg/dL (ref 8.9–10.3)
Creatinine, Ser: 1.06 mg/dL (ref 0.61–1.24)
GFR calc non Af Amer: >60 mL/min (ref >60)
Glucose, Bld: 88 mg/dL (ref 65–99)
POTASSIUM: 4 mmol/L (ref 3.5–5.1)
SODIUM: 139 mmol/L (ref 135–145)
Total Bilirubin: 0.7 mg/dL (ref 0.3–1.2)
Total Protein: 7.3 g/dL (ref 6.5–8.1)

## 2017-10-23 MED ORDER — DENOSUMAB 120 MG/1.7ML ~~LOC~~ SOLN
120.0000 mg | Freq: Once | SUBCUTANEOUS | Status: AC
  Administered 2017-10-23: 120 mg via SUBCUTANEOUS
  Filled 2017-10-23: qty 1.7

## 2017-10-23 NOTE — Patient Instructions (Signed)
[facility] at [facility] Discharge Instructions  RECOMMENDATIONS MADE BY THE CONSULTANT AND ANY TEST RESULTS WILL BE SENT TO YOUR REFERRING PHYSICIAN.  You received your Xgeva injection today Follow up as scheduled.  Thank you for choosing [facility] at [facility] to provide your oncology and hematology care.  To afford each patient quality time with our provider, please arrive at least 15 minutes before your scheduled appointment time.    If you have a lab appointment with the Cancer Center please come in thru the  Main Entrance and check in at the main information desk  You need to re-schedule your appointment should you arrive 10 or more minutes late.  We strive to give you quality time with our providers, and arriving late affects you and other patients whose appointments are after yours.  Also, if you no show three or more times for appointments you may be dismissed from the clinic at the providers discretion.     Again, thank you for choosing [facility].  Our hope is that these requests will decrease the amount of time that you wait before being seen by our physicians.       _____________________________________________________________  Should you have questions after your visit to [facility], please contact our office at [phone] between the hours of 8:30 a.m. and 4:30 p.m.  Voicemails left after 4:30 p.m. will not be returned until the following business day.  For prescription refill requests, have your pharmacy contact our office.       Resources For Cancer Patients and their Caregivers ? American Cancer Society: Can assist with transportation, wigs, general needs, runs Look Good Feel Better.        1-888-227-6333 ? Cancer Care: Provides financial assistance, online support groups, medication/co-pay assistance.  1-800-813-HOPE (4673) ? [facility] Assists [place]  cancer patients and their families through emotional , educational and financial support.  336-427-4357 ? [place] DSS Where to apply for food stamps, Medicaid and utility assistance. 336-342-1394 ? RCATS: Transportation to medical appointments. 336-347-2287 ? Social Security Administration: May apply for disability if have a Stage IV cancer. 336-342-7796 1-800-772-1213 ? [place] Aging, Disability and Transit Services: Assists with nutrition, care and transit needs. 336-349-2343  Cancer Center Support Programs: @[email or web address]@ > Cancer Support Group  2nd Tuesday of the month 1pm-2pm, Journey Room  > Creative Journey  3rd Tuesday of the month 1130am-1pm, Journey Room  > Look Good Feel Better  1st Wednesday of the month 10am-12 noon, Journey Room (Call American Cancer Society to register 1-800-395-5775)    

## 2017-10-23 NOTE — Progress Notes (Signed)
[name] presents today for injection per MD orders. Xgeva 120 mg administered SQ in left lower abdomen. Administration without incident. Patient tolerated well. Patient tolerated treatment without incidence. Patient discharged ambulatory and in stable condition from clinic. Patient to follow up as scheduled.

## 2017-10-28 ENCOUNTER — Other Ambulatory Visit: Payer: Self-pay | Admitting: Family Medicine

## 2017-10-28 NOTE — Telephone Encounter (Signed)
PRESCRIPTIONS  Total Prescriptions: 36  Total Private Pay: 0   Fill Date ID Written Drug Qty Days Prescriber Rx # Pharmacy Refill Daily Dose * Pymt Type PMP  10/07/2017  1  10/07/2017  Tramadol Hcl 50 MG Tablet  30 5 Yv Nel  [id number] Mit (3484)  0 30.00 MME  Medicare  [place]  09/11/2017  1  09/11/2017  Tramadol Hcl 50 MG Tablet  30 8 Yv Nel  [id number] Mit (9326)  0 18.75 MME  Medicare  [place]  08/14/2017  1  07/13/2017  Tramadol Hcl 50 MG Tablet  90 23 Yv Nel  [id number] Mit (9983)  0 19.57 MME  Medicare  [place]  07/14/2017  1  07/14/2017  Tramadol Hcl 50 MG Tablet  90 30 Yv Nel  [id number] Mit (3484)  0 15.00 MME  Medicare  [place]  06/11/2017  1  06/11/2017  Tramadol Hcl 50 MG Tablet  90 23 Yv Nel  [id number] Mit (3484)  0 19.57 MME  Medicare  [place]  05/11/2017  1  05/08/2017  Tramadol Hcl 50 MG Tablet  90 23 Yv Nel  [id number] Mit (3484)  0 19.57 MME  Medicare  [place]  04/02/2017  1  04/02/2017  Tramadol Hcl 50 MG Tablet  90 23 Yv Nel  [id number] Mit (3484)  0 19.57 MME  Medicare  [place]  04/01/2017  1  03/31/2017  Methylphenidate 20 MG Tablet  30 30 Yv Nel  [id number] Mit (3484)  0  Medicare  [place]  03/05/2017  1  03/05/2017  Methylphenidate 20 MG Tablet  30 30 Yv Nel  [id number] Mit (3484)  0  Medicare  [place]  03/05/2017  1  03/05/2017  Tramadol Hcl 50 MG Tablet  90 23 Yv Nel  [id number] Mit (3484)  0 19.57 MME  Medicare  [place]  02/04/2017  1  02/03/2017  Tramadol Hcl 50 MG Tablet  90 23 Yv Nel  [id number] Mit (3484)  0 19.57 MME  Medicare  [place]  02/04/2017  1  02/03/2017  Methylphenidate 20 MG Tablet  30 30 Yv Nel  [id number] Mit (3484)  0  Medicare  [place]  12/30/2016  1  12/30/2016  Methylphenidate 20 MG Tablet  30 30 Yv Nel  [id number] Mit (3484)  0  Medicare  [place]  12/30/2016  1  12/30/2016  Tramadol Hcl 50 MG Tablet  90 23 Yv Nel  [id number] Mit (3484)  0 19.57 MME  Medicare  [place]  12/03/2016  1  12/01/2016  Tramadol Hcl 50 MG Tablet  90 23 Yv Nel  [id number] Mit (3484)  0 19.57 MME  Medicare  [place]  10/31/2016  1  10/17/2016  Methylphenidate 20 MG Tablet  30 30 Xa Has   [id number] Mit (6767)  0  Medicare  [place]  10/25/2016  1  10/25/2016  Tramadol Hcl 50 MG Tablet  90 23 Yv Nel  [id number] Ede (0252)  0 19.57 MME  Medicare  [place]  09/10/2016  1  09/09/2016  Tramadol Hcl 50 MG Tablet  90 30 Xa Has  [id number] Mit (9476)  0 15.00 MME  Medicare    09/10/2016  1  09/09/2016  Methylphenidate 20 MG Tablet  30 30 Xa Has  [id number] Mit (4656)  0  Medicare    08/11/2016  1  08/09/2016  Methylphenidate 20 MG Tablet  30 30 Xa Has  [id number] Mit (0017)  0  Medicare    08/11/2016  1  08/06/2016  Tramadol Hcl 50  MG Tablet  90 30 Xa Has  [id number] Mit (5176)  0 15.00 MME  Medicare  [place]  07/12/2016  1  07/08/2016  Methylphenidate 20 MG Tablet  30 30 Xa Has  [id number] Mit (0626)  0  Medicare  [place]  07/12/2016  1  05/06/2016  Tramadol Hcl 50 MG Tablet  90 30 Xa Has  [id number] Mit (7035)  2 15.00 MME  Medicare  [place]  06/12/2016  1  06/10/2016  Methylphenidate 20 MG Tablet  30 30 Xa Has  [id number] Mit (2993)  0  Medicare  [place]  06/10/2016  1  05/06/2016  Tramadol Hcl 50 MG Tablet  90 30 Xa Has  [id number] Mit (9381)  1 15.00 MME  Medicare  [place]  05/06/2016  1  05/06/2016  Tramadol Hcl 50 MG Tablet  90 30 Xa Has  [id number] Mit (3484)  0 15.00 MME  Medicare  [place]  05/06/2016  1  05/06/2016  Methylphenidate 20 MG Tablet  30 30 Xa Has  [id number] Mit (2423)  0  Medicare  [place]  04/03/2016  1  04/03/2016  Methylphenidate 20 MG Tablet  30 30 Xa Has  [id number] Mit (1540)  0  Medicare  [place]  04/03/2016  1  01/28/2016  Tramadol Hcl 50 MG Tablet  90 30 Xa Has  [id number] Mit (5093)  2 15.00 MME  Medicare  [place]  03/04/2016  1  02/25/2016  Methylphenidate 20 MG Tablet  30 30 Xa Has  [id number] Mit (8099)  0  Medicare  [place]  03/04/2016  1  01/28/2016  Tramadol Hcl 50 MG Tablet  90 30 Xa Has  [id number] Mit (5397)  1 15.00 MME  Medicare  [place]  01/31/2016  1  01/28/2016  Tramadol Hcl 50 MG Tablet  90 30 Xa Has  [id number] Mit (7902)  0 15.00 MME  Medicare  [place]  01/31/2016  1  01/28/2016  Methylphenidate 20 MG Tablet  30 30 Xa Has  [id number] Mit (2992)  0  Medicare   [place]  12/28/2015  1  09/10/2015  Tramadol Hcl 50 MG Tablet  90 30 Xa Has  [id number] Mit (9622)  2 15.00 MME  Medicare  [place]  12/10/2015  1  12/10/2015  Methylphenidate 20 MG Tablet  30 30 Xa Has  [id number] Mit (1194)  0  Medicare  [place]  11/06/2015  1  09/10/2015  Tramadol Hcl 50 MG Tablet  90 30 Xa Has  [id number] Mit (3484)  1 15.00 MME  Medicare  [place]  *Per CDC guidance, the MME conversion factors prescribed or provided as part of the medication-assisted treatment for opioid use disorder should not be used to benchmark against dosage thresholds meant for opioids prescribed for pain. Buprenorphine products have no agreed upon morphine equivalency, and as partial opioid agonists, are not expected to be associated with overdose risk in the same dose-dependent manner as doses for full agonist opioids. MME = morphine milligram equivalents. LME = Lorazepam milligram equivalents. MG = dose in milligrams.  PROVIDERS  Total Providers: 2  Name Baker Phone  Yvonne Sue Nelson [address] [place]  [id number]   Neale Burly [address]

## 2017-11-09 DIAGNOSIS — C61 Malignant neoplasm of prostate: Secondary | ICD-10-CM | POA: Diagnosis not present

## 2017-11-11 ENCOUNTER — Other Ambulatory Visit: Payer: Self-pay | Admitting: Family Medicine

## 2017-11-11 ENCOUNTER — Ambulatory Visit: Payer: Medicare Other | Admitting: Family Medicine

## 2017-11-11 NOTE — Telephone Encounter (Signed)
Seen 11 16 18

## 2017-11-17 ENCOUNTER — Other Ambulatory Visit: Payer: Self-pay | Admitting: Family Medicine

## 2017-11-18 ENCOUNTER — Ambulatory Visit: Payer: Medicare Other | Admitting: Urology

## 2017-11-19 ENCOUNTER — Encounter (HOSPITAL_COMMUNITY): Payer: Self-pay | Admitting: Internal Medicine

## 2017-11-19 ENCOUNTER — Other Ambulatory Visit: Payer: Self-pay

## 2017-11-19 ENCOUNTER — Inpatient Hospital Stay (HOSPITAL_COMMUNITY): Payer: Medicare Other

## 2017-11-19 ENCOUNTER — Inpatient Hospital Stay (HOSPITAL_COMMUNITY): Payer: Medicare Other | Attending: Internal Medicine | Admitting: Internal Medicine

## 2017-11-19 VITALS — BP 142/70 | HR 64 | Temp 98.7°F

## 2017-11-19 DIAGNOSIS — C7951 Secondary malignant neoplasm of bone: Secondary | ICD-10-CM

## 2017-11-19 DIAGNOSIS — C61 Malignant neoplasm of prostate: Secondary | ICD-10-CM | POA: Diagnosis not present

## 2017-11-19 DIAGNOSIS — R911 Solitary pulmonary nodule: Secondary | ICD-10-CM

## 2017-11-19 DIAGNOSIS — R935 Abnormal findings on diagnostic imaging of other abdominal regions, including retroperitoneum: Secondary | ICD-10-CM | POA: Diagnosis not present

## 2017-11-19 DIAGNOSIS — N281 Cyst of kidney, acquired: Secondary | ICD-10-CM | POA: Insufficient documentation

## 2017-11-19 DIAGNOSIS — M4854XA Collapsed vertebra, not elsewhere classified, thoracic region, initial encounter for fracture: Secondary | ICD-10-CM | POA: Diagnosis not present

## 2017-11-19 DIAGNOSIS — N529 Male erectile dysfunction, unspecified: Secondary | ICD-10-CM | POA: Diagnosis not present

## 2017-11-19 DIAGNOSIS — G8929 Other chronic pain: Secondary | ICD-10-CM | POA: Diagnosis not present

## 2017-11-19 LAB — CBC WITH DIFFERENTIAL/PLATELET
Basophils Absolute: 0 10*3/uL (ref 0.0–0.1)
Basophils Relative: 0 %
EOS ABS: 0.1 10*3/uL (ref 0.0–0.7)
EOS PCT: 2 %
HCT: 38.1 % — ABNORMAL LOW (ref 39.0–52.0)
Hemoglobin: 12.4 g/dL — ABNORMAL LOW (ref 13.0–17.0)
LYMPHS ABS: 2.9 10*3/uL (ref 0.7–4.0)
Lymphocytes Relative: 45 %
MCH: 28.4 pg (ref 26.0–34.0)
MCHC: 32.5 g/dL (ref 30.0–36.0)
MCV: 87.4 fL (ref 78.0–100.0)
MONO ABS: 0.3 10*3/uL (ref 0.1–1.0)
Monocytes Relative: 5 %
Neutro Abs: 3.2 10*3/uL (ref 1.7–7.7)
Neutrophils Relative %: 48 %
PLATELETS: 108 10*3/uL — AB (ref 150–400)
RBC: 4.36 MIL/uL (ref 4.22–5.81)
RDW: 14.5 % (ref 11.5–15.5)
WBC: 6.5 10*3/uL (ref 4.0–10.5)

## 2017-11-19 LAB — COMPREHENSIVE METABOLIC PANEL
ALBUMIN: 4.1 g/dL (ref 3.5–5.0)
ALT: 18 U/L (ref 17–63)
ANION GAP: 11 (ref 5–15)
AST: 27 U/L (ref 15–41)
Alkaline Phosphatase: 55 U/L (ref 38–126)
BUN: 15 mg/dL (ref 6–20)
CHLORIDE: 102 mmol/L (ref 101–111)
CO2: 24 mmol/L (ref 22–32)
Calcium: 9 mg/dL (ref 8.9–10.3)
Creatinine, Ser: 1.08 mg/dL (ref 0.61–1.24)
GFR calc Af Amer: >60 mL/min (ref >60)
GFR calc non Af Amer: >60 mL/min (ref >60)
GLUCOSE: 85 mg/dL (ref 65–99)
POTASSIUM: 4.3 mmol/L (ref 3.5–5.1)
SODIUM: 137 mmol/L (ref 135–145)
Total Bilirubin: 0.6 mg/dL (ref 0.3–1.2)
Total Protein: 7.1 g/dL (ref 6.5–8.1)

## 2017-11-19 LAB — PSA: Prostatic Specific Antigen: 0.87 ng/mL (ref 0.00–4.00)

## 2017-11-19 MED ORDER — DENOSUMAB 120 MG/1.7ML ~~LOC~~ SOLN
120.0000 mg | Freq: Once | SUBCUTANEOUS | Status: AC
  Administered 2017-11-19: 120 mg via SUBCUTANEOUS
  Filled 2017-11-19: qty 1.7

## 2017-11-19 NOTE — Patient Instructions (Signed)
[facility] at [facility] Discharge Instructions  RECOMMENDATIONS MADE BY THE CONSULTANT AND ANY TEST RESULTS WILL BE SENT TO YOUR REFERRING PHYSICIAN.  Received Xgeva injection today. Follow-up as scheduled. Call clinic for any questions or concerns  Thank you for choosing [facility] at [facility] to provide your oncology and hematology care.  To afford each patient quality time with our provider, please arrive at least 15 minutes before your scheduled appointment time.    If you have a lab appointment with the Cancer Center please come in thru the  Main Entrance and check in at the main information desk  You need to re-schedule your appointment should you arrive 10 or more minutes late.  We strive to give you quality time with our providers, and arriving late affects you and other patients whose appointments are after yours.  Also, if you no show three or more times for appointments you may be dismissed from the clinic at the providers discretion.     Again, thank you for choosing [facility].  Our hope is that these requests will decrease the amount of time that you wait before being seen by our physicians.       _____________________________________________________________  Should you have questions after your visit to [facility], please contact our office at [phone] between the hours of 8:30 a.m. and 4:30 p.m.  Voicemails left after 4:30 p.m. will not be returned until the following business day.  For prescription refill requests, have your pharmacy contact our office.       Resources For Cancer Patients and their Caregivers ? American Cancer Society: Can assist with transportation, wigs, general needs, runs Look Good Feel Better.        1-888-227-6333 ? Cancer Care: Provides financial assistance, online support groups, medication/co-pay assistance.  1-800-813-HOPE (4673) ? [facility] Assists [place] cancer patients and their families through emotional , educational and financial support.  336-427-4357 ? [place] DSS Where to apply for food stamps, Medicaid and utility assistance. 336-342-1394 ? [facility]: Transportation to medical appointments. 336-347-2287 ? Social Security Administration: May apply for disability if have a Stage IV cancer. 336-342-7796 1-800-772-1213 ? [place] Aging, Disability and Transit Services: Assists with nutrition, care and transit needs. 336-349-2343  Cancer Center Support Programs: @10RELATIVEDAYS@ > Cancer Support Group  2nd Tuesday of the month 1pm-2pm, Journey Room  > Creative Journey  3rd Tuesday of the month 1130am-1pm, Journey Room  > Look Good Feel Better  1st Wednesday of the month 10am-12 noon, Journey Room (Call American Cancer Society to register 1-800-395-5775)   

## 2017-11-19 NOTE — Progress Notes (Signed)
[name] tolerated Xgeva injection well without complaints or incident. Calcium 9 today and pt denied any tooth,jaw or leg pain or any recent or future dental appts prior to administering this medication.Pt taking Calcium PO as directed without issues VSS Pt discharged self ambulatory in satisfactory condition

## 2017-11-19 NOTE — Progress Notes (Signed)
HEM/ONC FOLLOW UP NOTE ([name])- 11/19/2017   CHIEF COMPLAINT: Follow-up for prostate cancer with biochemical recurrence. Is currently receiving Depo-Lupron and Xgeva.   Gets Depo-Lupron from Dr. Exie Parody every 3 months And Xgeva injection here once a month.   HISTORY OF PRESENT ILLNESS: Prostate cancer diagnosed in 2010 status post robotic radical prostatectomy at [facility]   Rising PSA noted in June 2018 05/15/2009 Initial Diagnosis  Prostate biopsy by Dr. Michela Pitcher reveals prostate adenocarcinoma  07/09/2009 Surgery  Radical prostatectomy by Dr.Hemal at [facility] reveals T2, N0 Gleason score 7 adenocarcinoma with no extracapsular extension or seminal vesicle invasion and all margins were negative, 2 lymph nodes were negative  05/23/2010 - 06/05/2010 Radiation  61.2 gray administered to the prostatic bed by Dr. Lance Morin for a rising PSA level postoperatively  12/24/2015 Progression  PSA 10.3 with negative bone scan except for T8 traumatic vertebral body collapse but positive CT scan with abdominal lymphadenopathy and a peri-hepatic 2.2 cm nodule adjacent to the dome of the liver  12/27/2015 - Hormone Therapy  Lupron initiated along with one month bicalutamide therapy    06/2017-- MRI of the spine from September 2018 has been reviewed that shows chronic benign appearing T8 compression fracture.  Multilevel mild to moderate neuroforaminal stenosis was noted within the thoracic spine and appeared to be related to degenerative changes.  No suspicion for metastatic disease to bone.  06/2017- A bone scan prior to that showed uptake in the T8-T9 costovertebral junction bilaterally correlating to the area noted on the MRI of chronic compression fracture.    06/2017- A CT chest abdomen pelvis showed no abdominal adenopathy. -A 6 mm lung nodule within the left lingula-on prior scans, this area was atelectatic, so it is not clear if this is  new. -He had localized rectal wall thickening noted incidentally -Bilateral kidney cysts with mild scarring.   INTERVAL HISTORY:He denies any symptoms of change in bowel habits melena hematochezia.  He has no change in his appetite his weight is stable.  His chronic back pain is mild and stable.   CBC today shows hemoglobin 12.4, platelets are 108 down from a normal value of 156 in September 2018.  Serum creatinine function are stable and normal. PSA is at 0.83.  ROS  Physical Exam  Exam shows him to be alert awake oriented x3, no acute distress.  No pallor no icterus no skin rashes were noted. Neck was supple.  No lymph nodes were felt in the neck supraclavicular or axillary areas. Lungs are clear no labored respirations.  Heart rhythm is regular.  No edema extremity in either upper or lower extremities. Abdomen is soft nontender.  No organomegaly. Range of motion of all joints is normal.  No bone tenderness.  ASSESSMENT AND PLAN: Prostate cancer with suspected intrabdominal mets based on a Ct scan report from outside( scanned into the EMR) from 12/2015. It appears that at that time, a 2.2 cm nodule adjacent to liver and small aortocaval lymphadenopathy was noted- max 15 mm. Patient remains asymptomatic other than erectile dysfunction and chronic back pain.  Recent CT 06/2017 does not show any abdominal adenopathy. But several incidental findings were noted as below:  -6 mm Lung nodule needs to be followed as well on the next CT -Abnormal activity noted in the rectum on CT scans needs further investigation-  -B/L kidney cysts - incidentally noted, need to be followed as well on subsequent CTs  Please see order  for a repeat CT C/A/P with contrast to be arranged prior to his next visit for Xgeva ( within 1 month). Will need to assess the status of liver lesion and lung nodule and also look into the rectal wall thickening note don prior CT.   -Last colonoscopy in chart reported in 2012-  hemorrhoids and diverticuli.  I am unsure if he requires Xgeva q 3 months. There is no clear documentation of bone metastasis on multiple bone scans,  CT and MRI  images. The compression fracture at T8 is noted to be traumatic and old and stable. At the very least, after his next imaging studies, Delton See could be stopped, he could continue Prolia q 6 months to prevent bone loss from ADT. He will continue Lupron 22.5 mg subcu every 3 months through Dr. Fredrik Rigger office at the [facility]. Return in 1 month to review scans before Xgeva.    Orders Placed This Encounter  Procedures  . CBC with Differential  . PSA

## 2017-11-23 NOTE — Progress Notes (Signed)
His PSA is rising,please see order for a repeat CT C/A/P with contrast to be arranged prior to his next visit for Xgeva ( within 1 month). Will need to assess the status of liver lesion and lung nodule and also look into the rectal wall thickening note don prior CT. Last colonoscopy in chart reported in 2012- hemorrhoids and diverticuli.

## 2017-12-11 ENCOUNTER — Telehealth (HOSPITAL_COMMUNITY): Payer: Self-pay

## 2017-12-11 NOTE — Telephone Encounter (Signed)
appts for follow up with provider and Xgeva are on 12/18/16 and CT is on 12/14/17.

## 2017-12-11 NOTE — Telephone Encounter (Signed)
-----   Message from Creola Corn, MD sent at 12/10/2017  5:20 PM EST ----- Please have him see MD prior to his next Xgeva injection  And after his Ct scans to review- thanks Before his PSA resulted, I had made a 8 week follow up, but since it is rising, I want him come sooner and CTs to be donE sooner - within a month- thanks

## 2017-12-14 ENCOUNTER — Ambulatory Visit (HOSPITAL_COMMUNITY)
Admission: RE | Admit: 2017-12-14 | Discharge: 2017-12-14 | Disposition: A | Discharge status: Home / Self Care | Payer: Medicare Other | Source: Ambulatory Visit | Attending: Oncology | Admitting: Oncology

## 2017-12-14 DIAGNOSIS — M4854XD Collapsed vertebra, not elsewhere classified, thoracic region, subsequent encounter for fracture with routine healing: Secondary | ICD-10-CM | POA: Insufficient documentation

## 2017-12-14 DIAGNOSIS — X58XXXD Exposure to other specified factors, subsequent encounter: Secondary | ICD-10-CM | POA: Diagnosis not present

## 2017-12-14 DIAGNOSIS — C61 Malignant neoplasm of prostate: Secondary | ICD-10-CM | POA: Insufficient documentation

## 2017-12-14 DIAGNOSIS — Z9079 Acquired absence of other genital organ(s): Secondary | ICD-10-CM | POA: Diagnosis not present

## 2017-12-14 DIAGNOSIS — C7951 Secondary malignant neoplasm of bone: Secondary | ICD-10-CM | POA: Diagnosis not present

## 2017-12-14 DIAGNOSIS — M899 Disorder of bone, unspecified: Secondary | ICD-10-CM | POA: Insufficient documentation

## 2017-12-14 MED ORDER — IOPAMIDOL (ISOVUE-300) INJECTION 61%
100.0000 mL | Freq: Once | INTRAVENOUS | Status: AC | PRN
  Administered 2017-12-14: 100 mL via INTRAVENOUS

## 2017-12-15 ENCOUNTER — Inpatient Hospital Stay (HOSPITAL_COMMUNITY): Payer: Medicare Other | Attending: Oncology

## 2017-12-15 DIAGNOSIS — C7951 Secondary malignant neoplasm of bone: Secondary | ICD-10-CM | POA: Diagnosis not present

## 2017-12-15 DIAGNOSIS — C61 Malignant neoplasm of prostate: Secondary | ICD-10-CM

## 2017-12-15 LAB — COMPREHENSIVE METABOLIC PANEL
ALT: 18 U/L (ref 17–63)
AST: 25 U/L (ref 15–41)
Albumin: 4 g/dL (ref 3.5–5.0)
Alkaline Phosphatase: 55 U/L (ref 38–126)
Anion gap: 9 (ref 5–15)
BUN: 10 mg/dL (ref 6–20)
CHLORIDE: 106 mmol/L (ref 101–111)
CO2: 24 mmol/L (ref 22–32)
Calcium: 9.4 mg/dL (ref 8.9–10.3)
Creatinine, Ser: 0.95 mg/dL (ref 0.61–1.24)
GFR calc Af Amer: >60 mL/min (ref >60)
Glucose, Bld: 73 mg/dL (ref 65–99)
Potassium: 3.9 mmol/L (ref 3.5–5.1)
Sodium: 139 mmol/L (ref 135–145)
Total Bilirubin: 0.4 mg/dL (ref 0.3–1.2)
Total Protein: 6.7 g/dL (ref 6.5–8.1)

## 2017-12-15 LAB — CBC WITH DIFFERENTIAL/PLATELET
Basophils Absolute: 0 10*3/uL (ref 0.0–0.1)
Basophils Relative: 0 %
EOS PCT: 3 %
Eosinophils Absolute: 0.2 10*3/uL (ref 0.0–0.7)
HCT: 37.3 % — ABNORMAL LOW (ref 39.0–52.0)
Hemoglobin: 11.9 g/dL — ABNORMAL LOW (ref 13.0–17.0)
LYMPHS ABS: 2.5 10*3/uL (ref 0.7–4.0)
Lymphocytes Relative: 44 %
MCH: 28 pg (ref 26.0–34.0)
MCHC: 31.9 g/dL (ref 30.0–36.0)
MCV: 87.8 fL (ref 78.0–100.0)
MONOS PCT: 6 %
Monocytes Absolute: 0.3 10*3/uL (ref 0.1–1.0)
Neutro Abs: 2.8 10*3/uL (ref 1.7–7.7)
Neutrophils Relative %: 47 %
PLATELETS: 153 10*3/uL (ref 150–400)
RBC: 4.25 MIL/uL (ref 4.22–5.81)
RDW: 14.6 % (ref 11.5–15.5)
WBC: 5.8 10*3/uL (ref 4.0–10.5)

## 2017-12-15 LAB — PSA: PROSTATIC SPECIFIC ANTIGEN: 0.95 ng/mL (ref 0.00–4.00)

## 2017-12-17 ENCOUNTER — Other Ambulatory Visit (HOSPITAL_COMMUNITY): Payer: Medicare Other

## 2017-12-17 ENCOUNTER — Ambulatory Visit (HOSPITAL_COMMUNITY): Payer: Medicare Other

## 2017-12-18 ENCOUNTER — Other Ambulatory Visit: Payer: Self-pay | Admitting: Family Medicine

## 2017-12-18 ENCOUNTER — Other Ambulatory Visit: Payer: Self-pay

## 2017-12-18 ENCOUNTER — Other Ambulatory Visit (HOSPITAL_COMMUNITY): Payer: Medicare Other

## 2017-12-18 ENCOUNTER — Inpatient Hospital Stay (HOSPITAL_BASED_OUTPATIENT_CLINIC_OR_DEPARTMENT_OTHER): Payer: Medicare Other | Admitting: Oncology

## 2017-12-18 ENCOUNTER — Ambulatory Visit: Payer: Medicare Other | Admitting: Family Medicine

## 2017-12-18 ENCOUNTER — Inpatient Hospital Stay (HOSPITAL_COMMUNITY): Payer: Medicare Other

## 2017-12-18 VITALS — BP 134/67 | HR 65 | Temp 98.5°F | Resp 18 | Wt 281.1 lb

## 2017-12-18 DIAGNOSIS — C61 Malignant neoplasm of prostate: Secondary | ICD-10-CM

## 2017-12-18 DIAGNOSIS — M899 Disorder of bone, unspecified: Secondary | ICD-10-CM | POA: Diagnosis not present

## 2017-12-18 DIAGNOSIS — M545 Low back pain, unspecified: Secondary | ICD-10-CM

## 2017-12-18 DIAGNOSIS — G8929 Other chronic pain: Secondary | ICD-10-CM | POA: Diagnosis not present

## 2017-12-18 DIAGNOSIS — C7951 Secondary malignant neoplasm of bone: Secondary | ICD-10-CM | POA: Diagnosis not present

## 2017-12-18 DIAGNOSIS — R911 Solitary pulmonary nodule: Secondary | ICD-10-CM | POA: Diagnosis not present

## 2017-12-18 MED ORDER — DENOSUMAB 120 MG/1.7ML ~~LOC~~ SOLN
120.0000 mg | Freq: Once | SUBCUTANEOUS | Status: AC
  Administered 2017-12-18: 120 mg via SUBCUTANEOUS
  Filled 2017-12-18: qty 1.7

## 2017-12-18 NOTE — Progress Notes (Signed)
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 Is currently receiving Depo-Lupron and Xgeva.   Gets Depo-Lupron from Dr. Exie Parody every 3 months And Xgeva injection here once a month.   HISTORY OF PRESENT ILLNESS: Prostate cancer diagnosed in 2010 status post robotic radical prostatectomy at [facility]   Rising PSA noted in June 2018 05/15/2009 Initial Diagnosis  Prostate biopsy by Dr. Michela Pitcher reveals prostate adenocarcinoma  07/09/2009 Surgery  Radical prostatectomy by Dr.Hemal at [facility] reveals T2, N0 Gleason score 7 adenocarcinoma with no extracapsular extension or seminal vesicle invasion and all margins were negative, 2 lymph nodes were negative  05/23/2010 - 06/05/2010 Radiation  61.2 gray administered to the prostatic bed by Dr. Lance Morin for a rising PSA level postoperatively  12/24/2015 Progression  PSA 10.3 with negative bone scan except for T8 traumatic vertebral body collapse but positive CT scan with abdominal lymphadenopathy and a peri-hepatic 2.2 cm nodule adjacent to the dome of the liver  12/27/2015 - Hormone Therapy  Lupron initiated along with one month bicalutamide therapy    06/2017-- MRI of the spine from September 2018 has been reviewed that shows chronic benign appearing T8 compression fracture.  Multilevel mild to moderate neuroforaminal stenosis was noted within the thoracic spine and appeared to be related to degenerative changes.  No suspicion for metastatic disease to bone.  06/2017- A bone scan prior to that showed uptake in the T8-T9 costovertebral junction bilaterally correlating to the area noted on the MRI of chronic compression fracture.    06/2017- A CT chest abdomen pelvis showed no abdominal adenopathy. -A 6 mm lung nodule within the left lingula-on prior scans, this area was atelectatic, so it is not clear if this is  new. -He had localized rectal wall thickening noted incidentally -Bilateral kidney cysts with mild scarring.   INTERVAL HISTORY: Patient presents today for CT scan results and labs. He denies any symptoms of change in bowel, melena or hematochezia. His appetite is 75% and energy levels are 100%. His weight is stable. He has chronic lower back pain that is mild and stable. He denies shortness of breath, chest pain, diarrhea, constipation, urinary complaints or weakness/fatigue. He has occasional swelling in bilateral lower extremities but this is relieved with elevation. He is asking for refill of his pain medication for his chronic back pain. This is normally filled in by his PCP.  Review of Systems  Constitutional: Negative.  Negative for chills, fever, malaise/fatigue and weight loss.  HENT: Negative for congestion and ear pain.   Eyes: Negative.  Negative for blurred vision and double vision.  Respiratory: Negative.  Negative for cough, sputum production and shortness of breath.   Cardiovascular: Positive for leg swelling (Relieved with elevation and rest). Negative for chest pain and palpitations.  Gastrointestinal: Negative.  Negative for abdominal pain, constipation, diarrhea, nausea and vomiting.  Genitourinary: Negative for dysuria, frequency and urgency.  Musculoskeletal: Positive for back pain (Lower back pain- chronic). Negative for falls.  Skin: Negative.  Negative for rash.  Neurological: Negative.  Negative for weakness and headaches.  Endo/Heme/Allergies: Negative.  Does not bruise/bleed easily.  Psychiatric/Behavioral: Negative.  Negative for depression. The patient is not nervous/anxious and does not have insomnia.     Physical Exam  Constitutional: He is oriented to person, place, and time. He appears well-developed and well-nourished.  HENT:  Head: Normocephalic and atraumatic.  Eyes: EOM are normal. Pupils  are equal, round, and reactive to light.  Neck: Normal range of  motion. Neck supple.  Cardiovascular: Normal rate, regular rhythm, normal heart sounds and intact distal pulses.  Pulmonary/Chest: Effort normal and breath sounds normal.  Abdominal: Bowel sounds are normal.  Musculoskeletal: Normal range of motion. He exhibits edema (occasional: Relieved with elevation and rest.).  Neurological: He is alert and oriented to person, place, and time.  Skin: Skin is warm and dry.  Psychiatric: He has a normal mood and affect. His behavior is normal. Judgment and thought content normal.    CBC Latest Ref Rng & Units 12/15/2017 11/19/2017 07/01/2017  WBC 4.0 - 10.5 K/uL 5.8 6.5 6.5  Hemoglobin 13.0 - 17.0 g/dL 11.9(L) 12.4(L) 12.8(L)  Hematocrit 39.0 - 52.0 % 37.3(L) 38.1(L) 37.8(L)  Platelets 150 - 400 K/uL 153 108(L) 156    CMP Latest Ref Rng & Units 12/15/2017 11/19/2017 10/23/2017  Glucose 65 - 99 mg/dL 73 85 88  BUN 6 - 20 mg/dL 10 15 11   Creatinine 0.61 - 1.24 mg/dL 0.95 1.08 1.06  Sodium 135 - 145 mmol/L 139 137 139  Potassium 3.5 - 5.1 mmol/L 3.9 4.3 4.0  Chloride 101 - 111 mmol/L 106 102 103  CO2 22 - 32 mmol/L 24 24 25   Calcium 8.9 - 10.3 mg/dL 9.4 9.0 9.4  Total Protein 6.5 - 8.1 g/dL 6.7 7.1 7.3  Total Bilirubin 0.3 - 1.2 mg/dL 0.4 0.6 0.7  Alkaline Phos 38 - 126 U/L 55 55 57  AST 15 - 41 U/L 25 27 30   ALT 17 - 63 U/L 18 18 20       ASSESSMENT AND PLAN: Per Dr. Annie Paras last note, patient was to have a repeat CT abdomen/pelvis/chest to reexamine incidental findings from previous CT scan on 06/2017. It  identified a 6 mm lung nodule, abnormal activity noted in the rectum and bilateral kidney cyst. There was also mention of a liver lesion on previous CT scan and follow-up CT scan was recommended.   Today's scans shows no evidence of a lesion on liver as mentioned in previous study, normal bowel and stable 2 mm subpleural nodule in the left lower lobe that is unchanged and a 5 mm groundglass nodule in the central left upper lobe that is unchanged. There  is no evidence of rectal wall thickening on this exam. There is stable compression fracture deformity at T8 and mild sclerosis along the left iliac bone that remains unchanged. There was no evidence for recurrent or metastatic disease.  Patient received Xgeva today. Patient likely can be switched from [facility] every 3 to Prolia q 6 months given CT scan did not show metastatic bony disease as mentioned in Dr. Annie Paras previous note. It did show mild sclerosis along the left iliac bone that appeared unchanged. Since patient already received Delton See today, will let MD make that decision at next visit. His compression fracture at T8 has been stable and appears old.  PSA trending up. Today's PSA 0.95. Continue Lupron subcutaneous every 3 months through Dr. Fredrik Rigger office at [facility].  As far as patient's pain medication, will let PCP continue to fill this. Patient is agreeable to this plan.  Dispo: RTC in 3 months with labs/ Xgeva or possible switch to Prolia. Will let MD decide.    Greater than 50% was spent in counseling and coordination of care with this patient including but not limited to discussion of the relevant topics above (See A&P) including, but not limited to diagnosis and management  of acute and chronic medical conditions.    Faythe Casa, NP 12/21/2017 8:55 AM

## 2017-12-18 NOTE — Progress Notes (Signed)
[name] presents today for injection per the provider's orders.  Xgeva administration without incident; see MAR for injection details.  Patient tolerated procedure well and without incident.  No questions or complaints noted at this time.  Discharged ambulatory.  

## 2017-12-21 NOTE — Telephone Encounter (Signed)
Support: [id number]   Javascript must be enabled to view this report.    Gypsy [place]ore, [phone]  Powered by   Narx Report  Resources Date: 12/21/2017 Download CSV  Download PDF  [facility]psy [facility]   Linked Records             Report Criteria         Risk Indicators   NARX SCORES Narcotic 310 Sedative 130 Stimulant 080 Explanation and Guidance OVERDOSE RISK SCORE  140 (Range 000-999) Explanation and Guidance ADDITIONAL RISK INDICATORS ( 0 ) Explanation and Guidance This NarxCare report is based on search criteria supplied and the data entered by the dispensing pharmacy. For more information about any prescription, please contact the dispensing pharmacy or the prescriber. NarxCare scores and reports are intended to aid, not replace, medical decision making. None of the information presented should be used as sole justification for providing or refusing to provide medications. The information on this report is not warranted as accurate or complete. Graphs   RX GRAPH   Narcotic    Sedative   Stimulant   Other   Created with Raphal 2.2.0All Prescribers  Created with Raphal 2.2.0Timeline02/25/[id number] - Jennette Banker Nelson2 - Velvet Bathe A Hasanaj  Morphine MgEq (MME) Created with Raphal [id number] Created with Raphal 2.2.0Timeline02/[id number] *Per CDC guidance, the MME conversion factors prescribed or provided as part of the medication-assisted treatment for opioid use disorder should not be used to benchmark against dosage thresholds meant for opioids prescribed for pain. Buprenorphine products have no agreed upon morphine equivalency, and as partial opioid agonists, are not expected to be associated with overdose risk in the same dose-dependent manner as doses for full agonist opioids. MME = morphine milligram equivalents. LME = Lorazepam milligram equivalents. mg = dose in milligrams.  Drug Details Fill Date Drug Qty Days Prescriber Pharm Refill MgEq MgEq/Day   Close Prescriber Details Name [place]  Close Summary   Summary  Total Prescriptions: 36  Total Prescribers: 2  Total Pharmacies: 2  Narcotics*  (excluding buprenorphine) Current Qty: 0  Current MME/day: 0.00  30 Day Avg MME/day: 6.41  Sedatives*  Current Qty: 0  Current LME/day: 0.00  30 Day Avg LME/day: 0.00  Buprenorphine*  Current Qty: 0  Current mg/day: 0.00  30 Day Avg mg/day: 0.00  Rx Data   PRESCRIPTIONS Total Prescriptions: 36  Total Private Pay: 0  Fill Date ID Written Drug Qty Days Prescriber Rx # Pharmacy Refill Daily Dose * Pymt Type PMP  11/18/2017  1  11/18/2017  Tramadol Hcl 50 Mg Tablet  50 13 Yv Nel  [id number] Mit ([id number])  0 19.23 MME Medicare  [place]  10/28/2017  1  10/28/2017  Tramadol Hcl 50 Mg Tablet  50 13 Yv Nel  [id number] Mit ([id number])  0 19.23 MME Medicare  [place]  10/07/2017  1  10/07/2017  Tramadol Hcl 50 Mg Tablet  30 5 Yv Nel  [id number] Mit ([id number])  0 30.00 MME Medicare  [place]  09/11/2017  1  09/11/2017  Tramadol Hcl 50 Mg Tablet  30 8 Yv Nel  [id number] Mit ([id number])  0 18.75 MME Medicare  [place]  08/14/2017  1  07/13/2017  Tramadol Hcl 50 Mg Tablet  90 23 Yv Nel  [id number] Mit ([id number])  0 19.57 MME Medicare  [place]  07/14/2017  1  07/14/2017  Tramadol Hcl 50 Mg Tablet  90 30 Yv Nel  [id number] Mit ([id number])  0 15.00 MME Medicare  [place]  06/11/2017  1  06/11/2017  Tramadol Hcl 50 Mg Tablet  90 23 Yv Nel  [id number] Mit ([id number])  0 19.57 MME Medicare  [place]  05/11/2017  1  05/08/2017  Tramadol Hcl 50 Mg Tablet  90 23 Yv Nel  [id number] Mit ([id number])  0 19.57 MME Medicare  [place]  04/02/2017  1  04/02/2017  Tramadol Hcl 50 Mg Tablet  90 23 Yv Nel  [id number] Mit ([id number])  0 19.57 MME Medicare  [place]

## 2017-12-28 ENCOUNTER — Other Ambulatory Visit: Payer: Self-pay | Admitting: Family Medicine

## 2017-12-28 DIAGNOSIS — J309 Allergic rhinitis, unspecified: Secondary | ICD-10-CM | POA: Diagnosis not present

## 2017-12-28 DIAGNOSIS — M79669 Pain in unspecified lower leg: Secondary | ICD-10-CM | POA: Diagnosis not present

## 2017-12-28 DIAGNOSIS — M62838 Other muscle spasm: Secondary | ICD-10-CM | POA: Diagnosis not present

## 2017-12-28 DIAGNOSIS — I10 Essential (primary) hypertension: Secondary | ICD-10-CM | POA: Diagnosis not present

## 2018-01-08 ENCOUNTER — Other Ambulatory Visit (HOSPITAL_COMMUNITY): Payer: Self-pay

## 2018-01-08 ENCOUNTER — Other Ambulatory Visit (HOSPITAL_COMMUNITY): Payer: Medicare Other

## 2018-01-08 ENCOUNTER — Inpatient Hospital Stay (HOSPITAL_COMMUNITY): Payer: Medicare Other | Attending: Internal Medicine

## 2018-01-08 DIAGNOSIS — C7951 Secondary malignant neoplasm of bone: Secondary | ICD-10-CM

## 2018-01-08 DIAGNOSIS — I10 Essential (primary) hypertension: Secondary | ICD-10-CM | POA: Diagnosis not present

## 2018-01-08 DIAGNOSIS — M79669 Pain in unspecified lower leg: Secondary | ICD-10-CM | POA: Diagnosis not present

## 2018-01-08 DIAGNOSIS — M62838 Other muscle spasm: Secondary | ICD-10-CM | POA: Diagnosis not present

## 2018-01-08 DIAGNOSIS — C61 Malignant neoplasm of prostate: Secondary | ICD-10-CM | POA: Diagnosis not present

## 2018-01-08 DIAGNOSIS — J309 Allergic rhinitis, unspecified: Secondary | ICD-10-CM | POA: Diagnosis not present

## 2018-01-08 LAB — COMPREHENSIVE METABOLIC PANEL
ALK PHOS: 53 U/L (ref 38–126)
ALT: 27 U/L (ref 17–63)
ANION GAP: 10 (ref 5–15)
AST: 36 U/L (ref 15–41)
Albumin: 3.8 g/dL (ref 3.5–5.0)
BILIRUBIN TOTAL: 0.7 mg/dL (ref 0.3–1.2)
BUN: 12 mg/dL (ref 6–20)
CALCIUM: 9.1 mg/dL (ref 8.9–10.3)
CO2: 25 mmol/L (ref 22–32)
CREATININE: 1.16 mg/dL (ref 0.61–1.24)
Chloride: 102 mmol/L (ref 101–111)
GFR, EST NON AFRICAN AMERICAN: 58 mL/min — AB (ref >60)
Glucose, Bld: 133 mg/dL — ABNORMAL HIGH (ref 65–99)
Potassium: 3.6 mmol/L (ref 3.5–5.1)
Sodium: 137 mmol/L (ref 135–145)
TOTAL PROTEIN: 6.4 g/dL — AB (ref 6.5–8.1)

## 2018-01-08 LAB — CBC WITH DIFFERENTIAL/PLATELET
BASOS ABS: 0 10*3/uL (ref 0.0–0.1)
BASOS PCT: 0 %
EOS ABS: 0.2 10*3/uL (ref 0.0–0.7)
Eosinophils Relative: 3 %
HEMATOCRIT: 35.1 % — AB (ref 39.0–52.0)
HEMOGLOBIN: 11.4 g/dL — AB (ref 13.0–17.0)
Lymphocytes Relative: 35 %
Lymphs Abs: 1.8 10*3/uL (ref 0.7–4.0)
MCH: 28.5 pg (ref 26.0–34.0)
MCHC: 32.5 g/dL (ref 30.0–36.0)
MCV: 87.8 fL (ref 78.0–100.0)
Monocytes Absolute: 0.2 10*3/uL (ref 0.1–1.0)
Monocytes Relative: 4 %
NEUTROS ABS: 2.9 10*3/uL (ref 1.7–7.7)
NEUTROS PCT: 58 %
Platelets: 153 10*3/uL (ref 150–400)
RBC: 4 MIL/uL — ABNORMAL LOW (ref 4.22–5.81)
RDW: 14.5 % (ref 11.5–15.5)
WBC: 5.1 10*3/uL (ref 4.0–10.5)

## 2018-01-08 LAB — PSA: PROSTATIC SPECIFIC ANTIGEN: 1.07 ng/mL (ref 0.00–4.00)

## 2018-01-12 DIAGNOSIS — C61 Malignant neoplasm of prostate: Secondary | ICD-10-CM | POA: Diagnosis not present

## 2018-01-14 ENCOUNTER — Ambulatory Visit (HOSPITAL_COMMUNITY): Payer: Medicare Other

## 2018-01-14 ENCOUNTER — Encounter (HOSPITAL_COMMUNITY): Payer: Self-pay

## 2018-01-14 ENCOUNTER — Other Ambulatory Visit (HOSPITAL_COMMUNITY): Payer: Medicare Other

## 2018-01-14 ENCOUNTER — Inpatient Hospital Stay (HOSPITAL_COMMUNITY): Payer: Medicare Other

## 2018-01-14 ENCOUNTER — Inpatient Hospital Stay (HOSPITAL_COMMUNITY): Payer: Medicare Other | Attending: Internal Medicine

## 2018-01-14 ENCOUNTER — Ambulatory Visit (HOSPITAL_COMMUNITY): Payer: Medicare Other | Admitting: Internal Medicine

## 2018-01-14 VITALS — BP 147/78 | HR 85 | Temp 98.1°F | Resp 18

## 2018-01-14 DIAGNOSIS — C7951 Secondary malignant neoplasm of bone: Secondary | ICD-10-CM | POA: Diagnosis not present

## 2018-01-14 DIAGNOSIS — C61 Malignant neoplasm of prostate: Secondary | ICD-10-CM | POA: Insufficient documentation

## 2018-01-14 MED ORDER — DENOSUMAB 120 MG/1.7ML ~~LOC~~ SOLN
120.0000 mg | Freq: Once | SUBCUTANEOUS | Status: AC
  Administered 2018-01-14: 120 mg via SUBCUTANEOUS
  Filled 2018-01-14: qty 1.7

## 2018-01-14 NOTE — Progress Notes (Signed)
Patient in today for Xgeva shot.  Denied tooth, jaw, or leg pain.  No upcoming dental visits.  Taking calcium as directed.  Patient tolerated injection with no complaints of pain voiced.  Injection site clean and dry with no bruising or swelling noted at site.  Band aid applied. VSS with discharge and left ambulatory with no s/s of distress noted.

## 2018-01-14 NOTE — Patient Instructions (Signed)
[facility] at [facility]  Discharge Instructions: You received an xgeva shot today.  _______________________________________________________________  Thank you for choosing [facility] at [facility] to provide your oncology and hematology care.  To afford each patient quality time with our providers, please arrive at least 15 minutes before your scheduled appointment.  You need to re-schedule your appointment if you arrive 10 or more minutes late.  We strive to give you quality time with our providers, and arriving late affects you and other patients whose appointments are after yours.  Also, if you no show three or more times for appointments you may be dismissed from the clinic.  Again, thank you for choosing [facility] at [facility]. Our hope is that these requests will allow you access to exceptional care and in a timely manner. _______________________________________________________________  If you have questions after your visit, please contact our office at [phone] between the hours of 8:30 a.m. and 5:00 p.m. Voicemails left after 4:30 p.m. will not be returned until the following business day. _______________________________________________________________  For prescription refill requests, have your pharmacy contact our office. _______________________________________________________________  Recommendations made by the consultant and any test results will be sent to your referring physician. _______________________________________________________________ 

## 2018-01-15 DIAGNOSIS — I10 Essential (primary) hypertension: Secondary | ICD-10-CM | POA: Diagnosis not present

## 2018-01-15 DIAGNOSIS — D509 Iron deficiency anemia, unspecified: Secondary | ICD-10-CM | POA: Diagnosis not present

## 2018-01-15 DIAGNOSIS — R7301 Impaired fasting glucose: Secondary | ICD-10-CM | POA: Diagnosis not present

## 2018-01-15 DIAGNOSIS — Z0000 Encounter for general adult medical examination without abnormal findings: Secondary | ICD-10-CM | POA: Diagnosis not present

## 2018-01-15 DIAGNOSIS — E781 Pure hyperglyceridemia: Secondary | ICD-10-CM | POA: Diagnosis not present

## 2018-02-10 ENCOUNTER — Other Ambulatory Visit (HOSPITAL_COMMUNITY): Payer: Self-pay

## 2018-02-10 ENCOUNTER — Ambulatory Visit: Payer: Medicare Other | Admitting: Urology

## 2018-02-10 ENCOUNTER — Other Ambulatory Visit: Payer: Self-pay | Admitting: Urology

## 2018-02-10 DIAGNOSIS — C61 Malignant neoplasm of prostate: Secondary | ICD-10-CM

## 2018-02-10 DIAGNOSIS — C7951 Secondary malignant neoplasm of bone: Secondary | ICD-10-CM

## 2018-02-10 DIAGNOSIS — N3941 Urge incontinence: Secondary | ICD-10-CM

## 2018-02-11 ENCOUNTER — Inpatient Hospital Stay (HOSPITAL_COMMUNITY): Payer: Medicare Other | Attending: Hematology

## 2018-02-11 ENCOUNTER — Inpatient Hospital Stay (HOSPITAL_BASED_OUTPATIENT_CLINIC_OR_DEPARTMENT_OTHER): Payer: Medicare Other | Admitting: Adult Health

## 2018-02-11 ENCOUNTER — Other Ambulatory Visit (HOSPITAL_COMMUNITY): Payer: Medicare Other

## 2018-02-11 ENCOUNTER — Inpatient Hospital Stay (HOSPITAL_COMMUNITY): Payer: Medicare Other

## 2018-02-11 ENCOUNTER — Encounter (HOSPITAL_COMMUNITY): Payer: Self-pay | Admitting: Adult Health

## 2018-02-11 ENCOUNTER — Ambulatory Visit (HOSPITAL_COMMUNITY): Payer: Medicare Other

## 2018-02-11 ENCOUNTER — Other Ambulatory Visit: Payer: Self-pay

## 2018-02-11 VITALS — BP 141/64 | HR 60 | Resp 20 | Ht 69.0 in | Wt 284.4 lb

## 2018-02-11 DIAGNOSIS — R9721 Rising PSA following treatment for malignant neoplasm of prostate: Secondary | ICD-10-CM

## 2018-02-11 DIAGNOSIS — R32 Unspecified urinary incontinence: Secondary | ICD-10-CM | POA: Diagnosis not present

## 2018-02-11 DIAGNOSIS — M899 Disorder of bone, unspecified: Secondary | ICD-10-CM | POA: Diagnosis not present

## 2018-02-11 DIAGNOSIS — C61 Malignant neoplasm of prostate: Secondary | ICD-10-CM

## 2018-02-11 DIAGNOSIS — C7951 Secondary malignant neoplasm of bone: Secondary | ICD-10-CM

## 2018-02-11 DIAGNOSIS — Z87891 Personal history of nicotine dependence: Secondary | ICD-10-CM | POA: Diagnosis not present

## 2018-02-11 DIAGNOSIS — Z923 Personal history of irradiation: Secondary | ICD-10-CM

## 2018-02-11 DIAGNOSIS — M4854XA Collapsed vertebra, not elsewhere classified, thoracic region, initial encounter for fracture: Secondary | ICD-10-CM

## 2018-02-11 DIAGNOSIS — I10 Essential (primary) hypertension: Secondary | ICD-10-CM

## 2018-02-11 DIAGNOSIS — M549 Dorsalgia, unspecified: Secondary | ICD-10-CM | POA: Diagnosis not present

## 2018-02-11 LAB — CBC WITH DIFFERENTIAL/PLATELET
Basophils Absolute: 0 10*3/uL (ref 0.0–0.1)
Basophils Relative: 0 %
EOS ABS: 0.2 10*3/uL (ref 0.0–0.7)
Eosinophils Relative: 4 %
HEMATOCRIT: 36.9 % — AB (ref 39.0–52.0)
HEMOGLOBIN: 11.9 g/dL — AB (ref 13.0–17.0)
LYMPHS ABS: 2 10*3/uL (ref 0.7–4.0)
Lymphocytes Relative: 39 %
MCH: 28 pg (ref 26.0–34.0)
MCHC: 32.2 g/dL (ref 30.0–36.0)
MCV: 86.8 fL (ref 78.0–100.0)
MONO ABS: 0.3 10*3/uL (ref 0.1–1.0)
MONOS PCT: 6 %
NEUTROS PCT: 51 %
Neutro Abs: 2.6 10*3/uL (ref 1.7–7.7)
Platelets: 180 10*3/uL (ref 150–400)
RBC: 4.25 MIL/uL (ref 4.22–5.81)
RDW: 14.7 % (ref 11.5–15.5)
WBC: 5.2 10*3/uL (ref 4.0–10.5)

## 2018-02-11 LAB — COMPREHENSIVE METABOLIC PANEL
ALK PHOS: 57 U/L (ref 38–126)
ALT: 20 U/L (ref 17–63)
ANION GAP: 11 (ref 5–15)
AST: 32 U/L (ref 15–41)
Albumin: 4 g/dL (ref 3.5–5.0)
BILIRUBIN TOTAL: 0.5 mg/dL (ref 0.3–1.2)
BUN: 8 mg/dL (ref 6–20)
CALCIUM: 9.2 mg/dL (ref 8.9–10.3)
CO2: 25 mmol/L (ref 22–32)
Chloride: 103 mmol/L (ref 101–111)
Creatinine, Ser: 1.09 mg/dL (ref 0.61–1.24)
GFR calc non Af Amer: >60 mL/min (ref >60)
Glucose, Bld: 108 mg/dL — ABNORMAL HIGH (ref 65–99)
POTASSIUM: 4.1 mmol/L (ref 3.5–5.1)
SODIUM: 139 mmol/L (ref 135–145)
TOTAL PROTEIN: 6.9 g/dL (ref 6.5–8.1)

## 2018-02-11 LAB — PSA: Prostatic Specific Antigen: 2.01 ng/mL (ref 0.00–4.00)

## 2018-02-11 MED ORDER — DENOSUMAB 120 MG/1.7ML ~~LOC~~ SOLN
120.0000 mg | Freq: Once | SUBCUTANEOUS | Status: AC
  Administered 2018-02-11: 120 mg via SUBCUTANEOUS
  Filled 2018-02-11: qty 1.7

## 2018-02-11 NOTE — Progress Notes (Signed)
[name] tolerated Xgeva injection well without complaints or incident. Calcium 9.2 today and pt denied any tooth or jaw pain and no recent or future dental visits. VSS Pt discharged self ambulatory in satisfactory condition

## 2018-02-11 NOTE — Progress Notes (Signed)
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   CURRENT THERAPY: Depo-Lupron (with [facility]) AND Delton See monthly     BRIEF ONCOLOGIC HISTORY:  (From Dr. Donald Pore last note on 09/03/16)      INTERVAL HISTORY:  [name] 77 y.o. male who presents for continued follow-up for metastatic prostate cancer with bone mets.    Pt here ~4 hours early for his scheduled appointment today; he is worked in since he arrived so early.  Here today unaccompanied.  Overall, he tells me that he has been feeling feeling "fair."  Appetite 100%; energy level 75%.  He reports occasional "cold chills", which are worse in the morning whenever he wakes up.  These chills improve "once I get up and moving."  Denies any fevers that he is aware of.  He has seasonal allergies, but tells me that he was told to stop "my hayfever pill."  He is concerned that "my penis is drawed up. Do you think it's the medicines causing that?"  Denies any dysuria, hematuria, nocturia.  Does endorse urinary dribbling at times, "but he just gave me a pill for that yesterday."  (I suspect he is referring to his urologist).  He denies any hot flashes or focal bone pain.  Does endorse right low back/hip pain "when I bend over and and working a lot."  Also endorses occasional pain to the left axilla "that goes down the side of my body into my groin."  This pain is worse when he is working in his garden.  Denies pain at rest.  Denies any cough or shortness of breath.  Denies dizziness or headaches.  Does endorse occasional itching to his head.  Endorses easy bruising.  Of note, patient is hard of hearing.  He is also a poor historian and has difficulty remembering details of his medical history and treatments.  Due for  next dose of Xgeva today.    REVIEW OF SYSTEMS:  Review of Systems  Constitutional: Positive for fatigue (occasional).  HENT:  Negative.   Eyes: Negative.   Respiratory: Negative.  Negative for cough and shortness of breath.   Cardiovascular: Negative.   Gastrointestinal: Negative.  Negative for abdominal pain, constipation, diarrhea, nausea and vomiting.  Endocrine: Negative.  Negative for hot flashes.  Genitourinary: Positive for bladder incontinence. Negative for dysuria, hematuria, nocturia and pelvic pain.   Musculoskeletal: Positive for back pain.  Skin: Negative.  Negative for rash.  Neurological: Negative.  Negative for dizziness and headaches.  Hematological: Bruises/bleeds easily.  Psychiatric/Behavioral: Negative.      PAST MEDICAL/SURGICAL HISTORY:  Past Medical History:  Diagnosis Date  . Cancer [facility])    prostate  . GERD (gastroesophageal reflux disease)   . Hyperlipidemia   . Hypertension   . Prostate cancer metastatic to bone ([place]) 04/04/2016  . Sleep apnea 10/24/2016   Past Surgical History:  Procedure Laterality Date  . CHOLECYSTECTOMY    . HAND SURGERY    . INGUINAL HERNIA REPAIR Left   . PROSTATECTOMY    . PROSTATECTOMY    . radical prostatectomy    . TONSILLECTOMY       SOCIAL HISTORY:  Social History   Socioeconomic History  . Marital status: Widowed    Spouse name: Not on file  .  Number of children: 3  . Years of education: 9  . Highest education level: Not on file  Occupational History  . Occupation: Animator- retired    Comment: [employer]  Social Needs  . Financial resource strain: Not on file  . Food insecurity:    Worry: Not on file    Inability: Not on file  . Transportation needs:    Medical: Not on file    Non-medical: Not on file  Tobacco Use  . Smoking status: Former Smoker    Packs/day: 1.00    Years: 32.00    Pack years: 32.00    Types: Cigarettes    Last attempt to quit: 02/04/1989    Years since quitting:  29.0  . Smokeless tobacco: Never Used  Substance and Sexual Activity  . Alcohol use: No  . Drug use: No  . Sexual activity: Not Currently  Lifestyle  . Physical activity:    Days per week: Not on file    Minutes per session: Not on file  . Stress: Not on file  Relationships  . Social connections:    Talks on phone: Not on file    Gets together: Not on file    Attends religious service: Not on file    Active member of club or organization: Not on file    Attends meetings of clubs or organizations: Not on file    Relationship status: Not on file  . Intimate partner violence:    Fear of current or ex partner: Not on file    Emotionally abused: Not on file    Physically abused: Not on file    Forced sexual activity: Not on file  Other Topics Concern  . Not on file  Social History Narrative   Widowed   Sarah Ann with son   Chronic low back pain    FAMILY HISTORY:  Family History  Problem Relation Age of Onset  . Heart failure Mother        CHF  . Prostate cancer Father   . Narcolepsy Sister   . Cancer Daughter     CURRENT MEDICATIONS:  Outpatient Encounter Medications as of 02/11/2018  Medication Sig  . benazepril (LOTENSIN) 20 MG tablet TAKE ONE TABLET BY MOUTH DAILY.  . calcium-vitamin D (OSCAL WITH D) 500-200 MG-UNIT TABS tablet Take 1 tablet by mouth daily.  . cetirizine (ZYRTEC) 10 MG tablet TAKE ONE TABLET BY MOUTH DAILY.  . clotrimazole-betamethasone (LOTRISONE) cream Apply 1 application topically 2 (two) times daily.  . hydrochlorothiazide (HYDRODIURIL) 25 MG tablet TAKE ONE TABLET BY MOUTH DAILY.  . meloxicam (MOBIC) 7.5 MG tablet Take 1 tablet (7.5 mg total) daily by mouth.  . omega-3 acid ethyl esters (LOVAZA) 1 g capsule Take 2 g by mouth 2 (two) times daily.  Marland Kitchen omeprazole (PRILOSEC) 40 MG capsule TAKE ONE CAPSULE BY MOUTH DAILY  . Potassium Chloride ER 20 MEQ TBCR Take 1 tablet 2 (two) times daily by mouth.  . selenium sulfide (SELSUN) 2.5 % shampoo Apply 1  application topically daily as needed for irritation.  . traMADol (ULTRAM) 50 MG tablet TAKE ONE TABLET BY MOUTH EVERY 6 HOURS AS NEEDED FOR PAIN  . [DISCONTINUED] potassium chloride SA (K-DUR,KLOR-CON) 20 MEQ tablet Take 20 mEq by mouth daily.  . [DISCONTINUED] tiZANidine (ZANAFLEX) 4 MG tablet TAKE ONE TABLET BY MOUTH TWICE DAILY   No facility-administered encounter medications on file as of 02/11/2018.     ALLERGIES:  No Known Allergies   PHYSICAL EXAM:  ECOG Performance status: 1 - Symptomatic, but independent.   Vitals:   02/11/18 0958  BP: (!) 141/64  Pulse: 60  Resp: 20  SpO2: 100%   Filed Weights   02/11/18 0958  Weight: 284 lb 6.4 oz (129 kg)    Physical Exam  Constitutional: He is oriented to person, place, and time and well-developed, well-nourished, and in no distress.  Guarded assistance to get onto/off of exam table   HENT:  Head: Normocephalic.  Mouth/Throat: Oropharynx is clear and moist. No oropharyngeal exudate.  Eyes: Pupils are equal, round, and reactive to light. Conjunctivae are normal. No scleral icterus.  Neck: Normal range of motion. Neck supple.  Cardiovascular: Normal rate and regular rhythm.  Pulmonary/Chest: Effort normal and breath sounds normal. No respiratory distress. He has no wheezes.  Abdominal: Soft. Bowel sounds are normal. There is no tenderness.  Musculoskeletal: Normal range of motion. He exhibits edema (trace BLE/ankle edema).  Lymphadenopathy:    He has no cervical adenopathy.       Right: No supraclavicular adenopathy present.       Left: No supraclavicular adenopathy present.  Neurological: He is alert and oriented to person, place, and time. No cranial nerve deficit.  Skin: Skin is warm and dry. No rash noted.  Psychiatric: Mood, memory, affect and judgment normal.  Nursing note and vitals reviewed.    LABORATORY DATA:  I have reviewed the labs as listed.  CBC    Component Value Date/Time   WBC 5.2 02/11/2018 0853    RBC 4.25 02/11/2018 0853   HGB 11.9 (L) 02/11/2018 0853   HCT 36.9 (L) 02/11/2018 0853   PLT 180 02/11/2018 0853   MCV 86.8 02/11/2018 0853   MCH 28.0 02/11/2018 0853   MCHC 32.2 02/11/2018 0853   RDW 14.7 02/11/2018 0853   LYMPHSABS 2.0 02/11/2018 0853   MONOABS 0.3 02/11/2018 0853   EOSABS 0.2 02/11/2018 0853   BASOSABS 0.0 02/11/2018 0853   CMP Latest Ref Rng & Units 02/11/2018 01/08/2018 12/15/2017  Glucose 65 - 99 mg/dL 108(H) 133(H) 73  BUN 6 - 20 mg/dL 8 12 10   Creatinine 0.61 - 1.24 mg/dL 1.09 1.16 0.95  Sodium 135 - 145 mmol/L 139 137 139  Potassium 3.5 - 5.1 mmol/L 4.1 3.6 3.9  Chloride 101 - 111 mmol/L 103 102 106  CO2 22 - 32 mmol/L 25 25 24   Calcium 8.9 - 10.3 mg/dL 9.2 9.1 9.4  Total Protein 6.5 - 8.1 g/dL 6.9 6.4(L) 6.7  Total Bilirubin 0.3 - 1.2 mg/dL 0.5 0.7 0.4  Alkaline Phos 38 - 126 U/L 57 53 55  AST 15 - 41 U/L 32 36 25  ALT 17 - 63 U/L 20 27 18          PENDING LABS:    DIAGNOSTIC IMAGING:  *I have reviewed the following diagnostic imaging and agree with below radiologic findings as listed.  CT chest/abd/pelvis: 12/14/17  CLINICAL DATA:  Prostate cancer  EXAM: CT CHEST, ABDOMEN, AND PELVIS WITH CONTRAST  TECHNIQUE: Multidetector CT imaging of the chest, abdomen and pelvis was performed following the standard protocol during bolus administration of intravenous contrast.  CONTRAST:  171mL ISOVUE-300 IOPAMIDOL (ISOVUE-300) INJECTION 61%  COMPARISON:  None.  FINDINGS: CT CHEST FINDINGS  Cardiovascular: The heart is normal in size. No pericardial effusion.  No evidence of thoracic aortic aneurysm. Mild atherosclerotic calcifications of the aortic arch.  Mediastinum/Nodes: No suspicious mediastinal lymphadenopathy.  Visualized thyroid is unremarkable.  Lungs/Pleura: 2 mm subpleural nodule in the  left lower lobe (series 3/image 58), unchanged, benign.  5 mm ground-glass nodule in the central left upper lobe (series 3/image  66), unchanged.  No focal consolidation.  No pleural effusion or pneumothorax.  Musculoskeletal: Stable severe compression fracture deformity at T8.  Degenerative changes of the lower thoracic spine.  CT ABDOMEN PELVIS FINDINGS  Hepatobiliary: Liver is within normal limits.  Status post cholecystectomy. No intrahepatic or extrahepatic ductal dilatation.  Pancreas: Within normal limits.  Spleen: Within normal limits.  Adrenals/Urinary Tract: Adrenal glands are within normal limits.  Bilateral renal cysts, measuring up to 3.1 cm in the medial right lower kidney (series 2/image 34). No hydronephrosis.  Bladder is within normal limits.  Stomach/Bowel: Stomach is within normal limits.  No evidence of bowel obstruction.  Appendix is not discretely visualized.  Mild left colonic stool burden.  Vascular/Lymphatic: No evidence of abdominal aortic aneurysm.  Mild atherosclerotic calcifications of the abdominal aorta.  No suspicious pelvic lymphadenopathy.  Reproductive: Status post prostatectomy.  Other: No abdominopelvic ascites.  Musculoskeletal: Mild degenerative changes of the lumbar spine.  Mild sclerosis along the left iliac bone (series 2/image 85), nonspecific but unchanged, possibly reflecting a benign bone island.  IMPRESSION: Status post prostatectomy.  No findings specific for recurrent or metastatic disease.  Stable compression fracture deformity at T8.  Mild sclerosis along the left iliac bone, unchanged. Attention on follow-up is suggested.   Electronically Signed   By: Julian Hy M.D.   On: 12/14/2017 14:47      PATHOLOGY:        ASSESSMENT & PLAN:   Stage IV adenocarcinoma of prostate with bone mets:  -Previously treated with prostatectomy and salvage radiation therapy. Last CT chest/abd/pelvis on 12/14/17 showed no findings suspicious for recurrent or metastatic disease; stable compression  fracture deformity at T8, as well as mild sclerosis along the left iliac bone, which is unchanged and attention on follow-up suggested.  He is scheduled for whole-body bone scan on 02/23/18, which has been ordered by his urologist, Dr. Alyson Ingles. -PSA continues to rise, with trends noted below.   -Continue ADT with urology as directed.  -Return to cancer center in ~1 month to follow-up with Dr. Delton Coombes and determine additional treatment planning, if indicated, based on upcoming bone scan results.     High-risk of bone mets:  -Labs reviewed and calcium adequate for Xgeva injection today.  -Chart reviewed and there is mention from Dr. Sherrine Maples ([facility] oncologist who saw patient in 10/2017) to consider switching patient from [place] to [place].  However, given that his bone scan is pending and he is at high-risk for bony metastatic disease and skeletal related events as a result, will continue Xgeva for now.  Will defer to Dr. Delton Coombes at next visit if he feels it is appropriate to continue monthly Xgeva at that time.       Dispo:  -Return to cancer center in ~1 month for follow-up with Dr. Delton Coombes with labs to discuss further plan of care.      All questions were answered to patient's stated satisfaction. Encouraged patient to call with any new concerns or questions before his next visit to the cancer center and we can certain see him sooner, if needed.       Orders placed this encounter:  Orders Placed This Encounter  Procedures  . CBC with Differential/Platelet  . Comprehensive metabolic panel  . PSA      [name] Craze, NP [place] [phone]

## 2018-02-11 NOTE — Patient Instructions (Signed)
[facility] at [facility] Discharge Instructions  Received Xgeva injection today. Follow-up as scheduled. Call clinic for any questions or concerns   Thank you for choosing [facility] at [facility] to provide your oncology and hematology care.  To afford each patient quality time with our provider, please arrive at least 15 minutes before your scheduled appointment time.   If you have a lab appointment with the Cancer Center please come in thru the  Main Entrance and check in at the main information desk  You need to re-schedule your appointment should you arrive 10 or more minutes late.  We strive to give you quality time with our providers, and arriving late affects you and other patients whose appointments are after yours.  Also, if you no show three or more times for appointments you may be dismissed from the clinic at the providers discretion.     Again, thank you for choosing [facility].  Our hope is that these requests will decrease the amount of time that you wait before being seen by our physicians.       _____________________________________________________________  Should you have questions after your visit to [facility], please contact our office at [phone] between the hours of 8:30 a.m. and 4:30 p.m.  Voicemails left after 4:30 p.m. will not be returned until the following business day.  For prescription refill requests, have your pharmacy contact our office.       Resources For Cancer Patients and their Caregivers ? American Cancer Society: Can assist with transportation, wigs, general needs, runs Look Good Feel Better.        1-888-227-6333 ? Cancer Care: Provides financial assistance, online support groups, medication/co-pay assistance.  1-800-813-HOPE (4673) ? [facility] Assists [place] cancer patients and their families through emotional , educational and  financial support.  336-427-4357 ? [place] DSS Where to apply for food stamps, Medicaid and utility assistance. 336-342-1394 ? [facility]: Transportation to medical appointments. 336-347-2287 ? Social Security Administration: May apply for disability if have a Stage IV cancer. 336-342-7796 1-800-772-1213 ? [place] Aging, Disability and Transit Services: Assists with nutrition, care and transit needs. 336-349-2343  Cancer Center Support Programs:   > Cancer Support Group  2nd Tuesday of the month 1pm-2pm, Journey Room   > Creative Journey  3rd Tuesday of the month 1130am-1pm, Journey Room    

## 2018-02-23 ENCOUNTER — Ambulatory Visit (HOSPITAL_COMMUNITY): Payer: Medicare Other

## 2018-02-26 ENCOUNTER — Encounter (HOSPITAL_COMMUNITY): Payer: Medicare Other

## 2018-03-04 ENCOUNTER — Other Ambulatory Visit: Payer: Self-pay | Admitting: Urology

## 2018-03-04 DIAGNOSIS — C61 Malignant neoplasm of prostate: Secondary | ICD-10-CM

## 2018-03-05 DIAGNOSIS — I10 Essential (primary) hypertension: Secondary | ICD-10-CM | POA: Diagnosis not present

## 2018-03-05 DIAGNOSIS — E782 Mixed hyperlipidemia: Secondary | ICD-10-CM | POA: Diagnosis not present

## 2018-03-05 DIAGNOSIS — R7301 Impaired fasting glucose: Secondary | ICD-10-CM | POA: Diagnosis not present

## 2018-03-05 DIAGNOSIS — D649 Anemia, unspecified: Secondary | ICD-10-CM | POA: Diagnosis not present

## 2018-03-09 ENCOUNTER — Encounter (HOSPITAL_COMMUNITY)
Admission: RE | Admit: 2018-03-09 | Discharge: 2018-03-09 | Disposition: A | Discharge status: Home / Self Care | Payer: Medicare Other | Source: Ambulatory Visit | Attending: Urology | Admitting: Urology

## 2018-03-09 DIAGNOSIS — C61 Malignant neoplasm of prostate: Secondary | ICD-10-CM | POA: Diagnosis not present

## 2018-03-09 MED ORDER — TECHNETIUM TC 99M MEDRONATE IV KIT
20.0000 | PACK | Freq: Once | INTRAVENOUS | Status: AC | PRN
  Administered 2018-03-09: 22 via INTRAVENOUS

## 2018-03-15 ENCOUNTER — Other Ambulatory Visit (HOSPITAL_COMMUNITY): Payer: Medicare Other

## 2018-03-15 ENCOUNTER — Ambulatory Visit (HOSPITAL_COMMUNITY): Payer: Medicare Other

## 2018-03-15 ENCOUNTER — Ambulatory Visit (HOSPITAL_COMMUNITY): Payer: Medicare Other | Admitting: Internal Medicine

## 2018-03-16 ENCOUNTER — Inpatient Hospital Stay (HOSPITAL_COMMUNITY): Payer: Medicare Other | Attending: Adult Health

## 2018-03-16 DIAGNOSIS — R9721 Rising PSA following treatment for malignant neoplasm of prostate: Secondary | ICD-10-CM

## 2018-03-16 DIAGNOSIS — C61 Malignant neoplasm of prostate: Secondary | ICD-10-CM | POA: Insufficient documentation

## 2018-03-16 DIAGNOSIS — C7951 Secondary malignant neoplasm of bone: Secondary | ICD-10-CM

## 2018-03-16 LAB — COMPREHENSIVE METABOLIC PANEL
ALBUMIN: 4 g/dL (ref 3.5–5.0)
ALK PHOS: 61 U/L (ref 38–126)
ALT: 21 U/L (ref 17–63)
AST: 29 U/L (ref 15–41)
Anion gap: 9 (ref 5–15)
BUN: 10 mg/dL (ref 6–20)
CALCIUM: 9 mg/dL (ref 8.9–10.3)
CO2: 26 mmol/L (ref 22–32)
CREATININE: 1.08 mg/dL (ref 0.61–1.24)
Chloride: 104 mmol/L (ref 101–111)
GFR calc non Af Amer: >60 mL/min (ref >60)
Glucose, Bld: 110 mg/dL — ABNORMAL HIGH (ref 65–99)
Potassium: 3.5 mmol/L (ref 3.5–5.1)
SODIUM: 139 mmol/L (ref 135–145)
Total Bilirubin: 0.6 mg/dL (ref 0.3–1.2)
Total Protein: 6.8 g/dL (ref 6.5–8.1)

## 2018-03-16 LAB — CBC WITH DIFFERENTIAL/PLATELET
BASOS ABS: 0 10*3/uL (ref 0.0–0.1)
Basophils Relative: 0 %
EOS ABS: 0.2 10*3/uL (ref 0.0–0.7)
EOS PCT: 4 %
HCT: 35.9 % — ABNORMAL LOW (ref 39.0–52.0)
HEMOGLOBIN: 11.9 g/dL — AB (ref 13.0–17.0)
LYMPHS PCT: 38 %
Lymphs Abs: 2.3 10*3/uL (ref 0.7–4.0)
MCH: 28.7 pg (ref 26.0–34.0)
MCHC: 33.1 g/dL (ref 30.0–36.0)
MCV: 86.7 fL (ref 78.0–100.0)
Monocytes Absolute: 0.4 10*3/uL (ref 0.1–1.0)
Monocytes Relative: 7 %
NEUTROS PCT: 51 %
Neutro Abs: 3.1 10*3/uL (ref 1.7–7.7)
PLATELETS: 179 10*3/uL (ref 150–400)
RBC: 4.14 MIL/uL — AB (ref 4.22–5.81)
RDW: 14.7 % (ref 11.5–15.5)
WBC: 6.1 10*3/uL (ref 4.0–10.5)

## 2018-03-16 LAB — PSA: PROSTATIC SPECIFIC ANTIGEN: 3.28 ng/mL (ref 0.00–4.00)

## 2018-03-17 ENCOUNTER — Ambulatory Visit (HOSPITAL_COMMUNITY)
Admission: RE | Admit: 2018-03-17 | Discharge: 2018-03-17 | Disposition: A | Discharge status: Home / Self Care | Payer: Medicare Other | Source: Ambulatory Visit | Attending: Internal Medicine | Admitting: Internal Medicine

## 2018-03-17 ENCOUNTER — Inpatient Hospital Stay (HOSPITAL_COMMUNITY): Payer: Medicare Other

## 2018-03-17 ENCOUNTER — Inpatient Hospital Stay (HOSPITAL_BASED_OUTPATIENT_CLINIC_OR_DEPARTMENT_OTHER): Payer: Medicare Other | Admitting: Internal Medicine

## 2018-03-17 ENCOUNTER — Other Ambulatory Visit: Payer: Self-pay

## 2018-03-17 VITALS — BP 113/64 | HR 70 | Temp 98.5°F | Resp 22 | Ht 69.0 in | Wt 286.6 lb

## 2018-03-17 DIAGNOSIS — C7951 Secondary malignant neoplasm of bone: Secondary | ICD-10-CM

## 2018-03-17 DIAGNOSIS — R9721 Rising PSA following treatment for malignant neoplasm of prostate: Secondary | ICD-10-CM | POA: Diagnosis not present

## 2018-03-17 DIAGNOSIS — I10 Essential (primary) hypertension: Secondary | ICD-10-CM | POA: Diagnosis not present

## 2018-03-17 DIAGNOSIS — C61 Malignant neoplasm of prostate: Secondary | ICD-10-CM | POA: Insufficient documentation

## 2018-03-17 DIAGNOSIS — Z87891 Personal history of nicotine dependence: Secondary | ICD-10-CM

## 2018-03-17 DIAGNOSIS — M79601 Pain in right arm: Secondary | ICD-10-CM

## 2018-03-17 DIAGNOSIS — M79621 Pain in right upper arm: Secondary | ICD-10-CM | POA: Diagnosis not present

## 2018-03-17 MED ORDER — DENOSUMAB 120 MG/1.7ML ~~LOC~~ SOLN
120.0000 mg | Freq: Once | SUBCUTANEOUS | Status: AC
  Administered 2018-03-17: 120 mg via SUBCUTANEOUS
  Filled 2018-03-17: qty 1.7

## 2018-03-17 NOTE — Patient Instructions (Signed)
[facility] at [facility]  Discharge Instructions:   Today you saw Dr. Higgs.    _______________________________________________________________  Thank you for choosing [facility] at [facility] to provide your oncology and hematology care.  To afford each patient quality time with our providers, please arrive at least 15 minutes before your scheduled appointment.  You need to re-schedule your appointment if you arrive 10 or more minutes late.  We strive to give you quality time with our providers, and arriving late affects you and other patients whose appointments are after yours.  Also, if you no show three or more times for appointments you may be dismissed from the clinic.  Again, thank you for choosing [facility]. Our hope is that these requests will allow you access to exceptional care and in a timely manner. _______________________________________________________________  If you have questions after your visit, please contact our office at [phone] between the hours of 8:30 a.m. and 5:00 p.m. Voicemails left after 4:30 p.m. will not be returned until the following business day. _______________________________________________________________  For prescription refill requests, have your pharmacy contact our office. _______________________________________________________________  Recommendations made by the consultant and any test results will be sent to your referring physician. _______________________________________________________________ 

## 2018-03-17 NOTE — Progress Notes (Signed)
Patient for xgeva shot today.  Taking calcium as directed.  Denied tooth, jaw, and leg pain.  No recent or future dental visits.  Tolerated shot with no complaints voiced.  Injection site clean and dry with no bruising or swelling noted at site.  Band aid applied.  VSS with discharge and left ambulatory with no s/s of distress noted.

## 2018-03-17 NOTE — Progress Notes (Signed)
Diagnosis Prostate cancer metastatic to bone ([place]) - Plan: DG Humerus Right, CBC with Differential/Platelet, Comprehensive metabolic panel, Lactate dehydrogenase, PSA, Protein electrophoresis, serum  Rising PSA following treatment for malignant neoplasm of prostate - Plan: DG Humerus Right, CBC with Differential/Platelet, Comprehensive metabolic panel, Lactate dehydrogenase, PSA, Protein electrophoresis, serum  Staging Cancer Staging No matching staging information was found for the patient.    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   CURRENT THERAPY: Depo-Lupron (with [facility]) AND [place] See monthly   Assessment and Plan:1.  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  Pt was previously treated with prostatectomy and salvage radiation therapy. CT chest/abd/pelvis done 12/14/2017 showed  IMPRESSION: Status post prostatectomy.  No findings specific for recurrent or metastatic disease.  Stable compression fracture deformity at T8.  Mild sclerosis along the left iliac bone, unchanged. Attention on follow-up is suggested.  He had bone scan done 03/09/2018 that showed  IMPRESSION: Scattered degenerative type uptake with again identified mild uptake at the T8 vertebral body, corresponding to a chronic T8 compression fracture by CT.  Questionable abnormal uptake versus artifact at the RIGHT humeral diaphysis; dedicated RIGHT humeral radiographs recommended.  No other significant scintigraphic abnormalities identified.  Pt will have xray of right humerus done today.  I discussed with him PSA continues to rise and labs done on 03/16/2018 showed a PSA of 3.28.  It had previously been 2 in April 2019.  It is unclear if patient has been compliant with his Lupron injections.  Will contact urology to determine when last injection was done.  Labs reviewed with patient that were done 03/16/2018 and showed a white count 6.1  hemoglobin 11.9 platelets 179,000.  Creatinine is 1.08.  He will receive Xgeva today.  He will return to clinic in 4 weeks for follow-up prior to his next Xgeva injection to go over x-rays and review urology records to determine if he has been receiving Lupron as recommended.  2.  Bone mets.  Patient is here today for Xgeva.  Creatinine 1.08.  Calcium level is normal.  He will proceed with Xgeva as directed.   Bone scan done 03/09/2018 that showed:  IMPRESSION: Scattered degenerative type uptake with again identified mild uptake at the T8 vertebral body, corresponding to a chronic T8 compression fracture by CT.  Questionable abnormal uptake versus artifact at the RIGHT humeral diaphysis; dedicated RIGHT humeral radiographs recommended.  No other significant scintigraphic abnormalities identified.  Pt will have xray of right humerus done today.  He will return to clinic in 4 weeks prior to his next Xgeva injection.  3.  Hypertension.  Blood pressure is 113/64.  Continue to follow-up with primary care physician.  BRIEF ONCOLOGIC HISTORY:  (From Dr. Donald Pore last note on 09/03/16)    Current Status: Patient is seen today for follow-up.  He is here to go up a bone scan.  When questioned he reports some right arm pain.   Problem List Patient Active Problem List   Diagnosis Date Noted  . Tubular adenoma of colon [D12.6] 12/03/2016  . Morbid obesity ([place]) [E66.01] 10/24/2016  . Essential hypertension [I10] 10/24/2016  . HLD (hyperlipidemia) [E78.5] 10/24/2016  . 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 Chronic back pain [M54.9, G89.29] 10/24/2016  . PUD (peptic ulcer disease) [K27.9] 10/24/2016  . GERD (gastroesophageal reflux disease) [K21.9] 10/24/2016  . Hemorrhoids [K64.9] 10/24/2016  . Sleep apnea [G47.30] 10/24/2016  . Uncontrolled daytime somnolence [  R40.0] 10/24/2016  . HOH (hard of hearing) [H91.90] 10/24/2016  . Testicular discomfort  [N50.819] 05/07/2016  . Prostate cancer metastatic to bone [facility]) [C61, C79.51] 04/04/2016    Past Medical History Past Medical History:  Diagnosis Date  . Cancer [facility])    prostate  . GERD (gastroesophageal reflux disease)   . Hyperlipidemia   . Hypertension   . Prostate cancer metastatic to bone ([place]) 04/04/2016  . Sleep apnea 10/24/2016    Past Surgical History Past Surgical History:  Procedure Laterality Date  . CHOLECYSTECTOMY    . HAND SURGERY    . INGUINAL HERNIA REPAIR Left   . PROSTATECTOMY    . PROSTATECTOMY    . radical prostatectomy    . TONSILLECTOMY      Family History Family History  Problem Relation Age of Onset  . Heart failure Mother        CHF  . Prostate cancer Father   . Narcolepsy Sister   . Cancer Daughter      Social History  reports that he quit smoking about 29 years ago. His smoking use included cigarettes. He has a 32.00 pack-year smoking history. He has never used smokeless tobacco. He reports that he does not drink alcohol or use drugs.  Medications  Current Outpatient Medications:  .  benazepril (LOTENSIN) 20 MG tablet, TAKE ONE TABLET BY MOUTH DAILY., Disp: 90 tablet, Rfl: 3 .  calcium-vitamin D (OSCAL WITH D) 500-200 MG-UNIT TABS tablet, Take 1 tablet by mouth daily., Disp: , Rfl: 3 .  cetirizine (ZYRTEC) 10 MG tablet, TAKE ONE TABLET BY MOUTH DAILY., Disp: 30 tablet, Rfl: 0 .  clotrimazole-betamethasone (LOTRISONE) cream, Apply 1 application topically 2 (two) times daily., Disp: 30 g, Rfl: 0 .  hydrochlorothiazide (HYDRODIURIL) 25 MG tablet, TAKE ONE TABLET BY MOUTH DAILY., Disp: 90 tablet, Rfl: 3 .  meloxicam (MOBIC) 7.5 MG tablet, Take 1 tablet (7.5 mg total) daily by mouth., Disp: 30 tablet, Rfl: 3 .  omega-3 acid ethyl esters (LOVAZA) 1 g capsule, Take 2 g by mouth 2 (two) times daily., Disp: , Rfl:  .  omeprazole (PRILOSEC) 40 MG capsule, TAKE ONE CAPSULE BY MOUTH DAILY, Disp: 30 capsule, Rfl: 1 .  Potassium Chloride ER 20 MEQ  TBCR, Take 1 tablet 2 (two) times daily by mouth., Disp: 60 tablet, Rfl: 5 .  selenium sulfide (SELSUN) 2.5 % shampoo, Apply 1 application topically daily as needed for irritation., Disp: 118 mL, Rfl: 12 .  traMADol (ULTRAM) 50 MG tablet, TAKE ONE TABLET BY MOUTH EVERY 6 HOURS AS NEEDED FOR PAIN, Disp: 50 tablet, Rfl: 0 No current facility-administered medications for this visit.   Facility-Administered Medications Ordered in Other Visits:  .  denosumab (XGEVA) injection 120 mg, 120 mg, Subcutaneous, Once, [place], Manon Hilding, PA-C  Allergies Patient has no known allergies.  Review of Systems Review of Systems - Oncology ROS as per HPI otherwise 12 point ROS is negative.   Physical Exam  Vitals Wt Readings from Last 3 Encounters:  03/17/18 286 lb 9.6 oz (130 kg)  02/11/18 284 lb 6.4 oz (129 kg)  12/18/17 281 lb 1.6 oz (127.5 kg)   Temp Readings from Last 3 Encounters:  03/17/18 98.5 F (36.9 C) (Oral)  01/14/18 98.1 F (36.7 C) (Oral)  12/18/17 98.5 F (36.9 C) (Oral)   BP Readings from Last 3 Encounters:  03/17/18 113/64  02/11/18 (!) 141/64  01/14/18 (!) 147/78   Pulse Readings from Last 3 Encounters:  03/17/18 70  02/11/18 60  01/14/18 85    Constitutional: Well-developed, well-nourished, and in no distress.   HENT: Head: Normocephalic and atraumatic.  Mouth/Throat: No oropharyngeal exudate. Mucosa moist. Eyes: Pupils are equal, round, and reactive to light. Conjunctivae are normal. No scleral icterus.  Neck: Normal range of motion. Neck supple. No JVD present.  Cardiovascular: Normal rate, regular rhythm and normal heart sounds.  Exam reveals no gallop and no friction rub.   No murmur heard. Pulmonary/Chest: Effort normal and breath sounds normal. No respiratory distress. No wheezes.No rales.  Abdominal: Soft. Bowel sounds are normal. No distension. There is no tenderness. There is no guarding.  Musculoskeletal: 1+ LE edema.   Lymphadenopathy: No cervical,  axillary or supraclavicular adenopathy.  Neurological: Alert and oriented to person, place, and time. No cranial nerve deficit. Pt has difficulty hearing.   Skin: Skin is warm and dry. No rash noted. No erythema. No pallor.  Psychiatric: Affect and judgment normal.   Labs Appointment on 03/16/2018  Component Date Value Ref Range Status  . WBC 03/16/2018 6.1  4.0 - 10.5 K/uL Final  . RBC 03/16/2018 4.14* 4.22 - 5.81 MIL/uL Final  . Hemoglobin 03/16/2018 11.9* 13.0 - 17.0 g/dL Final  . HCT 03/16/2018 35.9* 39.0 - 52.0 % Final  . MCV 03/16/2018 86.7  78.0 - 100.0 fL Final  . MCH 03/16/2018 28.7  26.0 - 34.0 pg Final  . MCHC 03/16/2018 33.1  30.0 - 36.0 g/dL Final  . RDW 03/16/2018 14.7  11.5 - 15.5 % Final  . Platelets 03/16/2018 179  150 - 400 K/uL Final  . Neutrophils Relative % 03/16/2018 51  % Final  . Neutro Abs 03/16/2018 3.1  1.7 - 7.7 K/uL Final  . Lymphocytes Relative 03/16/2018 38  % Final  . Lymphs Abs 03/16/2018 2.3  0.7 - 4.0 K/uL Final  . Monocytes Relative 03/16/2018 7  % Final  . Monocytes Absolute 03/16/2018 0.4  0.1 - 1.0 K/uL Final  . Eosinophils Relative 03/16/2018 4  % Final  . Eosinophils Absolute 03/16/2018 0.2  0.0 - 0.7 K/uL Final  . Basophils Relative 03/16/2018 0  % Final  . Basophils Absolute 03/16/2018 0.0  0.0 - 0.1 K/uL Final   Performed at [facility], [address] [place], [place] [address]  . Sodium 03/16/2018 139  135 - 145 mmol/L Final  . Potassium 03/16/2018 3.5  3.5 - 5.1 mmol/L Final  . Chloride 03/16/2018 104  101 - 111 mmol/L Final  . CO2 03/16/2018 26  22 - 32 mmol/L Final  . Glucose, Bld 03/16/2018 110* 65 - 99 mg/dL Final  . BUN 03/16/2018 10  6 - 20 mg/dL Final  . Creatinine, Ser 03/16/2018 1.08  0.61 - 1.24 mg/dL Final  . Calcium 03/16/2018 9.0  8.9 - 10.3 mg/dL Final  . Total Protein 03/16/2018 6.8  6.5 - 8.1 g/dL Final  . Albumin 03/16/2018 4.0  3.5 - 5.0 g/dL Final  . AST 03/16/2018 29  15 - 41 U/L Final  . ALT 03/16/2018 21  17  - 63 U/L Final  . Alkaline Phosphatase 03/16/2018 61  38 - 126 U/L Final  . Total Bilirubin 03/16/2018 0.6  0.3 - 1.2 mg/dL Final  . GFR calc non Af Amer 03/16/2018 >60  >60 mL/min Final  . GFR calc Af Amer 03/16/2018 >60  >60 mL/min Final   Comment: (NOTE) The eGFR has been calculated using the CKD EPI equation. This calculation has not been validated in all clinical situations. eGFR's  persistently <60 mL/min signify possible Chronic Kidney Disease.   Georgiann Hahn gap 03/16/2018 9  5 - 15 Final   Performed at [facility], [address], [place], [place] [address]  . Prostatic Specific Antigen 03/16/2018 3.28  0.00 - 4.00 ng/mL Final   Comment: (NOTE) While PSA levels of <=4.0 ng/ml are reported as reference range, some men with levels below 4.0 ng/ml can have prostate cancer and many men with PSA above 4.0 ng/ml do not have prostate cancer.  Other tests such as free PSA, age specific reference ranges, PSA velocity and PSA doubling time may be helpful especially in men less than 83 years old. Performed at [facility], [place] [address] [place],  [address]      Pathology Orders Placed This Encounter  Procedures  . DG Humerus Right    Standing Status:   Future    Standing Expiration Date:   05/18/2019    Order Specific Question:   Reason for Exam (SYMPTOM  OR DIAGNOSIS REQUIRED)    Answer:   arm pain.  Correlate to right humerus lesion on bone scan    Order Specific Question:   Preferred imaging location?    Answer:   [facility]    Order Specific Question:   Radiology Contrast Protocol - do NOT remove file path    Answer:   [email or web address]  . CBC with Differential/Platelet    Standing Status:   Future    Standing Expiration Date:   03/18/2019  . Comprehensive metabolic panel    Standing Status:   Future    Standing Expiration Date:   03/18/2019  . Lactate dehydrogenase    Standing Status:   Future    Standing  Expiration Date:   03/18/2019  . PSA    Standing Status:   Future    Standing Expiration Date:   03/18/2019  . Protein electrophoresis, serum    Standing Status:   Future    Standing Expiration Date:   03/18/2019       Zoila Shutter MD

## 2018-03-17 NOTE — Patient Instructions (Signed)
[facility]  Discharge Instructions:  You received xgeva today. _______________________________________________________________  Thank you for choosing [facility] at [facility] to provide your oncology and hematology care.  To afford each patient quality time with our providers, please arrive at least 15 minutes before your scheduled appointment.  You need to re-schedule your appointment if you arrive 10 or more minutes late.  We strive to give you quality time with our providers, and arriving late affects you and other patients whose appointments are after yours.  Also, if you no show three or more times for appointments you may be dismissed from the clinic.  Again, thank you for choosing [facility] at [facility]. Our hope is that these requests will allow you access to exceptional care and in a timely manner. _______________________________________________________________  If you have questions after your visit, please contact our office at [phone] between the hours of 8:30 a.m. and 5:00 p.m. Voicemails left after 4:30 p.m. will not be returned until the following business day. _______________________________________________________________  For prescription refill requests, have your pharmacy contact our office. _______________________________________________________________  Recommendations made by the consultant and any test results will be sent to your referring physician. _______________________________________________________________ 

## 2018-03-23 ENCOUNTER — Other Ambulatory Visit: Payer: Self-pay | Admitting: Urology

## 2018-03-23 ENCOUNTER — Ambulatory Visit: Admission: RE | Admit: 2018-03-23 | Payer: Medicare Other | Source: Ambulatory Visit

## 2018-03-23 DIAGNOSIS — C61 Malignant neoplasm of prostate: Secondary | ICD-10-CM

## 2018-03-24 ENCOUNTER — Ambulatory Visit (HOSPITAL_COMMUNITY)
Admission: RE | Admit: 2018-03-24 | Discharge: 2018-03-24 | Disposition: A | Discharge status: Home / Self Care | Payer: Medicare Other | Source: Ambulatory Visit | Attending: Urology | Admitting: Urology

## 2018-03-24 DIAGNOSIS — N1330 Unspecified hydronephrosis: Secondary | ICD-10-CM | POA: Insufficient documentation

## 2018-03-24 DIAGNOSIS — I700 Atherosclerosis of aorta: Secondary | ICD-10-CM | POA: Insufficient documentation

## 2018-03-24 DIAGNOSIS — R590 Localized enlarged lymph nodes: Secondary | ICD-10-CM | POA: Diagnosis not present

## 2018-03-24 DIAGNOSIS — C61 Malignant neoplasm of prostate: Secondary | ICD-10-CM | POA: Diagnosis not present

## 2018-03-24 MED ORDER — IOPAMIDOL (ISOVUE-300) INJECTION 61%
100.0000 mL | Freq: Once | INTRAVENOUS | Status: AC | PRN
Start: 2018-03-24 — End: 2018-03-24
  Administered 2018-03-24: 100 mL via INTRAVENOUS

## 2018-03-29 DIAGNOSIS — E782 Mixed hyperlipidemia: Secondary | ICD-10-CM | POA: Diagnosis not present

## 2018-03-29 DIAGNOSIS — J309 Allergic rhinitis, unspecified: Secondary | ICD-10-CM | POA: Diagnosis not present

## 2018-03-29 DIAGNOSIS — R600 Localized edema: Secondary | ICD-10-CM | POA: Diagnosis not present

## 2018-03-29 DIAGNOSIS — E559 Vitamin D deficiency, unspecified: Secondary | ICD-10-CM | POA: Diagnosis not present

## 2018-03-29 DIAGNOSIS — M545 Low back pain: Secondary | ICD-10-CM | POA: Diagnosis not present

## 2018-03-29 DIAGNOSIS — I10 Essential (primary) hypertension: Secondary | ICD-10-CM | POA: Diagnosis not present

## 2018-04-12 ENCOUNTER — Other Ambulatory Visit (HOSPITAL_COMMUNITY): Payer: Medicare Other

## 2018-04-13 ENCOUNTER — Other Ambulatory Visit (HOSPITAL_COMMUNITY): Payer: Self-pay

## 2018-04-13 DIAGNOSIS — C7951 Secondary malignant neoplasm of bone: Secondary | ICD-10-CM

## 2018-04-13 DIAGNOSIS — C61 Malignant neoplasm of prostate: Secondary | ICD-10-CM

## 2018-04-14 ENCOUNTER — Encounter (HOSPITAL_COMMUNITY): Payer: Self-pay

## 2018-04-14 ENCOUNTER — Ambulatory Visit: Payer: Medicare Other | Admitting: Urology

## 2018-04-14 ENCOUNTER — Inpatient Hospital Stay (HOSPITAL_COMMUNITY): Payer: Medicare Other

## 2018-04-14 ENCOUNTER — Encounter (HOSPITAL_COMMUNITY): Payer: Self-pay | Admitting: Internal Medicine

## 2018-04-14 ENCOUNTER — Inpatient Hospital Stay (HOSPITAL_COMMUNITY): Payer: Medicare Other | Attending: Internal Medicine | Admitting: Internal Medicine

## 2018-04-14 ENCOUNTER — Other Ambulatory Visit: Payer: Self-pay

## 2018-04-14 VITALS — BP 154/77 | HR 65 | Resp 18 | Wt 291.2 lb

## 2018-04-14 DIAGNOSIS — C61 Malignant neoplasm of prostate: Secondary | ICD-10-CM

## 2018-04-14 DIAGNOSIS — C7951 Secondary malignant neoplasm of bone: Secondary | ICD-10-CM | POA: Diagnosis not present

## 2018-04-14 DIAGNOSIS — Z809 Family history of malignant neoplasm, unspecified: Secondary | ICD-10-CM

## 2018-04-14 DIAGNOSIS — N131 Hydronephrosis with ureteral stricture, not elsewhere classified: Secondary | ICD-10-CM

## 2018-04-14 DIAGNOSIS — N3941 Urge incontinence: Secondary | ICD-10-CM | POA: Diagnosis not present

## 2018-04-14 DIAGNOSIS — N289 Disorder of kidney and ureter, unspecified: Secondary | ICD-10-CM | POA: Diagnosis not present

## 2018-04-14 DIAGNOSIS — I10 Essential (primary) hypertension: Secondary | ICD-10-CM | POA: Diagnosis not present

## 2018-04-14 LAB — CBC WITH DIFFERENTIAL/PLATELET
BASOS ABS: 0 10*3/uL (ref 0.0–0.1)
Basophils Relative: 0 %
Eosinophils Absolute: 0.3 10*3/uL (ref 0.0–0.7)
Eosinophils Relative: 4 %
HEMATOCRIT: 33.9 % — AB (ref 39.0–52.0)
HEMOGLOBIN: 10.9 g/dL — AB (ref 13.0–17.0)
Lymphocytes Relative: 29 %
Lymphs Abs: 1.9 10*3/uL (ref 0.7–4.0)
MCH: 28.1 pg (ref 26.0–34.0)
MCHC: 32.2 g/dL (ref 30.0–36.0)
MCV: 87.4 fL (ref 78.0–100.0)
MONO ABS: 0.5 10*3/uL (ref 0.1–1.0)
MONOS PCT: 7 %
NEUTROS ABS: 4 10*3/uL (ref 1.7–7.7)
NEUTROS PCT: 60 %
Platelets: 165 10*3/uL (ref 150–400)
RBC: 3.88 MIL/uL — ABNORMAL LOW (ref 4.22–5.81)
RDW: 14.7 % (ref 11.5–15.5)
WBC: 6.7 10*3/uL (ref 4.0–10.5)

## 2018-04-14 LAB — COMPREHENSIVE METABOLIC PANEL
ALBUMIN: 4 g/dL (ref 3.5–5.0)
ALK PHOS: 67 U/L (ref 38–126)
ALT: 20 U/L (ref 17–63)
AST: 25 U/L (ref 15–41)
Anion gap: 10 (ref 5–15)
BILIRUBIN TOTAL: 0.5 mg/dL (ref 0.3–1.2)
BUN: 15 mg/dL (ref 6–20)
CALCIUM: 9.4 mg/dL (ref 8.9–10.3)
CO2: 27 mmol/L (ref 22–32)
CREATININE: 1.85 mg/dL — AB (ref 0.61–1.24)
Chloride: 102 mmol/L (ref 101–111)
GFR calc Af Amer: 39 mL/min — ABNORMAL LOW (ref >60)
GFR, EST NON AFRICAN AMERICAN: 33 mL/min — AB (ref >60)
GLUCOSE: 90 mg/dL (ref 65–99)
POTASSIUM: 4.4 mmol/L (ref 3.5–5.1)
Sodium: 139 mmol/L (ref 135–145)
TOTAL PROTEIN: 7.1 g/dL (ref 6.5–8.1)

## 2018-04-14 LAB — PSA: Prostatic Specific Antigen: 4.61 ng/mL — ABNORMAL HIGH (ref 0.00–4.00)

## 2018-04-14 MED ORDER — DENOSUMAB 120 MG/1.7ML ~~LOC~~ SOLN
120.0000 mg | Freq: Once | SUBCUTANEOUS | Status: AC
  Administered 2018-04-14: 120 mg via SUBCUTANEOUS
  Filled 2018-04-14: qty 1.7

## 2018-04-14 NOTE — Patient Instructions (Signed)
[facility] at [facility] Discharge Instructions  Received Xgeva injection today. Follow-up as scheduled. Call clinic for any questions or concerns   Thank you for choosing [facility] at [facility] to provide your oncology and hematology care.  To afford each patient quality time with our provider, please arrive at least 15 minutes before your scheduled appointment time.   If you have a lab appointment with the Cancer Center please come in thru the  Main Entrance and check in at the main information desk  You need to re-schedule your appointment should you arrive 10 or more minutes late.  We strive to give you quality time with our providers, and arriving late affects you and other patients whose appointments are after yours.  Also, if you no show three or more times for appointments you may be dismissed from the clinic at the providers discretion.     Again, thank you for choosing [facility].  Our hope is that these requests will decrease the amount of time that you wait before being seen by our physicians.       _____________________________________________________________  Should you have questions after your visit to [facility], please contact our office at [phone] between the hours of 8:30 a.m. and 4:30 p.m.  Voicemails left after 4:30 p.m. will not be returned until the following business day.  For prescription refill requests, have your pharmacy contact our office.       Resources For Cancer Patients and their Caregivers ? American Cancer Society: Can assist with transportation, wigs, general needs, runs Look Good Feel Better.        1-888-227-6333 ? Cancer Care: Provides financial assistance, online support groups, medication/co-pay assistance.  1-800-813-HOPE (4673) ? [facility] Assists [place] cancer patients and their families through emotional , educational and  financial support.  336-427-4357 ? [place] DSS Where to apply for food stamps, Medicaid and utility assistance. 336-342-1394 ? [facility]: Transportation to medical appointments. 336-347-2287 ? Social Security Administration: May apply for disability if have a Stage IV cancer. 336-342-7796 1-800-772-1213 ? [place] Aging, Disability and Transit Services: Assists with nutrition, care and transit needs. 336-349-2343  Cancer Center Support Programs:   > Cancer Support Group  2nd Tuesday of the month 1pm-2pm, Journey Room   > Creative Journey  3rd Tuesday of the month 1130am-1pm, Journey Room    

## 2018-04-14 NOTE — Progress Notes (Signed)
[name] tolerated Xgeva injection well without complaints or incident. Calcium 9.4 today and pt denied any tooth or jaw pain and no recent or future dental visits. VSS Pt discharged self ambulatory in satisfactory condition

## 2018-04-14 NOTE — Progress Notes (Signed)
Diagnosis Prostate cancer metastatic to bone [facility]) - Plan: CBC with Differential/Platelet, Comprehensive metabolic panel, Lactate dehydrogenase, PSA  Staging Cancer Staging No matching staging information was found for the patient.  Assessment and Plan:  1.  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  Pt was previously treated with prostatectomy and salvage radiation therapy. CT chest/abd/pelvis done 12/14/2017 showed  IMPRESSION: Status post prostatectomy.  No findings specific for recurrent or metastatic disease.  Stable compression fracture deformity at T8.  Mild sclerosis along the left iliac bone, unchanged. Attention on follow-up is suggested.  He had bone scan done 03/09/2018 that showed  IMPRESSION: Scattered degenerative type uptake with again identified mild uptake at the T8 vertebral body, corresponding to a chronic T8 compression fracture by CT.  Questionable abnormal uptake versus artifact at the RIGHT humeral diaphysis; dedicated RIGHT humeral radiographs recommended.  No other significant scintigraphic abnormalities identified.  Pt xray of right humerus done 03/17/2018 that was negative.  I have again discussed with him PSA continues to rise and labs done on 03/16/2018 showed a PSA of 3.28.  It had previously been 2 in April 2019.  It is unclear if patient has been compliant with his Lupron injections.  He is scheduled to see urology today and I will discuss the case with them as he had prior discussions with Dr.  Alyson Ingles regarding [name].  Will determine if they will RX .  He will be given follow-up in 3 months pending urology evaluation. Will discuss case with Dr Alyson Ingles.    He will receive Xgeva today.  Labs reviewed with pt today.     2.  Bone mets.  Patient is here today for Xgeva.  Creatinine 1.85.  Calcium level is normal.  He will proceed with Xgeva as directed.   Bone scan done 03/09/2018 that showed:  IMPRESSION: Scattered degenerative type  uptake with again identified mild uptake at the T8 vertebral body, corresponding to a chronic T8 compression fracture by CT.  Questionable abnormal uptake versus artifact at the RIGHT humeral diaphysis; dedicated RIGHT humeral radiographs recommended.  No other significant scintigraphic abnormalities identified.  Pt had xray of right humerus done 03/17/2018 that was negative.  He will continue monthly Xgeva.    3.  RI.  Cr noted to be increased at 1.85.  Will repeat chemistries in 1 month with xgeva.    4.  Hypertension.  BP is 154/77.  Follow-up with PCP.    BRIEF ONCOLOGIC HISTORY:  (From Dr. Donald Pore last note on 09/03/16)    Current Status: Patient is seen today for follow-up.  He is here for evaluation prior to [place] See  He is scheduled to see urology today.    Problem List Patient Active Problem List   Diagnosis Date Noted  . Tubular adenoma of colon [D12.6] 12/03/2016  . Morbid obesity ([place]) [E66.01] 10/24/2016  . Essential hypertension [I10] 10/24/2016  . HLD (hyperlipidemia) [E78.5] 10/24/2016  . 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 Chronic back pain [M54.9, G89.29] 10/24/2016  . PUD (peptic ulcer disease) [K27.9] 10/24/2016  . GERD (gastroesophageal reflux disease) [K21.9] 10/24/2016  . Hemorrhoids [K64.9] 10/24/2016  . Sleep apnea [G47.30] 10/24/2016  . Uncontrolled daytime somnolence [R40.0] 10/24/2016  . HOH (hard of hearing) [H91.90] 10/24/2016  . Testicular discomfort [N50.819] 05/07/2016  . Prostate cancer metastatic to bone [facility]) [C61, C79.51] 04/04/2016    Past Medical History Past Medical History:  Diagnosis Date  . Cancer [facility])    prostate  .  GERD (gastroesophageal reflux disease)   . Hyperlipidemia   . Hypertension   . Prostate cancer metastatic to bone ([place]) 04/04/2016  . Sleep apnea 10/24/2016    Past Surgical History Past Surgical History:  Procedure Laterality Date  . CHOLECYSTECTOMY    . HAND SURGERY      . INGUINAL HERNIA REPAIR Left   . PROSTATECTOMY    . PROSTATECTOMY    . radical prostatectomy    . TONSILLECTOMY      Family History Family History  Problem Relation Age of Onset  . Heart failure Mother        CHF  . Prostate cancer Father   . Narcolepsy Sister   . Cancer Daughter      Social History  reports that he quit smoking about 29 years ago. His smoking use included cigarettes. He has a 32.00 pack-year smoking history. He has never used smokeless tobacco. He reports that he does not drink alcohol or use drugs.  Medications  Current Outpatient Medications:  .  benazepril (LOTENSIN) 20 MG tablet, TAKE ONE TABLET BY MOUTH DAILY., Disp: 90 tablet, Rfl: 3 .  cetirizine (ZYRTEC) 10 MG tablet, TAKE ONE TABLET BY MOUTH DAILY., Disp: 30 tablet, Rfl: 0 .  clotrimazole-betamethasone (LOTRISONE) cream, Apply 1 application topically 2 (two) times daily., Disp: 30 g, Rfl: 0 .  fexofenadine (ALLEGRA) 180 MG tablet, Take 180 mg by mouth daily., Disp: , Rfl: 2 .  hydrochlorothiazide (HYDRODIURIL) 25 MG tablet, TAKE ONE TABLET BY MOUTH DAILY., Disp: 90 tablet, Rfl: 3 .  losartan (COZAAR) 100 MG tablet, Take 100 mg by mouth daily., Disp: , Rfl: 5 .  meloxicam (MOBIC) 7.5 MG tablet, Take 1 tablet (7.5 mg total) daily by mouth., Disp: 30 tablet, Rfl: 3 .  omeprazole (PRILOSEC) 40 MG capsule, TAKE ONE CAPSULE BY MOUTH DAILY, Disp: 30 capsule, Rfl: 1 .  Potassium Chloride ER 20 MEQ TBCR, Take 1 tablet 2 (two) times daily by mouth., Disp: 60 tablet, Rfl: 5 .  potassium chloride SA (K-DUR,KLOR-CON) 20 MEQ tablet, Take 20 mEq by mouth daily., Disp: , Rfl: 5 .  selenium sulfide (SELSUN) 2.5 % shampoo, Apply 1 application topically daily as needed for irritation., Disp: 118 mL, Rfl: 12 .  tiZANidine (ZANAFLEX) 4 MG tablet, TAKE ONE TABLET BY MOUTH EVERY 6 HOURS AS NEEDED FOR MUSCLE SPASMS., Disp: , Rfl: 3 .  traMADol (ULTRAM) 50 MG tablet, TAKE ONE TABLET BY MOUTH EVERY 6 HOURS AS NEEDED FOR PAIN,  Disp: 50 tablet, Rfl: 0  Allergies Patient has no known allergies.  Review of Systems Review of Systems - Oncology ROS negative    Physical Exam  Vitals Wt Readings from Last 3 Encounters:  04/14/18 291 lb 3.2 oz (132.1 kg)  03/17/18 286 lb 9.6 oz (130 kg)  02/11/18 284 lb 6.4 oz (129 kg)   Temp Readings from Last 3 Encounters:  03/17/18 98.5 F (36.9 C) (Oral)  01/14/18 98.1 F (36.7 C) (Oral)  12/18/17 98.5 F (36.9 C) (Oral)   BP Readings from Last 3 Encounters:  04/14/18 (!) 154/77  03/17/18 113/64  02/11/18 (!) 141/64   Pulse Readings from Last 3 Encounters:  04/14/18 65  03/17/18 70  02/11/18 60   Constitutional: Chronically ill appearing male in NAD.   HENT: Head: Normocephalic and atraumatic.  Mouth/Throat: No oropharyngeal exudate. Mucosa moist. Eyes: Pupils are equal, round, and reactive to light. Conjunctivae are normal. No scleral icterus.  Neck: Normal range of motion. Neck supple. No JVD  present.  Cardiovascular: Normal rate, regular rhythm and normal heart sounds.  Exam reveals no gallop and no friction rub.   No murmur heard. Pulmonary/Chest: Effort normal and breath sounds normal. No respiratory distress. No wheezes.No rales.  Abdominal: Soft. Bowel sounds are normal. No distension. There is no tenderness. There is no guarding.  Musculoskeletal: No edema or tenderness.  Lymphadenopathy: No cervical, axillary or supraclavicular adenopathy.  Neurological: Alert and oriented to person, place, and time. No cranial nerve deficit.  Skin: Skin is warm and dry. No rash noted. No erythema. No pallor.  Psychiatric: Affect and judgment normal.   Labs Appointment on 04/14/2018  Component Date Value Ref Range Status  . WBC 04/14/2018 6.7  4.0 - 10.5 K/uL Final  . RBC 04/14/2018 3.88* 4.22 - 5.81 MIL/uL Final  . Hemoglobin 04/14/2018 10.9* 13.0 - 17.0 g/dL Final  . HCT 04/14/2018 33.9* 39.0 - 52.0 % Final  . MCV 04/14/2018 87.4  78.0 - 100.0 fL Final  .  MCH 04/14/2018 28.1  26.0 - 34.0 pg Final  . MCHC 04/14/2018 32.2  30.0 - 36.0 g/dL Final  . RDW 04/14/2018 14.7  11.5 - 15.5 % Final  . Platelets 04/14/2018 165  150 - 400 K/uL Final  . Neutrophils Relative % 04/14/2018 60  % Final  . Neutro Abs 04/14/2018 4.0  1.7 - 7.7 K/uL Final  . Lymphocytes Relative 04/14/2018 29  % Final  . Lymphs Abs 04/14/2018 1.9  0.7 - 4.0 K/uL Final  . Monocytes Relative 04/14/2018 7  % Final  . Monocytes Absolute 04/14/2018 0.5  0.1 - 1.0 K/uL Final  . Eosinophils Relative 04/14/2018 4  % Final  . Eosinophils Absolute 04/14/2018 0.3  0.0 - 0.7 K/uL Final  . Basophils Relative 04/14/2018 0  % Final  . Basophils Absolute 04/14/2018 0.0  0.0 - 0.1 K/uL Final   Performed at [facility], [address] [place], [place] [address]  . Sodium 04/14/2018 139  135 - 145 mmol/L Final  . Potassium 04/14/2018 4.4  3.5 - 5.1 mmol/L Final  . Chloride 04/14/2018 102  101 - 111 mmol/L Final  . CO2 04/14/2018 27  22 - 32 mmol/L Final  . Glucose, Bld 04/14/2018 90  65 - 99 mg/dL Final  . BUN 04/14/2018 15  6 - 20 mg/dL Final  . Creatinine, Ser 04/14/2018 1.85* 0.61 - 1.24 mg/dL Final  . Calcium 04/14/2018 9.4  8.9 - 10.3 mg/dL Final  . Total Protein 04/14/2018 7.1  6.5 - 8.1 g/dL Final  . Albumin 04/14/2018 4.0  3.5 - 5.0 g/dL Final  . AST 04/14/2018 25  15 - 41 U/L Final  . ALT 04/14/2018 20  17 - 63 U/L Final  . Alkaline Phosphatase 04/14/2018 67  38 - 126 U/L Final  . Total Bilirubin 04/14/2018 0.5  0.3 - 1.2 mg/dL Final  . GFR calc non Af Amer 04/14/2018 33* >60 mL/min Final  . GFR calc Af Amer 04/14/2018 39* >60 mL/min Final   Comment: (NOTE) The eGFR has been calculated using the CKD EPI equation. This calculation has not been validated in all clinical situations. eGFR's persistently <60 mL/min signify possible Chronic Kidney Disease.   Georgiann Hahn gap 04/14/2018 10  5 - 15 Final   Performed at [facility], [address], [place], [place] [address]      Pathology Orders Placed This Encounter  Procedures  . CBC with Differential/Platelet    Standing Status:   Future    Standing  Expiration Date:   04/15/2019  . Comprehensive metabolic panel    Standing Status:   Future    Standing Expiration Date:   04/15/2019  . Lactate dehydrogenase    Standing Status:   Future    Standing Expiration Date:   04/15/2019  . PSA    Standing Status:   Future    Standing Expiration Date:   04/15/2019       Zoila Shutter MD

## 2018-04-19 ENCOUNTER — Other Ambulatory Visit: Payer: Self-pay | Admitting: Urology

## 2018-04-19 DIAGNOSIS — C61 Malignant neoplasm of prostate: Secondary | ICD-10-CM

## 2018-04-19 DIAGNOSIS — N130 Hydronephrosis with ureteropelvic junction obstruction: Secondary | ICD-10-CM

## 2018-04-22 DIAGNOSIS — I10 Essential (primary) hypertension: Secondary | ICD-10-CM | POA: Diagnosis not present

## 2018-04-22 DIAGNOSIS — R600 Localized edema: Secondary | ICD-10-CM | POA: Diagnosis not present

## 2018-04-22 DIAGNOSIS — M79669 Pain in unspecified lower leg: Secondary | ICD-10-CM | POA: Diagnosis not present

## 2018-04-22 DIAGNOSIS — M545 Low back pain: Secondary | ICD-10-CM | POA: Diagnosis not present

## 2018-04-22 DIAGNOSIS — M62838 Other muscle spasm: Secondary | ICD-10-CM | POA: Diagnosis not present

## 2018-04-22 DIAGNOSIS — J309 Allergic rhinitis, unspecified: Secondary | ICD-10-CM | POA: Diagnosis not present

## 2018-04-22 DIAGNOSIS — M79606 Pain in leg, unspecified: Secondary | ICD-10-CM | POA: Diagnosis not present

## 2018-05-12 ENCOUNTER — Other Ambulatory Visit (HOSPITAL_COMMUNITY): Payer: Medicare Other

## 2018-05-12 ENCOUNTER — Ambulatory Visit (HOSPITAL_COMMUNITY): Payer: Medicare Other

## 2018-05-17 ENCOUNTER — Ambulatory Visit (HOSPITAL_COMMUNITY): Admission: RE | Admit: 2018-05-17 | Payer: Medicare Other | Source: Ambulatory Visit

## 2018-05-21 DIAGNOSIS — E8770 Fluid overload, unspecified: Secondary | ICD-10-CM | POA: Diagnosis not present

## 2018-05-21 DIAGNOSIS — R601 Generalized edema: Secondary | ICD-10-CM | POA: Diagnosis not present

## 2018-05-26 ENCOUNTER — Ambulatory Visit: Payer: Medicare Other | Admitting: Urology

## 2018-06-09 ENCOUNTER — Encounter (HOSPITAL_COMMUNITY): Payer: Self-pay

## 2018-06-09 ENCOUNTER — Inpatient Hospital Stay (HOSPITAL_COMMUNITY): Payer: Medicare HMO

## 2018-06-09 ENCOUNTER — Inpatient Hospital Stay (HOSPITAL_COMMUNITY): Payer: Medicare HMO | Attending: Hematology

## 2018-06-09 VITALS — BP 162/71 | HR 76 | Temp 98.0°F | Resp 18

## 2018-06-09 DIAGNOSIS — C7951 Secondary malignant neoplasm of bone: Secondary | ICD-10-CM | POA: Diagnosis not present

## 2018-06-09 DIAGNOSIS — C61 Malignant neoplasm of prostate: Secondary | ICD-10-CM | POA: Insufficient documentation

## 2018-06-09 LAB — COMPREHENSIVE METABOLIC PANEL
ALBUMIN: 3.7 g/dL (ref 3.5–5.0)
ALT: 21 U/L (ref 0–44)
AST: 26 U/L (ref 15–41)
Alkaline Phosphatase: 76 U/L (ref 38–126)
Anion gap: 10 (ref 5–15)
BUN: 17 mg/dL (ref 8–23)
CHLORIDE: 102 mmol/L (ref 98–111)
CO2: 27 mmol/L (ref 22–32)
CREATININE: 2.1 mg/dL — AB (ref 0.61–1.24)
Calcium: 9 mg/dL (ref 8.9–10.3)
GFR calc Af Amer: 33 mL/min — ABNORMAL LOW (ref >60)
GFR calc non Af Amer: 29 mL/min — ABNORMAL LOW (ref >60)
Glucose, Bld: 107 mg/dL — ABNORMAL HIGH (ref 70–99)
Potassium: 3.9 mmol/L (ref 3.5–5.1)
Sodium: 139 mmol/L (ref 135–145)
Total Bilirubin: 0.8 mg/dL (ref 0.3–1.2)
Total Protein: 6.9 g/dL (ref 6.5–8.1)

## 2018-06-09 MED ORDER — DENOSUMAB 120 MG/1.7ML ~~LOC~~ SOLN
120.0000 mg | Freq: Once | SUBCUTANEOUS | Status: AC
  Administered 2018-06-09: 120 mg via SUBCUTANEOUS
  Filled 2018-06-09: qty 1.7

## 2018-06-09 NOTE — Patient Instructions (Signed)
[facility] at [facility]  Discharge Instructions: You received an xgeva shot today.  _______________________________________________________________  Thank you for choosing [facility] at [facility] to provide your oncology and hematology care.  To afford each patient quality time with our providers, please arrive at least 15 minutes before your scheduled appointment.  You need to re-schedule your appointment if you arrive 10 or more minutes late.  We strive to give you quality time with our providers, and arriving late affects you and other patients whose appointments are after yours.  Also, if you no show three or more times for appointments you may be dismissed from the clinic.  Again, thank you for choosing [facility] at [facility]. Our hope is that these requests will allow you access to exceptional care and in a timely manner. _______________________________________________________________  If you have questions after your visit, please contact our office at [phone] between the hours of 8:30 a.m. and 5:00 p.m. Voicemails left after 4:30 p.m. will not be returned until the following business day. _______________________________________________________________  For prescription refill requests, have your pharmacy contact our office. _______________________________________________________________  Recommendations made by the consultant and any test results will be sent to your referring physician. _______________________________________________________________ 

## 2018-06-09 NOTE — Progress Notes (Signed)
Patient taking calcium tabs once a day as directed. Denied jaw or tooth pain.  Tolerated shot with no complaints voiced.  Band aid applied.  Left ambulatory with VSS and no s/s of distress noted.

## 2018-06-22 DIAGNOSIS — C61 Malignant neoplasm of prostate: Secondary | ICD-10-CM | POA: Diagnosis not present

## 2018-06-24 ENCOUNTER — Ambulatory Visit (HOSPITAL_COMMUNITY)
Admission: RE | Admit: 2018-06-24 | Discharge: 2018-06-24 | Disposition: A | Discharge status: Home / Self Care | Payer: Medicare HMO | Source: Ambulatory Visit | Attending: Urology | Admitting: Urology

## 2018-06-24 DIAGNOSIS — C61 Malignant neoplasm of prostate: Secondary | ICD-10-CM | POA: Diagnosis not present

## 2018-06-24 DIAGNOSIS — R590 Localized enlarged lymph nodes: Secondary | ICD-10-CM | POA: Insufficient documentation

## 2018-06-24 DIAGNOSIS — N281 Cyst of kidney, acquired: Secondary | ICD-10-CM | POA: Insufficient documentation

## 2018-06-24 DIAGNOSIS — N130 Hydronephrosis with ureteropelvic junction obstruction: Secondary | ICD-10-CM | POA: Insufficient documentation

## 2018-06-24 DIAGNOSIS — N1339 Other hydronephrosis: Secondary | ICD-10-CM | POA: Diagnosis not present

## 2018-06-30 ENCOUNTER — Ambulatory Visit: Payer: Medicare Other | Admitting: Urology

## 2018-06-30 DIAGNOSIS — N131 Hydronephrosis with ureteral stricture, not elsewhere classified: Secondary | ICD-10-CM | POA: Diagnosis not present

## 2018-06-30 DIAGNOSIS — N3941 Urge incontinence: Secondary | ICD-10-CM | POA: Diagnosis not present

## 2018-06-30 DIAGNOSIS — C61 Malignant neoplasm of prostate: Secondary | ICD-10-CM

## 2018-07-05 ENCOUNTER — Inpatient Hospital Stay (HOSPITAL_COMMUNITY): Payer: Medicare Other | Attending: Internal Medicine

## 2018-07-05 DIAGNOSIS — C61 Malignant neoplasm of prostate: Secondary | ICD-10-CM | POA: Insufficient documentation

## 2018-07-05 DIAGNOSIS — C7951 Secondary malignant neoplasm of bone: Secondary | ICD-10-CM | POA: Diagnosis present

## 2018-07-05 LAB — CBC WITH DIFFERENTIAL/PLATELET
Basophils Absolute: 0 10*3/uL (ref 0.0–0.1)
Basophils Relative: 0 %
EOS ABS: 0.2 10*3/uL (ref 0.0–0.7)
Eosinophils Relative: 4 %
HEMATOCRIT: 33 % — AB (ref 39.0–52.0)
HEMOGLOBIN: 10.5 g/dL — AB (ref 13.0–17.0)
LYMPHS PCT: 31 %
Lymphs Abs: 2 10*3/uL (ref 0.7–4.0)
MCH: 27.6 pg (ref 26.0–34.0)
MCHC: 31.8 g/dL (ref 30.0–36.0)
MCV: 86.6 fL (ref 78.0–100.0)
Monocytes Absolute: 0.4 10*3/uL (ref 0.1–1.0)
Monocytes Relative: 6 %
NEUTROS ABS: 3.8 10*3/uL (ref 1.7–7.7)
NEUTROS PCT: 59 %
Platelets: 185 10*3/uL (ref 150–400)
RBC: 3.81 MIL/uL — AB (ref 4.22–5.81)
RDW: 15.7 % — ABNORMAL HIGH (ref 11.5–15.5)
WBC: 6.4 10*3/uL (ref 4.0–10.5)

## 2018-07-05 LAB — COMPREHENSIVE METABOLIC PANEL
ALK PHOS: 66 U/L (ref 38–126)
ALT: 19 U/L (ref 0–44)
AST: 27 U/L (ref 15–41)
Albumin: 4.1 g/dL (ref 3.5–5.0)
Anion gap: 8 (ref 5–15)
BUN: 22 mg/dL (ref 8–23)
CALCIUM: 9.1 mg/dL (ref 8.9–10.3)
CO2: 28 mmol/L (ref 22–32)
CREATININE: 2.08 mg/dL — AB (ref 0.61–1.24)
Chloride: 102 mmol/L (ref 98–111)
GFR calc non Af Amer: 29 mL/min — ABNORMAL LOW (ref >60)
GFR, EST AFRICAN AMERICAN: 33 mL/min — AB (ref >60)
Glucose, Bld: 101 mg/dL — ABNORMAL HIGH (ref 70–99)
Potassium: 4.2 mmol/L (ref 3.5–5.1)
SODIUM: 138 mmol/L (ref 135–145)
Total Bilirubin: 0.6 mg/dL (ref 0.3–1.2)
Total Protein: 7.4 g/dL (ref 6.5–8.1)

## 2018-07-05 LAB — LACTATE DEHYDROGENASE: LDH: 179 U/L (ref 98–192)

## 2018-07-05 LAB — PSA: PROSTATIC SPECIFIC ANTIGEN: 8.64 ng/mL — AB (ref 0.00–4.00)

## 2018-07-06 ENCOUNTER — Other Ambulatory Visit (HOSPITAL_COMMUNITY): Payer: Self-pay | Admitting: Internal Medicine

## 2018-07-07 ENCOUNTER — Ambulatory Visit (HOSPITAL_COMMUNITY): Payer: Medicare Other | Admitting: Internal Medicine

## 2018-07-07 ENCOUNTER — Inpatient Hospital Stay (HOSPITAL_COMMUNITY): Payer: Medicare Other

## 2018-07-07 ENCOUNTER — Other Ambulatory Visit: Payer: Self-pay

## 2018-07-07 ENCOUNTER — Other Ambulatory Visit (HOSPITAL_COMMUNITY): Payer: Medicare Other

## 2018-07-07 ENCOUNTER — Encounter (HOSPITAL_COMMUNITY): Payer: Self-pay

## 2018-07-07 VITALS — BP 152/88 | HR 72 | Temp 98.1°F | Resp 16

## 2018-07-07 DIAGNOSIS — C61 Malignant neoplasm of prostate: Secondary | ICD-10-CM

## 2018-07-07 DIAGNOSIS — C7951 Secondary malignant neoplasm of bone: Secondary | ICD-10-CM

## 2018-07-07 MED ORDER — DENOSUMAB 120 MG/1.7ML ~~LOC~~ SOLN
120.0000 mg | Freq: Once | SUBCUTANEOUS | Status: AC
  Administered 2018-07-07: 120 mg via SUBCUTANEOUS
  Filled 2018-07-07: qty 1.7

## 2018-07-07 NOTE — Progress Notes (Signed)
Pt here today for Xgeva injection. Pt given injection in left upper abdomen. Pt tolerated injection with no complaints. Pt stable and discharged home ambulatory. Pt to return as scheduled.

## 2018-07-14 ENCOUNTER — Ambulatory Visit (HOSPITAL_COMMUNITY): Payer: Medicare HMO | Admitting: Internal Medicine

## 2018-07-22 ENCOUNTER — Ambulatory Visit (HOSPITAL_COMMUNITY): Payer: Medicare Other | Admitting: Internal Medicine

## 2018-08-05 ENCOUNTER — Other Ambulatory Visit: Payer: Self-pay

## 2018-08-05 ENCOUNTER — Encounter (HOSPITAL_COMMUNITY): Payer: Self-pay

## 2018-08-05 ENCOUNTER — Inpatient Hospital Stay (HOSPITAL_COMMUNITY): Payer: Medicare Other | Attending: Internal Medicine

## 2018-08-05 ENCOUNTER — Inpatient Hospital Stay (HOSPITAL_COMMUNITY): Payer: Medicare Other

## 2018-08-05 VITALS — BP 152/75 | HR 58 | Temp 98.5°F | Resp 18

## 2018-08-05 DIAGNOSIS — C61 Malignant neoplasm of prostate: Secondary | ICD-10-CM | POA: Insufficient documentation

## 2018-08-05 DIAGNOSIS — C7951 Secondary malignant neoplasm of bone: Secondary | ICD-10-CM | POA: Insufficient documentation

## 2018-08-05 DIAGNOSIS — R9721 Rising PSA following treatment for malignant neoplasm of prostate: Secondary | ICD-10-CM

## 2018-08-05 LAB — COMPREHENSIVE METABOLIC PANEL
ALK PHOS: 65 U/L (ref 38–126)
ALT: 19 U/L (ref 0–44)
AST: 26 U/L (ref 15–41)
Albumin: 3.9 g/dL (ref 3.5–5.0)
Anion gap: 9 (ref 5–15)
BUN: 21 mg/dL (ref 8–23)
CALCIUM: 9.9 mg/dL (ref 8.9–10.3)
CO2: 28 mmol/L (ref 22–32)
CREATININE: 1.91 mg/dL — AB (ref 0.61–1.24)
Chloride: 102 mmol/L (ref 98–111)
GFR calc non Af Amer: 32 mL/min — ABNORMAL LOW (ref >60)
GFR, EST AFRICAN AMERICAN: 37 mL/min — AB (ref >60)
Glucose, Bld: 100 mg/dL — ABNORMAL HIGH (ref 70–99)
Potassium: 4.1 mmol/L (ref 3.5–5.1)
SODIUM: 139 mmol/L (ref 135–145)
Total Bilirubin: 0.6 mg/dL (ref 0.3–1.2)
Total Protein: 7.4 g/dL (ref 6.5–8.1)

## 2018-08-05 MED ORDER — DENOSUMAB 120 MG/1.7ML ~~LOC~~ SOLN
120.0000 mg | Freq: Once | SUBCUTANEOUS | Status: AC
  Administered 2018-08-05: 120 mg via SUBCUTANEOUS
  Filled 2018-08-05: qty 1.7

## 2018-08-05 NOTE — Progress Notes (Signed)
[name] presents today for injection per the provider's orders.  Xgeva administration without incident; see MAR for injection details.  Patient tolerated procedure well and without incident.  No questions or complaints noted at this time.  Discharged ambulatory.  

## 2018-09-01 ENCOUNTER — Other Ambulatory Visit (HOSPITAL_COMMUNITY): Payer: Medicare Other

## 2018-09-02 ENCOUNTER — Other Ambulatory Visit (HOSPITAL_COMMUNITY): Payer: Self-pay

## 2018-09-02 ENCOUNTER — Inpatient Hospital Stay (HOSPITAL_BASED_OUTPATIENT_CLINIC_OR_DEPARTMENT_OTHER): Payer: Medicare Other | Admitting: Internal Medicine

## 2018-09-02 ENCOUNTER — Inpatient Hospital Stay (HOSPITAL_COMMUNITY): Payer: Medicare Other | Attending: Internal Medicine

## 2018-09-02 ENCOUNTER — Inpatient Hospital Stay (HOSPITAL_COMMUNITY): Payer: Medicare Other

## 2018-09-02 ENCOUNTER — Encounter (HOSPITAL_COMMUNITY): Payer: Self-pay | Admitting: Internal Medicine

## 2018-09-02 VITALS — BP 147/77 | HR 74 | Temp 97.6°F | Resp 16 | Wt 284.0 lb

## 2018-09-02 DIAGNOSIS — C61 Malignant neoplasm of prostate: Secondary | ICD-10-CM | POA: Insufficient documentation

## 2018-09-02 DIAGNOSIS — N289 Disorder of kidney and ureter, unspecified: Secondary | ICD-10-CM

## 2018-09-02 DIAGNOSIS — C7951 Secondary malignant neoplasm of bone: Secondary | ICD-10-CM

## 2018-09-02 DIAGNOSIS — Z809 Family history of malignant neoplasm, unspecified: Secondary | ICD-10-CM

## 2018-09-02 DIAGNOSIS — I10 Essential (primary) hypertension: Secondary | ICD-10-CM

## 2018-09-02 DIAGNOSIS — R9721 Rising PSA following treatment for malignant neoplasm of prostate: Secondary | ICD-10-CM

## 2018-09-02 LAB — CBC WITH DIFFERENTIAL/PLATELET
Abs Immature Granulocytes: 0.02 10*3/uL (ref 0.00–0.07)
BASOS PCT: 0 %
Basophils Absolute: 0 10*3/uL (ref 0.0–0.1)
Eosinophils Absolute: 0.2 10*3/uL (ref 0.0–0.5)
Eosinophils Relative: 3 %
HCT: 33.3 % — ABNORMAL LOW (ref 39.0–52.0)
Hemoglobin: 10.8 g/dL — ABNORMAL LOW (ref 13.0–17.0)
Immature Granulocytes: 0 %
LYMPHS PCT: 31 %
Lymphs Abs: 2.1 10*3/uL (ref 0.7–4.0)
MCH: 27.7 pg (ref 26.0–34.0)
MCHC: 32.4 g/dL (ref 30.0–36.0)
MCV: 85.4 fL (ref 80.0–100.0)
MONO ABS: 0.5 10*3/uL (ref 0.1–1.0)
Monocytes Relative: 7 %
NEUTROS ABS: 4 10*3/uL (ref 1.7–7.7)
NRBC: 0 % (ref 0.0–0.2)
Neutrophils Relative %: 59 %
PLATELETS: 193 10*3/uL (ref 150–400)
RBC: 3.9 MIL/uL — AB (ref 4.22–5.81)
RDW: 15.7 % — ABNORMAL HIGH (ref 11.5–15.5)
WBC: 6.8 10*3/uL (ref 4.0–10.5)

## 2018-09-02 LAB — COMPREHENSIVE METABOLIC PANEL
ALT: 18 U/L (ref 0–44)
AST: 25 U/L (ref 15–41)
Albumin: 4.2 g/dL (ref 3.5–5.0)
Alkaline Phosphatase: 66 U/L (ref 38–126)
Anion gap: 10 (ref 5–15)
BUN: 21 mg/dL (ref 8–23)
CHLORIDE: 104 mmol/L (ref 98–111)
CO2: 24 mmol/L (ref 22–32)
CREATININE: 1.94 mg/dL — AB (ref 0.61–1.24)
Calcium: 9.7 mg/dL (ref 8.9–10.3)
GFR calc non Af Amer: 31 mL/min — ABNORMAL LOW (ref >60)
GFR, EST AFRICAN AMERICAN: 36 mL/min — AB (ref >60)
Glucose, Bld: 90 mg/dL (ref 70–99)
Potassium: 3.7 mmol/L (ref 3.5–5.1)
SODIUM: 138 mmol/L (ref 135–145)
Total Bilirubin: 0.6 mg/dL (ref 0.3–1.2)
Total Protein: 7.6 g/dL (ref 6.5–8.1)

## 2018-09-02 LAB — PSA: Prostatic Specific Antigen: 12.6 ng/mL — ABNORMAL HIGH (ref 0.00–4.00)

## 2018-09-02 MED ORDER — ENZALUTAMIDE 40 MG PO CAPS: 160.0000 mg | ORAL_CAPSULE | Freq: Every day | ORAL | 0 refills | Status: DC

## 2018-09-02 MED ORDER — DENOSUMAB 120 MG/1.7ML ~~LOC~~ SOLN
120.0000 mg | Freq: Once | SUBCUTANEOUS | Status: AC
  Administered 2018-09-02: 120 mg via SUBCUTANEOUS
  Filled 2018-09-02: qty 1.7

## 2018-09-02 NOTE — Patient Instructions (Signed)
[facility] at [facility]  Discharge Instructions:   _______________________________________________________________  Thank you for choosing [facility] at [facility] to provide your oncology and hematology care.  To afford each patient quality time with our providers, please arrive at least 15 minutes before your scheduled appointment.  You need to re-schedule your appointment if you arrive 10 or more minutes late.  We strive to give you quality time with our providers, and arriving late affects you and other patients whose appointments are after yours.  Also, if you no show three or more times for appointments you may be dismissed from the clinic.  Again, thank you for choosing [facility] at [facility]. Our hope is that these requests will allow you access to exceptional care and in a timely manner. _______________________________________________________________  If you have questions after your visit, please contact our office at [phone] between the hours of 8:30 a.m. and 5:00 p.m. Voicemails left after 4:30 p.m. will not be returned until the following business day. _______________________________________________________________  For prescription refill requests, have your pharmacy contact our office. _______________________________________________________________  Recommendations made by the consultant and any test results will be sent to your referring physician. _______________________________________________________________ 

## 2018-09-02 NOTE — Progress Notes (Signed)
[name] presents today for injection per the provider's orders.  Xgeva administration without incident; see MAR for injection details.  Patient tolerated procedure well and without incident.  No questions or complaints noted at this time.  Discharged ambulatory.

## 2018-09-02 NOTE — Progress Notes (Signed)
Diagnosis Prostate cancer metastatic to bone [facility]) - Plan: CBC with Differential/Platelet, Comprehensive metabolic panel, Lactate dehydrogenase, PSA  Staging Cancer Staging No matching staging information was found for the patient.  Assessment and Plan:  1.  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  Pt was previously treated with prostatectomy and salvage radiation therapy. CT chest/abd/pelvis done 12/14/2017 showed  IMPRESSION: Status post prostatectomy.  No findings specific for recurrent or metastatic disease.  Stable compression fracture deformity at T8.  Mild sclerosis along the left iliac bone, unchanged. Attention on follow-up is suggested.  He had bone scan done 03/09/2018 that showed  IMPRESSION: Scattered degenerative type uptake with again identified mild uptake at the T8 vertebral body, corresponding to a chronic T8 compression fracture by CT.  Questionable abnormal uptake versus artifact at the RIGHT humeral diaphysis; dedicated RIGHT humeral radiographs recommended.  No other significant scintigraphic abnormalities identified.  Pt xray of right humerus done 03/17/2018 that was negative.  I have again discussed with him PSA continues to rise and was 8.64 in 06/2018.   Labs done on 03/16/2018 showed a PSA of 3.28.  It had previously been 2 in April 2019.  It is unclear if patient has been compliant with his Lupron injections.  Labs done 09/02/2018 reviewed and showed WBC 6.8 HB 10.8 plts 193,000.  Chemistries WNL with K+ 3.7 Cr 1.94 and normal LFTs.    I have spoken with Dr. Alyson Ingles today.  He reports pt was given 6 month Lupron dose in 06/2018.  He has tried to speak with him regarding Xtandi but pt has not been able to be reached for prescription.  I discussed with Dr. Alyson Ingles we will initiate [name] due to rising PSA.  [name] is indicated in [place] based on results of [place] trial that showed increased overall survival, decrease in progression  radiographically and delay in time to skeletal related events.  Quality of life was also better with xtandi.  Pt will begin therapy with 160 mg po daily.  Rx sent to pharmacy.  Side effects reviewed which include fatigue and diarrhea.  Pt was given written information regarding xtandi.  All question answered.  He will RTC in 1 month for repeat labs.  Will repeat PSA in 11/2018.    2.  Bone Lesions. Patient is here today for Xgeva.  Creatinine 1.94.  Calcium level is normal.  He will proceed with Xgeva as directed.   Bone scan done 03/09/2018 that showed:  IMPRESSION: Scattered degenerative type uptake with again identified mild uptake at the T8 vertebral body, corresponding to a chronic T8 compression fracture by CT.  Questionable abnormal uptake versus artifact at the RIGHT humeral diaphysis; dedicated RIGHT humeral radiographs recommended.  No other significant scintigraphic abnormalities identified.  Pt had xray of right humerus done 03/17/2018 that was negative.  He will continue monthly Xgeva for help to prevent SRE.  Will determine if joint pain improves with beginning [place].   3.  RI.  Cr noted to be increased at 1.94.  Will repeat chemistries in 1 month with xgeva.    4.  Hypertension.  BP is 147/77.    Follow-up with PCP.    25 minutes spent with more than 50% spent in counseling and coordination of care.    BRIEF ONCOLOGIC HISTORY:  (From Dr. Donald Pore last note on 09/03/16)    Current Status: Patient is seen today for follow-up.  He is here for evaluation prior to [facility].  Pt does not know when he  last saw urology.    Problem List Patient Active Problem List   Diagnosis Date Noted  . Tubular adenoma of colon [D12.6] 12/03/2016  . Morbid obesity ([place]) [E66.01] 10/24/2016  . Essential hypertension [I10] 10/24/2016  . HLD (hyperlipidemia) [E78.5] 10/24/2016  . 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 Chronic back pain [M54.9, G89.29]  10/24/2016  . PUD (peptic ulcer disease) [K27.9] 10/24/2016  . GERD (gastroesophageal reflux disease) [K21.9] 10/24/2016  . Hemorrhoids [K64.9] 10/24/2016  . Sleep apnea [G47.30] 10/24/2016  . Uncontrolled daytime somnolence [R40.0] 10/24/2016  . HOH (hard of hearing) [H91.90] 10/24/2016  . Testicular discomfort [N50.819] 05/07/2016  . Prostate cancer metastatic to bone [facility]) [C61, C79.51] 04/04/2016    Past Medical History Past Medical History:  Diagnosis Date  . Cancer [facility])    prostate  . GERD (gastroesophageal reflux disease)   . Hyperlipidemia   . Hypertension   . Prostate cancer metastatic to bone ([place]) 04/04/2016  . Sleep apnea 10/24/2016    Past Surgical History Past Surgical History:  Procedure Laterality Date  . CHOLECYSTECTOMY    . HAND SURGERY    . INGUINAL HERNIA REPAIR Left   . PROSTATECTOMY    . PROSTATECTOMY    . radical prostatectomy    . TONSILLECTOMY      Family History Family History  Problem Relation Age of Onset  . Heart failure Mother        CHF  . Prostate cancer Father   . Narcolepsy Sister   . Cancer Daughter      Social History  reports that he quit smoking about 29 years ago. His smoking use included cigarettes. He has a 32.00 pack-year smoking history. He has never used smokeless tobacco. He reports that he does not drink alcohol or use drugs.  Medications  Current Outpatient Medications:  .  benazepril (LOTENSIN) 20 MG tablet, TAKE ONE TABLET BY MOUTH DAILY., Disp: 90 tablet, Rfl: 3 .  calcium-vitamin D (OSCAL WITH D) 500-200 MG-UNIT TABS tablet, Take 1 tablet by mouth daily., Disp: , Rfl: 3 .  cetirizine (ZYRTEC) 10 MG tablet, TAKE ONE TABLET BY MOUTH DAILY., Disp: 30 tablet, Rfl: 0 .  clotrimazole-betamethasone (LOTRISONE) cream, Apply 1 application topically 2 (two) times daily., Disp: 30 g, Rfl: 0 .  fexofenadine (ALLEGRA) 180 MG tablet, Take 180 mg by mouth daily., Disp: , Rfl: 2 .  furosemide (LASIX) 40 MG tablet, TAKE ONE  TABLET BY MOUTH DAILY. TAKE WITH POTASSIUM., Disp: , Rfl: 5 .  hydrochlorothiazide (HYDRODIURIL) 25 MG tablet, TAKE ONE TABLET BY MOUTH DAILY., Disp: 90 tablet, Rfl: 3 .  losartan (COZAAR) 100 MG tablet, Take 100 mg by mouth daily., Disp: , Rfl: 5 .  meloxicam (MOBIC) 7.5 MG tablet, Take 1 tablet (7.5 mg total) daily by mouth., Disp: 30 tablet, Rfl: 3 .  methylphenidate (RITALIN) 20 MG tablet, Take 20 mg by mouth daily., Disp: , Rfl: 0 .  omeprazole (PRILOSEC) 40 MG capsule, TAKE ONE CAPSULE BY MOUTH DAILY, Disp: 30 capsule, Rfl: 1 .  Potassium Chloride ER 20 MEQ TBCR, Take 1 tablet 2 (two) times daily by mouth., Disp: 60 tablet, Rfl: 5 .  potassium chloride SA (K-DUR,KLOR-CON) 20 MEQ tablet, Take 20 mEq by mouth daily., Disp: , Rfl: 5 .  selenium sulfide (SELSUN) 2.5 % shampoo, Apply 1 application topically daily as needed for irritation., Disp: 118 mL, Rfl: 12 .  tiZANidine (ZANAFLEX) 4 MG tablet, TAKE ONE TABLET BY MOUTH EVERY 6 HOURS  AS NEEDED FOR MUSCLE SPASMS., Disp: , Rfl: 3 .  traMADol (ULTRAM) 50 MG tablet, TAKE ONE TABLET BY MOUTH EVERY 6 HOURS AS NEEDED FOR PAIN, Disp: 50 tablet, Rfl: 0 .  enzalutamide (XTANDI) 40 MG capsule, Take 4 capsules (160 mg total) by mouth daily., Disp: 120 capsule, Rfl: 0  Allergies Patient has no known allergies.  Review of Systems Review of Systems - Oncology ROS negative other than joint pain   Physical Exam  Vitals Wt Readings from Last 3 Encounters:  09/02/18 284 lb (128.8 kg)  04/14/18 291 lb 3.2 oz (132.1 kg)  03/17/18 286 lb 9.6 oz (130 kg)   Temp Readings from Last 3 Encounters:  09/02/18 97.6 F (36.4 C) (Oral)  08/05/18 98.5 F (36.9 C) (Oral)  07/07/18 98.1 F (36.7 C) (Oral)   BP Readings from Last 3 Encounters:  09/02/18 (!) 147/77  08/05/18 (!) 152/75  07/07/18 (!) 152/88   Pulse Readings from Last 3 Encounters:  09/02/18 74  08/05/18 (!) 58  07/07/18 72   Constitutional: Well-developed, well-nourished, and in no  distress.   HENT: Head: Normocephalic and atraumatic.  Mouth/Throat: No oropharyngeal exudate. Mucosa moist. Eyes: Pupils are equal, round, and reactive to light. Conjunctivae are normal. No scleral icterus.  Neck: Normal range of motion. Neck supple. No JVD present.  Cardiovascular: Normal rate, regular rhythm and normal heart sounds.  Exam reveals no gallop and no friction rub.   No murmur heard. Pulmonary/Chest: Effort normal and breath sounds normal. No respiratory distress. No wheezes.No rales.  Abdominal: Soft. Bowel sounds are normal. No distension. There is no tenderness. There is no guarding.  Musculoskeletal: No edema or tenderness.  Lymphadenopathy: No cervical, axillary or supraclavicular adenopathy.  Neurological: Alert and oriented to person, place, and time. No cranial nerve deficit.  Skin: Skin is warm and dry. No rash noted. No erythema. No pallor.  Psychiatric: Affect and judgment normal.   Labs Appointment on 09/02/2018  Component Date Value Ref Range Status  . WBC 09/02/2018 6.8  4.0 - 10.5 K/uL Final  . RBC 09/02/2018 3.90* 4.22 - 5.81 MIL/uL Final  . Hemoglobin 09/02/2018 10.8* 13.0 - 17.0 g/dL Final  . HCT 09/02/2018 33.3* 39.0 - 52.0 % Final  . MCV 09/02/2018 85.4  80.0 - 100.0 fL Final  . MCH 09/02/2018 27.7  26.0 - 34.0 pg Final  . MCHC 09/02/2018 32.4  30.0 - 36.0 g/dL Final  . RDW 09/02/2018 15.7* 11.5 - 15.5 % Final  . Platelets 09/02/2018 193  150 - 400 K/uL Final  . nRBC 09/02/2018 0.0  0.0 - 0.2 % Final  . Neutrophils Relative % 09/02/2018 59  % Final  . Neutro Abs 09/02/2018 4.0  1.7 - 7.7 K/uL Final  . Lymphocytes Relative 09/02/2018 31  % Final  . Lymphs Abs 09/02/2018 2.1  0.7 - 4.0 K/uL Final  . Monocytes Relative 09/02/2018 7  % Final  . Monocytes Absolute 09/02/2018 0.5  0.1 - 1.0 K/uL Final  . Eosinophils Relative 09/02/2018 3  % Final  . Eosinophils Absolute 09/02/2018 0.2  0.0 - 0.5 K/uL Final  . Basophils Relative 09/02/2018 0  % Final    . Basophils Absolute 09/02/2018 0.0  0.0 - 0.1 K/uL Final  . Immature Granulocytes 09/02/2018 0  % Final  . Abs Immature Granulocytes 09/02/2018 0.02  0.00 - 0.07 K/uL Final   Performed at [facility], [address] [place], [place] [address]  . Sodium 09/02/2018 138  135 - 145  mmol/L Final  . Potassium 09/02/2018 3.7  3.5 - 5.1 mmol/L Final  . Chloride 09/02/2018 104  98 - 111 mmol/L Final  . CO2 09/02/2018 24  22 - 32 mmol/L Final  . Glucose, Bld 09/02/2018 90  70 - 99 mg/dL Final  . BUN 09/02/2018 21  8 - 23 mg/dL Final  . Creatinine, Ser 09/02/2018 1.94* 0.61 - 1.24 mg/dL Final  . Calcium 09/02/2018 9.7  8.9 - 10.3 mg/dL Final  . Total Protein 09/02/2018 7.6  6.5 - 8.1 g/dL Final  . Albumin 09/02/2018 4.2  3.5 - 5.0 g/dL Final  . AST 09/02/2018 25  15 - 41 U/L Final  . ALT 09/02/2018 18  0 - 44 U/L Final  . Alkaline Phosphatase 09/02/2018 66  38 - 126 U/L Final  . Total Bilirubin 09/02/2018 0.6  0.3 - 1.2 mg/dL Final  . GFR calc non Af Amer 09/02/2018 31* >60 mL/min Final  . GFR calc Af Amer 09/02/2018 36* >60 mL/min Final   Comment: (NOTE) The eGFR has been calculated using the CKD EPI equation. This calculation has not been validated in all clinical situations. eGFR's persistently <60 mL/min signify possible Chronic Kidney Disease.   Georgiann Hahn gap 09/02/2018 10  5 - 15 Final   Performed at [facility], [address], [place], [place] [address]     Pathology Orders Placed This Encounter  Procedures  . CBC with Differential/Platelet    Standing Status:   Future    Standing Expiration Date:   09/02/2020  . Comprehensive metabolic panel    Standing Status:   Future    Standing Expiration Date:   09/02/2020  . Lactate dehydrogenase    Standing Status:   Future    Standing Expiration Date:   09/02/2020  . PSA    Standing Status:   Future    Standing Expiration Date:   09/02/2020       Zoila Shutter MD

## 2018-09-03 ENCOUNTER — Encounter: Payer: Self-pay | Admitting: Pharmacist

## 2018-09-03 ENCOUNTER — Telehealth (HOSPITAL_COMMUNITY): Payer: Self-pay | Admitting: Pharmacy Technician

## 2018-09-03 ENCOUNTER — Telehealth (HOSPITAL_COMMUNITY): Payer: Self-pay | Admitting: Pharmacist

## 2018-09-03 DIAGNOSIS — C61 Malignant neoplasm of prostate: Secondary | ICD-10-CM

## 2018-09-03 DIAGNOSIS — C7951 Secondary malignant neoplasm of bone: Secondary | ICD-10-CM

## 2018-09-03 MED ORDER — ENZALUTAMIDE 40 MG PO CAPS: 160.0000 mg | ORAL_CAPSULE | Freq: Every day | ORAL | 0 refills | Status: DC

## 2018-09-03 NOTE — Telephone Encounter (Signed)
Oral Oncology Pharmacist Encounter  Received new prescription for Xtandi (enzalutamide) for the treatment of metastatic castrate resistant prostate cancer in conjunction with Lupron injections, planned duration until disease progression or unacceptable drug toxicity.  Prescription dose and frequency assessed.   Current medication list in Epic reviewed, several DDIs with [name] identified: - Xtandi may decrease the concentrations of the following medication: losartan, meloxicam, omprazole, tramadol. Mr. Cinquemani should be monitored for decreased effectiveness of the above mentioned medication. No baseline dose adjustment needed.   Prescription has been e-scribed to the [facility] for benefits analysis and approval.  Oral Oncology Clinic will continue to follow for insurance authorization, copayment issues, initial counseling and start date.  Darl Pikes, PharmD, BCPS, [facility] Hematology/Oncology Clinical Pharmacist [facility] Oral [place] [facility] [phone]  09/03/2018 9:04 AM

## 2018-09-03 NOTE — Telephone Encounter (Signed)
Erroneous encounter

## 2018-09-03 NOTE — Telephone Encounter (Addendum)
Oral Oncology Patient Advocate Encounter  After completing a benefits investigation, [name] does not require prior authorization.  Copay is $1038.83.  No grant funding is available at this time.  We will seek assistance to help patient obtain medication.    [place] Patient [place] Phone [phone] Fax [phone] 09/03/2018 10:17 AM

## 2018-09-06 NOTE — Telephone Encounter (Signed)
Left message on machine to return my call.  Cell phone did not have voicemail set up.

## 2018-09-08 NOTE — Telephone Encounter (Signed)
Left message for pt to return my call.

## 2018-09-09 NOTE — Telephone Encounter (Signed)
Oral Oncology Patient Advocate Encounter  I have been trying to reach the patient since 09/03/18 to talk to him about copay assistance for Xtandi. I left messages on [phone] on 11/8, 11/12, and 11/13.   Today when I called the number has been disconnected.  The [phone] number rings once then states the voicemail has not been set up.  I tried calling the son's home number 11/14 but it just rings, no machine.  Today I called the son's cell phone number and it rings but then says the call cannot be completed at this time.  [place] Patient [place] Phone [phone] Fax [phone] 09/09/2018 2:01 PM

## 2018-09-09 NOTE — Telephone Encounter (Signed)
Tried to call number sister had given [phone]). Voicemail full, could not leave message.

## 2018-09-10 ENCOUNTER — Telehealth (HOSPITAL_COMMUNITY): Payer: Self-pay | Admitting: Pharmacist

## 2018-09-10 NOTE — Telephone Encounter (Signed)
Oral Chemotherapy Pharmacist Encounter  Patient returned our calls and I was able to sign him up for copay assistance. We will reach out to the patient on Monday 09/13/18 for education and to set him up for medication shipment.  Successfully enrolled patient for copayment assistance funds from [employer] from the Prostate Cancer fund.  Award amount: $3,500 Effective dates: 03/14/18 - 09/11/19 ID: [id number] BIN: [id number] Group: [id number] PCN: [id number]  Billing information will be shared with [facility]. I will place a copy of the award letter to be scanned into patient's chart.  Darl Pikes, PharmD, BCPS Hematology/Oncology Clinical Pharmacist [facility] Oral [place] [facility] [phone]  09/10/2018 4:37 PM

## 2018-09-15 ENCOUNTER — Telehealth (HOSPITAL_COMMUNITY): Payer: Self-pay | Admitting: Pharmacist

## 2018-09-15 MED FILL — XTANDI 40 MG CAPSULE: 40 | 30 days supply | Qty: 120 | Fill #0

## 2018-09-15 NOTE — Telephone Encounter (Signed)
Oral Chemotherapy Pharmacist Encounter  Patient will receive his medication in the mail on 09/16/18  Patient Education I spoke with patient for overview of new oral chemotherapy medication: Xtandi (enzalutamide) for the treatment of metastatic castrate resistant prostate cancer in conjunction with Lupron injections, planned duration until disease progression or unacceptable drug toxicity.  Counseled patient on administration, dosing, side effects, monitoring, drug-food interactions, safe handling, storage, and disposal. Patient will take 4 capsules (160 mg total) by mouth daily.  Side effects include but not limited to: fatigue, HTN, hot flashes.    Reviewed with patient importance of keeping a medication schedule and plan for any missed doses.  [name] voiced understanding and appreciation. All questions answered. Medication handout placed in the mail.  Provided patient with Oral [place] [facility] phone number. Patient knows to call the office with questions or concerns. Oral Chemotherapy Navigation Clinic will continue to follow.  Darl Pikes, PharmD, BCPS, [facility] Hematology/Oncology Clinical Pharmacist [facility] Oral [place] [facility] [phone]  09/15/2018 11:21 AM

## 2018-09-15 NOTE — Telephone Encounter (Signed)
Spoke with patient today.  Set up delivery of Xtandi from [facility].  Patient should receive medication 09/16/18.

## 2018-09-17 ENCOUNTER — Other Ambulatory Visit (HOSPITAL_COMMUNITY): Payer: Self-pay

## 2018-09-17 DIAGNOSIS — C7951 Secondary malignant neoplasm of bone: Secondary | ICD-10-CM

## 2018-09-17 DIAGNOSIS — C61 Malignant neoplasm of prostate: Secondary | ICD-10-CM

## 2018-09-20 ENCOUNTER — Telehealth (HOSPITAL_COMMUNITY): Payer: Self-pay | Admitting: Pharmacist

## 2018-09-20 NOTE — Telephone Encounter (Signed)
Oral Chemotherapy Pharmacist Encounter   Patient called and left VM stating he was confused on how to take his Xtandi. I returned his phone call and reviewed with him how to take his medication. Patient is to take 4 capsules (160 mg total) by mouth daily.  I asked Mr. Golson if there was a family member or caregiver that helps him with his medication, and he passed the phone to his son. I reviewed the Xtandi dosing/administartion with him. The both stated their understanding.  Encouraged them to call if they have any questions.  Darl Pikes, PharmD, BCPS, [facility] Hematology/Oncology Clinical Pharmacist [facility] Oral [place] [facility] [phone]  09/20/2018 9:34 AM

## 2018-09-30 ENCOUNTER — Other Ambulatory Visit (HOSPITAL_COMMUNITY): Payer: Medicare Other

## 2018-09-30 ENCOUNTER — Ambulatory Visit (HOSPITAL_COMMUNITY): Payer: Medicare Other

## 2018-10-05 ENCOUNTER — Inpatient Hospital Stay (HOSPITAL_COMMUNITY): Payer: Medicare Other

## 2018-10-05 ENCOUNTER — Inpatient Hospital Stay (HOSPITAL_COMMUNITY): Payer: Medicare Other | Attending: Internal Medicine

## 2018-10-05 VITALS — BP 157/84 | HR 81 | Temp 98.6°F | Resp 18 | Wt 279.0 lb

## 2018-10-05 DIAGNOSIS — C7951 Secondary malignant neoplasm of bone: Secondary | ICD-10-CM | POA: Diagnosis not present

## 2018-10-05 DIAGNOSIS — C61 Malignant neoplasm of prostate: Secondary | ICD-10-CM | POA: Diagnosis present

## 2018-10-05 DIAGNOSIS — Z79899 Other long term (current) drug therapy: Secondary | ICD-10-CM | POA: Insufficient documentation

## 2018-10-05 LAB — COMPREHENSIVE METABOLIC PANEL
ALK PHOS: 67 U/L (ref 38–126)
ALT: 19 U/L (ref 0–44)
AST: 26 U/L (ref 15–41)
Albumin: 4.2 g/dL (ref 3.5–5.0)
Anion gap: 10 (ref 5–15)
BUN: 19 mg/dL (ref 8–23)
CALCIUM: 9.9 mg/dL (ref 8.9–10.3)
CO2: 26 mmol/L (ref 22–32)
CREATININE: 2.27 mg/dL — AB (ref 0.61–1.24)
Chloride: 104 mmol/L (ref 98–111)
GFR calc Af Amer: 31 mL/min — ABNORMAL LOW (ref >60)
GFR, EST NON AFRICAN AMERICAN: 26 mL/min — AB (ref >60)
Glucose, Bld: 86 mg/dL (ref 70–99)
Potassium: 4.1 mmol/L (ref 3.5–5.1)
Sodium: 140 mmol/L (ref 135–145)
Total Bilirubin: 0.6 mg/dL (ref 0.3–1.2)
Total Protein: 7.8 g/dL (ref 6.5–8.1)

## 2018-10-05 MED ORDER — DENOSUMAB 120 MG/1.7ML ~~LOC~~ SOLN
120.0000 mg | Freq: Once | SUBCUTANEOUS | Status: AC
  Administered 2018-10-05: 120 mg via SUBCUTANEOUS
  Filled 2018-10-05: qty 1.7

## 2018-10-05 NOTE — Patient Instructions (Signed)
[facility] _______________________________________________________________  Thank you for choosing  Cancer Center at [facility] to provide your oncology and hematology care.  To afford each patient quality time with our providers, please arrive at least 15 minutes before your scheduled appointment.  You need to re-schedule your appointment if you arrive 10 or more minutes late.  We strive to give you quality time with our providers, and arriving late affects you and other patients whose appointments are after yours.  Also, if you no show three or more times for appointments you may be dismissed from the clinic.  Again, thank you for choosing  Cancer Center at [facility]. Our hope is that these requests will allow you access to exceptional care and in a timely manner. _______________________________________________________________  If you have questions after your visit, please contact our office at [phone] between the hours of 8:30 a.m. and 5:00 p.m. Voicemails left after 4:30 p.m. will not be returned until the following business day. _______________________________________________________________  For prescription refill requests, have your pharmacy contact our office. _______________________________________________________________  Recommendations made by the consultant and any test results will be sent to your referring physician. _______________________________________________________________ 

## 2018-10-05 NOTE — Progress Notes (Signed)
Labs reviewed, Ca 9.9, and pt approved for Xgeva injection today. Pt denies any tooth or jaw pain. No recent or future dental procedures. Pt reports he is taking his Ca and Vit D daily as instructed.  [name] tolerated Xgeva injection without incident or complaint. Discharged self ambulatory in satisfactory condition.

## 2018-10-06 ENCOUNTER — Ambulatory Visit: Payer: Medicare Other | Admitting: Urology

## 2018-10-26 ENCOUNTER — Other Ambulatory Visit (HOSPITAL_COMMUNITY): Payer: Self-pay | Admitting: Urology

## 2018-10-26 ENCOUNTER — Other Ambulatory Visit: Payer: Self-pay | Admitting: Urology

## 2018-10-26 DIAGNOSIS — N135 Crossing vessel and stricture of ureter without hydronephrosis: Secondary | ICD-10-CM

## 2018-10-28 ENCOUNTER — Other Ambulatory Visit (HOSPITAL_COMMUNITY): Payer: Medicare Other

## 2018-10-28 ENCOUNTER — Ambulatory Visit (HOSPITAL_COMMUNITY): Payer: Medicare Other

## 2018-10-28 ENCOUNTER — Other Ambulatory Visit (HOSPITAL_COMMUNITY): Payer: Self-pay | Admitting: Internal Medicine

## 2018-10-28 DIAGNOSIS — C61 Malignant neoplasm of prostate: Secondary | ICD-10-CM

## 2018-10-28 DIAGNOSIS — C7951 Secondary malignant neoplasm of bone: Secondary | ICD-10-CM

## 2018-10-29 ENCOUNTER — Other Ambulatory Visit (HOSPITAL_COMMUNITY): Payer: Self-pay | Admitting: Emergency Medicine

## 2018-10-29 ENCOUNTER — Telehealth (HOSPITAL_COMMUNITY): Payer: Self-pay | Admitting: Pharmacy Technician

## 2018-10-29 DIAGNOSIS — C7951 Secondary malignant neoplasm of bone: Secondary | ICD-10-CM

## 2018-10-29 DIAGNOSIS — C61 Malignant neoplasm of prostate: Secondary | ICD-10-CM

## 2018-10-29 NOTE — Telephone Encounter (Signed)
Oral Oncology Patient Advocate Encounter   Was successful in securing patient an $65 grant from Patient [place] [facility]) to provide copayment coverage for [employer].  This will keep the out of pocket expense at $0.     The billing information is as follows and has been shared with [place].   Member ID: [id number] Group ID: [id number] RxBin: [id number] Dates of Eligibility: 07/31/18 through 10/29/19  [place] Patient [place] Phone [phone] Fax [phone] 10/29/2018 10:43 AM

## 2018-11-01 ENCOUNTER — Other Ambulatory Visit (HOSPITAL_COMMUNITY): Payer: Self-pay

## 2018-11-01 ENCOUNTER — Ambulatory Visit (HOSPITAL_COMMUNITY): Payer: Medicare Other

## 2018-11-01 ENCOUNTER — Encounter (HOSPITAL_COMMUNITY): Payer: Self-pay

## 2018-11-01 DIAGNOSIS — C7951 Secondary malignant neoplasm of bone: Secondary | ICD-10-CM

## 2018-11-01 DIAGNOSIS — C61 Malignant neoplasm of prostate: Secondary | ICD-10-CM

## 2018-11-02 ENCOUNTER — Other Ambulatory Visit: Payer: Self-pay

## 2018-11-02 ENCOUNTER — Encounter (HOSPITAL_COMMUNITY): Payer: Self-pay | Admitting: Internal Medicine

## 2018-11-02 ENCOUNTER — Inpatient Hospital Stay (HOSPITAL_COMMUNITY): Payer: Medicare Other

## 2018-11-02 ENCOUNTER — Inpatient Hospital Stay (HOSPITAL_BASED_OUTPATIENT_CLINIC_OR_DEPARTMENT_OTHER): Payer: Medicare Other | Admitting: Internal Medicine

## 2018-11-02 ENCOUNTER — Inpatient Hospital Stay (HOSPITAL_COMMUNITY): Payer: Medicare Other | Attending: Hematology

## 2018-11-02 VITALS — BP 131/68 | HR 70 | Temp 99.0°F | Resp 18 | Wt 276.4 lb

## 2018-11-02 DIAGNOSIS — Z87891 Personal history of nicotine dependence: Secondary | ICD-10-CM | POA: Diagnosis not present

## 2018-11-02 DIAGNOSIS — C7951 Secondary malignant neoplasm of bone: Secondary | ICD-10-CM | POA: Diagnosis not present

## 2018-11-02 DIAGNOSIS — R9721 Rising PSA following treatment for malignant neoplasm of prostate: Secondary | ICD-10-CM

## 2018-11-02 DIAGNOSIS — Z8042 Family history of malignant neoplasm of prostate: Secondary | ICD-10-CM

## 2018-11-02 DIAGNOSIS — C61 Malignant neoplasm of prostate: Secondary | ICD-10-CM

## 2018-11-02 DIAGNOSIS — I10 Essential (primary) hypertension: Secondary | ICD-10-CM

## 2018-11-02 LAB — COMPREHENSIVE METABOLIC PANEL
ALT: 17 U/L (ref 0–44)
AST: 25 U/L (ref 15–41)
Albumin: 4.1 g/dL (ref 3.5–5.0)
Alkaline Phosphatase: 62 U/L (ref 38–126)
Anion gap: 7 (ref 5–15)
BUN: 19 mg/dL (ref 8–23)
CO2: 25 mmol/L (ref 22–32)
Calcium: 9.8 mg/dL (ref 8.9–10.3)
Chloride: 104 mmol/L (ref 98–111)
Creatinine, Ser: 1.82 mg/dL — ABNORMAL HIGH (ref 0.61–1.24)
GFR calc Af Amer: 40 mL/min — ABNORMAL LOW (ref >60)
GFR calc non Af Amer: 35 mL/min — ABNORMAL LOW (ref >60)
Glucose, Bld: 97 mg/dL (ref 70–99)
Potassium: 4.3 mmol/L (ref 3.5–5.1)
Sodium: 136 mmol/L (ref 135–145)
Total Bilirubin: 0.4 mg/dL (ref 0.3–1.2)
Total Protein: 7.7 g/dL (ref 6.5–8.1)

## 2018-11-02 LAB — LACTATE DEHYDROGENASE: LDH: 173 U/L (ref 98–192)

## 2018-11-02 LAB — CBC WITH DIFFERENTIAL/PLATELET
Abs Immature Granulocytes: 0.03 10*3/uL (ref 0.00–0.07)
BASOS PCT: 1 %
Basophils Absolute: 0 10*3/uL (ref 0.0–0.1)
EOS ABS: 0.3 10*3/uL (ref 0.0–0.5)
EOS PCT: 5 %
HEMATOCRIT: 35 % — AB (ref 39.0–52.0)
Hemoglobin: 10.9 g/dL — ABNORMAL LOW (ref 13.0–17.0)
Immature Granulocytes: 1 %
LYMPHS ABS: 2.1 10*3/uL (ref 0.7–4.0)
Lymphocytes Relative: 35 %
MCH: 27.1 pg (ref 26.0–34.0)
MCHC: 31.1 g/dL (ref 30.0–36.0)
MCV: 87.1 fL (ref 80.0–100.0)
MONOS PCT: 7 %
Monocytes Absolute: 0.4 10*3/uL (ref 0.1–1.0)
Neutro Abs: 3.1 10*3/uL (ref 1.7–7.7)
Neutrophils Relative %: 51 %
Platelets: 181 10*3/uL (ref 150–400)
RBC: 4.02 MIL/uL — ABNORMAL LOW (ref 4.22–5.81)
RDW: 15.7 % — AB (ref 11.5–15.5)
WBC: 6 10*3/uL (ref 4.0–10.5)
nRBC: 0 % (ref 0.0–0.2)

## 2018-11-02 LAB — PSA: Prostatic Specific Antigen: 4.75 ng/mL — ABNORMAL HIGH (ref 0.00–4.00)

## 2018-11-02 MED ORDER — DENOSUMAB 120 MG/1.7ML ~~LOC~~ SOLN
120.0000 mg | Freq: Once | SUBCUTANEOUS | Status: AC
  Administered 2018-11-02: 120 mg via SUBCUTANEOUS
  Filled 2018-11-02: qty 1.7

## 2018-11-02 NOTE — Progress Notes (Signed)
[name] presents today for injection per the provider's orders.  Xgeva administration without incident; see MAR for injection details.  Patient tolerated procedure well and without incident.  No questions or complaints noted at this time.  Discharged ambulatory.

## 2018-11-02 NOTE — Progress Notes (Signed)
Diagnosis Prostate cancer metastatic to bone [facility]) - Plan: CBC with Differential/Platelet, Comprehensive metabolic panel, Lactate dehydrogenase  Staging Cancer Staging No matching staging information was found for the patient.  Assessment and Plan:   1.  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  Pt was previously treated with prostatectomy and salvage radiation therapy. CT chest/abd/pelvis done 12/14/2017 showed  IMPRESSION: Status post prostatectomy.  No findings specific for recurrent or metastatic disease.  Stable compression fracture deformity at T8.  Mild sclerosis along the left iliac bone, unchanged. Attention on follow-up is suggested.  He had bone scan done 03/09/2018 that showed  IMPRESSION: Scattered degenerative type uptake with again identified mild uptake at the T8 vertebral body, corresponding to a chronic T8 compression fracture by CT.  Questionable abnormal uptake versus artifact at the RIGHT humeral diaphysis; dedicated RIGHT humeral radiographs recommended.  No other significant scintigraphic abnormalities identified.  Pt xray of right humerus done 03/17/2018 that was negative.  PSA continued to rise and was 8.64 in 06/2018.   Labs done on 03/16/2018 showed a PSA of 3.28.  It had previously been 2 in April 2019.  It is unclear if patient has been compliant with his Lupron injections.  Labs done 09/02/2018 reviewed and showed WBC 6.8 HB 10.8 plts 193,000.  Chemistries WNL with K+ 3.7 Cr 1.94 and normal LFTs.  PSA 12.6.    Previously, I spoke with Dr. Alyson Ingles.  He reports pt was given 6 month Lupron dose in 06/2018.  He has tried to speak with him regarding Xtandi but pt has not been able to be reached for prescription.    Pt was recommended for Xtandi which is indicated in [place] based on results of [place] trial that showed increased overall survival, decrease in progression radiographically and delay in time to skeletal related events.  Quality of life  was also better with xtandi.  Pt will begin therapy with 160 mg po daily.  Pt reports he has been taking Xtandi as prescribed.  Labs done 11/02/2018 reviewed and showed WBC 6 HB 10.9 plts 181,000.  Chemistries WNL with K+ 4.3 Cr 1.82 and normal LFTs.  Awaiting PSA results.  Pt will RTC in 1 month for labs and Xgeva.     2.  Bone Lesions. Patient is here today for Xgeva.  Creatinine 1.82.  Calcium level is normal.  He will proceed with Xgeva as directed.   Bone scan done 03/09/2018 showed:  IMPRESSION: Scattered degenerative type uptake with again identified mild uptake at the T8 vertebral body, corresponding to a chronic T8 compression fracture by CT.  Questionable abnormal uptake versus artifact at the RIGHT humeral diaphysis; dedicated RIGHT humeral radiographs recommended.  No other significant scintigraphic abnormalities identified.  Pt had xray of right humerus done 03/17/2018 that was negative.  He will continue monthly Xgeva for help to prevent SRE.   3.  RI.  Cr noted to be increased at 1.82.  Pt referred to nephrology for evaluation.    4.  Hypertension.  BP is 131/68.  Follow-up with PCP.    25 minutes spent with more than 50% spent in counseling and coordination of care.    BRIEF ONCOLOGIC HISTORY:  (From Dr. Donald Pore last note on 09/03/16)    Current Status: Patient is seen today for follow-up.  He is here for evaluation prior to [facility].  He reports he last took Xtandi 1 week ago.  He is out of medication.    Problem List Patient Active Problem List  Diagnosis Date Noted  . Tubular adenoma of colon [D12.6] 12/03/2016  . Morbid obesity ([place]) [E66.01] 10/24/2016  . Essential hypertension [I10] 10/24/2016  . HLD (hyperlipidemia) [E78.5] 10/24/2016  . 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 Chronic back pain [M54.9, G89.29] 10/24/2016  . PUD (peptic ulcer disease) [K27.9] 10/24/2016  . GERD (gastroesophageal reflux disease) [K21.9]  10/24/2016  . Hemorrhoids [K64.9] 10/24/2016  . Sleep apnea [G47.30] 10/24/2016  . Uncontrolled daytime somnolence [R40.0] 10/24/2016  . HOH (hard of hearing) [H91.90] 10/24/2016  . Testicular discomfort [N50.819] 05/07/2016  . Prostate cancer metastatic to bone [facility]) [C61, C79.51] 04/04/2016    Past Medical History Past Medical History:  Diagnosis Date  . Cancer [facility])    prostate  . GERD (gastroesophageal reflux disease)   . Hyperlipidemia   . Hypertension   . Prostate cancer metastatic to bone ([place]) 04/04/2016  . Sleep apnea 10/24/2016    Past Surgical History Past Surgical History:  Procedure Laterality Date  . CHOLECYSTECTOMY    . HAND SURGERY    . INGUINAL HERNIA REPAIR Left   . PROSTATECTOMY    . PROSTATECTOMY    . radical prostatectomy    . TONSILLECTOMY      Family History Family History  Problem Relation Age of Onset  . Heart failure Mother        CHF  . Prostate cancer Father   . Narcolepsy Sister   . Cancer Daughter      Social History  reports that he quit smoking about 29 years ago. His smoking use included cigarettes. He has a 32.00 pack-year smoking history. He has never used smokeless tobacco. He reports that he does not drink alcohol or use drugs.  Medications  Current Outpatient Medications:  .  benazepril (LOTENSIN) 20 MG tablet, TAKE ONE TABLET BY MOUTH DAILY., Disp: 90 tablet, Rfl: 3 .  calcium-vitamin D (OSCAL WITH D) 500-200 MG-UNIT TABS tablet, Take 1 tablet by mouth daily., Disp: , Rfl: 3 .  cetirizine (ZYRTEC) 10 MG tablet, TAKE ONE TABLET BY MOUTH DAILY., Disp: 30 tablet, Rfl: 0 .  clotrimazole-betamethasone (LOTRISONE) cream, Apply 1 application topically 2 (two) times daily., Disp: 30 g, Rfl: 0 .  enzalutamide (XTANDI) 40 MG capsule, Take 4 capsules (160 mg total) by mouth daily., Disp: 120 capsule, Rfl: 0 .  furosemide (LASIX) 40 MG tablet, TAKE ONE TABLET BY MOUTH DAILY. TAKE WITH POTASSIUM., Disp: , Rfl: 5 .  hydrochlorothiazide  (HYDRODIURIL) 25 MG tablet, TAKE ONE TABLET BY MOUTH DAILY., Disp: 90 tablet, Rfl: 3 .  losartan (COZAAR) 100 MG tablet, Take 100 mg by mouth daily., Disp: , Rfl: 5 .  meloxicam (MOBIC) 7.5 MG tablet, Take 1 tablet (7.5 mg total) daily by mouth., Disp: 30 tablet, Rfl: 3 .  methylphenidate (RITALIN) 20 MG tablet, Take 20 mg by mouth daily., Disp: , Rfl: 0 .  potassium chloride SA (K-DUR,KLOR-CON) 20 MEQ tablet, Take 20 mEq by mouth daily., Disp: , Rfl: 5 .  selenium sulfide (SELSUN) 2.5 % shampoo, Apply 1 application topically daily as needed for irritation., Disp: 118 mL, Rfl: 12 .  traMADol (ULTRAM) 50 MG tablet, TAKE ONE TABLET BY MOUTH EVERY 6 HOURS AS NEEDED FOR PAIN, Disp: 50 tablet, Rfl: 0 .  fexofenadine (ALLEGRA) 180 MG tablet, Take 180 mg by mouth daily., Disp: , Rfl: 2  Allergies Patient has no known allergies.  Review of Systems Review of Systems - Oncology ROS negative   Physical Exam  Vitals  Wt Readings from Last 3 Encounters:  11/02/18 276 lb 6 oz (125.4 kg)  10/05/18 279 lb (126.6 kg)  09/02/18 284 lb (128.8 kg)   Temp Readings from Last 3 Encounters:  11/02/18 99 F (37.2 C) (Oral)  10/05/18 98.6 F (37 C) (Oral)  09/02/18 97.6 F (36.4 C) (Oral)   BP Readings from Last 3 Encounters:  11/02/18 131/68  10/05/18 (!) 157/84  09/02/18 (!) 147/77   Pulse Readings from Last 3 Encounters:  11/02/18 70  10/05/18 81  09/02/18 74   Constitutional: Well-developed, well-nourished, and in no distress.   HENT: Head: Normocephalic and atraumatic.  Mouth/Throat: No oropharyngeal exudate. Mucosa moist. Eyes: Pupils are equal, round, and reactive to light. Conjunctivae are normal. No scleral icterus.  Neck: Normal range of motion. Neck supple. No JVD present.  Cardiovascular: Normal rate, regular rhythm and normal heart sounds.  Exam reveals no gallop and no friction rub.   No murmur heard. Pulmonary/Chest: Effort normal and breath sounds normal. No respiratory  distress. No wheezes.No rales.  Abdominal: Soft. Bowel sounds are normal. No distension. There is no tenderness. There is no guarding.  Musculoskeletal: No edema or tenderness.  Lymphadenopathy: No cervical, axillary or supraclavicular adenopathy.  Neurological: Alert and oriented to person, place, and time. No cranial nerve deficit.  Skin: Skin is warm and dry. No rash noted. No erythema. No pallor.  Psychiatric: Affect normal.   Labs Appointment on 11/02/2018  Component Date Value Ref Range Status  . WBC 11/02/2018 6.0  4.0 - 10.5 K/uL Final  . RBC 11/02/2018 4.02* 4.22 - 5.81 MIL/uL Final  . Hemoglobin 11/02/2018 10.9* 13.0 - 17.0 g/dL Final  . HCT 11/02/2018 35.0* 39.0 - 52.0 % Final  . MCV 11/02/2018 87.1  80.0 - 100.0 fL Final  . MCH 11/02/2018 27.1  26.0 - 34.0 pg Final  . MCHC 11/02/2018 31.1  30.0 - 36.0 g/dL Final  . RDW 11/02/2018 15.7* 11.5 - 15.5 % Final  . Platelets 11/02/2018 181  150 - 400 K/uL Final  . nRBC 11/02/2018 0.0  0.0 - 0.2 % Final  . Neutrophils Relative % 11/02/2018 51  % Final  . Neutro Abs 11/02/2018 3.1  1.7 - 7.7 K/uL Final  . Lymphocytes Relative 11/02/2018 35  % Final  . Lymphs Abs 11/02/2018 2.1  0.7 - 4.0 K/uL Final  . Monocytes Relative 11/02/2018 7  % Final  . Monocytes Absolute 11/02/2018 0.4  0.1 - 1.0 K/uL Final  . Eosinophils Relative 11/02/2018 5  % Final  . Eosinophils Absolute 11/02/2018 0.3  0.0 - 0.5 K/uL Final  . Basophils Relative 11/02/2018 1  % Final  . Basophils Absolute 11/02/2018 0.0  0.0 - 0.1 K/uL Final  . Immature Granulocytes 11/02/2018 1  % Final  . Abs Immature Granulocytes 11/02/2018 0.03  0.00 - 0.07 K/uL Final   Performed at [facility], [address], [place], [place] [address]  . LDH 11/02/2018 173  98 - 192 U/L Final   Performed at [facility], [address] [place], [place] [address]  . Sodium 11/02/2018 136  135 - 145 mmol/L Final  . Potassium 11/02/2018 4.3  3.5 - 5.1 mmol/L Final  . Chloride 11/02/2018 104   98 - 111 mmol/L Final  . CO2 11/02/2018 25  22 - 32 mmol/L Final  . Glucose, Bld 11/02/2018 97  70 - 99 mg/dL Final  . BUN 11/02/2018 19  8 - 23 mg/dL Final  . Creatinine, Ser 11/02/2018 1.82* 0.61 -  1.24 mg/dL Final  . Calcium 11/02/2018 9.8  8.9 - 10.3 mg/dL Final  . Total Protein 11/02/2018 7.7  6.5 - 8.1 g/dL Final  . Albumin 11/02/2018 4.1  3.5 - 5.0 g/dL Final  . AST 11/02/2018 25  15 - 41 U/L Final  . ALT 11/02/2018 17  0 - 44 U/L Final  . Alkaline Phosphatase 11/02/2018 62  38 - 126 U/L Final  . Total Bilirubin 11/02/2018 0.4  0.3 - 1.2 mg/dL Final  . GFR calc non Af Amer 11/02/2018 35* >60 mL/min Final  . GFR calc Af Amer 11/02/2018 40* >60 mL/min Final  . Anion gap 11/02/2018 7  5 - 15 Final   Performed at [facility], [address], [place], [place] [address]     Pathology Orders Placed This Encounter  Procedures  . CBC with Differential/Platelet    Standing Status:   Future    Standing Expiration Date:   11/03/2019  . Comprehensive metabolic panel    Standing Status:   Future    Standing Expiration Date:   11/03/2019  . Lactate dehydrogenase    Standing Status:   Future    Standing Expiration Date:   11/03/2019       Zoila Shutter MD

## 2018-11-03 ENCOUNTER — Other Ambulatory Visit: Payer: Self-pay | Admitting: *Deleted

## 2018-11-03 ENCOUNTER — Ambulatory Visit: Payer: Medicare Other | Admitting: Urology

## 2018-11-03 ENCOUNTER — Other Ambulatory Visit (HOSPITAL_COMMUNITY): Payer: Self-pay | Admitting: Internal Medicine

## 2018-11-03 DIAGNOSIS — N3941 Urge incontinence: Secondary | ICD-10-CM

## 2018-11-03 DIAGNOSIS — C61 Malignant neoplasm of prostate: Secondary | ICD-10-CM

## 2018-11-03 DIAGNOSIS — C7951 Secondary malignant neoplasm of bone: Secondary | ICD-10-CM

## 2018-11-03 DIAGNOSIS — R5382 Chronic fatigue, unspecified: Secondary | ICD-10-CM | POA: Diagnosis not present

## 2018-11-03 DIAGNOSIS — G3184 Mild cognitive impairment, so stated: Secondary | ICD-10-CM | POA: Diagnosis not present

## 2018-11-03 DIAGNOSIS — L299 Pruritus, unspecified: Secondary | ICD-10-CM | POA: Diagnosis not present

## 2018-11-03 MED ORDER — ENZALUTAMIDE 40 MG PO CAPS: 160.0000 mg | ORAL_CAPSULE | Freq: Every day | ORAL | 0 refills | Status: DC

## 2018-11-03 MED FILL — XTANDI 40 MG CAPSULE: 40 | 30 days supply | Qty: 120 | Fill #0

## 2018-11-03 NOTE — Patient Outreach (Signed)
[facility]) Care Management  11/03/2018  JACORION KLEM Jul 04, 1941 [id number]   Telephone Screen  Referral Date:11/03/2018 Referral Source: MD referral Referral Reason: chronic care management - to revaluate and treat for memory issues.  Insurance: united [employer] care medicare  No [facility] admissions or ED visits in last 6 months   Outreach attempt # [phone]  [phone] mailbox has not been set up [place] reached Mr Tippin on his mobile number [phone]  Patient is able to verify HIPAA Reviewed and addressed the MD referral to [facility] with patient   Mr Louro denies memory issues and informs  CM he was seen by NP Loma Sousa today 11/03/2018 in [place]. He states he generally sees Dr Nevada Crane but reports MD was out of the office.  "There is nothing wrong with me. I take my medications as it says on the bottle. I was trying to get them to change my Ritalin."  He confirms with CM (as written in the copy of the MD 11/03/2018 note in the referral) that he went to the doctor because "my legs were dry and itching. It felt like a bug was crawling on them but there really are no bugs."  CM discussed restless leg syndrome s/s and Mr Marullo stated "Yes like that." He reports he was given medicine to rub on my legs He informed Cm he did go to see the urologist today 11/04/2018  He began to talk about discussing his Ritalin on 11/03/2018 with the provider and wanting to get on the correct brand  He reports that he had noted he was becoming drowsy and sleepy with the Ritalin he was taking before visiting his MD office 11/03/2018 "usually I am lively when I use it?" He informed CM he believes the brand of ritalin may have been changed  He informed Cm he "was 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"  He reports he did go to the pharmacy to get his Ritalin filled and will take as ordered  He reports having a MD follow up appointment in March 2020   [facility] RN CM reviewed [facility] services in  detail to include [facility] community RN CM, SW and pharmacy roles CM offered services of [facility] community RN CM but Mr [place] refused all [facility] services   Cm requested to speak with a possible family member to also review the referral and was informed by Mr Yawn that "no one is here right now. I have my son."  Cm encouraged Mr Schwer to have his son to possibly return a call to CM but he informed CM " There ain't nothing wrong with me. I don't need no one to visit me about my medicines. I take them every morning" CM discussed with Mr Dombek that CM would contact Dr Nevada Crane office to discuss the contact made with him and to update the office staff prn    Social: Mr Powe is widowed and lives with his son (pt reports son not at home today 11/03/2018). He states he is independent in all his care needs and drives himself to medical appointments    Conditions: HTN, anemia, HDL, iron deficiency anemia, prostate cancer metastatic to bone, sleep apnea, GERD, HOH, DJD of lumbar spine, HLD, uncontrolled daytime somnolence  Medications: He denies concerns with taking medications as prescribed, affording medications, side effects of medications and questions about medications    Appointments: He reports his next primary care MD appointment is in March 2020  Advance Directives: He Denies need for assist with advance directives     Consent: [facility] RN CM reviewed [facility] services with patient. Patient gave verbal consent for services.  Plan: [facility] RN CM will close case at this time as patient has been assessed and he denies any needs and refuses all [facility] services   [facility] RN CM sent a successful outreach letter as discussed with [facility] brochure enclosed for review  Pt encouraged to return a call to [facility] RN CM prn  Routed not to MD listed in epic care team  Brooks L. Lavina Hamman, RN, BSN, [place] Coordinator Office number [phone] Mobile number [phone]  Main [facility] number  [phone] Fax number [phone]

## 2018-11-05 ENCOUNTER — Ambulatory Visit (HOSPITAL_COMMUNITY)
Admission: RE | Admit: 2018-11-05 | Discharge: 2018-11-05 | Disposition: A | Discharge status: Home / Self Care | Payer: Medicare Other | Source: Ambulatory Visit | Attending: Urology | Admitting: Urology

## 2018-11-05 DIAGNOSIS — N135 Crossing vessel and stricture of ureter without hydronephrosis: Secondary | ICD-10-CM | POA: Diagnosis present

## 2018-11-05 DIAGNOSIS — N132 Hydronephrosis with renal and ureteral calculous obstruction: Secondary | ICD-10-CM | POA: Diagnosis not present

## 2018-11-05 DIAGNOSIS — N1330 Unspecified hydronephrosis: Secondary | ICD-10-CM | POA: Insufficient documentation

## 2018-11-05 DIAGNOSIS — N281 Cyst of kidney, acquired: Secondary | ICD-10-CM | POA: Diagnosis not present

## 2018-11-12 DIAGNOSIS — R5382 Chronic fatigue, unspecified: Secondary | ICD-10-CM | POA: Diagnosis not present

## 2018-11-12 DIAGNOSIS — M545 Low back pain: Secondary | ICD-10-CM | POA: Diagnosis not present

## 2018-11-12 DIAGNOSIS — G3184 Mild cognitive impairment, so stated: Secondary | ICD-10-CM | POA: Diagnosis not present

## 2018-11-12 DIAGNOSIS — M79602 Pain in left arm: Secondary | ICD-10-CM | POA: Diagnosis not present

## 2018-11-18 DIAGNOSIS — I10 Essential (primary) hypertension: Secondary | ICD-10-CM | POA: Diagnosis not present

## 2018-11-18 DIAGNOSIS — N183 Chronic kidney disease, stage 3 (moderate): Secondary | ICD-10-CM | POA: Diagnosis not present

## 2018-11-18 DIAGNOSIS — C7951 Secondary malignant neoplasm of bone: Secondary | ICD-10-CM | POA: Diagnosis not present

## 2018-11-25 ENCOUNTER — Ambulatory Visit (HOSPITAL_COMMUNITY): Payer: Medicare Other

## 2018-11-25 ENCOUNTER — Other Ambulatory Visit (HOSPITAL_COMMUNITY): Payer: Medicare Other

## 2018-11-25 DIAGNOSIS — E559 Vitamin D deficiency, unspecified: Secondary | ICD-10-CM | POA: Diagnosis not present

## 2018-11-25 DIAGNOSIS — I10 Essential (primary) hypertension: Secondary | ICD-10-CM | POA: Diagnosis not present

## 2018-11-25 DIAGNOSIS — N189 Chronic kidney disease, unspecified: Secondary | ICD-10-CM | POA: Diagnosis not present

## 2018-11-25 DIAGNOSIS — Z1159 Encounter for screening for other viral diseases: Secondary | ICD-10-CM | POA: Diagnosis not present

## 2018-11-25 DIAGNOSIS — Z79899 Other long term (current) drug therapy: Secondary | ICD-10-CM | POA: Diagnosis not present

## 2018-11-25 DIAGNOSIS — D649 Anemia, unspecified: Secondary | ICD-10-CM | POA: Diagnosis not present

## 2018-11-30 ENCOUNTER — Inpatient Hospital Stay (HOSPITAL_COMMUNITY): Payer: Medicare Other | Attending: Internal Medicine | Admitting: Internal Medicine

## 2018-11-30 ENCOUNTER — Other Ambulatory Visit (HOSPITAL_COMMUNITY): Payer: Medicare Other

## 2018-11-30 ENCOUNTER — Ambulatory Visit (HOSPITAL_COMMUNITY): Payer: Medicare Other

## 2018-11-30 DIAGNOSIS — C7951 Secondary malignant neoplasm of bone: Secondary | ICD-10-CM | POA: Insufficient documentation

## 2018-11-30 DIAGNOSIS — I10 Essential (primary) hypertension: Secondary | ICD-10-CM | POA: Insufficient documentation

## 2018-11-30 DIAGNOSIS — C61 Malignant neoplasm of prostate: Secondary | ICD-10-CM | POA: Insufficient documentation

## 2018-11-30 DIAGNOSIS — Z87891 Personal history of nicotine dependence: Secondary | ICD-10-CM | POA: Insufficient documentation

## 2018-11-30 DIAGNOSIS — Z9079 Acquired absence of other genital organ(s): Secondary | ICD-10-CM | POA: Insufficient documentation

## 2018-11-30 DIAGNOSIS — Z79899 Other long term (current) drug therapy: Secondary | ICD-10-CM | POA: Insufficient documentation

## 2018-12-01 ENCOUNTER — Inpatient Hospital Stay (HOSPITAL_BASED_OUTPATIENT_CLINIC_OR_DEPARTMENT_OTHER): Payer: Medicare Other

## 2018-12-01 ENCOUNTER — Inpatient Hospital Stay (HOSPITAL_BASED_OUTPATIENT_CLINIC_OR_DEPARTMENT_OTHER): Payer: Medicare Other | Admitting: Internal Medicine

## 2018-12-01 ENCOUNTER — Inpatient Hospital Stay (HOSPITAL_COMMUNITY): Payer: Medicare Other

## 2018-12-01 VITALS — BP 151/73 | HR 69 | Temp 97.6°F | Resp 18 | Wt 272.0 lb

## 2018-12-01 DIAGNOSIS — I10 Essential (primary) hypertension: Secondary | ICD-10-CM

## 2018-12-01 DIAGNOSIS — C61 Malignant neoplasm of prostate: Secondary | ICD-10-CM | POA: Diagnosis not present

## 2018-12-01 DIAGNOSIS — N289 Disorder of kidney and ureter, unspecified: Secondary | ICD-10-CM

## 2018-12-01 DIAGNOSIS — C7951 Secondary malignant neoplasm of bone: Secondary | ICD-10-CM

## 2018-12-01 DIAGNOSIS — Z79899 Other long term (current) drug therapy: Secondary | ICD-10-CM | POA: Diagnosis not present

## 2018-12-01 DIAGNOSIS — Z9079 Acquired absence of other genital organ(s): Secondary | ICD-10-CM | POA: Diagnosis not present

## 2018-12-01 DIAGNOSIS — Z87891 Personal history of nicotine dependence: Secondary | ICD-10-CM | POA: Diagnosis not present

## 2018-12-01 LAB — COMPREHENSIVE METABOLIC PANEL
ALT: 18 U/L (ref 0–44)
AST: 28 U/L (ref 15–41)
Albumin: 4.1 g/dL (ref 3.5–5.0)
Alkaline Phosphatase: 54 U/L (ref 38–126)
Anion gap: 8 (ref 5–15)
BUN: 20 mg/dL (ref 8–23)
CO2: 26 mmol/L (ref 22–32)
Calcium: 9.4 mg/dL (ref 8.9–10.3)
Chloride: 103 mmol/L (ref 98–111)
Creatinine, Ser: 1.89 mg/dL — ABNORMAL HIGH (ref 0.61–1.24)
GFR, EST AFRICAN AMERICAN: 38 mL/min — AB (ref >60)
GFR, EST NON AFRICAN AMERICAN: 33 mL/min — AB (ref >60)
Glucose, Bld: 98 mg/dL (ref 70–99)
Potassium: 4.3 mmol/L (ref 3.5–5.1)
Sodium: 137 mmol/L (ref 135–145)
TOTAL PROTEIN: 7.7 g/dL (ref 6.5–8.1)
Total Bilirubin: 0.4 mg/dL (ref 0.3–1.2)

## 2018-12-01 LAB — CBC WITH DIFFERENTIAL/PLATELET
Abs Immature Granulocytes: 0.01 10*3/uL (ref 0.00–0.07)
Basophils Absolute: 0 10*3/uL (ref 0.0–0.1)
Basophils Relative: 1 %
Eosinophils Absolute: 0.3 10*3/uL (ref 0.0–0.5)
Eosinophils Relative: 4 %
HEMATOCRIT: 34.9 % — AB (ref 39.0–52.0)
Hemoglobin: 10.9 g/dL — ABNORMAL LOW (ref 13.0–17.0)
Immature Granulocytes: 0 %
LYMPHS ABS: 2 10*3/uL (ref 0.7–4.0)
Lymphocytes Relative: 35 %
MCH: 27.1 pg (ref 26.0–34.0)
MCHC: 31.2 g/dL (ref 30.0–36.0)
MCV: 86.8 fL (ref 80.0–100.0)
Monocytes Absolute: 0.4 10*3/uL (ref 0.1–1.0)
Monocytes Relative: 6 %
Neutro Abs: 3 10*3/uL (ref 1.7–7.7)
Neutrophils Relative %: 54 %
Platelets: 209 10*3/uL (ref 150–400)
RBC: 4.02 MIL/uL — ABNORMAL LOW (ref 4.22–5.81)
RDW: 15.8 % — ABNORMAL HIGH (ref 11.5–15.5)
WBC: 5.7 10*3/uL (ref 4.0–10.5)
nRBC: 0 % (ref 0.0–0.2)

## 2018-12-01 LAB — PSA: Prostatic Specific Antigen: 3.47 ng/mL (ref 0.00–4.00)

## 2018-12-01 LAB — LACTATE DEHYDROGENASE: LDH: 172 U/L (ref 98–192)

## 2018-12-01 MED ORDER — DENOSUMAB 120 MG/1.7ML ~~LOC~~ SOLN
120.0000 mg | Freq: Once | SUBCUTANEOUS | Status: AC
  Administered 2018-12-01: 120 mg via SUBCUTANEOUS
  Filled 2018-12-01: qty 1.7

## 2018-12-01 MED ORDER — ENZALUTAMIDE 40 MG PO CAPS: 160.0000 mg | ORAL_CAPSULE | Freq: Every day | ORAL | 0 refills | Status: DC

## 2018-12-01 NOTE — Progress Notes (Signed)
Diagnosis Prostate cancer metastatic to bone [facility]) - Plan: SCHEDULING COMMUNICATION, denosumab (XGEVA) injection 120 mg, CBC with Differential/Platelet, Comprehensive metabolic panel, Lactate dehydrogenase, PSA  Staging Cancer Staging No matching staging information was found for the patient.  Assessment and Plan:   1.  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  Pt was previously treated with prostatectomy and salvage radiation therapy. CT chest/abd/pelvis done 12/14/2017 showed  IMPRESSION: Status post prostatectomy.  No findings specific for recurrent or metastatic disease.  Stable compression fracture deformity at T8.  Mild sclerosis along the left iliac bone, unchanged. Attention on follow-up is suggested.  He had bone scan done 03/09/2018 that showed  IMPRESSION: Scattered degenerative type uptake with again identified mild uptake at the T8 vertebral body, corresponding to a chronic T8 compression fracture by CT.  Questionable abnormal uptake versus artifact at the RIGHT humeral diaphysis; dedicated RIGHT humeral radiographs recommended.  No other significant scintigraphic abnormalities identified.  Pt xray of right humerus done 03/17/2018 that was negative.  PSA continued to rise and was 8.64 in 06/2018.   Labs done on 03/16/2018 showed a PSA of 3.28.  It had previously been 2 in April 2019.  It is unclear if patient has been compliant with his Lupron injections.  Labs done 09/02/2018 reviewed and showed WBC 6.8 HB 10.8 plts 193,000.  Chemistries WNL with K+ 3.7 Cr 1.94 and normal LFTs.  PSA 12.6.    Previously, I spoke with Dr. Alyson Ingles.  He reports pt was given 6 month Lupron dose in 06/2018.  He has tried to speak with him regarding Xtandi but pt has not been able to be reached for prescription.    Pt was recommended for Xtandi which is indicated in [place] based on results of [place] trial that showed increased overall survival, decrease in progression  radiographically and delay in time to skeletal related events.  Quality of life was also better with xtandi.  Pt was recommended for Xtandi 160 mg po daily.  Pt reports he has been taking Xtandi as prescribed and is almost out of medication.    Labs done 12/01/2018 reviewed and showed WBC 5.7   HB 10.9   plts 209,000.  Chemistries WNL with K+ 4.3 Cr 1.89 and normal LFTs.  PSA improved 4.75 and was previously 13 in November 2019.  Pt will RTC in 02/2019 for follow-up with labs.  He will continue monthly Xgeva.  Rx for [facility] sent to [facility]    2.  Bone Lesions. Patient is here today for Xgeva.  Creatinine 1.89.  Calcium level is normal.  He will proceed with Xgeva as directed.   Bone scan done 03/09/2018 showed:  IMPRESSION: Scattered degenerative type uptake with again identified mild uptake at the T8 vertebral body, corresponding to a chronic T8 compression fracture by CT.  Questionable abnormal uptake versus artifact at the RIGHT humeral diaphysis; dedicated RIGHT humeral radiographs recommended.  No other significant scintigraphic abnormalities identified.  Pt had xray of right humerus done 03/17/2018 that was negative.  He will continue monthly Xgeva for help to prevent SRE.   3.  RI.  Cr noted to be increased at 1.89.  Pt previously referred to nephrology for evaluation.    4.  Hypertension.  BP is 151/73.  Follow-up with PCP.    5.  Compliance with follow-up and ? Bed bugs.  Will ask for social work evaluation to determine if cognitive assessment needed as well as evaluation of current living situation.  25 minutes spent with more than 50% spent in counseling and coordination of care.    BRIEF ONCOLOGIC HISTORY:  (From Dr. Donald Pore last note on 09/03/16)    Current Status: Patient is seen today for follow-up.  He is here for evaluation prior to [facility].  He reports he is almost out of [place].     Problem List Patient Active Problem List   Diagnosis Date Noted  .  Tubular adenoma of colon [D12.6] 12/03/2016  . Morbid obesity ([place]) [E66.01] 10/24/2016  . Essential hypertension [I10] 10/24/2016  . HLD (hyperlipidemia) [E78.5] 10/24/2016  . 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 Chronic back pain [M54.9, G89.29] 10/24/2016  . PUD (peptic ulcer disease) [K27.9] 10/24/2016  . GERD (gastroesophageal reflux disease) [K21.9] 10/24/2016  . Hemorrhoids [K64.9] 10/24/2016  . Sleep apnea [G47.30] 10/24/2016  . Uncontrolled daytime somnolence [R40.0] 10/24/2016  . HOH (hard of hearing) [H91.90] 10/24/2016  . Testicular discomfort [N50.819] 05/07/2016  . Prostate cancer metastatic to bone [facility]) [C61, C79.51] 04/04/2016    Past Medical History Past Medical History:  Diagnosis Date  . Cancer [facility])    prostate  . GERD (gastroesophageal reflux disease)   . Hyperlipidemia   . Hypertension   . Prostate cancer metastatic to bone ([place]) 04/04/2016  . Sleep apnea 10/24/2016    Past Surgical History Past Surgical History:  Procedure Laterality Date  . CHOLECYSTECTOMY    . HAND SURGERY    . INGUINAL HERNIA REPAIR Left   . PROSTATECTOMY    . PROSTATECTOMY    . radical prostatectomy    . TONSILLECTOMY      Family History Family History  Problem Relation Age of Onset  . Heart failure Mother        CHF  . Prostate cancer Father   . Narcolepsy Sister   . Cancer Daughter      Social History  reports that he quit smoking about 29 years ago. His smoking use included cigarettes. He has a 32.00 pack-year smoking history. He has never used smokeless tobacco. He reports that he does not drink alcohol or use drugs.  Medications  Current Outpatient Medications:  .  benazepril (LOTENSIN) 20 MG tablet, TAKE ONE TABLET BY MOUTH DAILY., Disp: 90 tablet, Rfl: 3 .  calcium-vitamin D (OSCAL WITH D) 500-200 MG-UNIT TABS tablet, Take 1 tablet by mouth daily., Disp: , Rfl: 3 .  cetirizine (ZYRTEC) 10 MG tablet, TAKE ONE TABLET BY MOUTH  DAILY., Disp: 30 tablet, Rfl: 0 .  clotrimazole-betamethasone (LOTRISONE) cream, Apply 1 application topically 2 (two) times daily., Disp: 30 g, Rfl: 0 .  enzalutamide (XTANDI) 40 MG capsule, Take 4 capsules (160 mg total) by mouth daily., Disp: 120 capsule, Rfl: 0 .  fexofenadine (ALLEGRA) 180 MG tablet, Take 180 mg by mouth daily., Disp: , Rfl: 2 .  furosemide (LASIX) 40 MG tablet, TAKE ONE TABLET BY MOUTH DAILY. TAKE WITH POTASSIUM., Disp: , Rfl: 5 .  hydrochlorothiazide (HYDRODIURIL) 25 MG tablet, TAKE ONE TABLET BY MOUTH DAILY., Disp: 90 tablet, Rfl: 3 .  losartan (COZAAR) 100 MG tablet, Take 100 mg by mouth daily., Disp: , Rfl: 5 .  meloxicam (MOBIC) 7.5 MG tablet, Take 1 tablet (7.5 mg total) daily by mouth., Disp: 30 tablet, Rfl: 3 .  methylphenidate (RITALIN) 20 MG tablet, Take 20 mg by mouth daily., Disp: , Rfl: 0 .  potassium chloride SA (K-DUR,KLOR-CON) 20 MEQ tablet, Take 20 mEq by mouth daily., Disp: , Rfl: 5 .  selenium sulfide (SELSUN) 2.5 % shampoo, Apply 1 application topically daily as needed for irritation., Disp: 118 mL, Rfl: 12 .  traMADol (ULTRAM) 50 MG tablet, TAKE ONE TABLET BY MOUTH EVERY 6 HOURS AS NEEDED FOR PAIN, Disp: 50 tablet, Rfl: 0  Allergies Patient has no known allergies.  Review of Systems Review of Systems - Oncology ROS negative   Physical Exam  Vitals Wt Readings from Last 3 Encounters:  11/02/18 276 lb 6 oz (125.4 kg)  10/05/18 279 lb (126.6 kg)  09/02/18 284 lb (128.8 kg)   Temp Readings from Last 3 Encounters:  11/02/18 99 F (37.2 C) (Oral)  10/05/18 98.6 F (37 C) (Oral)  09/02/18 97.6 F (36.4 C) (Oral)   BP Readings from Last 3 Encounters:  11/02/18 131/68  10/05/18 (!) 157/84  09/02/18 (!) 147/77   Pulse Readings from Last 3 Encounters:  11/02/18 70  10/05/18 81  09/02/18 74   Vitals T 97.6  HR 69 RR 18 BP 151/73  Pulse ox 99% on room air.   Constitutional: Well-developed, well-nourished, and in no distress.   HENT:  Head: Normocephalic and atraumatic.  Mouth/Throat: No oropharyngeal exudate. Mucosa moist. Eyes: Pupils are equal, round, and reactive to light. Conjunctivae are normal. No scleral icterus.  Neck: Normal range of motion. Neck supple. No JVD present.  Cardiovascular: Normal rate, regular rhythm and normal heart sounds.  Exam reveals no gallop and no friction rub.   No murmur heard. Pulmonary/Chest: Effort normal and breath sounds normal. No respiratory distress. No wheezes.No rales.  Abdominal: Soft. Bowel sounds are normal. No distension. There is no tenderness. There is no guarding.  Musculoskeletal: No edema or tenderness.  Lymphadenopathy: No cervical, axillary or supraclavicular adenopathy.  Neurological: Alert and oriented to person, place, and time. No cranial nerve deficit.  Skin: Skin is warm and dry. No rash noted. No erythema. No pallor.  Psychiatric: Affect and judgment normal.   Labs Appointment on 12/01/2018  Component Date Value Ref Range Status  . WBC 12/01/2018 5.7  4.0 - 10.5 K/uL Final  . RBC 12/01/2018 4.02* 4.22 - 5.81 MIL/uL Final  . Hemoglobin 12/01/2018 10.9* 13.0 - 17.0 g/dL Final  . HCT 12/01/2018 34.9* 39.0 - 52.0 % Final  . MCV 12/01/2018 86.8  80.0 - 100.0 fL Final  . MCH 12/01/2018 27.1  26.0 - 34.0 pg Final  . MCHC 12/01/2018 31.2  30.0 - 36.0 g/dL Final  . RDW 12/01/2018 15.8* 11.5 - 15.5 % Final  . Platelets 12/01/2018 209  150 - 400 K/uL Final  . nRBC 12/01/2018 0.0  0.0 - 0.2 % Final  . Neutrophils Relative % 12/01/2018 54  % Final  . Neutro Abs 12/01/2018 3.0  1.7 - 7.7 K/uL Final  . Lymphocytes Relative 12/01/2018 35  % Final  . Lymphs Abs 12/01/2018 2.0  0.7 - 4.0 K/uL Final  . Monocytes Relative 12/01/2018 6  % Final  . Monocytes Absolute 12/01/2018 0.4  0.1 - 1.0 K/uL Final  . Eosinophils Relative 12/01/2018 4  % Final  . Eosinophils Absolute 12/01/2018 0.3  0.0 - 0.5 K/uL Final  . Basophils Relative 12/01/2018 1  % Final  . Basophils  Absolute 12/01/2018 0.0  0.0 - 0.1 K/uL Final  . Immature Granulocytes 12/01/2018 0  % Final  . Abs Immature Granulocytes 12/01/2018 0.01  0.00 - 0.07 K/uL Final   Performed at [facility], [address] [place], [place] [address]  . Sodium 12/01/2018 137  135 - 145  mmol/L Final  . Potassium 12/01/2018 4.3  3.5 - 5.1 mmol/L Final  . Chloride 12/01/2018 103  98 - 111 mmol/L Final  . CO2 12/01/2018 26  22 - 32 mmol/L Final  . Glucose, Bld 12/01/2018 98  70 - 99 mg/dL Final  . BUN 12/01/2018 20  8 - 23 mg/dL Final  . Creatinine, Ser 12/01/2018 1.89* 0.61 - 1.24 mg/dL Final  . Calcium 12/01/2018 9.4  8.9 - 10.3 mg/dL Final  . Total Protein 12/01/2018 7.7  6.5 - 8.1 g/dL Final  . Albumin 12/01/2018 4.1  3.5 - 5.0 g/dL Final  . AST 12/01/2018 28  15 - 41 U/L Final  . ALT 12/01/2018 18  0 - 44 U/L Final  . Alkaline Phosphatase 12/01/2018 54  38 - 126 U/L Final  . Total Bilirubin 12/01/2018 0.4  0.3 - 1.2 mg/dL Final  . GFR calc non Af Amer 12/01/2018 33* >60 mL/min Final  . GFR calc Af Amer 12/01/2018 38* >60 mL/min Final  . Anion gap 12/01/2018 8  5 - 15 Final   Performed at [facility], [address], [place], [place] [address]  . LDH 12/01/2018 172  98 - 192 U/L Final   Performed at [facility], [address], [place], [place] [address]     Pathology Orders Placed This Encounter  Procedures  . CBC with Differential/Platelet    Standing Status:   Future    Standing Expiration Date:   12/02/2019  . Comprehensive metabolic panel    Standing Status:   Future    Standing Expiration Date:   12/02/2019  . Lactate dehydrogenase    Standing Status:   Future    Standing Expiration Date:   12/02/2019  . PSA    Standing Status:   Future    Standing Expiration Date:   12/02/2019  . SCHEDULING COMMUNICATION    Schedule 15 minute injection appointment       Zoila Shutter MD

## 2018-12-01 NOTE — Progress Notes (Signed)
Pt here today for Xgeva injection. Pt given injection in right lower abdomen. Pt tolerated injection well. Pt stable and discharged home ambulatory. Pt to return as scheduled for follow up injection.

## 2018-12-01 NOTE — Progress Notes (Signed)
Everything documented in office visit encounter.

## 2018-12-10 ENCOUNTER — Telehealth (HOSPITAL_COMMUNITY): Payer: Self-pay | Admitting: Pharmacist

## 2018-12-10 MED FILL — XTANDI 40 MG CAPSULE: 40 | 30 days supply | Qty: 120 | Fill #0

## 2018-12-10 NOTE — Telephone Encounter (Signed)
Oral Chemotherapy Pharmacist Encounter  Follow-Up Form  Called patient today to follow up regarding patient's oral chemotherapy medication: Xtandi (enzalutamide)  Original Start date of oral chemotherapy: 08/2018  The pharmacy and our clinic have made many call attempts to [name] to complete his Xtandi medication refill. He was able to be reached today. Due to trouble reaching the patient, he reported he was out of medication. Not able to determine how long her was out of medication. Reviewed that the pharmacy will need to talk with him before they can send his medication. Encouraged him to call the pharmacy if he is running low on his [name] so he does not run out.   Pt reports the following side effects: None reported  Recent labs reviewed: PSA from 12/01/2018  New medications?: None reported  Other Issues: None reported  Patient knows to call the office with questions or concerns. Oral Oncology Clinic will continue to follow.  Darl Pikes, PharmD, BCPS, [facility] Hematology/Oncology Clinical Pharmacist [facility]/HP/AP Oral [place] [phone]  12/10/2018 2:41 PM

## 2018-12-23 ENCOUNTER — Ambulatory Visit (HOSPITAL_COMMUNITY): Payer: Medicare Other | Admitting: Hematology

## 2018-12-23 ENCOUNTER — Ambulatory Visit (HOSPITAL_COMMUNITY): Payer: Medicare Other

## 2018-12-23 ENCOUNTER — Other Ambulatory Visit (HOSPITAL_COMMUNITY): Payer: Medicare Other

## 2018-12-28 ENCOUNTER — Ambulatory Visit (HOSPITAL_COMMUNITY): Payer: Medicare Other | Admitting: Hematology

## 2018-12-28 ENCOUNTER — Ambulatory Visit (HOSPITAL_COMMUNITY): Payer: Medicare Other

## 2018-12-28 ENCOUNTER — Other Ambulatory Visit (HOSPITAL_COMMUNITY): Payer: Medicare Other

## 2018-12-30 ENCOUNTER — Other Ambulatory Visit: Payer: Self-pay

## 2018-12-30 ENCOUNTER — Inpatient Hospital Stay (HOSPITAL_COMMUNITY): Payer: Medicare Other

## 2018-12-30 ENCOUNTER — Inpatient Hospital Stay (HOSPITAL_COMMUNITY): Payer: Medicare Other | Attending: Hematology

## 2018-12-30 VITALS — BP 139/67 | HR 66 | Temp 98.6°F | Resp 18

## 2018-12-30 DIAGNOSIS — C7951 Secondary malignant neoplasm of bone: Secondary | ICD-10-CM

## 2018-12-30 DIAGNOSIS — C61 Malignant neoplasm of prostate: Secondary | ICD-10-CM

## 2018-12-30 LAB — COMPREHENSIVE METABOLIC PANEL
ALT: 14 U/L (ref 0–44)
AST: 23 U/L (ref 15–41)
Albumin: 4.1 g/dL (ref 3.5–5.0)
Alkaline Phosphatase: 70 U/L (ref 38–126)
Anion gap: 7 (ref 5–15)
BUN: 19 mg/dL (ref 8–23)
CALCIUM: 9.4 mg/dL (ref 8.9–10.3)
CO2: 26 mmol/L (ref 22–32)
CREATININE: 1.75 mg/dL — AB (ref 0.61–1.24)
Chloride: 103 mmol/L (ref 98–111)
GFR calc Af Amer: 42 mL/min — ABNORMAL LOW (ref >60)
GFR calc non Af Amer: 36 mL/min — ABNORMAL LOW (ref >60)
Glucose, Bld: 100 mg/dL — ABNORMAL HIGH (ref 70–99)
Potassium: 4.3 mmol/L (ref 3.5–5.1)
Sodium: 136 mmol/L (ref 135–145)
Total Bilirubin: 0.5 mg/dL (ref 0.3–1.2)
Total Protein: 7.8 g/dL (ref 6.5–8.1)

## 2018-12-30 MED ORDER — DENOSUMAB 120 MG/1.7ML ~~LOC~~ SOLN
120.0000 mg | Freq: Once | SUBCUTANEOUS | Status: AC
  Administered 2018-12-30: 120 mg via SUBCUTANEOUS
  Filled 2018-12-30: qty 1.7

## 2018-12-30 NOTE — Progress Notes (Signed)
[name] tolerated Xgeva injection without incident or complaint. VSS. Discharged self ambulatory in satisfactory condition.

## 2018-12-30 NOTE — Patient Instructions (Signed)
[facility] _______________________________________________________________  Thank you for choosing [facility] to provide your oncology and hematology care.  To afford each patient quality time with our providers, please arrive at least 15 minutes before your scheduled appointment.  You need to re-schedule your appointment if you arrive 10 or more minutes late.  We strive to give you quality time with our providers, and arriving late affects you and other patients whose appointments are after yours.  Also, if you no show three or more times for appointments you may be dismissed from the clinic.  Again, thank you for choosing [facility]. Our hope is that these requests will allow you access to exceptional care and in a timely manner. _______________________________________________________________  If you have questions after your visit, please contact our office at [phone] between the hours of 8:30 a.m. and 5:00 p.m. Voicemails left after 4:30 p.m. will not be returned until the following business day. _______________________________________________________________  For prescription refill requests, have your pharmacy contact our office. _______________________________________________________________  Recommendations made by the consultant and any test results will be sent to your referring physician. _______________________________________________________________ 

## 2019-01-06 DIAGNOSIS — H43812 Vitreous degeneration, left eye: Secondary | ICD-10-CM | POA: Diagnosis not present

## 2019-01-06 DIAGNOSIS — H25813 Combined forms of age-related cataract, bilateral: Secondary | ICD-10-CM | POA: Diagnosis not present

## 2019-01-06 MED FILL — XTANDI 40 MG CAPSULE: 40 | 30 days supply | Qty: 120 | Fill #0

## 2019-01-19 ENCOUNTER — Ambulatory Visit: Payer: Medicare Other | Admitting: Urology

## 2019-01-19 ENCOUNTER — Other Ambulatory Visit: Payer: Self-pay

## 2019-01-19 DIAGNOSIS — N3941 Urge incontinence: Secondary | ICD-10-CM

## 2019-01-19 DIAGNOSIS — R2242 Localized swelling, mass and lump, left lower limb: Secondary | ICD-10-CM | POA: Diagnosis not present

## 2019-01-19 DIAGNOSIS — R944 Abnormal results of kidney function studies: Secondary | ICD-10-CM | POA: Diagnosis not present

## 2019-01-19 DIAGNOSIS — R252 Cramp and spasm: Secondary | ICD-10-CM | POA: Diagnosis not present

## 2019-01-19 DIAGNOSIS — I10 Essential (primary) hypertension: Secondary | ICD-10-CM | POA: Diagnosis not present

## 2019-01-19 DIAGNOSIS — C61 Malignant neoplasm of prostate: Secondary | ICD-10-CM | POA: Diagnosis not present

## 2019-01-19 DIAGNOSIS — R600 Localized edema: Secondary | ICD-10-CM | POA: Diagnosis not present

## 2019-01-21 DIAGNOSIS — J309 Allergic rhinitis, unspecified: Secondary | ICD-10-CM | POA: Diagnosis not present

## 2019-01-21 DIAGNOSIS — D631 Anemia in chronic kidney disease: Secondary | ICD-10-CM | POA: Diagnosis not present

## 2019-01-21 DIAGNOSIS — N183 Chronic kidney disease, stage 3 (moderate): Secondary | ICD-10-CM | POA: Diagnosis not present

## 2019-01-21 DIAGNOSIS — E781 Pure hyperglyceridemia: Secondary | ICD-10-CM | POA: Diagnosis not present

## 2019-01-21 DIAGNOSIS — I10 Essential (primary) hypertension: Secondary | ICD-10-CM | POA: Diagnosis not present

## 2019-01-31 ENCOUNTER — Other Ambulatory Visit (HOSPITAL_COMMUNITY): Payer: Medicare Other

## 2019-01-31 ENCOUNTER — Ambulatory Visit (HOSPITAL_COMMUNITY): Payer: Medicare Other

## 2019-02-01 ENCOUNTER — Other Ambulatory Visit (HOSPITAL_COMMUNITY): Payer: Self-pay | Admitting: Internal Medicine

## 2019-02-01 DIAGNOSIS — C7951 Secondary malignant neoplasm of bone: Secondary | ICD-10-CM

## 2019-02-01 DIAGNOSIS — C61 Malignant neoplasm of prostate: Secondary | ICD-10-CM

## 2019-02-02 ENCOUNTER — Other Ambulatory Visit (HOSPITAL_COMMUNITY): Payer: Self-pay | Admitting: Hematology

## 2019-02-02 DIAGNOSIS — C7951 Secondary malignant neoplasm of bone: Secondary | ICD-10-CM

## 2019-02-02 DIAGNOSIS — C61 Malignant neoplasm of prostate: Secondary | ICD-10-CM

## 2019-02-02 MED FILL — XTANDI 40 MG CAPSULE: 40 | 30 days supply | Qty: 120 | Fill #0

## 2019-02-08 ENCOUNTER — Other Ambulatory Visit (HOSPITAL_COMMUNITY): Payer: Medicare Other

## 2019-02-11 ENCOUNTER — Encounter (HOSPITAL_COMMUNITY): Payer: Self-pay

## 2019-02-11 ENCOUNTER — Ambulatory Visit (HOSPITAL_COMMUNITY): Admit: 2019-02-11 | Payer: Medicare Other | Admitting: Ophthalmology

## 2019-02-11 SURGERY — PHACOEMULSIFICATION, CATARACT, WITH IOL INSERTION
Anesthesia: Monitor Anesthesia Care | Laterality: Right

## 2019-02-21 ENCOUNTER — Other Ambulatory Visit (HOSPITAL_COMMUNITY): Payer: Medicare Other

## 2019-02-25 ENCOUNTER — Encounter (HOSPITAL_COMMUNITY): Payer: Self-pay

## 2019-02-25 ENCOUNTER — Other Ambulatory Visit (HOSPITAL_COMMUNITY): Payer: Self-pay | Admitting: Hematology

## 2019-02-25 ENCOUNTER — Ambulatory Visit (HOSPITAL_COMMUNITY): Admit: 2019-02-25 | Payer: Medicare Other | Admitting: Ophthalmology

## 2019-02-25 DIAGNOSIS — C61 Malignant neoplasm of prostate: Secondary | ICD-10-CM

## 2019-02-25 DIAGNOSIS — C7951 Secondary malignant neoplasm of bone: Secondary | ICD-10-CM

## 2019-02-25 SURGERY — PHACOEMULSIFICATION, CATARACT, WITH IOL INSERTION
Anesthesia: Monitor Anesthesia Care | Laterality: Left

## 2019-03-01 ENCOUNTER — Other Ambulatory Visit (HOSPITAL_COMMUNITY): Payer: Medicare Other

## 2019-03-01 ENCOUNTER — Ambulatory Visit (HOSPITAL_COMMUNITY): Payer: Medicare Other

## 2019-03-01 MED FILL — XTANDI 40 MG CAPSULE: 40 | 30 days supply | Qty: 120 | Fill #0

## 2019-03-02 DIAGNOSIS — I739 Peripheral vascular disease, unspecified: Secondary | ICD-10-CM | POA: Diagnosis not present

## 2019-03-02 DIAGNOSIS — M2042 Other hammer toe(s) (acquired), left foot: Secondary | ICD-10-CM | POA: Diagnosis not present

## 2019-03-02 DIAGNOSIS — M79671 Pain in right foot: Secondary | ICD-10-CM | POA: Diagnosis not present

## 2019-03-02 DIAGNOSIS — L110 Acquired keratosis follicularis: Secondary | ICD-10-CM | POA: Diagnosis not present

## 2019-03-02 DIAGNOSIS — M2041 Other hammer toe(s) (acquired), right foot: Secondary | ICD-10-CM | POA: Diagnosis not present

## 2019-03-03 ENCOUNTER — Encounter: Payer: Self-pay | Admitting: General Practice

## 2019-03-03 ENCOUNTER — Other Ambulatory Visit (HOSPITAL_COMMUNITY): Payer: Self-pay | Admitting: *Deleted

## 2019-03-03 DIAGNOSIS — C7951 Secondary malignant neoplasm of bone: Secondary | ICD-10-CM

## 2019-03-03 DIAGNOSIS — C61 Malignant neoplasm of prostate: Secondary | ICD-10-CM

## 2019-03-03 DIAGNOSIS — R9721 Rising PSA following treatment for malignant neoplasm of prostate: Secondary | ICD-10-CM

## 2019-03-03 NOTE — Progress Notes (Signed)
[facility] CSW Progress Note  Patient reports food insecurity, would like to enroll in [employer] food box program, agreeable to program contacting him to set up delivery.  Anne Cunningham, LCSW Clinical Social Worker Phone:  [phone]   

## 2019-03-04 ENCOUNTER — Ambulatory Visit (HOSPITAL_COMMUNITY): Payer: Medicare Other | Admitting: Hematology

## 2019-03-04 ENCOUNTER — Inpatient Hospital Stay (HOSPITAL_COMMUNITY): Payer: Medicare Other

## 2019-03-04 ENCOUNTER — Encounter (HOSPITAL_COMMUNITY): Payer: Self-pay | Admitting: Hematology

## 2019-03-04 ENCOUNTER — Other Ambulatory Visit: Payer: Self-pay

## 2019-03-04 ENCOUNTER — Inpatient Hospital Stay (HOSPITAL_COMMUNITY): Payer: Medicare Other | Attending: Hematology | Admitting: Hematology

## 2019-03-04 DIAGNOSIS — Z79899 Other long term (current) drug therapy: Secondary | ICD-10-CM | POA: Diagnosis not present

## 2019-03-04 DIAGNOSIS — Z87891 Personal history of nicotine dependence: Secondary | ICD-10-CM | POA: Diagnosis not present

## 2019-03-04 DIAGNOSIS — N189 Chronic kidney disease, unspecified: Secondary | ICD-10-CM | POA: Diagnosis not present

## 2019-03-04 DIAGNOSIS — C7951 Secondary malignant neoplasm of bone: Secondary | ICD-10-CM

## 2019-03-04 DIAGNOSIS — I10 Essential (primary) hypertension: Secondary | ICD-10-CM

## 2019-03-04 DIAGNOSIS — R9721 Rising PSA following treatment for malignant neoplasm of prostate: Secondary | ICD-10-CM

## 2019-03-04 DIAGNOSIS — C61 Malignant neoplasm of prostate: Secondary | ICD-10-CM | POA: Insufficient documentation

## 2019-03-04 LAB — CBC WITH DIFFERENTIAL/PLATELET
Abs Immature Granulocytes: 0.02 10*3/uL (ref 0.00–0.07)
Basophils Absolute: 0 10*3/uL (ref 0.0–0.1)
Basophils Relative: 0 %
Eosinophils Absolute: 0.2 10*3/uL (ref 0.0–0.5)
Eosinophils Relative: 3 %
HCT: 37.4 % — ABNORMAL LOW (ref 39.0–52.0)
Hemoglobin: 12.2 g/dL — ABNORMAL LOW (ref 13.0–17.0)
Immature Granulocytes: 0 %
Lymphocytes Relative: 30 %
Lymphs Abs: 1.8 10*3/uL (ref 0.7–4.0)
MCH: 28.4 pg (ref 26.0–34.0)
MCHC: 32.6 g/dL (ref 30.0–36.0)
MCV: 87.2 fL (ref 80.0–100.0)
Monocytes Absolute: 0.3 10*3/uL (ref 0.1–1.0)
Monocytes Relative: 5 %
Neutro Abs: 3.6 10*3/uL (ref 1.7–7.7)
Neutrophils Relative %: 62 %
Platelets: 194 10*3/uL (ref 150–400)
RBC: 4.29 MIL/uL (ref 4.22–5.81)
RDW: 15.7 % — ABNORMAL HIGH (ref 11.5–15.5)
WBC: 6 10*3/uL (ref 4.0–10.5)
nRBC: 0 % (ref 0.0–0.2)

## 2019-03-04 LAB — PSA: Prostatic Specific Antigen: 4.09 ng/mL — ABNORMAL HIGH (ref 0.00–4.00)

## 2019-03-04 LAB — COMPREHENSIVE METABOLIC PANEL
ALT: 15 U/L (ref 0–44)
AST: 25 U/L (ref 15–41)
Albumin: 4 g/dL (ref 3.5–5.0)
Alkaline Phosphatase: 68 U/L (ref 38–126)
Anion gap: 11 (ref 5–15)
BUN: 20 mg/dL (ref 8–23)
CO2: 24 mmol/L (ref 22–32)
Calcium: 9 mg/dL (ref 8.9–10.3)
Chloride: 103 mmol/L (ref 98–111)
Creatinine, Ser: 1.76 mg/dL — ABNORMAL HIGH (ref 0.61–1.24)
GFR calc Af Amer: 42 mL/min — ABNORMAL LOW (ref >60)
GFR calc non Af Amer: 36 mL/min — ABNORMAL LOW (ref >60)
Glucose, Bld: 118 mg/dL — ABNORMAL HIGH (ref 70–99)
Potassium: 3.9 mmol/L (ref 3.5–5.1)
Sodium: 138 mmol/L (ref 135–145)
Total Bilirubin: 0.8 mg/dL (ref 0.3–1.2)
Total Protein: 7.6 g/dL (ref 6.5–8.1)

## 2019-03-04 LAB — LACTATE DEHYDROGENASE: LDH: 170 U/L (ref 98–192)

## 2019-03-04 MED ORDER — DENOSUMAB 120 MG/1.7ML ~~LOC~~ SOLN
120.0000 mg | Freq: Once | SUBCUTANEOUS | Status: AC
  Administered 2019-03-04: 120 mg via SUBCUTANEOUS

## 2019-03-04 MED ORDER — DENOSUMAB 120 MG/1.7ML ~~LOC~~ SOLN
SUBCUTANEOUS | Status: AC
  Filled 2019-03-04: qty 1.7

## 2019-03-04 NOTE — Assessment & Plan Note (Addendum)
1.  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 Noland Fordyce office every 6 months. -Last bone scan on 02/07/2018 shows scattered degenerative type uptake at T8 vertebral body corresponding to chronic T8 compression fracture.  Questionable abnormal uptake versus artifact at the right humeral diaphysis.  Subsequent plain x-ray of the humerus was negative. -CT abdomen pelvis on 03/24/2018 shows mild left hydronephrosis secondary to enlarging left internal iliac adenopathy.  Additional retroperitoneal and perirectal lymph nodes grossly stable in size. - Enzalutamide 160 mg was started. - PSA on 11/02/2018 was 4.75.  It was 3.47 on 12/01/2018. -We will see him back in 4 weeks for follow-up.  We will repeat a PSA at that time.  2.  Bone metastasis: - He was started on denosumab on 03/05/2017.  He is receiving monthly.  He is tolerating it very well.  3.  CKD: -This has been stable with creatinine around 1.7.  Calcium is normal.

## 2019-03-04 NOTE — Progress Notes (Signed)
[place], [place] [address]   CLINIC:  Medical Oncology/Hematology  PCP:  Celene Squibb, MD [place] [address] [phone]   REASON FOR VISIT:  Follow-up for prostate cancer.    INTERVAL HISTORY:  [name] 78 y.o. male returns for routine follow-up. He is here today alone. He states that he that he is taking his "cancer pill" as directed without any missed doses or side effects. He states that he has been doing well since his last visit. He states that he is able to do normal house work and lawn care at home. Denies any nausea, vomiting, or diarrhea. Denies any new pains. Had not noticed any recent bleeding such as epistaxis, hematuria or hematochezia. Denies recent chest pain on exertion, shortness of breath on minimal exertion, pre-syncopal episodes, or palpitations. Denies any numbness or tingling in hands or feet. Denies any recent fevers, infections, or recent hospitalizations. Patient reports appetite at 100% and energy level at 75%.     REVIEW OF SYSTEMS:  Review of Systems  All other systems reviewed and are negative.    PAST MEDICAL/SURGICAL HISTORY:  Past Medical History:  Diagnosis Date  . Cancer [facility])    prostate  . GERD (gastroesophageal reflux disease)   . Hyperlipidemia   . Hypertension   . 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 Sleep apnea 10/24/2016   Past Surgical History:  Procedure Laterality Date  . CHOLECYSTECTOMY    . HAND SURGERY    . INGUINAL HERNIA REPAIR Left   . PROSTATECTOMY    . PROSTATECTOMY    . radical prostatectomy    . TONSILLECTOMY       SOCIAL HISTORY:  Social History   Socioeconomic History  . Marital status: Widowed    Spouse name: Not on file  . Number of children: 3  . Years of education: 9  . Highest education level: Not on file  Occupational History  . Occupation: Animator- retired    Comment: [employer]  Social Needs  .  Financial resource strain: Not on file  . Food insecurity:    Worry: Not on file    Inability: Not on file  . Transportation needs:    Medical: Not on file    Non-medical: Not on file  Tobacco Use  . Smoking status: Former Smoker    Packs/day: 1.00    Years: 32.00    Pack years: 32.00    Types: Cigarettes    Last attempt to quit: 02/04/1989    Years since quitting: 30.0  . Smokeless tobacco: Never Used  Substance and Sexual Activity  . Alcohol use: No  . Drug use: No  . Sexual activity: Not Currently  Lifestyle  . Physical activity:    Days per week: Not on file    Minutes per session: Not on file  . Stress: Not on file  Relationships  . Social connections:    Talks on phone: Not on file    Gets together: Not on file    Attends religious service: Not on file    Active member of club or organization: Not on file    Attends meetings of clubs or organizations: Not on file    Relationship status: Not on file  . Intimate partner violence:    Fear of current or ex partner: Not on file    Emotionally abused: Not on file    Physically abused: Not on  file    Forced sexual activity: Not on file  Other Topics Concern  . Not on file  Social History Narrative   Widowed   [place] with son   Chronic low back pain    FAMILY HISTORY:  Family History  Problem Relation Age of Onset  . Heart failure Mother        CHF  . Prostate cancer Father   . Narcolepsy Sister   . Cancer Daughter     CURRENT MEDICATIONS:  Outpatient Encounter Medications as of 03/04/2019  Medication Sig Note  . benazepril (LOTENSIN) 20 MG tablet TAKE ONE TABLET BY MOUTH DAILY.   . calcium-vitamin D (OSCAL WITH D) 500-200 MG-UNIT TABS tablet Take 1 tablet by mouth daily.   . cetirizine (ZYRTEC) 10 MG tablet TAKE ONE TABLET BY MOUTH DAILY.   . clotrimazole-betamethasone (LOTRISONE) cream Apply 1 application topically 2 (two) times daily.   . Ferrous Sulfate (IRON) 325 (65 Fe) MG TABS Take 1 tablet by mouth  daily.   . fexofenadine (ALLEGRA) 180 MG tablet Take 180 mg by mouth daily. 11/02/2018: Not sure which allergy medication he is currently taking   . fluticasone (FLONASE) 50 MCG/ACT nasal spray USE ONE SPRAY IN EACH NOSTRIL ONCE DAILY. SHAKE GENTLY BEFORE USING.   . furosemide (LASIX) 40 MG tablet TAKE ONE TABLET BY MOUTH DAILY. TAKE WITH POTASSIUM.   Marland Kitchen hydrochlorothiazide (HYDRODIURIL) 25 MG tablet TAKE ONE TABLET BY MOUTH DAILY.   Marland Kitchen ketoconazole (NIZORAL) 2 % cream APPLY A SMALL AMOUNT TO FACE RASH TWICE DAILY FOR UP TO FOUR WEEKS IF NEEDED.   Marland Kitchen losartan (COZAAR) 100 MG tablet Take 100 mg by mouth daily.   . meloxicam (MOBIC) 7.5 MG tablet Take 1 tablet (7.5 mg total) daily by mouth.   . methylphenidate (RITALIN) 20 MG tablet Take 20 mg by mouth daily.   . mirabegron ER (MYRBETRIQ) 25 MG TB24 tablet Take by mouth.   . potassium chloride SA (K-DUR,KLOR-CON) 20 MEQ tablet Take 20 mEq by mouth daily.   Marland Kitchen selenium sulfide (SELSUN) 2.5 % shampoo Apply 1 application topically daily as needed for irritation.   . traMADol (ULTRAM) 50 MG tablet TAKE ONE TABLET BY MOUTH EVERY 6 HOURS AS NEEDED FOR PAIN   . XTANDI 40 MG capsule TAKE 4 CAPSULES (160 MG TOTAL) BY MOUTH DAILY.   Marland Kitchen ILEVRO 0.3 % ophthalmic suspension INSTILL ONE DROP IN THE OPERATIVE EYE DAILY. START TWO DAYS BEFORE SURGERY AND CONTINUE AS DIRECTED.   Marland Kitchen moxifloxacin (VIGAMOX) 0.5 % ophthalmic solution INSTILL ONE DROP THREE TIMES DAILY IN THE OPERATIVE EYE. START TWO DAYS BEFORE SURGERY AND CONTINUE AS DIRECTED.   Marland Kitchen prednisoLONE acetate (PRED FORTE) 1 % ophthalmic suspension INSTILL ONE DROP IN THE OPERATIVE EYE THREE TIMES DAILY. START ONE DAY AFTER SURGERY AND CONTINUE AS DIRECTED.   . [DISCONTINUED] calcium-vitamin D (OSCAL-500) 500-400 MG-UNIT tablet Take by mouth.   . [DISCONTINUED] losartan (COZAAR) 100 MG tablet Take by mouth.   . [DISCONTINUED] potassium chloride (KLOR-CON) 20 MEQ packet Take by mouth.    No facility-administered  encounter medications on file as of 03/04/2019.     ALLERGIES:  No Known Allergies   PHYSICAL EXAM:  ECOG Performance status: 1  Vitals:   03/04/19 1019  BP: (!) 136/57  Pulse: 81  Resp: 18  Temp: 98 F (36.7 C)  SpO2: 100%   Filed Weights   03/04/19 1019  Weight: 272 lb (123.4 kg)    Physical Exam Vitals signs reviewed.  Constitutional:      Appearance: Normal appearance.  Cardiovascular:     Rate and Rhythm: Normal rate and regular rhythm.     Heart sounds: Normal heart sounds.  Pulmonary:     Effort: Pulmonary effort is normal.     Breath sounds: Normal breath sounds.  Abdominal:     General: There is no distension.     Palpations: Abdomen is soft. There is no mass.  Musculoskeletal:        General: No swelling.  Skin:    General: Skin is warm.  Neurological:     General: No focal deficit present.     Mental Status: He is alert and oriented to person, place, and time.  Psychiatric:        Mood and Affect: Mood normal.        Behavior: Behavior normal.      LABORATORY DATA:  I have reviewed the labs as listed.  CBC    Component Value Date/Time   WBC 6.0 03/04/2019 0950   RBC 4.29 03/04/2019 0950   HGB 12.2 (L) 03/04/2019 0950   HCT 37.4 (L) 03/04/2019 0950   PLT 194 03/04/2019 0950   MCV 87.2 03/04/2019 0950   MCH 28.4 03/04/2019 0950   MCHC 32.6 03/04/2019 0950   RDW 15.7 (H) 03/04/2019 0950   LYMPHSABS 1.8 03/04/2019 0950   MONOABS 0.3 03/04/2019 0950   EOSABS 0.2 03/04/2019 0950   BASOSABS 0.0 03/04/2019 0950   CMP Latest Ref Rng & Units 03/04/2019 12/30/2018 12/01/2018  Glucose 70 - 99 mg/dL 118(H) 100(H) 98  BUN 8 - 23 mg/dL 20 19 20   Creatinine 0.61 - 1.24 mg/dL 1.76(H) 1.75(H) 1.89(H)  Sodium 135 - 145 mmol/L 138 136 137  Potassium 3.5 - 5.1 mmol/L 3.9 4.3 4.3  Chloride 98 - 111 mmol/L 103 103 103  CO2 22 - 32 mmol/L 24 26 26   Calcium 8.9 - 10.3 mg/dL 9.0 9.4 9.4  Total Protein 6.5 - 8.1 g/dL 7.6 7.8 7.7  Total Bilirubin 0.3 - 1.2 mg/dL  0.8 0.5 0.4  Alkaline Phos 38 - 126 U/L 68 70 54  AST 15 - 41 U/L 25 23 28   ALT 0 - 44 U/L 15 14 18        DIAGNOSTIC IMAGING:  I have independently reviewed the scans and discussed with the patient.   I have reviewed Venita Lick LPN's note and agree with the documentation.  I personally performed a face-to-face visit, made revisions and my assessment and plan is as follows.    ASSESSMENT & PLAN:   Prostate cancer metastatic to bone ([place]) 1.  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 Noland Fordyce office every 6 months. -Last bone scan on 02/07/2018 shows scattered degenerative type uptake at T8 vertebral body corresponding to chronic T8 compression fracture.  Questionable abnormal uptake versus artifact at the right humeral diaphysis.  Subsequent plain x-ray of the humerus was negative. -CT abdomen pelvis on 03/24/2018 shows mild left hydronephrosis secondary to enlarging left internal iliac adenopathy.  Additional retroperitoneal and perirectal lymph nodes grossly stable in size. - Enzalutamide 160 mg was started. - PSA on 11/02/2018 was 4.75.  It was 3.47 on 12/01/2018. -We will see him back in 4 weeks for follow-up.  We will repeat a PSA at that time.  2.  Bone metastasis: - He was started on denosumab on 03/05/2017.  He is receiving monthly.  He is tolerating it very well.  3.  CKD: -This has been stable with creatinine around 1.7.  Calcium is normal.   Total time spent is 25 minutes with more than 50% of the time spent face-to-face discussing treatment plan and coordination of care.  Orders placed this encounter:  Orders Placed This Encounter  Procedures  . CBC with Differential/Platelet  . Comprehensive metabolic panel  . PSA      Derek Jack, MD [place] [phone]

## 2019-03-04 NOTE — Patient Instructions (Addendum)
[facility] at [facility] Discharge Instructions  You were seen today by Dr. Katragadda. He went over your recent lab results. He will see you back in 4 weeks for labs and follow up.   Thank you for choosing [facility] at [facility] to provide your oncology and hematology care.  To afford each patient quality time with our provider, please arrive at least 15 minutes before your scheduled appointment time.   If you have a lab appointment with the Cancer Center please come in thru the  Main Entrance and check in at the main information desk  You need to re-schedule your appointment should you arrive 10 or more minutes late.  We strive to give you quality time with our providers, and arriving late affects you and other patients whose appointments are after yours.  Also, if you no show three or more times for appointments you may be dismissed from the clinic at the providers discretion.     Again, thank you for choosing [facility].  Our hope is that these requests will decrease the amount of time that you wait before being seen by our physicians.       _____________________________________________________________  Should you have questions after your visit to [facility], please contact our office at [phone] between the hours of 8:00 a.m. and 4:30 p.m.  Voicemails left after 4:00 p.m. will not be returned until the following business day.  For prescription refill requests, have your pharmacy contact our office and allow 72 hours.    Cancer Center Support Programs:   > Cancer Support Group  2nd Tuesday of the month 1pm-2pm, Journey Room    

## 2019-03-07 LAB — PROTEIN ELECTROPHORESIS, SERUM
A/G Ratio: 1 (ref 0.7–1.7)
Albumin ELP: 3.5 g/dL (ref 2.9–4.4)
Alpha-1-Globulin: 0.2 g/dL (ref 0.0–0.4)
Alpha-2-Globulin: 0.7 g/dL (ref 0.4–1.0)
Beta Globulin: 1.2 g/dL (ref 0.7–1.3)
Gamma Globulin: 1.3 g/dL (ref 0.4–1.8)
Globulin, Total: 3.4 g/dL (ref 2.2–3.9)
Total Protein ELP: 6.9 g/dL (ref 6.0–8.5)

## 2019-03-16 DIAGNOSIS — M545 Low back pain: Secondary | ICD-10-CM | POA: Diagnosis not present

## 2019-03-28 ENCOUNTER — Other Ambulatory Visit (HOSPITAL_COMMUNITY): Payer: Self-pay | Admitting: Hematology

## 2019-03-28 DIAGNOSIS — C7951 Secondary malignant neoplasm of bone: Secondary | ICD-10-CM

## 2019-03-28 DIAGNOSIS — C61 Malignant neoplasm of prostate: Secondary | ICD-10-CM

## 2019-03-29 MED FILL — XTANDI 40 MG CAPSULE: 40 | 30 days supply | Qty: 120 | Fill #0

## 2019-04-01 ENCOUNTER — Ambulatory Visit (HOSPITAL_COMMUNITY): Payer: Medicare Other

## 2019-04-01 ENCOUNTER — Other Ambulatory Visit (HOSPITAL_COMMUNITY): Payer: Medicare Other

## 2019-04-01 ENCOUNTER — Ambulatory Visit (HOSPITAL_COMMUNITY): Payer: Medicare Other | Admitting: Hematology

## 2019-04-01 ENCOUNTER — Other Ambulatory Visit: Payer: Self-pay

## 2019-04-04 ENCOUNTER — Inpatient Hospital Stay (HOSPITAL_COMMUNITY): Payer: Medicare Other | Attending: Hematology

## 2019-04-04 ENCOUNTER — Inpatient Hospital Stay (HOSPITAL_BASED_OUTPATIENT_CLINIC_OR_DEPARTMENT_OTHER): Payer: Medicare Other | Admitting: Hematology

## 2019-04-04 ENCOUNTER — Encounter (HOSPITAL_COMMUNITY): Payer: Self-pay | Admitting: Hematology

## 2019-04-04 ENCOUNTER — Telehealth (HOSPITAL_COMMUNITY): Payer: Self-pay | Admitting: *Deleted

## 2019-04-04 ENCOUNTER — Other Ambulatory Visit: Payer: Self-pay

## 2019-04-04 ENCOUNTER — Inpatient Hospital Stay (HOSPITAL_COMMUNITY): Payer: Medicare Other

## 2019-04-04 VITALS — BP 140/95 | HR 58 | Temp 97.6°F | Resp 16 | Wt 271.2 lb

## 2019-04-04 DIAGNOSIS — C61 Malignant neoplasm of prostate: Secondary | ICD-10-CM

## 2019-04-04 DIAGNOSIS — C7951 Secondary malignant neoplasm of bone: Secondary | ICD-10-CM

## 2019-04-04 DIAGNOSIS — Z87891 Personal history of nicotine dependence: Secondary | ICD-10-CM

## 2019-04-04 DIAGNOSIS — I129 Hypertensive chronic kidney disease with stage 1 through stage 4 chronic kidney disease, or unspecified chronic kidney disease: Secondary | ICD-10-CM | POA: Diagnosis not present

## 2019-04-04 DIAGNOSIS — N189 Chronic kidney disease, unspecified: Secondary | ICD-10-CM | POA: Diagnosis not present

## 2019-04-04 LAB — CBC WITH DIFFERENTIAL/PLATELET
Abs Immature Granulocytes: 0.01 10*3/uL (ref 0.00–0.07)
Basophils Absolute: 0 10*3/uL (ref 0.0–0.1)
Basophils Relative: 1 %
Eosinophils Absolute: 0.2 10*3/uL (ref 0.0–0.5)
Eosinophils Relative: 3 %
HCT: 37.4 % — ABNORMAL LOW (ref 39.0–52.0)
Hemoglobin: 11.9 g/dL — ABNORMAL LOW (ref 13.0–17.0)
Immature Granulocytes: 0 %
Lymphocytes Relative: 35 %
Lymphs Abs: 1.8 10*3/uL (ref 0.7–4.0)
MCH: 28.1 pg (ref 26.0–34.0)
MCHC: 31.8 g/dL (ref 30.0–36.0)
MCV: 88.2 fL (ref 80.0–100.0)
Monocytes Absolute: 0.3 10*3/uL (ref 0.1–1.0)
Monocytes Relative: 5 %
Neutro Abs: 2.9 10*3/uL (ref 1.7–7.7)
Neutrophils Relative %: 56 %
Platelets: 176 10*3/uL (ref 150–400)
RBC: 4.24 MIL/uL (ref 4.22–5.81)
RDW: 15.6 % — ABNORMAL HIGH (ref 11.5–15.5)
WBC: 5.1 10*3/uL (ref 4.0–10.5)
nRBC: 0 % (ref 0.0–0.2)

## 2019-04-04 LAB — COMPREHENSIVE METABOLIC PANEL WITH GFR
ALT: 17 U/L (ref 0–44)
AST: 25 U/L (ref 15–41)
Albumin: 4 g/dL (ref 3.5–5.0)
Alkaline Phosphatase: 60 U/L (ref 38–126)
Anion gap: 12 (ref 5–15)
BUN: 19 mg/dL (ref 8–23)
CO2: 25 mmol/L (ref 22–32)
Calcium: 9.6 mg/dL (ref 8.9–10.3)
Chloride: 102 mmol/L (ref 98–111)
Creatinine, Ser: 1.79 mg/dL — ABNORMAL HIGH (ref 0.61–1.24)
GFR calc Af Amer: 41 mL/min — ABNORMAL LOW (ref >60)
GFR calc non Af Amer: 35 mL/min — ABNORMAL LOW (ref >60)
Glucose, Bld: 94 mg/dL (ref 70–99)
Potassium: 3.8 mmol/L (ref 3.5–5.1)
Sodium: 139 mmol/L (ref 135–145)
Total Bilirubin: 0.7 mg/dL (ref 0.3–1.2)
Total Protein: 7.3 g/dL (ref 6.5–8.1)

## 2019-04-04 LAB — PSA: Prostatic Specific Antigen: 4.71 ng/mL — ABNORMAL HIGH (ref 0.00–4.00)

## 2019-04-04 MED ORDER — DENOSUMAB 120 MG/1.7ML ~~LOC~~ SOLN
120.0000 mg | Freq: Once | SUBCUTANEOUS | Status: AC
  Administered 2019-04-04: 120 mg via SUBCUTANEOUS

## 2019-04-04 MED ORDER — DENOSUMAB 120 MG/1.7ML ~~LOC~~ SOLN
SUBCUTANEOUS | Status: AC
  Filled 2019-04-04: qty 1.7

## 2019-04-04 NOTE — Patient Instructions (Signed)
[facility] at [facility] Discharge Instructions  Received Xgeva injection today. Follow-up as scheduled. Call clinic for any questions or concerns   Thank you for choosing [facility] at [facility] to provide your oncology and hematology care.  To afford each patient quality time with our provider, please arrive at least 15 minutes before your scheduled appointment time.   If you have a lab appointment with the Cancer Center please come in thru the  Main Entrance and check in at the main information desk  You need to re-schedule your appointment should you arrive 10 or more minutes late.  We strive to give you quality time with our providers, and arriving late affects you and other patients whose appointments are after yours.  Also, if you no show three or more times for appointments you may be dismissed from the clinic at the providers discretion.     Again, thank you for choosing [facility].  Our hope is that these requests will decrease the amount of time that you wait before being seen by our physicians.       _____________________________________________________________  Should you have questions after your visit to [facility], please contact our office at [phone] between the hours of 8:00 a.m. and 4:30 p.m.  Voicemails left after 4:00 p.m. will not be returned until the following business day.  For prescription refill requests, have your pharmacy contact our office and allow 72 hours.    Cancer Center Support Programs:   > Cancer Support Group  2nd Tuesday of the month 1pm-2pm, Journey Room   

## 2019-04-04 NOTE — Telephone Encounter (Signed)
[facility] about this patient to clarify his last injection of Lupron and the dosage. I spoke with the nurse Jenny Reichmann and she stated he last had it January 19, 2019 with a dosage of 22.5mg /3 months. Dr. Larwance Sachs notified.

## 2019-04-04 NOTE — Progress Notes (Signed)
[name] tolerated Xgeva injection well without complaints or incident. Calcium 9.6 today and pt denied any tooth or jaw pain and no recent or future dental visits prior to administering this medication.Labs reviewed with and pt seen by Dr. Delton Coombes as well. Pt continues to take his Calcium PO as prescribed. Pt discharged self ambulatory in satisfactory condition

## 2019-04-04 NOTE — Assessment & Plan Note (Signed)
1.  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 Noland Fordyce office every 6 months.  He is not able to recall when his last shot was.  -Last bone scan on 02/07/2018 shows scattered degenerative type uptake at T8 vertebral body corresponding to chronic T8 compression fracture.  Questionable abnormal uptake versus artifact at the right humeral diaphysis.  Subsequent plain x-ray of the humerus was negative. -CT abdomen pelvis on 03/24/2018 shows mild left hydronephrosis secondary to enlarging left internal iliac adenopathy.  Additional retroperitoneal and perirectal lymph nodes grossly stable in size. - Enzalutamide 160 mg daily started approximately towards the end of November 2019. -PSA on 11/02/2018 was 4.75, down to 3.47 on 12/01/2018.  We reviewed his labs.  His PSA went up to 4.09. - We will call Dr. Noland Fordyce office to find out when he received his last Lupron injection.  He reports that he has been taking enzalutamide daily. -I will see him back in 4 weeks for follow-up.  2.  Bone metastasis: -Denosumab was started on 03/05/2017.  He is tolerating it very well. -I have suggested him to take calcium twice daily.  Today he may proceed with denosumab.  3.  CKD: -Creatinine has been stable between 1.7-1.8.

## 2019-04-04 NOTE — Progress Notes (Signed)
[facility] [address], [place] [address]   CLINIC:  Medical Oncology/Hematology  PCP:  Celene Squibb, MD [place] [address] [phone]   REASON FOR VISIT:  Follow-up for metastatic prostate cancer    INTERVAL HISTORY:  [name] 78 y.o. male returns for routine follow-up.  He reports that he has been taking enzalutamide daily.  He thinks he might be taking it twice a day.  He does not recollect when his last Lupron injection was.  Denies any nausea, vomiting, or diarrhea. Denies any new pains. Had not noticed any recent bleeding such as epistaxis, hematuria or hematochezia. Denies recent chest pain on exertion, shortness of breath on minimal exertion, pre-syncopal episodes, or palpitations. Denies any numbness or tingling in hands or feet. Denies any recent fevers, infections, or recent hospitalizations. Patient reports appetite at 100 % and energy level at 75 %.  REVIEW OF SYSTEMS:  Review of Systems  Cardiovascular: Positive for leg swelling.  All other systems reviewed and are negative.    PAST MEDICAL/SURGICAL HISTORY:  Past Medical History:  Diagnosis Date  . Cancer [facility])    prostate  . GERD (gastroesophageal reflux disease)   . Hyperlipidemia   . Hypertension   . 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 Sleep apnea 10/24/2016   Past Surgical History:  Procedure Laterality Date  . CHOLECYSTECTOMY    . HAND SURGERY    . INGUINAL HERNIA REPAIR Left   . PROSTATECTOMY    . PROSTATECTOMY    . radical prostatectomy    . TONSILLECTOMY       SOCIAL HISTORY:  Social History   Socioeconomic History  . Marital status: Widowed    Spouse name: Not on file  . Number of children: 3  . Years of education: 9  . Highest education level: Not on file  Occupational History  . Occupation: Animator- retired    Comment: [employer]  Social Needs  . Financial resource strain: Not on file  . Food  insecurity:    Worry: Not on file    Inability: Not on file  . Transportation needs:    Medical: Not on file    Non-medical: Not on file  Tobacco Use  . Smoking status: Former Smoker    Packs/day: 1.00    Years: 32.00    Pack years: 32.00    Types: Cigarettes    Last attempt to quit: 02/04/1989    Years since quitting: 30.1  . Smokeless tobacco: Never Used  Substance and Sexual Activity  . Alcohol use: No  . Drug use: No  . Sexual activity: Not Currently  Lifestyle  . Physical activity:    Days per week: Not on file    Minutes per session: Not on file  . Stress: Not on file  Relationships  . Social connections:    Talks on phone: Not on file    Gets together: Not on file    Attends religious service: Not on file    Active member of club or organization: Not on file    Attends meetings of clubs or organizations: Not on file    Relationship status: Not on file  . Intimate partner violence:    Fear of current or ex partner: Not on file    Emotionally abused: Not on file    Physically abused: Not on file    Forced sexual activity: Not on file  Other Topics Concern  .  Not on file  Social History Narrative   Widowed   [place] with son   Chronic low back pain    FAMILY HISTORY:  Family History  Problem Relation Age of Onset  . Heart failure Mother        CHF  . Prostate cancer Father   . Narcolepsy Sister   . Cancer Daughter     CURRENT MEDICATIONS:  Outpatient Encounter Medications as of 04/04/2019  Medication Sig Note  . benazepril (LOTENSIN) 20 MG tablet TAKE ONE TABLET BY MOUTH DAILY.   . calcium-vitamin D (OSCAL WITH D) 500-200 MG-UNIT TABS tablet Take 1 tablet by mouth daily.   . cetirizine (ZYRTEC) 10 MG tablet TAKE ONE TABLET BY MOUTH DAILY.   . clotrimazole-betamethasone (LOTRISONE) cream Apply 1 application topically 2 (two) times daily.   . Ferrous Sulfate (IRON) 325 (65 Fe) MG TABS Take 1 tablet by mouth daily.   . fexofenadine (ALLEGRA) 180 MG tablet  Take 180 mg by mouth daily. 11/02/2018: Not sure which allergy medication he is currently taking   . fluticasone (FLONASE) 50 MCG/ACT nasal spray USE ONE SPRAY IN EACH NOSTRIL ONCE DAILY. SHAKE GENTLY BEFORE USING.   . furosemide (LASIX) 40 MG tablet TAKE ONE TABLET BY MOUTH DAILY. TAKE WITH POTASSIUM.   Marland Kitchen hydrochlorothiazide (HYDRODIURIL) 25 MG tablet TAKE ONE TABLET BY MOUTH DAILY.   Marland Kitchen ILEVRO 0.3 % ophthalmic suspension INSTILL ONE DROP IN THE OPERATIVE EYE DAILY. START TWO DAYS BEFORE SURGERY AND CONTINUE AS DIRECTED.   Marland Kitchen ketoconazole (NIZORAL) 2 % cream APPLY A SMALL AMOUNT TO FACE RASH TWICE DAILY FOR UP TO FOUR WEEKS IF NEEDED.   Marland Kitchen losartan (COZAAR) 100 MG tablet Take 100 mg by mouth daily.   . meloxicam (MOBIC) 7.5 MG tablet Take 1 tablet (7.5 mg total) daily by mouth.   . methylphenidate (RITALIN) 20 MG tablet Take 20 mg by mouth daily.   . mirabegron ER (MYRBETRIQ) 25 MG TB24 tablet Take by mouth.   . moxifloxacin (VIGAMOX) 0.5 % ophthalmic solution INSTILL ONE DROP THREE TIMES DAILY IN THE OPERATIVE EYE. START TWO DAYS BEFORE SURGERY AND CONTINUE AS DIRECTED.   Marland Kitchen potassium chloride SA (K-DUR,KLOR-CON) 20 MEQ tablet Take 20 mEq by mouth daily.   . prednisoLONE acetate (PRED FORTE) 1 % ophthalmic suspension INSTILL ONE DROP IN THE OPERATIVE EYE THREE TIMES DAILY. START ONE DAY AFTER SURGERY AND CONTINUE AS DIRECTED.   Marland Kitchen selenium sulfide (SELSUN) 2.5 % shampoo Apply 1 application topically daily as needed for irritation.   . traMADol (ULTRAM) 50 MG tablet TAKE ONE TABLET BY MOUTH EVERY 6 HOURS AS NEEDED FOR PAIN   . XTANDI 40 MG capsule TAKE 4 CAPSULES (160 MG TOTAL) BY MOUTH DAILY.    No facility-administered encounter medications on file as of 04/04/2019.     ALLERGIES:  No Known Allergies   PHYSICAL EXAM:  ECOG Performance status: 1  Vitals:   04/04/19 1000  BP: (!) 140/95  Pulse: (!) 58  Resp: 16  Temp: 97.6 F (36.4 C)  SpO2: 99%   Filed Weights   04/04/19 1000  Weight:  271 lb 3 oz (123 kg)    Physical Exam Vitals signs reviewed.  Constitutional:      Appearance: Normal appearance.  Cardiovascular:     Rate and Rhythm: Normal rate and regular rhythm.     Heart sounds: Normal heart sounds.  Pulmonary:     Effort: Pulmonary effort is normal.     Breath sounds: Normal  breath sounds.  Abdominal:     General: There is no distension.     Palpations: Abdomen is soft. There is no mass.  Musculoskeletal:        General: Swelling present.  Skin:    General: Skin is warm.  Neurological:     General: No focal deficit present.     Mental Status: He is alert and oriented to person, place, and time.  Psychiatric:        Mood and Affect: Mood normal.        Behavior: Behavior normal.      LABORATORY DATA:  I have reviewed the labs as listed.  CBC    Component Value Date/Time   WBC 5.1 04/04/2019 0946   RBC 4.24 04/04/2019 0946   HGB 11.9 (L) 04/04/2019 0946   HCT 37.4 (L) 04/04/2019 0946   PLT 176 04/04/2019 0946   MCV 88.2 04/04/2019 0946   MCH 28.1 04/04/2019 0946   MCHC 31.8 04/04/2019 0946   RDW 15.6 (H) 04/04/2019 0946   LYMPHSABS 1.8 04/04/2019 0946   MONOABS 0.3 04/04/2019 0946   EOSABS 0.2 04/04/2019 0946   BASOSABS 0.0 04/04/2019 0946   CMP Latest Ref Rng & Units 04/04/2019 03/04/2019 12/30/2018  Glucose 70 - 99 mg/dL 94 118(H) 100(H)  BUN 8 - 23 mg/dL 19 20 19   Creatinine 0.61 - 1.24 mg/dL 1.79(H) 1.76(H) 1.75(H)  Sodium 135 - 145 mmol/L 139 138 136  Potassium 3.5 - 5.1 mmol/L 3.8 3.9 4.3  Chloride 98 - 111 mmol/L 102 103 103  CO2 22 - 32 mmol/L 25 24 26   Calcium 8.9 - 10.3 mg/dL 9.6 9.0 9.4  Total Protein 6.5 - 8.1 g/dL 7.3 7.6 7.8  Total Bilirubin 0.3 - 1.2 mg/dL 0.7 0.8 0.5  Alkaline Phos 38 - 126 U/L 60 68 70  AST 15 - 41 U/L 25 25 23   ALT 0 - 44 U/L 17 15 14        DIAGNOSTIC IMAGING:  I have independently reviewed the scans and discussed with the patient.   I have reviewed Venita Lick LPN's note and agree with the  documentation.  I personally performed a face-to-face visit, made revisions and my assessment and plan is as follows.    ASSESSMENT & PLAN:   Prostate cancer metastatic to bone ([place]) 1.  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 Noland Fordyce office every 6 months.  He is not able to recall when his last shot was.  -Last bone scan on 02/07/2018 shows scattered degenerative type uptake at T8 vertebral body corresponding to chronic T8 compression fracture.  Questionable abnormal uptake versus artifact at the right humeral diaphysis.  Subsequent plain x-ray of the humerus was negative. -CT abdomen pelvis on 03/24/2018 shows mild left hydronephrosis secondary to enlarging left internal iliac adenopathy.  Additional retroperitoneal and perirectal lymph nodes grossly stable in size. - Enzalutamide 160 mg daily started approximately towards the end of November 2019. -PSA on 11/02/2018 was 4.75, down to 3.47 on 12/01/2018.  We reviewed his labs.  His PSA went up to 4.09. - We will call Dr. Noland Fordyce office to find out when he received his last Lupron injection.  He reports that he has been taking enzalutamide daily. -I will see him back in 4 weeks for follow-up.  2.  Bone metastasis: -Denosumab was started on 03/05/2017.  He is tolerating it very well. -I have suggested him to take calcium twice daily.  Today  he may proceed with denosumab.  3.  CKD: -Creatinine has been stable between 1.7-1.8.  Total time spent is 25 minutes with more than 50% of the time spent counseling about adherence to medication and treatment plan and coordination of care.    Orders placed this encounter:  Orders Placed This Encounter  Procedures  . CBC with Differential/Platelet  . Comprehensive metabolic panel  . PSA      Derek Jack, MD [place] [phone]

## 2019-04-04 NOTE — Patient Instructions (Addendum)
[place] at [facility] Discharge Instructions  You were seen today by Dr. Delton Coombes. He went over your recent lab results. He will see you back in 4 weeksfor labs and follow up.  Start taking Calcium 500mg  two times a day. Take your Xtandi 4 tablets once a day.   Thank you for choosing [place] at [facility] to provide your oncology and hematology care.  To afford each patient quality time with our provider, please arrive at least 15 minutes before your scheduled appointment time.   If you have a lab appointment with the [facility] please come in thru the  Main Entrance and check in at the main information desk  You need to re-schedule your appointment should you arrive 10 or more minutes late.  We strive to give you quality time with our providers, and arriving late affects you and other patients whose appointments are after yours.  Also, if you no show three or more times for appointments you may be dismissed from the clinic at the providers discretion.     Again, thank you for choosing [facility].  Our hope is that these requests will decrease the amount of time that you wait before being seen by our physicians.       _____________________________________________________________  Should you have questions after your visit to [facility], please contact our office at [phone] between the hours of 8:00 a.m. and 4:30 p.m.  Voicemails left after 4:00 p.m. will not be returned until the following business day.  For prescription refill requests, have your pharmacy contact our office and allow 72 hours.    Cancer Center Support Programs:   > Cancer Support Group  2nd Tuesday of the month 1pm-2pm, Journey Room

## 2019-04-07 ENCOUNTER — Other Ambulatory Visit: Payer: Self-pay

## 2019-04-08 ENCOUNTER — Other Ambulatory Visit: Payer: Self-pay

## 2019-04-08 ENCOUNTER — Encounter (HOSPITAL_COMMUNITY)
Admission: RE | Admit: 2019-04-08 | Discharge: 2019-04-08 | Disposition: A | Discharge status: Home / Self Care | Payer: Medicare Other | Source: Ambulatory Visit | Attending: Ophthalmology | Admitting: Ophthalmology

## 2019-04-12 ENCOUNTER — Other Ambulatory Visit (HOSPITAL_COMMUNITY)
Admission: RE | Admit: 2019-04-12 | Discharge: 2019-04-12 | Disposition: A | Discharge status: Home / Self Care | Payer: Medicare Other | Source: Ambulatory Visit | Attending: Ophthalmology | Admitting: Ophthalmology

## 2019-04-12 ENCOUNTER — Other Ambulatory Visit: Payer: Self-pay

## 2019-04-13 ENCOUNTER — Other Ambulatory Visit: Payer: Self-pay

## 2019-04-13 ENCOUNTER — Other Ambulatory Visit: Payer: Medicare Other

## 2019-04-13 DIAGNOSIS — Z20822 Contact with and (suspected) exposure to covid-19: Secondary | ICD-10-CM

## 2019-04-13 DIAGNOSIS — R6889 Other general symptoms and signs: Secondary | ICD-10-CM | POA: Diagnosis not present

## 2019-04-14 LAB — NOVEL CORONAVIRUS, NAA: SARS-CoV-2, NAA: NOT DETECTED

## 2019-04-15 DIAGNOSIS — H25811 Combined forms of age-related cataract, right eye: Secondary | ICD-10-CM | POA: Diagnosis not present

## 2019-04-18 ENCOUNTER — Encounter (HOSPITAL_COMMUNITY)
Admission: RE | Disposition: A | Discharge status: Home / Self Care | Payer: Self-pay | Source: Home / Self Care | Attending: Ophthalmology

## 2019-04-18 ENCOUNTER — Ambulatory Visit (HOSPITAL_COMMUNITY)
Admission: RE | Admit: 2019-04-18 | Discharge: 2019-04-18 | Disposition: A | Discharge status: Home / Self Care | Payer: Medicare Other | Attending: Ophthalmology | Admitting: Ophthalmology

## 2019-04-18 ENCOUNTER — Encounter (HOSPITAL_COMMUNITY): Payer: Self-pay | Admitting: Anesthesiology

## 2019-04-18 ENCOUNTER — Other Ambulatory Visit: Payer: Self-pay

## 2019-04-18 SURGERY — PHACOEMULSIFICATION, CATARACT, WITH IOL INSERTION
Anesthesia: Monitor Anesthesia Care | Laterality: Right

## 2019-04-18 MED ORDER — PHENYLEPHRINE HCL 2.5 % OP SOLN: 1.0000 [drp] | OPHTHALMIC | Status: AC

## 2019-04-18 MED ORDER — TETRACAINE HCL 0.5 % OP SOLN: 1.0000 [drp] | OPHTHALMIC | Status: AC

## 2019-04-18 MED ORDER — LIDOCAINE HCL 3.5 % OP GEL: 1.0000 "application " | Freq: Once | OPHTHALMIC | Status: DC

## 2019-04-18 MED ORDER — CYCLOPENTOLATE-PHENYLEPHRINE 0.2-1 % OP SOLN: 1.0000 [drp] | OPHTHALMIC | Status: AC

## 2019-04-18 MED ORDER — EPINEPHRINE PF 1 MG/ML IJ SOLN
INTRAMUSCULAR | Status: AC
  Filled 2019-04-18: qty 2

## 2019-04-18 NOTE — OR Nursing (Signed)
Patient ate oatmeal this morning , need to reschedule procedure due to meal. Dr. Currie Paris and Dr Marisa Hua notified, procedure cancelled for today

## 2019-04-22 ENCOUNTER — Ambulatory Visit (HOSPITAL_COMMUNITY): Payer: Medicare Other | Admitting: Anesthesiology

## 2019-04-22 ENCOUNTER — Encounter (HOSPITAL_COMMUNITY)
Admission: RE | Disposition: A | Discharge status: Home / Self Care | Payer: Self-pay | Source: Home / Self Care | Attending: Ophthalmology

## 2019-04-22 ENCOUNTER — Encounter (HOSPITAL_COMMUNITY): Payer: Self-pay | Admitting: *Deleted

## 2019-04-22 ENCOUNTER — Ambulatory Visit (HOSPITAL_COMMUNITY)
Admission: RE | Admit: 2019-04-22 | Discharge: 2019-04-22 | Disposition: A | Discharge status: Home / Self Care | Payer: Medicare Other | Attending: Ophthalmology | Admitting: Ophthalmology

## 2019-04-22 DIAGNOSIS — Z79899 Other long term (current) drug therapy: Secondary | ICD-10-CM | POA: Insufficient documentation

## 2019-04-22 DIAGNOSIS — H259 Unspecified age-related cataract: Secondary | ICD-10-CM | POA: Insufficient documentation

## 2019-04-22 DIAGNOSIS — C7951 Secondary malignant neoplasm of bone: Secondary | ICD-10-CM | POA: Insufficient documentation

## 2019-04-22 DIAGNOSIS — Z87891 Personal history of nicotine dependence: Secondary | ICD-10-CM | POA: Diagnosis not present

## 2019-04-22 DIAGNOSIS — I10 Essential (primary) hypertension: Secondary | ICD-10-CM | POA: Diagnosis not present

## 2019-04-22 DIAGNOSIS — H25811 Combined forms of age-related cataract, right eye: Secondary | ICD-10-CM | POA: Diagnosis not present

## 2019-04-22 DIAGNOSIS — G4730 Sleep apnea, unspecified: Secondary | ICD-10-CM | POA: Insufficient documentation

## 2019-04-22 DIAGNOSIS — C61 Malignant neoplasm of prostate: Secondary | ICD-10-CM | POA: Insufficient documentation

## 2019-04-22 HISTORY — PX: CATARACT EXTRACTION W/PHACO: SHX586

## 2019-04-22 SURGERY — PHACOEMULSIFICATION, CATARACT, WITH IOL INSERTION
Anesthesia: Monitor Anesthesia Care | Site: Eye | Laterality: Right

## 2019-04-22 MED ORDER — TETRACAINE HCL 0.5 % OP SOLN
1.0000 [drp] | OPHTHALMIC | Status: AC
  Administered 2019-04-22 (×3): 1 [drp] via OPHTHALMIC

## 2019-04-22 MED ORDER — LIDOCAINE HCL 3.5 % OP GEL
1.0000 "application " | Freq: Once | OPHTHALMIC | Status: AC
  Administered 2019-04-22: 1 via OPHTHALMIC

## 2019-04-22 MED ORDER — EPINEPHRINE PF 1 MG/ML IJ SOLN
INTRAMUSCULAR | Status: AC
  Filled 2019-04-22: qty 2

## 2019-04-22 MED ORDER — POVIDONE-IODINE 5 % OP SOLN
OPHTHALMIC | Status: DC | PRN
  Administered 2019-04-22: 1 via OPHTHALMIC

## 2019-04-22 MED ORDER — PROVISC 10 MG/ML IO SOLN
INTRAOCULAR | Status: DC | PRN
  Administered 2019-04-22: 0.85 mL via INTRAOCULAR

## 2019-04-22 MED ORDER — CYCLOPENTOLATE-PHENYLEPHRINE 0.2-1 % OP SOLN
1.0000 [drp] | OPHTHALMIC | Status: AC
  Administered 2019-04-22 (×3): 1 [drp] via OPHTHALMIC

## 2019-04-22 MED ORDER — TRYPAN BLUE 0.06 % OP SOLN
OPHTHALMIC | Status: AC
  Filled 2019-04-22: qty 0.5

## 2019-04-22 MED ORDER — SODIUM CHLORIDE 0.9% FLUSH
10.0000 mL | INTRAVENOUS | Status: DC | PRN
  Administered 2019-04-22: 3 mL via INTRAVENOUS

## 2019-04-22 MED ORDER — EPINEPHRINE PF 1 MG/ML IJ SOLN
INTRAOCULAR | Status: DC | PRN
  Administered 2019-04-22: 500 mL

## 2019-04-22 MED ORDER — NEOMYCIN-POLYMYXIN-DEXAMETH 3.5-10000-0.1 OP SUSP
OPHTHALMIC | Status: DC | PRN
  Administered 2019-04-22: 1 [drp] via OPHTHALMIC

## 2019-04-22 MED ORDER — PHENYLEPHRINE HCL 2.5 % OP SOLN
1.0000 [drp] | OPHTHALMIC | Status: AC
  Administered 2019-04-22 (×3): 1 [drp] via OPHTHALMIC

## 2019-04-22 MED ORDER — BSS IO SOLN
INTRAOCULAR | Status: DC | PRN
  Administered 2019-04-22: 15 mL

## 2019-04-22 MED ORDER — LIDOCAINE HCL (PF) 1 % IJ SOLN
INTRAOCULAR | Status: DC | PRN
  Administered 2019-04-22: .9 mL via OPHTHALMIC

## 2019-04-22 MED ORDER — SODIUM HYALURONATE 23 MG/ML IO SOLN
INTRAOCULAR | Status: DC | PRN
  Administered 2019-04-22: 0.6 mL via INTRAOCULAR

## 2019-04-22 SURGICAL SUPPLY — 12 items
CLOTH BEACON ORANGE TIMEOUT ST (SAFETY) ×1 IMPLANT
EYE SHIELD UNIVERSAL CLEAR (GAUZE/BANDAGES/DRESSINGS) ×1 IMPLANT
GLOVE BIOGEL PI IND STRL 7.0 (GLOVE) IMPLANT
GLOVE BIOGEL PI INDICATOR 7.0 (GLOVE) ×2
LENS ALC ACRYL/TECN (Ophthalmic Related) ×1 IMPLANT
NDL HYPO 18GX1.5 BLUNT FILL (NEEDLE) IMPLANT
NEEDLE HYPO 18GX1.5 BLUNT FILL (NEEDLE) ×2 IMPLANT
PAD ARMBOARD 7.5X6 YLW CONV (MISCELLANEOUS) ×1 IMPLANT
SYR TB 1ML LL NO SAFETY (SYRINGE) ×1 IMPLANT
TAPE SURG TRANSPORE 1 IN (GAUZE/BANDAGES/DRESSINGS) IMPLANT
TAPE SURGICAL TRANSPORE 1 IN (GAUZE/BANDAGES/DRESSINGS) ×1
WATER STERILE IRR 250ML POUR (IV SOLUTION) ×1 IMPLANT

## 2019-04-22 NOTE — Op Note (Signed)
Date of procedure: 04/22/19  Pre-operative diagnosis: Visually significant age-related cataract, Right Eye (H25.811)  Post-operative diagnosis: Visually significant age-related cataract, Right Eye  Procedure: Removal of cataract via phacoemulsification and insertion of intra-ocular lens Wynetta Emery [employer] PCB00  +21.5D into the capsular bag of the Right Eye  Attending surgeon: Gerda Diss. Aleria Maheu, MD, MA  Anesthesia: MAC, Topical Akten  Complications: None  Estimated Blood Loss: <71m (minimal)  Specimens: None  Implants: As above  Indications:  Visually significant age-related cataract, Right Eye  Procedure:  The patient was seen and identified in the pre-operative area. The operative eye was identified and dilated.  The operative eye was marked.  Topical anesthesia was administered to the operative eye.     The patient was then to the operative suite and placed in the supine position.  A timeout was performed confirming the patient, procedure to be performed, and all other relevant information.   The patient's face was prepped and draped in the usual fashion for intra-ocular surgery.  A lid speculum was placed into the operative eye and the surgical microscope moved into place and focused.  A superotemporal paracentesis was created using a 20 gauge paracentesis blade.  Shugarcaine was injected into the anterior chamber.  Viscoelastic was injected into the anterior chamber.  A temporal clear-corneal main wound incision was created using a 2.414mmicrokeratome.  A continuous curvilinear capsulorrhexis was initiated using an irrigating cystitome and completed using capsulorrhexis forceps.  Hydrodissection and hydrodeliniation were performed.  Viscoelastic was injected into the anterior chamber.  A phacoemulsification handpiece and a chopper as a second instrument were used to remove the nucleus and epinucleus. The irrigation/aspiration handpiece was used to remove any remaining cortical  material.   The capsular bag was reinflated with viscoelastic, checked, and found to be intact.  The intraocular lens was inserted into the capsular bag and dialed into place using a Kuglen hook.  The irrigation/aspiration handpiece was used to remove any remaining viscoelastic.  The clear corneal wound and paracentesis wounds were then hydrated and checked with Weck-Cels to be watertight.  The lid-speculum and drape was removed, and the patient's face was cleaned with a wet and dry 4x4.  Maxitrol was instilled in the eye before a clear shield was taped over the eye. The patient was taken to the post-operative care unit in good condition, having tolerated the procedure well.  Post-Op Instructions: [facility] and will receive all other orders and instructions.

## 2019-04-22 NOTE — Transfer of Care (Signed)
Immediate Anesthesia Transfer of Care Note  Patient: [name]  Procedure(s) Performed: CATARACT EXTRACTION PHACO AND INTRAOCULAR LENS PLACEMENT RIGHT EYE (Right Eye)  Patient Location: Short Stay  Anesthesia Type:MAC  Level of Consciousness: awake, alert  and patient cooperative  Airway & Oxygen Therapy: Patient Spontanous Breathing  Post-op Assessment: Report given to RN and Post -op Vital signs reviewed and stable  Post vital signs: Reviewed and stable  Last Vitals:  Vitals Value Taken Time  BP    Temp    Pulse    Resp    SpO2      Last Pain:  Vitals:   04/22/19 0834  TempSrc: Oral  PainSc: 0-No pain      Patients Stated Pain Goal: 6 (09/32/35 5732)  Complications: No apparent anesthesia complications

## 2019-04-22 NOTE — Anesthesia Preprocedure Evaluation (Signed)
Anesthesia Evaluation    Airway Mallampati: II       Dental  (+) Lower Dentures, Upper Dentures   Pulmonary sleep apnea , former smoker,    breath sounds clear to auscultation       Cardiovascular hypertension,  Rhythm:regular     Neuro/Psych    GI/Hepatic PUD, GERD  ,  Endo/Other    Renal/GU      Musculoskeletal   Abdominal   Peds  Hematology   Anesthesia Other Findings Prostate bone cancer with metastatic bone ds  Reproductive/Obstetrics                             Anesthesia Physical Anesthesia Plan  ASA: III  Anesthesia Plan: MAC   Post-op Pain Management:    Induction:   PONV Risk Score and Plan:   Airway Management Planned:   Additional Equipment:   Intra-op Plan:   Post-operative Plan:   Informed Consent: I have reviewed the patients History and Physical, chart, labs and discussed the procedure including the risks, benefits and alternatives for the proposed anesthesia with the patient or authorized representative who has indicated his/her understanding and acceptance.       Plan Discussed with: Anesthesiologist  Anesthesia Plan Comments:         Anesthesia Quick Evaluation

## 2019-04-22 NOTE — Discharge Instructions (Signed)
Please discharge patient when stable, will follow up today with Dr. Carlita Whitcomb at the [facility] office immediately following discharge.  Leave shield in place until visit.  All paperwork with discharge instructions will be given at the office. ° °

## 2019-04-22 NOTE — Anesthesia Procedure Notes (Signed)
Procedure Name: MAC Date/Time: 04/22/2019 9:19 AM Performed by: Vista Deck, CRNA Pre-anesthesia Checklist: Patient identified, Emergency Drugs available, Suction available, Timeout performed and Patient being monitored Patient Re-evaluated:Patient Re-evaluated prior to induction Oxygen Delivery Method: Nasal Cannula

## 2019-04-22 NOTE — H&P (Signed)
The H and P was reviewed and updated. The patient was examined.  No changes were found after exam.  The surgical eye was marked.  

## 2019-04-22 NOTE — Anesthesia Postprocedure Evaluation (Signed)
Anesthesia Post Note  Patient: KAYLE CORREA  Procedure(s) Performed: CATARACT EXTRACTION PHACO AND INTRAOCULAR LENS PLACEMENT RIGHT EYE (Right Eye)  Patient location during evaluation: Short Stay Anesthesia Type: MAC Level of consciousness: awake and alert and patient cooperative Pain management: satisfactory to patient Vital Signs Assessment: post-procedure vital signs reviewed and stable Respiratory status: spontaneous breathing Cardiovascular status: stable Postop Assessment: no apparent nausea or vomiting Anesthetic complications: no     Last Vitals:  Vitals:   04/22/19 0834  BP: (!) 148/74  Pulse: 83  Resp: 18  Temp: 36.4 C  SpO2: 97%    Last Pain:  Vitals:   04/22/19 0834  TempSrc: Oral  PainSc: 0-No pain                 Karyna Bessler

## 2019-04-25 ENCOUNTER — Encounter (HOSPITAL_COMMUNITY): Payer: Self-pay | Admitting: Ophthalmology

## 2019-04-27 ENCOUNTER — Ambulatory Visit: Payer: Medicare Other | Admitting: Urology

## 2019-04-28 ENCOUNTER — Inpatient Hospital Stay (HOSPITAL_COMMUNITY): Payer: Medicare Other | Attending: Hematology

## 2019-04-28 ENCOUNTER — Other Ambulatory Visit: Payer: Self-pay

## 2019-04-28 DIAGNOSIS — C7951 Secondary malignant neoplasm of bone: Secondary | ICD-10-CM | POA: Diagnosis not present

## 2019-04-28 DIAGNOSIS — I10 Essential (primary) hypertension: Secondary | ICD-10-CM | POA: Diagnosis not present

## 2019-04-28 DIAGNOSIS — D509 Iron deficiency anemia, unspecified: Secondary | ICD-10-CM | POA: Diagnosis not present

## 2019-04-28 DIAGNOSIS — C61 Malignant neoplasm of prostate: Secondary | ICD-10-CM | POA: Insufficient documentation

## 2019-04-28 DIAGNOSIS — E782 Mixed hyperlipidemia: Secondary | ICD-10-CM | POA: Diagnosis not present

## 2019-04-28 DIAGNOSIS — R7301 Impaired fasting glucose: Secondary | ICD-10-CM | POA: Diagnosis not present

## 2019-04-28 DIAGNOSIS — D649 Anemia, unspecified: Secondary | ICD-10-CM | POA: Diagnosis not present

## 2019-04-28 LAB — CBC WITH DIFFERENTIAL/PLATELET
Abs Immature Granulocytes: 0.01 10*3/uL (ref 0.00–0.07)
Basophils Absolute: 0 10*3/uL (ref 0.0–0.1)
Basophils Relative: 0 %
Eosinophils Absolute: 0.1 10*3/uL (ref 0.0–0.5)
Eosinophils Relative: 2 %
HCT: 35.9 % — ABNORMAL LOW (ref 39.0–52.0)
Hemoglobin: 11.6 g/dL — ABNORMAL LOW (ref 13.0–17.0)
Immature Granulocytes: 0 %
Lymphocytes Relative: 36 %
Lymphs Abs: 2.1 10*3/uL (ref 0.7–4.0)
MCH: 28.4 pg (ref 26.0–34.0)
MCHC: 32.3 g/dL (ref 30.0–36.0)
MCV: 88 fL (ref 80.0–100.0)
Monocytes Absolute: 0.4 10*3/uL (ref 0.1–1.0)
Monocytes Relative: 6 %
Neutro Abs: 3.2 10*3/uL (ref 1.7–7.7)
Neutrophils Relative %: 56 %
Platelets: 185 10*3/uL (ref 150–400)
RBC: 4.08 MIL/uL — ABNORMAL LOW (ref 4.22–5.81)
RDW: 15.3 % (ref 11.5–15.5)
WBC: 5.9 10*3/uL (ref 4.0–10.5)
nRBC: 0 % (ref 0.0–0.2)

## 2019-04-28 LAB — COMPREHENSIVE METABOLIC PANEL
ALT: 16 U/L (ref 0–44)
AST: 22 U/L (ref 15–41)
Albumin: 3.9 g/dL (ref 3.5–5.0)
Alkaline Phosphatase: 61 U/L (ref 38–126)
Anion gap: 11 (ref 5–15)
BUN: 23 mg/dL (ref 8–23)
CO2: 26 mmol/L (ref 22–32)
Calcium: 10 mg/dL (ref 8.9–10.3)
Chloride: 102 mmol/L (ref 98–111)
Creatinine, Ser: 1.93 mg/dL — ABNORMAL HIGH (ref 0.61–1.24)
GFR calc Af Amer: 37 mL/min — ABNORMAL LOW (ref >60)
GFR calc non Af Amer: 32 mL/min — ABNORMAL LOW (ref >60)
Glucose, Bld: 87 mg/dL (ref 70–99)
Potassium: 3.9 mmol/L (ref 3.5–5.1)
Sodium: 139 mmol/L (ref 135–145)
Total Bilirubin: 0.7 mg/dL (ref 0.3–1.2)
Total Protein: 7.1 g/dL (ref 6.5–8.1)

## 2019-04-28 LAB — PSA: Prostatic Specific Antigen: 6.93 ng/mL — ABNORMAL HIGH (ref 0.00–4.00)

## 2019-05-02 ENCOUNTER — Other Ambulatory Visit (HOSPITAL_COMMUNITY): Payer: Medicare Other

## 2019-05-04 ENCOUNTER — Other Ambulatory Visit (HOSPITAL_COMMUNITY): Payer: Medicare Other

## 2019-05-05 ENCOUNTER — Ambulatory Visit (HOSPITAL_COMMUNITY): Payer: Medicare Other

## 2019-05-05 ENCOUNTER — Ambulatory Visit (HOSPITAL_COMMUNITY): Payer: Medicare Other | Admitting: Hematology

## 2019-05-06 DIAGNOSIS — H25812 Combined forms of age-related cataract, left eye: Secondary | ICD-10-CM | POA: Diagnosis not present

## 2019-05-09 ENCOUNTER — Other Ambulatory Visit: Payer: Self-pay

## 2019-05-09 ENCOUNTER — Other Ambulatory Visit (HOSPITAL_COMMUNITY): Payer: Self-pay

## 2019-05-09 ENCOUNTER — Other Ambulatory Visit (HOSPITAL_COMMUNITY): Payer: Self-pay | Admitting: Hematology

## 2019-05-09 ENCOUNTER — Encounter (HOSPITAL_COMMUNITY)
Admission: RE | Admit: 2019-05-09 | Discharge: 2019-05-09 | Disposition: A | Discharge status: Home / Self Care | Payer: Medicare Other | Source: Ambulatory Visit | Attending: Ophthalmology | Admitting: Ophthalmology

## 2019-05-09 ENCOUNTER — Inpatient Hospital Stay (HOSPITAL_COMMUNITY): Payer: Medicare Other

## 2019-05-09 VITALS — BP 152/57 | HR 60 | Temp 97.3°F | Resp 18

## 2019-05-09 DIAGNOSIS — I10 Essential (primary) hypertension: Secondary | ICD-10-CM | POA: Diagnosis not present

## 2019-05-09 DIAGNOSIS — C61 Malignant neoplasm of prostate: Secondary | ICD-10-CM

## 2019-05-09 DIAGNOSIS — E782 Mixed hyperlipidemia: Secondary | ICD-10-CM | POA: Diagnosis not present

## 2019-05-09 DIAGNOSIS — Z01812 Encounter for preprocedural laboratory examination: Secondary | ICD-10-CM | POA: Insufficient documentation

## 2019-05-09 DIAGNOSIS — N183 Chronic kidney disease, stage 3 (moderate): Secondary | ICD-10-CM | POA: Diagnosis not present

## 2019-05-09 DIAGNOSIS — R7301 Impaired fasting glucose: Secondary | ICD-10-CM | POA: Diagnosis not present

## 2019-05-09 DIAGNOSIS — C7951 Secondary malignant neoplasm of bone: Secondary | ICD-10-CM

## 2019-05-09 DIAGNOSIS — D631 Anemia in chronic kidney disease: Secondary | ICD-10-CM | POA: Diagnosis not present

## 2019-05-09 LAB — COMPREHENSIVE METABOLIC PANEL
ALT: 15 U/L (ref 0–44)
AST: 23 U/L (ref 15–41)
Albumin: 4 g/dL (ref 3.5–5.0)
Alkaline Phosphatase: 62 U/L (ref 38–126)
Anion gap: 13 (ref 5–15)
BUN: 25 mg/dL — ABNORMAL HIGH (ref 8–23)
CO2: 25 mmol/L (ref 22–32)
Calcium: 9.8 mg/dL (ref 8.9–10.3)
Chloride: 101 mmol/L (ref 98–111)
Creatinine, Ser: 1.81 mg/dL — ABNORMAL HIGH (ref 0.61–1.24)
GFR calc Af Amer: 40 mL/min — ABNORMAL LOW (ref >60)
GFR calc non Af Amer: 35 mL/min — ABNORMAL LOW (ref >60)
Glucose, Bld: 87 mg/dL (ref 70–99)
Potassium: 3.9 mmol/L (ref 3.5–5.1)
Sodium: 139 mmol/L (ref 135–145)
Total Bilirubin: 0.6 mg/dL (ref 0.3–1.2)
Total Protein: 7.4 g/dL (ref 6.5–8.1)

## 2019-05-09 MED ORDER — DENOSUMAB 120 MG/1.7ML ~~LOC~~ SOLN
120.0000 mg | Freq: Once | SUBCUTANEOUS | Status: AC
  Administered 2019-05-09: 120 mg via SUBCUTANEOUS

## 2019-05-09 NOTE — Progress Notes (Signed)
To treatment room for xgeva shot.  Taking calcium as directed.  Denied tooth, jaw, and leg pain.  Ok to give xgeva with recent cataract surgery verbal order Dr. Delton Coombes.   Patient tolerated injection with no complaints voiced.  Site clean and dry with no bruising or swelling noted at site.  Band aid applied.  Vss with discharge and left ambulatory with no s/s of distress noted.

## 2019-05-10 ENCOUNTER — Other Ambulatory Visit: Payer: Self-pay

## 2019-05-10 ENCOUNTER — Other Ambulatory Visit: Payer: Medicare Other

## 2019-05-10 DIAGNOSIS — Z20822 Contact with and (suspected) exposure to covid-19: Secondary | ICD-10-CM

## 2019-05-10 DIAGNOSIS — R6889 Other general symptoms and signs: Secondary | ICD-10-CM | POA: Diagnosis not present

## 2019-05-10 MED FILL — XTANDI 40 MG CAPSULE: 40 | 30 days supply | Qty: 120 | Fill #0

## 2019-05-11 ENCOUNTER — Other Ambulatory Visit (HOSPITAL_COMMUNITY)
Admission: RE | Admit: 2019-05-11 | Discharge: 2019-05-11 | Disposition: A | Discharge status: Home / Self Care | Payer: Medicare Other | Source: Ambulatory Visit | Attending: Ophthalmology | Admitting: Ophthalmology

## 2019-05-11 ENCOUNTER — Other Ambulatory Visit: Payer: Self-pay

## 2019-05-12 NOTE — OR Nursing (Signed)
Ok to proceed with procedure , confirm that patient is asymptomatic , self quarantine, since test performed . Per [employer]

## 2019-05-13 ENCOUNTER — Ambulatory Visit (HOSPITAL_COMMUNITY): Payer: Medicare Other | Admitting: Anesthesiology

## 2019-05-13 ENCOUNTER — Encounter (HOSPITAL_COMMUNITY): Payer: Self-pay

## 2019-05-13 ENCOUNTER — Other Ambulatory Visit: Payer: Self-pay

## 2019-05-13 ENCOUNTER — Ambulatory Visit (HOSPITAL_COMMUNITY)
Admission: RE | Admit: 2019-05-13 | Discharge: 2019-05-13 | Disposition: A | Discharge status: Home / Self Care | Payer: Medicare Other | Attending: Ophthalmology | Admitting: Ophthalmology

## 2019-05-13 ENCOUNTER — Encounter (HOSPITAL_COMMUNITY)
Admission: RE | Disposition: A | Discharge status: Home / Self Care | Payer: Self-pay | Source: Home / Self Care | Attending: Ophthalmology

## 2019-05-13 DIAGNOSIS — I10 Essential (primary) hypertension: Secondary | ICD-10-CM | POA: Diagnosis not present

## 2019-05-13 DIAGNOSIS — H2512 Age-related nuclear cataract, left eye: Secondary | ICD-10-CM | POA: Diagnosis not present

## 2019-05-13 DIAGNOSIS — Z87891 Personal history of nicotine dependence: Secondary | ICD-10-CM | POA: Insufficient documentation

## 2019-05-13 DIAGNOSIS — H259 Unspecified age-related cataract: Secondary | ICD-10-CM | POA: Insufficient documentation

## 2019-05-13 DIAGNOSIS — H25812 Combined forms of age-related cataract, left eye: Secondary | ICD-10-CM | POA: Diagnosis not present

## 2019-05-13 HISTORY — PX: CATARACT EXTRACTION W/PHACO: SHX586

## 2019-05-13 SURGERY — PHACOEMULSIFICATION, CATARACT, WITH IOL INSERTION
Anesthesia: Monitor Anesthesia Care | Site: Eye | Laterality: Left

## 2019-05-13 MED ORDER — TETRACAINE 0.5 % OP SOLN OPTIME - NO CHARGE
OPHTHALMIC | Status: DC | PRN
  Administered 2019-05-13: 2 [drp] via OPHTHALMIC

## 2019-05-13 MED ORDER — SODIUM HYALURONATE 23 MG/ML IO SOLN
INTRAOCULAR | Status: DC | PRN
  Administered 2019-05-13: 0.6 mL via INTRAOCULAR

## 2019-05-13 MED ORDER — BSS IO SOLN
INTRAOCULAR | Status: DC | PRN
  Administered 2019-05-13: 15 mL

## 2019-05-13 MED ORDER — EPINEPHRINE PF 1 MG/ML IJ SOLN
INTRAOCULAR | Status: DC | PRN
  Administered 2019-05-13: 500 mL

## 2019-05-13 MED ORDER — POVIDONE-IODINE 5 % OP SOLN
OPHTHALMIC | Status: DC | PRN
  Administered 2019-05-13: 1 via OPHTHALMIC

## 2019-05-13 MED ORDER — NEOMYCIN-POLYMYXIN-DEXAMETH 3.5-10000-0.1 OP SUSP
OPHTHALMIC | Status: DC | PRN
  Administered 2019-05-13: 1 [drp] via OPHTHALMIC

## 2019-05-13 MED ORDER — CYCLOPENTOLATE-PHENYLEPHRINE 0.2-1 % OP SOLN
1.0000 [drp] | OPHTHALMIC | Status: AC
  Administered 2019-05-13 (×3): 1 [drp] via OPHTHALMIC

## 2019-05-13 MED ORDER — PROVISC 10 MG/ML IO SOLN
INTRAOCULAR | Status: DC | PRN
  Administered 2019-05-13: 0.85 mL via INTRAOCULAR

## 2019-05-13 MED ORDER — LIDOCAINE HCL 3.5 % OP GEL
1.0000 "application " | Freq: Once | OPHTHALMIC | Status: AC
  Administered 2019-05-13: 1 via OPHTHALMIC

## 2019-05-13 MED ORDER — TETRACAINE HCL 0.5 % OP SOLN
1.0000 [drp] | OPHTHALMIC | Status: AC
  Administered 2019-05-13 (×3): 1 [drp] via OPHTHALMIC

## 2019-05-13 MED ORDER — PHENYLEPHRINE HCL 2.5 % OP SOLN
1.0000 [drp] | OPHTHALMIC | Status: AC
  Administered 2019-05-13 (×3): 1 [drp] via OPHTHALMIC

## 2019-05-13 SURGICAL SUPPLY — 13 items

## 2019-05-13 NOTE — Transfer of Care (Signed)
Immediate Anesthesia Transfer of Care Note  Patient: [name]  Procedure(s) Performed: CATARACT EXTRACTION PHACO AND INTRAOCULAR LENS PLACEMENT LEFT EYE (Left Eye)  Patient Location: PACU  Anesthesia Type:MAC  Level of Consciousness: awake, alert  and oriented  Airway & Oxygen Therapy: Patient Spontanous Breathing  Post-op Assessment: Report given to RN and Post -op Vital signs reviewed and stable  Post vital signs: Reviewed and stable  Last Vitals:  Vitals Value Taken Time  BP    Temp    Pulse    Resp    SpO2      Last Pain:  Vitals:   05/13/19 0933  TempSrc: Oral  PainSc: 0-No pain      Patients Stated Pain Goal: 6 (96/28/36 6294)  Complications: No apparent anesthesia complications

## 2019-05-13 NOTE — Op Note (Signed)
Date of procedure: 05/13/19  Pre-operative diagnosis: Visually significant age-related cataract, Left Eye (H25.812)  Post-operative diagnosis: Visually significant age-related cataract, Left Eye  Procedure: Removal of cataract via phacoemulsification and insertion of intra-ocular lens [employer] PCB00  +21.5D into the capsular bag of the Left Eye  Attending surgeon: Gerda Diss. Junita Kubota, MD, MA  Anesthesia: MAC, Topical Akten  Complications: None  Estimated Blood Loss: <60m (minimal)  Specimens: None  Implants: As above  Indications:  Visually significant age-related cataract, Left Eye  Procedure:  The patient was seen and identified in the pre-operative area. The operative eye was identified and dilated.  The operative eye was marked.  Topical anesthesia was administered to the operative eye.     The patient was then to the operative suite and placed in the supine position.  A timeout was performed confirming the patient, procedure to be performed, and all other relevant information.   The patient's face was prepped and draped in the usual fashion for intra-ocular surgery.  A lid speculum was placed into the operative eye and the surgical microscope moved into place and focused.  An inferotemporal paracentesis was created using a 20 gauge paracentesis blade.  Shugarcaine was injected into the anterior chamber.  Viscoelastic was injected into the anterior chamber.  A temporal clear-corneal main wound incision was created using a 2.432mmicrokeratome.  A continuous curvilinear capsulorrhexis was initiated using an irrigating cystitome and completed using capsulorrhexis forceps.  Hydrodissection and hydrodeliniation were performed.  Viscoelastic was injected into the anterior chamber.  A phacoemulsification handpiece and a chopper as a second instrument were used to remove the nucleus and epinucleus. The irrigation/aspiration handpiece was used to remove any remaining cortical  material.   The capsular bag was reinflated with viscoelastic, checked, and found to be intact.  The intraocular lens was inserted into the capsular bag and dialed into place using a Kuglen hook.  The irrigation/aspiration handpiece was used to remove any remaining viscoelastic.  The clear corneal wound and paracentesis wounds were then hydrated and checked with Weck-Cels to be watertight.  The lid-speculum and drape was removed, and the patient's face was cleaned with a wet and dry 4x4.  Maxitrol was instilled in the eye before a clear shield was taped over the eye. The patient was taken to the post-operative care unit in good condition, having tolerated the procedure well.  Post-Op Instructions: [facility] and will receive all other orders and instructions.

## 2019-05-13 NOTE — Discharge Instructions (Signed)
Please discharge patient when stable, will follow up today with Dr. Xzayvier Fagin at the [facility] office immediately following discharge.  Leave shield in place until visit.  All paperwork with discharge instructions will be given at the office. ° °

## 2019-05-13 NOTE — Anesthesia Postprocedure Evaluation (Signed)
Anesthesia Post Note  Patient: [name]  Procedure(s) Performed: CATARACT EXTRACTION PHACO AND INTRAOCULAR LENS PLACEMENT LEFT EYE (Left Eye)  Patient location during evaluation: PACU Anesthesia Type: MAC Level of consciousness: awake and alert Pain management: pain level controlled Vital Signs Assessment: post-procedure vital signs reviewed and stable Respiratory status: spontaneous breathing, nonlabored ventilation, respiratory function stable and patient connected to nasal cannula oxygen Cardiovascular status: stable and blood pressure returned to baseline Postop Assessment: no apparent nausea or vomiting Anesthetic complications: no     Last Vitals:  Vitals:   05/13/19 0933  BP: 135/65  Pulse: 61  Resp: 16  Temp: 36.9 C  SpO2: 97%    Last Pain:  Vitals:   05/13/19 0933  TempSrc: Oral  PainSc: 0-No pain                 Ibrahim Mcpheeters

## 2019-05-13 NOTE — H&P (Signed)
The H and P was reviewed and updated. The patient was examined.  No changes were found after exam.  The surgical eye was marked.  

## 2019-05-13 NOTE — Anesthesia Preprocedure Evaluation (Signed)
Anesthesia Evaluation  Patient identified by MRN, date of birth, ID band Patient awake    Reviewed: Allergy & Precautions, H&P , NPO status , Patient's Chart, lab work & pertinent test results, reviewed documented beta blocker date and time   Airway Mallampati: II       Dental no notable dental hx.    Pulmonary neg pulmonary ROS, former smoker,    Pulmonary exam normal        Cardiovascular hypertension, Normal cardiovascular exam     Neuro/Psych negative neurological ROS  negative psych ROS   GI/Hepatic PUD, GERD  ,  Endo/Other  negative endocrine ROS  Renal/GU   negative genitourinary   Musculoskeletal   Abdominal   Peds  Hematology   Anesthesia Other Findings   Reproductive/Obstetrics negative OB ROS                             Anesthesia Physical Anesthesia Plan  ASA: III  Anesthesia Plan: MAC   Post-op Pain Management:    Induction:   PONV Risk Score and Plan: 1 and TIVA  Airway Management Planned:   Additional Equipment:   Intra-op Plan:   Post-operative Plan:   Informed Consent: I have reviewed the patients History and Physical, chart, labs and discussed the procedure including the risks, benefits and alternatives for the proposed anesthesia with the patient or authorized representative who has indicated his/her understanding and acceptance.       Plan Discussed with: CRNA  Anesthesia Plan Comments:         Anesthesia Quick Evaluation

## 2019-05-14 LAB — NOVEL CORONAVIRUS, NAA: SARS-CoV-2, NAA: NOT DETECTED

## 2019-05-16 ENCOUNTER — Encounter (HOSPITAL_COMMUNITY): Payer: Self-pay | Admitting: Ophthalmology

## 2019-05-24 DIAGNOSIS — Z0000 Encounter for general adult medical examination without abnormal findings: Secondary | ICD-10-CM | POA: Diagnosis not present

## 2019-05-30 ENCOUNTER — Other Ambulatory Visit (HOSPITAL_COMMUNITY): Payer: Self-pay | Admitting: Hematology

## 2019-05-30 DIAGNOSIS — C61 Malignant neoplasm of prostate: Secondary | ICD-10-CM

## 2019-05-30 DIAGNOSIS — C7951 Secondary malignant neoplasm of bone: Secondary | ICD-10-CM

## 2019-06-06 ENCOUNTER — Other Ambulatory Visit: Payer: Self-pay

## 2019-06-06 ENCOUNTER — Inpatient Hospital Stay (HOSPITAL_COMMUNITY): Payer: Medicare Other | Attending: Hematology

## 2019-06-06 DIAGNOSIS — Z79899 Other long term (current) drug therapy: Secondary | ICD-10-CM | POA: Insufficient documentation

## 2019-06-06 DIAGNOSIS — C7951 Secondary malignant neoplasm of bone: Secondary | ICD-10-CM | POA: Insufficient documentation

## 2019-06-06 DIAGNOSIS — C61 Malignant neoplasm of prostate: Secondary | ICD-10-CM | POA: Insufficient documentation

## 2019-06-06 DIAGNOSIS — Z87891 Personal history of nicotine dependence: Secondary | ICD-10-CM | POA: Insufficient documentation

## 2019-06-06 DIAGNOSIS — I10 Essential (primary) hypertension: Secondary | ICD-10-CM | POA: Insufficient documentation

## 2019-06-06 DIAGNOSIS — G4730 Sleep apnea, unspecified: Secondary | ICD-10-CM | POA: Insufficient documentation

## 2019-06-06 DIAGNOSIS — N189 Chronic kidney disease, unspecified: Secondary | ICD-10-CM | POA: Diagnosis not present

## 2019-06-06 DIAGNOSIS — E785 Hyperlipidemia, unspecified: Secondary | ICD-10-CM | POA: Insufficient documentation

## 2019-06-06 LAB — COMPREHENSIVE METABOLIC PANEL
ALT: 16 U/L (ref 0–44)
AST: 24 U/L (ref 15–41)
Albumin: 3.9 g/dL (ref 3.5–5.0)
Alkaline Phosphatase: 62 U/L (ref 38–126)
Anion gap: 11 (ref 5–15)
BUN: 14 mg/dL (ref 8–23)
CO2: 24 mmol/L (ref 22–32)
Calcium: 9 mg/dL (ref 8.9–10.3)
Chloride: 104 mmol/L (ref 98–111)
Creatinine, Ser: 1.68 mg/dL — ABNORMAL HIGH (ref 0.61–1.24)
GFR calc Af Amer: 44 mL/min — ABNORMAL LOW (ref >60)
GFR calc non Af Amer: 38 mL/min — ABNORMAL LOW (ref >60)
Glucose, Bld: 100 mg/dL — ABNORMAL HIGH (ref 70–99)
Potassium: 4.1 mmol/L (ref 3.5–5.1)
Sodium: 139 mmol/L (ref 135–145)
Total Bilirubin: 0.4 mg/dL (ref 0.3–1.2)
Total Protein: 6.8 g/dL (ref 6.5–8.1)

## 2019-06-06 LAB — CBC WITH DIFFERENTIAL/PLATELET
Abs Immature Granulocytes: 0.02 10*3/uL (ref 0.00–0.07)
Basophils Absolute: 0 10*3/uL (ref 0.0–0.1)
Basophils Relative: 0 %
Eosinophils Absolute: 0.1 10*3/uL (ref 0.0–0.5)
Eosinophils Relative: 2 %
HCT: 35.6 % — ABNORMAL LOW (ref 39.0–52.0)
Hemoglobin: 11.5 g/dL — ABNORMAL LOW (ref 13.0–17.0)
Immature Granulocytes: 0 %
Lymphocytes Relative: 31 %
Lymphs Abs: 1.7 10*3/uL (ref 0.7–4.0)
MCH: 28.3 pg (ref 26.0–34.0)
MCHC: 32.3 g/dL (ref 30.0–36.0)
MCV: 87.7 fL (ref 80.0–100.0)
Monocytes Absolute: 0.3 10*3/uL (ref 0.1–1.0)
Monocytes Relative: 5 %
Neutro Abs: 3.3 10*3/uL (ref 1.7–7.7)
Neutrophils Relative %: 62 %
Platelets: 179 10*3/uL (ref 150–400)
RBC: 4.06 MIL/uL — ABNORMAL LOW (ref 4.22–5.81)
RDW: 14.9 % (ref 11.5–15.5)
WBC: 5.4 10*3/uL (ref 4.0–10.5)
nRBC: 0 % (ref 0.0–0.2)

## 2019-06-06 LAB — PSA: Prostatic Specific Antigen: 9.37 ng/mL — ABNORMAL HIGH (ref 0.00–4.00)

## 2019-06-07 ENCOUNTER — Inpatient Hospital Stay (HOSPITAL_COMMUNITY): Payer: Medicare Other

## 2019-06-07 ENCOUNTER — Inpatient Hospital Stay (HOSPITAL_BASED_OUTPATIENT_CLINIC_OR_DEPARTMENT_OTHER): Payer: Medicare Other | Admitting: Hematology

## 2019-06-07 ENCOUNTER — Encounter (HOSPITAL_COMMUNITY): Payer: Self-pay | Admitting: Hematology

## 2019-06-07 ENCOUNTER — Other Ambulatory Visit: Payer: Self-pay

## 2019-06-07 ENCOUNTER — Other Ambulatory Visit (HOSPITAL_COMMUNITY): Payer: Medicare Other

## 2019-06-07 VITALS — BP 130/56 | HR 72 | Temp 97.3°F | Resp 16 | Wt 273.4 lb

## 2019-06-07 DIAGNOSIS — G4730 Sleep apnea, unspecified: Secondary | ICD-10-CM | POA: Diagnosis not present

## 2019-06-07 DIAGNOSIS — C61 Malignant neoplasm of prostate: Secondary | ICD-10-CM

## 2019-06-07 DIAGNOSIS — I10 Essential (primary) hypertension: Secondary | ICD-10-CM | POA: Diagnosis not present

## 2019-06-07 DIAGNOSIS — N189 Chronic kidney disease, unspecified: Secondary | ICD-10-CM | POA: Diagnosis not present

## 2019-06-07 DIAGNOSIS — C7951 Secondary malignant neoplasm of bone: Secondary | ICD-10-CM | POA: Diagnosis not present

## 2019-06-07 DIAGNOSIS — E785 Hyperlipidemia, unspecified: Secondary | ICD-10-CM | POA: Diagnosis not present

## 2019-06-07 DIAGNOSIS — Z87891 Personal history of nicotine dependence: Secondary | ICD-10-CM | POA: Diagnosis not present

## 2019-06-07 DIAGNOSIS — Z79899 Other long term (current) drug therapy: Secondary | ICD-10-CM | POA: Diagnosis not present

## 2019-06-07 MED ORDER — DENOSUMAB 120 MG/1.7ML ~~LOC~~ SOLN
120.0000 mg | Freq: Once | SUBCUTANEOUS | Status: AC
  Administered 2019-06-07: 120 mg via SUBCUTANEOUS

## 2019-06-07 MED ORDER — DENOSUMAB 120 MG/1.7ML ~~LOC~~ SOLN
SUBCUTANEOUS | Status: AC
  Filled 2019-06-07: qty 1.7

## 2019-06-07 NOTE — Assessment & Plan Note (Addendum)
1.  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 Noland Fordyce office every 6 months.  He is not able to recall when his last shot was.  -Last bone scan on 02/07/2018 shows scattered degenerative type uptake at T8 vertebral body corresponding to chronic T8 compression fracture.  Questionable abnormal uptake versus artifact at the right humeral diaphysis.  Subsequent plain x-ray of the humerus was negative. -CT abdomen pelvis on 03/24/2018 shows mild left hydronephrosis secondary to enlarging left internal iliac adenopathy.  Additional retroperitoneal and perirectal lymph nodes grossly stable in size. - Enzalutamide 160 mg daily started approximately towards the end of November 2019. -PSA on 11/02/2018 was 4.75, down to 3.47 on 12/01/2018.   - His PSA continues to trend down now at 9.37.  Have recommended we proceed with bone scan as well as CT chest abdomen pelvis to assess for disease progression.  - Patient may be a candidate for tumor testing for MSI.  Would also recommend germline testing. -If disease progression is confirmed patient would likely benefit from docetaxel. -He will return to clinic in 1 month.  2.  Bone metastasis: -Denosumab was started on 03/05/2017.  He is tolerating it very well. -I have suggested him to take calcium twice daily.  Today he may proceed with denosumab.  3.  CKD: -Creatinine has been stable between 1.7-1.8.  4.  Hard of hearing -Have referred patient to audiology for evaluation.

## 2019-06-07 NOTE — Progress Notes (Signed)
Patient tolerated injection with no complaints voiced.  Denied tooth, jaw, and leg pain.  Taking calcium as directed.  Site clean and dry with no bruising or swelling noted at site.  Band aid applied.  Vss with discharge and left ambulatory with no s/s of distress noted.

## 2019-06-07 NOTE — Progress Notes (Signed)
[place] [place], [place] [address]   CLINIC:  Medical Oncology/Hematology  PCP:  Celene Squibb, MD [place] [address] [phone]   REASON FOR VISIT:  Follow-up for Prostate Cancer   CURRENT THERAPY: [name]    INTERVAL HISTORY:  [name] 78 y.o. male presents today for follow-up.  Reports overall doing well.  He denies any significant fatigue.  He is currently on Xtandi daily, tolerating well.  He denies any new bone pains.  Denies any hematuria.  No change in weight.  Appetite is stable.  No chest pain or shortness of breath.  No recent hospitalizations or infections.  He is requesting a referral to audiology for difficulty hearing.   REVIEW OF SYSTEMS:  Review of Systems  Constitutional: Negative.   HENT:  Negative.   Eyes: Negative.   Respiratory: Negative.   Cardiovascular: Negative.   Gastrointestinal: Negative.   Endocrine: Negative.   Genitourinary: Negative.    Musculoskeletal: Positive for arthralgias, back pain and myalgias.  Skin: Negative.   Neurological: Negative.   Hematological: Negative.   Psychiatric/Behavioral: Negative.      PAST MEDICAL/SURGICAL HISTORY:  Past Medical History:  Diagnosis Date  . Cancer [facility])    prostate  . GERD (gastroesophageal reflux disease)   . Hyperlipidemia   . Hypertension   . Prostate cancer metastatic to bone ([place]) 04/04/2016  . Sleep apnea 10/24/2016   Past Surgical History:  Procedure Laterality Date  . CATARACT EXTRACTION W/PHACO Right 04/22/2019   Procedure: CATARACT EXTRACTION PHACO AND INTRAOCULAR LENS PLACEMENT RIGHT EYE;  Surgeon: Baruch Goldmann, MD;  Location: [facility];  Service: Ophthalmology;  Laterality: Right;  right  . CATARACT EXTRACTION W/PHACO Left 05/13/2019   Procedure: CATARACT EXTRACTION PHACO AND INTRAOCULAR LENS PLACEMENT LEFT EYE;  Surgeon: Baruch Goldmann, MD;  Location: [facility];  Service: Ophthalmology;  Laterality: Left;  left  .  CHOLECYSTECTOMY    . HAND SURGERY    . INGUINAL HERNIA REPAIR Left   . PROSTATECTOMY    . PROSTATECTOMY    . radical prostatectomy    . TONSILLECTOMY       SOCIAL HISTORY:  Social History   Socioeconomic History  . Marital status: Widowed    Spouse name: Not on file  . Number of children: 3  . Years of education: 9  . Highest education level: Not on file  Occupational History  . Occupation: Animator- retired    Comment: [employer]  Social Needs  . Financial resource strain: Not on file  . Food insecurity    Worry: Not on file    Inability: Not on file  . Transportation needs    Medical: Not on file    Non-medical: Not on file  Tobacco Use  . Smoking status: Former Smoker    Packs/day: 1.00    Years: 32.00    Pack years: 32.00    Types: Cigarettes    Quit date: 02/04/1989    Years since quitting: 30.3  . Smokeless tobacco: Never Used  Substance and Sexual Activity  . Alcohol use: No  . Drug use: No  . Sexual activity: Not Currently  Lifestyle  . Physical activity    Days per week: Not on file    Minutes per session: Not on file  . Stress: Not on file  Relationships  . Social Herbalist on phone: Not on file    Gets together: Not on file  Attends religious service: Not on file    Active member of club or organization: Not on file    Attends meetings of clubs or organizations: Not on file    Relationship status: Not on file  . Intimate partner violence    Fear of current or ex partner: Not on file    Emotionally abused: Not on file    Physically abused: Not on file    Forced sexual activity: Not on file  Other Topics Concern  . Not on file  Social History Narrative   Widowed   [place] with son   Chronic low back pain    FAMILY HISTORY:  Family History  Problem Relation Age of Onset  . Heart failure Mother        CHF  . Prostate cancer Father   . Narcolepsy Sister   . Cancer Daughter     CURRENT MEDICATIONS:  Outpatient  Encounter Medications as of 06/07/2019  Medication Sig Note  . benazepril (LOTENSIN) 20 MG tablet TAKE ONE TABLET BY MOUTH DAILY.   . calcium-vitamin D (OSCAL WITH D) 500-200 MG-UNIT TABS tablet Take 1 tablet by mouth daily.   . cetirizine (ZYRTEC) 10 MG tablet TAKE ONE TABLET BY MOUTH DAILY.   . clotrimazole-betamethasone (LOTRISONE) cream Apply 1 application topically 2 (two) times daily.   . Ferrous Sulfate (IRON) 325 (65 Fe) MG TABS Take 1 tablet by mouth daily.   . fexofenadine (ALLEGRA) 180 MG tablet Take 180 mg by mouth daily. 11/02/2018: Not sure which allergy medication he is currently taking   . fluticasone (FLONASE) 50 MCG/ACT nasal spray USE ONE SPRAY IN EACH NOSTRIL ONCE DAILY. SHAKE GENTLY BEFORE USING.   . furosemide (LASIX) 40 MG tablet TAKE ONE TABLET BY MOUTH DAILY. TAKE WITH POTASSIUM.   Marland Kitchen hydrochlorothiazide (HYDRODIURIL) 25 MG tablet TAKE ONE TABLET BY MOUTH DAILY.   Marland Kitchen ILEVRO 0.3 % ophthalmic suspension INSTILL ONE DROP IN THE OPERATIVE EYE DAILY. START TWO DAYS BEFORE SURGERY AND CONTINUE AS DIRECTED.   Marland Kitchen ketoconazole (NIZORAL) 2 % cream APPLY A SMALL AMOUNT TO FACE RASH TWICE DAILY FOR UP TO FOUR WEEKS IF NEEDED.   Marland Kitchen losartan (COZAAR) 100 MG tablet Take 100 mg by mouth daily.   . meloxicam (MOBIC) 7.5 MG tablet Take 1 tablet (7.5 mg total) daily by mouth.   . methylphenidate (RITALIN) 20 MG tablet Take 20 mg by mouth daily.   . mirabegron ER (MYRBETRIQ) 25 MG TB24 tablet Take by mouth.   . moxifloxacin (VIGAMOX) 0.5 % ophthalmic solution INSTILL ONE DROP THREE TIMES DAILY IN THE OPERATIVE EYE. START TWO DAYS BEFORE SURGERY AND CONTINUE AS DIRECTED.   Marland Kitchen potassium chloride SA (K-DUR,KLOR-CON) 20 MEQ tablet Take 20 mEq by mouth daily.   . prednisoLONE acetate (PRED FORTE) 1 % ophthalmic suspension INSTILL ONE DROP IN THE OPERATIVE EYE THREE TIMES DAILY. START ONE DAY AFTER SURGERY AND CONTINUE AS DIRECTED.   Marland Kitchen selenium sulfide (SELSUN) 2.5 % shampoo Apply 1 application topically  daily as needed for irritation.   . traMADol (ULTRAM) 50 MG tablet TAKE ONE TABLET BY MOUTH EVERY 6 HOURS AS NEEDED FOR PAIN   . XTANDI 40 MG capsule TAKE 4 CAPSULES (160 MG TOTAL) BY MOUTH DAILY.    No facility-administered encounter medications on file as of 06/07/2019.     ALLERGIES:  No Known Allergies   PHYSICAL EXAM:  ECOG Performance status: 2  Vitals:   06/07/19 1100  BP: (!) 130/56  Pulse: 72  Resp:  16  Temp: (!) 97.3 F (36.3 C)  SpO2: 99%   Filed Weights   06/07/19 1100  Weight: 273 lb 7 oz (124 kg)    Physical Exam Constitutional:      Appearance: Normal appearance. He is obese.  HENT:     Head: Normocephalic.     Right Ear: External ear normal.     Left Ear: External ear normal.     Nose: Nose normal.     Mouth/Throat:     Mouth: Mucous membranes are moist.     Pharynx: Oropharynx is clear.  Eyes:     Extraocular Movements: Extraocular movements intact.     Conjunctiva/sclera: Conjunctivae normal.  Neck:     Musculoskeletal: Normal range of motion.  Cardiovascular:     Rate and Rhythm: Normal rate and regular rhythm.     Pulses: Normal pulses.     Heart sounds: Normal heart sounds.  Pulmonary:     Effort: Pulmonary effort is normal.     Breath sounds: Normal breath sounds.  Abdominal:     General: Bowel sounds are normal.     Palpations: Abdomen is soft.  Musculoskeletal: Normal range of motion.  Skin:    General: Skin is warm and dry.  Neurological:     General: No focal deficit present.     Mental Status: He is alert and oriented to person, place, and time.  Psychiatric:        Mood and Affect: Mood normal.        Behavior: Behavior normal.        Thought Content: Thought content normal.        Judgment: Judgment normal.      LABORATORY DATA:  I have reviewed the labs as listed.  CBC    Component Value Date/Time   WBC 5.4 06/06/2019 0900   RBC 4.06 (L) 06/06/2019 0900   HGB 11.5 (L) 06/06/2019 0900   HCT 35.6 (L) 06/06/2019  0900   PLT 179 06/06/2019 0900   MCV 87.7 06/06/2019 0900   MCH 28.3 06/06/2019 0900   MCHC 32.3 06/06/2019 0900   RDW 14.9 06/06/2019 0900   LYMPHSABS 1.7 06/06/2019 0900   MONOABS 0.3 06/06/2019 0900   EOSABS 0.1 06/06/2019 0900   BASOSABS 0.0 06/06/2019 0900   CMP Latest Ref Rng & Units 06/06/2019 05/09/2019 04/28/2019  Glucose 70 - 99 mg/dL 100(H) 87 87  BUN 8 - 23 mg/dL 14 25(H) 23  Creatinine 0.61 - 1.24 mg/dL 1.68(H) 1.81(H) 1.93(H)  Sodium 135 - 145 mmol/L 139 139 139  Potassium 3.5 - 5.1 mmol/L 4.1 3.9 3.9  Chloride 98 - 111 mmol/L 104 101 102  CO2 22 - 32 mmol/L 24 25 26   Calcium 8.9 - 10.3 mg/dL 9.0 9.8 10.0  Total Protein 6.5 - 8.1 g/dL 6.8 7.4 7.1  Total Bilirubin 0.3 - 1.2 mg/dL 0.4 0.6 0.7  Alkaline Phos 38 - 126 U/L 62 62 61  AST 15 - 41 U/L 24 23 22   ALT 0 - 44 U/L 16 15 16        ASSESSMENT & PLAN:   Prostate cancer metastatic to bone ([place]) 1.  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 Noland Fordyce office every 6 months.  He is not able to recall when his last shot was.  -Last bone scan on 02/07/2018 shows scattered degenerative type uptake at T8 vertebral body corresponding to chronic T8 compression fracture.  Questionable abnormal uptake versus  artifact at the right humeral diaphysis.  Subsequent plain x-ray of the humerus was negative. -CT abdomen pelvis on 03/24/2018 shows mild left hydronephrosis secondary to enlarging left internal iliac adenopathy.  Additional retroperitoneal and perirectal lymph nodes grossly stable in size. - Enzalutamide 160 mg daily started approximately towards the end of November 2019. -PSA on 11/02/2018 was 4.75, down to 3.47 on 12/01/2018.   - His PSA continues to trend down now at 9.37.  Have recommended we proceed with bone scan as well as CT chest abdomen pelvis to assess for disease progression.  - Patient may be a candidate for tumor testing for MSI.  Would also recommend  germline testing. -If disease progression is confirmed patient would likely benefit from docetaxel. -He will return to clinic in 1 month.  2.  Bone metastasis: -Denosumab was started on 03/05/2017.  He is tolerating it very well. -I have suggested him to take calcium twice daily.  Today he may proceed with denosumab.  3.  CKD: -Creatinine has been stable between 1.7-1.8.  4.  Hard of hearing -Have referred patient to audiology for evaluation.      Orders placed this encounter:  Orders Placed This Encounter  Procedures  . NM Bone Scan Whole Body  . CT Chest W Contrast  . CT Abdomen Pelvis W Contrast  . CBC with Differential  . Comprehensive metabolic panel  . [place], [place] [id number]

## 2019-06-14 DIAGNOSIS — Z961 Presence of intraocular lens: Secondary | ICD-10-CM | POA: Diagnosis not present

## 2019-06-15 MED FILL — XTANDI 40 MG CAPSULE: 40 | 30 days supply | Qty: 120 | Fill #0

## 2019-06-27 ENCOUNTER — Ambulatory Visit (HOSPITAL_COMMUNITY): Admission: RE | Admit: 2019-06-27 | Payer: Medicare Other | Source: Ambulatory Visit

## 2019-06-27 ENCOUNTER — Ambulatory Visit (HOSPITAL_COMMUNITY): Payer: Medicare Other

## 2019-06-30 ENCOUNTER — Other Ambulatory Visit: Payer: Self-pay

## 2019-06-30 ENCOUNTER — Ambulatory Visit (HOSPITAL_COMMUNITY)
Admission: RE | Admit: 2019-06-30 | Discharge: 2019-06-30 | Disposition: A | Discharge status: Home / Self Care | Payer: Medicare Other | Source: Ambulatory Visit | Attending: Hematology | Admitting: Hematology

## 2019-06-30 DIAGNOSIS — C61 Malignant neoplasm of prostate: Secondary | ICD-10-CM | POA: Diagnosis present

## 2019-06-30 DIAGNOSIS — C7951 Secondary malignant neoplasm of bone: Secondary | ICD-10-CM | POA: Diagnosis not present

## 2019-06-30 MED ORDER — IOHEXOL 300 MG/ML  SOLN
75.0000 mL | Freq: Once | INTRAMUSCULAR | Status: AC | PRN
  Administered 2019-06-30: 75 mL via INTRAVENOUS

## 2019-07-05 ENCOUNTER — Inpatient Hospital Stay (HOSPITAL_COMMUNITY): Payer: Medicare Other

## 2019-07-06 ENCOUNTER — Encounter (HOSPITAL_COMMUNITY): Payer: Self-pay | Admitting: Hematology

## 2019-07-06 ENCOUNTER — Inpatient Hospital Stay (HOSPITAL_COMMUNITY): Payer: Medicare Other

## 2019-07-06 ENCOUNTER — Other Ambulatory Visit (HOSPITAL_COMMUNITY): Payer: Self-pay | Admitting: *Deleted

## 2019-07-06 ENCOUNTER — Other Ambulatory Visit: Payer: Self-pay

## 2019-07-06 ENCOUNTER — Inpatient Hospital Stay (HOSPITAL_COMMUNITY): Payer: Medicare Other | Attending: Hematology | Admitting: Hematology

## 2019-07-06 DIAGNOSIS — N189 Chronic kidney disease, unspecified: Secondary | ICD-10-CM | POA: Diagnosis not present

## 2019-07-06 DIAGNOSIS — N1330 Unspecified hydronephrosis: Secondary | ICD-10-CM | POA: Insufficient documentation

## 2019-07-06 DIAGNOSIS — C7951 Secondary malignant neoplasm of bone: Secondary | ICD-10-CM | POA: Diagnosis not present

## 2019-07-06 DIAGNOSIS — C61 Malignant neoplasm of prostate: Secondary | ICD-10-CM

## 2019-07-06 DIAGNOSIS — Z8042 Family history of malignant neoplasm of prostate: Secondary | ICD-10-CM | POA: Insufficient documentation

## 2019-07-06 DIAGNOSIS — Z803 Family history of malignant neoplasm of breast: Secondary | ICD-10-CM | POA: Insufficient documentation

## 2019-07-06 DIAGNOSIS — Z809 Family history of malignant neoplasm, unspecified: Secondary | ICD-10-CM | POA: Diagnosis not present

## 2019-07-06 DIAGNOSIS — M7918 Myalgia, other site: Secondary | ICD-10-CM | POA: Diagnosis not present

## 2019-07-06 DIAGNOSIS — Z87891 Personal history of nicotine dependence: Secondary | ICD-10-CM | POA: Insufficient documentation

## 2019-07-06 LAB — COMPREHENSIVE METABOLIC PANEL
ALT: 18 U/L (ref 0–44)
AST: 26 U/L (ref 15–41)
Albumin: 3.9 g/dL (ref 3.5–5.0)
Alkaline Phosphatase: 67 U/L (ref 38–126)
Anion gap: 10 (ref 5–15)
BUN: 14 mg/dL (ref 8–23)
CO2: 24 mmol/L (ref 22–32)
Calcium: 9.2 mg/dL (ref 8.9–10.3)
Chloride: 103 mmol/L (ref 98–111)
Creatinine, Ser: 1.76 mg/dL — ABNORMAL HIGH (ref 0.61–1.24)
GFR calc Af Amer: 42 mL/min — ABNORMAL LOW (ref >60)
GFR calc non Af Amer: 36 mL/min — ABNORMAL LOW (ref >60)
Glucose, Bld: 118 mg/dL — ABNORMAL HIGH (ref 70–99)
Potassium: 4.1 mmol/L (ref 3.5–5.1)
Sodium: 137 mmol/L (ref 135–145)
Total Bilirubin: 0.8 mg/dL (ref 0.3–1.2)
Total Protein: 6.8 g/dL (ref 6.5–8.1)

## 2019-07-06 MED ORDER — DENOSUMAB 120 MG/1.7ML ~~LOC~~ SOLN
120.0000 mg | Freq: Once | SUBCUTANEOUS | Status: AC
  Administered 2019-07-06: 120 mg via SUBCUTANEOUS
  Filled 2019-07-06: qty 1.7

## 2019-07-06 NOTE — Progress Notes (Signed)
Patient tolerated injection with no complaints voiced.  Site clean and dry with no bruising or swelling noted at site.  Band aid applied.  Vss with discharge and left ambulatory with no s/s of distress noted.  

## 2019-07-06 NOTE — Patient Instructions (Signed)
[facility] at [facility]  Discharge Instructions:  You saw Renee Nester, NP, today. _______________________________________________________________  Thank you for choosing [facility] to provide your oncology and hematology care.  To afford each patient quality time with our providers, please arrive at least 15 minutes before your scheduled appointment.  You need to re-schedule your appointment if you arrive 10 or more minutes late.  We strive to give you quality time with our providers, and arriving late affects you and other patients whose appointments are after yours.  Also, if you no show three or more times for appointments you may be dismissed from the clinic.  Again, thank you for choosing [facility] at [facility]. Our hope is that these requests will allow you access to exceptional care and in a timely manner. _______________________________________________________________  If you have questions after your visit, please contact our office at [phone] between the hours of 8:30 a.m. and 5:00 p.m. Voicemails left after 4:30 p.m. will not be returned until the following business day. _______________________________________________________________  For prescription refill requests, have your pharmacy contact our office. _______________________________________________________________  Recommendations made by the consultant and any test results will be sent to your referring physician. _______________________________________________________________ 

## 2019-07-07 ENCOUNTER — Other Ambulatory Visit: Payer: Self-pay

## 2019-07-07 ENCOUNTER — Inpatient Hospital Stay (HOSPITAL_BASED_OUTPATIENT_CLINIC_OR_DEPARTMENT_OTHER): Payer: Medicare Other | Admitting: Genetic Counselor

## 2019-07-07 ENCOUNTER — Encounter (HOSPITAL_COMMUNITY): Payer: Self-pay | Admitting: Lab

## 2019-07-07 ENCOUNTER — Encounter (HOSPITAL_COMMUNITY): Payer: Self-pay | Admitting: Genetic Counselor

## 2019-07-07 ENCOUNTER — Inpatient Hospital Stay (HOSPITAL_COMMUNITY): Payer: Medicare Other

## 2019-07-07 DIAGNOSIS — Z803 Family history of malignant neoplasm of breast: Secondary | ICD-10-CM | POA: Insufficient documentation

## 2019-07-07 DIAGNOSIS — C61 Malignant neoplasm of prostate: Secondary | ICD-10-CM | POA: Diagnosis not present

## 2019-07-07 DIAGNOSIS — C7951 Secondary malignant neoplasm of bone: Secondary | ICD-10-CM

## 2019-07-07 DIAGNOSIS — Z8042 Family history of malignant neoplasm of prostate: Secondary | ICD-10-CM | POA: Diagnosis not present

## 2019-07-07 NOTE — Progress Notes (Signed)
[place], [place] [address]   CLINIC:  Medical Oncology/Hematology  PCP:  Celene Squibb, MD [place] [address] [phone]   REASON FOR VISIT:  Follow-up for Prostate Cancer   CURRENT THERAPY: Enzalutamide    INTERVAL HISTORY:  [name] 78 y.o. male presents today for follow up. Reports over all doing well. Denies any significant fatigue.  Denies any new bone pain.  Denies any chest pain, shortness of breath, lightheadedness or dizziness.  Denies any change in bowel habits.  Appetite is stable.  No weight loss.  Denies any recent hospitalizations or infections. He  Is here for repeat labs and office visit.     REVIEW OF SYSTEMS:  Review of Systems  Constitutional: Positive for fatigue.  HENT:  Negative.   Eyes: Negative.   Respiratory: Negative.   Cardiovascular: Negative.   Gastrointestinal: Negative.   Endocrine: Negative.   Genitourinary: Negative.    Musculoskeletal: Positive for arthralgias and myalgias.  Skin: Negative.   Neurological: Negative.   Hematological: Negative.   Psychiatric/Behavioral: Negative.      PAST MEDICAL/SURGICAL HISTORY:  Past Medical History:  Diagnosis Date  . Cancer [facility])    prostate  . GERD (gastroesophageal reflux disease)   . Hyperlipidemia   . Hypertension   . 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 Sleep apnea 10/24/2016   Past Surgical History:  Procedure Laterality Date  . CATARACT EXTRACTION W/PHACO Right 04/22/2019   Procedure: CATARACT EXTRACTION PHACO AND INTRAOCULAR LENS PLACEMENT RIGHT EYE;  Surgeon: Baruch Goldmann, MD;  Location: [facility];  Service: Ophthalmology;  Laterality: Right;  right  . CATARACT EXTRACTION W/PHACO Left 05/13/2019   Procedure: CATARACT EXTRACTION PHACO AND INTRAOCULAR LENS PLACEMENT LEFT EYE;  Surgeon: Baruch Goldmann, MD;  Location: [facility];  Service: Ophthalmology;  Laterality: Left;  left  . CHOLECYSTECTOMY    . HAND  SURGERY    . INGUINAL HERNIA REPAIR Left   . PROSTATECTOMY    . PROSTATECTOMY    . radical prostatectomy    . TONSILLECTOMY       SOCIAL HISTORY:  Social History   Socioeconomic History  . Marital status: Widowed    Spouse name: Not on file  . Number of children: 3  . Years of education: 9  . Highest education level: Not on file  Occupational History  . Occupation: Animator- retired    Comment: central [employer]  Social Needs  . Financial resource strain: Not on file  . Food insecurity    Worry: Not on file    Inability: Not on file  . Transportation needs    Medical: Not on file    Non-medical: Not on file  Tobacco Use  . Smoking status: Former Smoker    Packs/day: 1.00    Years: 32.00    Pack years: 32.00    Types: Cigarettes    Quit date: 02/04/1989    Years since quitting: 30.4  . Smokeless tobacco: Never Used  Substance and Sexual Activity  . Alcohol use: No  . Drug use: No  . Sexual activity: Not Currently  Lifestyle  . Physical activity    Days per week: Not on file    Minutes per session: Not on file  . Stress: Not on file  Relationships  . Social Herbalist on phone: Not on file    Gets together: Not on file    Attends religious service:  Not on file    Active member of club or organization: Not on file    Attends meetings of clubs or organizations: Not on file    Relationship status: Not on file  . Intimate partner violence    Fear of current or ex partner: Not on file    Emotionally abused: Not on file    Physically abused: Not on file    Forced sexual activity: Not on file  Other Topics Concern  . Not on file  Social History Narrative   Widowed   [place] with son   Chronic low back pain    FAMILY HISTORY:  Family History  Problem Relation Age of Onset  . Heart failure Mother        CHF  . Prostate cancer Father   . Narcolepsy Sister   . Cancer Daughter     CURRENT MEDICATIONS:  Outpatient Encounter Medications as of  07/06/2019  Medication Sig Note  . benazepril (LOTENSIN) 20 MG tablet TAKE ONE TABLET BY MOUTH DAILY.   . calcium-vitamin D (OSCAL WITH D) 500-200 MG-UNIT TABS tablet Take 1 tablet by mouth daily.   . cetirizine (ZYRTEC) 10 MG tablet TAKE ONE TABLET BY MOUTH DAILY.   . clotrimazole-betamethasone (LOTRISONE) cream Apply 1 application topically 2 (two) times daily.   . Ferrous Sulfate (IRON) 325 (65 Fe) MG TABS Take 1 tablet by mouth daily.   . fexofenadine (ALLEGRA) 180 MG tablet Take 180 mg by mouth daily. 11/02/2018: Not sure which allergy medication he is currently taking   . fluticasone (FLONASE) 50 MCG/ACT nasal spray USE ONE SPRAY IN EACH NOSTRIL ONCE DAILY. SHAKE GENTLY BEFORE USING.   . furosemide (LASIX) 40 MG tablet TAKE ONE TABLET BY MOUTH DAILY. TAKE WITH POTASSIUM.   Marland Kitchen hydrochlorothiazide (HYDRODIURIL) 25 MG tablet TAKE ONE TABLET BY MOUTH DAILY.   Marland Kitchen ILEVRO 0.3 % ophthalmic suspension INSTILL ONE DROP IN THE OPERATIVE EYE DAILY. START TWO DAYS BEFORE SURGERY AND CONTINUE AS DIRECTED.   Marland Kitchen ketoconazole (NIZORAL) 2 % cream APPLY A SMALL AMOUNT TO FACE RASH TWICE DAILY FOR UP TO FOUR WEEKS IF NEEDED.   Marland Kitchen losartan (COZAAR) 100 MG tablet Take 100 mg by mouth daily.   . meloxicam (MOBIC) 7.5 MG tablet Take 1 tablet (7.5 mg total) daily by mouth.   . methylphenidate (RITALIN) 20 MG tablet Take 20 mg by mouth daily.   . mirabegron ER (MYRBETRIQ) 25 MG TB24 tablet Take by mouth.   . moxifloxacin (VIGAMOX) 0.5 % ophthalmic solution INSTILL ONE DROP THREE TIMES DAILY IN THE OPERATIVE EYE. START TWO DAYS BEFORE SURGERY AND CONTINUE AS DIRECTED.   Marland Kitchen potassium chloride SA (K-DUR,KLOR-CON) 20 MEQ tablet Take 20 mEq by mouth daily.   . prednisoLONE acetate (PRED FORTE) 1 % ophthalmic suspension INSTILL ONE DROP IN THE OPERATIVE EYE THREE TIMES DAILY. START ONE DAY AFTER SURGERY AND CONTINUE AS DIRECTED.   Marland Kitchen selenium sulfide (SELSUN) 2.5 % shampoo Apply 1 application topically daily as needed for  irritation.   . traMADol (ULTRAM) 50 MG tablet TAKE ONE TABLET BY MOUTH EVERY 6 HOURS AS NEEDED FOR PAIN   . XTANDI 40 MG capsule TAKE 4 CAPSULES (160 MG TOTAL) BY MOUTH DAILY.    No facility-administered encounter medications on file as of 07/06/2019.     ALLERGIES:  No Known Allergies   PHYSICAL EXAM:  ECOG Performance status: 1  Vitals:   07/06/19 0927  BP: 140/78  Pulse: 73  Resp: 18  Temp: 97.9  F (36.6 C)  SpO2: 100%   Filed Weights   07/06/19 0927  Weight: 275 lb 11.2 oz (125.1 kg)    Physical Exam Constitutional:      Appearance: Normal appearance. He is obese.  HENT:     Head: Normocephalic.     Right Ear: External ear normal.     Left Ear: External ear normal.     Nose: Nose normal.     Mouth/Throat:     Pharynx: Oropharynx is clear.  Eyes:     Extraocular Movements: Extraocular movements intact.     Conjunctiva/sclera: Conjunctivae normal.  Neck:     Musculoskeletal: Normal range of motion.  Cardiovascular:     Rate and Rhythm: Normal rate and regular rhythm.     Pulses: Normal pulses.     Heart sounds: Normal heart sounds.  Pulmonary:     Effort: Pulmonary effort is normal.     Breath sounds: Normal breath sounds.  Abdominal:     General: Bowel sounds are normal.  Musculoskeletal: Normal range of motion.  Neurological:     General: No focal deficit present.     Mental Status: He is alert and oriented to person, place, and time.  Psychiatric:        Mood and Affect: Mood normal.        Behavior: Behavior normal.        Thought Content: Thought content normal.        Judgment: Judgment normal.      LABORATORY DATA:  I have reviewed the labs as listed.  CBC    Component Value Date/Time   WBC 5.4 06/06/2019 0900   RBC 4.06 (L) 06/06/2019 0900   HGB 11.5 (L) 06/06/2019 0900   HCT 35.6 (L) 06/06/2019 0900   PLT 179 06/06/2019 0900   MCV 87.7 06/06/2019 0900   MCH 28.3 06/06/2019 0900   MCHC 32.3 06/06/2019 0900   RDW 14.9 06/06/2019  0900   LYMPHSABS 1.7 06/06/2019 0900   MONOABS 0.3 06/06/2019 0900   EOSABS 0.1 06/06/2019 0900   BASOSABS 0.0 06/06/2019 0900   CMP Latest Ref Rng & Units 07/06/2019 06/06/2019 05/09/2019  Glucose 70 - 99 mg/dL 118(H) 100(H) 87  BUN 8 - 23 mg/dL 14 14 25(H)  Creatinine 0.61 - 1.24 mg/dL 1.76(H) 1.68(H) 1.81(H)  Sodium 135 - 145 mmol/L 137 139 139  Potassium 3.5 - 5.1 mmol/L 4.1 4.1 3.9  Chloride 98 - 111 mmol/L 103 104 101  CO2 22 - 32 mmol/L 24 24 25   Calcium 8.9 - 10.3 mg/dL 9.2 9.0 9.8  Total Protein 6.5 - 8.1 g/dL 6.8 6.8 7.4  Total Bilirubin 0.3 - 1.2 mg/dL 0.8 0.4 0.6  Alkaline Phos 38 - 126 U/L 67 62 62  AST 15 - 41 U/L 26 24 23   ALT 0 - 44 U/L 18 16 15          ASSESSMENT & PLAN:   Prostate cancer metastatic to bone ([place]) 1.  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 Noland Fordyce office every 6 months.  He is not able to recall when his last shot was.  -Last bone scan on 02/07/2018 shows scattered degenerative type uptake at T8 vertebral body corresponding to chronic T8 compression fracture.  Questionable abnormal uptake versus artifact at the right humeral diaphysis.  Subsequent plain x-ray of the humerus was negative. -CT abdomen pelvis on 03/24/2018 shows mild left hydronephrosis secondary to enlarging left internal iliac adenopathy.  Additional retroperitoneal and perirectal lymph nodes grossly stable in size. - Enzalutamide 160 mg daily started approximately towards the end of November 2019. -PSA on 11/02/2018 was 4.75, down to 3.47 on 12/01/2018.   - His PSA continues to trend up now at 9.37.  Have recommended we proceed with bone scan as well as CT chest abdomen pelvis to assess for disease progression.  -CT of chest bone and pelvis on June 30, 2019 did not reveal any progressive disease.  Of note, there was progression of left iliac lymphadenopathy which traps the left ureter.  He does not have any difficulty  with urination.  However, would recommend patient follow-up with a urologist. -We will plan to repeat PSA level at his next visit. - Patient may be a candidate for tumor testing for MSI.  -If disease progression is confirmed patient would likely benefit from docetaxel. -He will return to clinic in 1 month.  2.  Bone metastasis: -Denosumab was started on 03/05/2017.  He is tolerating it very well. -Continue Calcium and Vd supplements.   3.  CKD: -Creatinine has been stable between 1.7-1.8.  4.  Hard of hearing -Have referred patient to audiology for evaluation.      Orders placed this encounter:  Orders Placed This Encounter  Procedures  . CBC with Differential  . Comprehensive metabolic panel  . [place], [place] [phone]

## 2019-07-07 NOTE — Assessment & Plan Note (Addendum)
1.  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 Noland Fordyce office every 6 months.  He is not able to recall when his last shot was.  -Last bone scan on 02/07/2018 shows scattered degenerative type uptake at T8 vertebral body corresponding to chronic T8 compression fracture.  Questionable abnormal uptake versus artifact at the right humeral diaphysis.  Subsequent plain x-ray of the humerus was negative. -CT abdomen pelvis on 03/24/2018 shows mild left hydronephrosis secondary to enlarging left internal iliac adenopathy.  Additional retroperitoneal and perirectal lymph nodes grossly stable in size. - Enzalutamide 160 mg daily started approximately towards the end of November 2019. -PSA on 11/02/2018 was 4.75, down to 3.47 on 12/01/2018.   - His PSA continues to trend up now at 9.37.  Have recommended we proceed with bone scan as well as CT chest abdomen pelvis to assess for disease progression.  -CT of chest bone and pelvis on June 30, 2019 did not reveal any progressive disease.  Of note, there was progression of left iliac lymphadenopathy which traps the left ureter.  He does not have any difficulty with urination.  However, would recommend patient follow-up with a urologist. -We will plan to repeat PSA level at his next visit. - Patient may be a candidate for tumor testing for MSI.  -If disease progression is confirmed patient would likely benefit from docetaxel. -He will return to clinic in 1 month.  2.  Bone metastasis: -Denosumab was started on 03/05/2017.  He is tolerating it very well. -Continue Calcium and Vd supplements.   3.  CKD: -Creatinine has been stable between 1.7-1.8.  4.  Hard of hearing -Have referred patient to audiology for evaluation.

## 2019-07-07 NOTE — Progress Notes (Signed)
REFERRING PROVIDER: Derek Jack, MD [address] [place],  [place] [address]  PRIMARY PROVIDER:  Celene Squibb, MD  PRIMARY REASON FOR VISIT:  1. Prostate cancer metastatic to bone ([place])   2. Family history of prostate cancer   3. Family history of breast cancer      HISTORY OF PRESENT ILLNESS:   I connected with [name] on 07/07/2019 at 1 PM EDT by Webex video conference and verified that I am speaking with the correct person using two identifiers.   Patient location: [place] Provider location: [facility]  [name], a 78 y.o. male, was seen for a [place] cancer genetics consultation at the request of Dr. Delton Coombes due to a personal and family history of cancer.  [name] presents to clinic today to discuss the possibility of a hereditary predisposition to cancer, genetic testing, and to further clarify his future cancer risks, as well as potential cancer risks for family members.   In 2015, at the age of 39, [name] was diagnosed with prostate cancer. During this discussion, [name] struggled to hear the audio due to being hard of hearing.  He may need to have additional discussion of his test results when they come back by his provider who can sit down and review them in person.     CANCER HISTORY:  Oncology History   No history exists.     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   Sleep apnea 10/24/2016    Past Surgical History:  Procedure Laterality Date   CATARACT EXTRACTION W/PHACO Right 04/22/2019   Procedure: CATARACT EXTRACTION PHACO AND INTRAOCULAR LENS PLACEMENT RIGHT EYE;  Surgeon: Baruch Goldmann, MD;  Location: [facility];  Service: Ophthalmology;  Laterality: Right;  right   CATARACT EXTRACTION W/PHACO  Left 05/13/2019   Procedure: CATARACT EXTRACTION PHACO AND INTRAOCULAR LENS PLACEMENT LEFT EYE;  Surgeon: Baruch Goldmann, MD;  Location: [facility];  Service: Ophthalmology;  Laterality: Left;  left   CHOLECYSTECTOMY     HAND SURGERY     INGUINAL HERNIA REPAIR Left    PROSTATECTOMY     PROSTATECTOMY     radical prostatectomy     TONSILLECTOMY      Social History   Socioeconomic History   Marital status: Widowed    Spouse name: Not on file   Number of children: 3   Years of education: 9   Highest education level: Not on file  Occupational History   Occupation: Animator- retired    Comment: central telephone  Social Designer, fashion/clothing strain: Not on file   Food insecurity    Worry: Not on file    Inability: Not on file   Transportation needs    Medical: Not on file    Non-medical: Not on file  Tobacco Use   Smoking status: Former Smoker    Packs/day: 1.00    Years: 32.00    Pack years: 32.00    Types: Cigarettes    Quit date: 02/04/1989    Years since quitting: 30.4   Smokeless tobacco: Never Used  Substance and Sexual Activity   Alcohol use: No   Drug use: No   Sexual activity: Not Currently  Lifestyle  Physical activity    Days per week: Not on file    Minutes per session: Not on file   Stress: Not on file  Relationships   Social connections    Talks on phone: Not on file    Gets together: Not on file    Attends religious service: Not on file    Active member of club or organization: Not on file    Attends meetings of clubs or organizations: Not on file    Relationship status: Not on file  Other Topics Concern   Not on file  Social History Narrative   Widowed   Loves with son   Chronic low back pain     FAMILY HISTORY:  We obtained a detailed, 4-generation family history.  Significant diagnoses are listed below: Family History  Problem Relation Age of Onset   Heart failure Mother        CHF   Cancer Mother         unknown   Prostate cancer Father    Narcolepsy Sister    Cancer Daughter        unknown   Breast cancer Maternal Aunt    Cancer Maternal Grandmother        unknown   Cancer Maternal Aunt        unknown type of cancer   The patient has two daughters and a son.  One daughter died of cancer (he is unsure of the type) at 80.  He has three sisters who are cancer free. Both parents are deceased.  The patient's father died of prostate cancer at 7.  He had a brother and three sisters.  The patient does not know if anyone had cancer.  There is no other reported family history of cancer on the paternal side.  The patient's mother died of an unknown cancer.  She had five sisters and a brother.  One sister had breast cancer, and three other sisters had unknown cancers.  The paternal grandparents are deceased.  The grandmother had an unknown cancer.  [name] is unaware of previous family history of genetic testing for hereditary cancer risks. Patient's maternal ancestors are of African American descent, and paternal ancestors are of African American descent. There is no reported Ashkenazi Jewish ancestry. There is no known consanguinity.    GENETIC COUNSELING ASSESSMENT: [name] is a 78 y.o. male with a personal and family history of cancer which is somewhat suggestive of a hereditary cancer syndrome and predisposition to cancer given his metastatic prostate cancer. We, therefore, discussed and recommended the following at today's visit.   DISCUSSION: We discussed that 10 - 15% of prostate cancer is hereditary, with most cases associated with BRCA mutations.  There are other genes that can be associated with hereditary prostate cancer syndromes.  We discussed that testing is beneficial for several reasons including knowing how to follow individuals after completing their treatment, identifying whether potential treatment options such as PARP inhibitors would be beneficial, and understand if  other family members could be at risk for cancer and allow them to undergo genetic testing. The patient struggled to hear the conversation, so this may need to be revisited with him in person after the results come back.  We reviewed the characteristics, features and inheritance patterns of hereditary cancer syndromes. We also discussed genetic testing, including the appropriate family members to test, the process of testing, insurance coverage and turn-around-time for results. We discussed the implications of a negative, positive, carrier and/or  variant of uncertain significant result. We recommended [name] pursue genetic testing for the common hereditary cancer gene panel. The Common Hereditary Gene Panel offered by [facility] includes sequencing and/or deletion duplication testing of the following 48 genes: APC, ATM, AXIN2, BARD1, BMPR1A, BRCA1, BRCA2, BRIP1, CDH1, CDK4, CDKN2A (p14ARF), CDKN2A (p16INK4a), CHEK2, CTNNA1, DICER1, EPCAM (Deletion/duplication testing only), GREM1 (promoter region deletion/duplication testing only), KIT, MEN1, MLH1, MSH2, MSH3, MSH6, MUTYH, NBN, NF1, NHTL1, PALB2, PDGFRA, PMS2, POLD1, POLE, PTEN, RAD50, RAD51C, RAD51D, RNF43, SDHB, SDHC, SDHD, SMAD4, SMARCA4. STK11, TP53, TSC1, TSC2, and VHL.  The following genes were evaluated for sequence changes only: SDHA and HOXB13 c.251G>A variant only.   Based on [name]'s personal and family history of cancer, he meets medical criteria for genetic testing. Despite that he meets criteria, he may still have an out of pocket cost. We discussed that if his out of pocket cost for testing is over $100, the laboratory will call and confirm whether he wants to proceed with testing.  If the out of pocket cost of testing is less than $100 he will be billed by the genetic testing laboratory.   PLAN: After considering the risks, benefits, and limitations, [name] provided informed consent to pursue genetic testing and the blood sample was  sent to [facility] for analysis of the common hereditary cancer panel. Results should be available within approximately 2-3 weeks' time, at which point they will be disclosed by telephone to [name], as will any additional recommendations warranted by these results. [name] will receive a summary of his genetic counseling visit and a copy of his results once available. This information will also be available in Epic.   Lastly, we encouraged [name] to remain in contact with cancer genetics annually so that we can continuously update the family history and inform him of any changes in cancer genetics and testing that may be of benefit for this family.   [name] to his satisfaction today. Our contact information was provided should additional questions or concerns arise. Thank you for the referral and allowing [place] to share in the care of your patient.   Akhilesh Sassone P. [place], [place], [facility] Licensed, Insurance risk surveyor [email or web address] phone: [phone]  The patient was seen for a total of 45 minutes in face-to-face genetic counseling.  This patient was discussed with Drs. Magrinat, Lindi Adie and/or Burr Medico who agrees with the above.    _______________________________________________________________________ For Office Staff:  Number of people involved in session: 1 Was an Intern/ student involved with case: no

## 2019-07-07 NOTE — Progress Notes (Unsigned)
Referral sent to [facility]. Records faxed on 9/10

## 2019-07-12 ENCOUNTER — Other Ambulatory Visit (HOSPITAL_COMMUNITY): Payer: Self-pay | Admitting: Hematology

## 2019-07-12 DIAGNOSIS — C7951 Secondary malignant neoplasm of bone: Secondary | ICD-10-CM

## 2019-07-12 DIAGNOSIS — C61 Malignant neoplasm of prostate: Secondary | ICD-10-CM

## 2019-07-14 ENCOUNTER — Encounter: Payer: Self-pay | Admitting: Genetic Counselor

## 2019-07-14 DIAGNOSIS — Z1379 Encounter for other screening for genetic and chromosomal anomalies: Secondary | ICD-10-CM | POA: Insufficient documentation

## 2019-07-14 MED FILL — XTANDI 40 MG CAPSULE: 40 | 30 days supply | Qty: 120 | Fill #0

## 2019-07-20 ENCOUNTER — Other Ambulatory Visit: Payer: Self-pay

## 2019-07-20 ENCOUNTER — Inpatient Hospital Stay (HOSPITAL_BASED_OUTPATIENT_CLINIC_OR_DEPARTMENT_OTHER): Payer: Medicare Other | Admitting: Hematology

## 2019-07-20 ENCOUNTER — Encounter (HOSPITAL_COMMUNITY): Payer: Self-pay | Admitting: Hematology

## 2019-07-20 VITALS — BP 148/64 | HR 80 | Temp 97.9°F | Resp 20 | Wt 277.0 lb

## 2019-07-20 DIAGNOSIS — C7951 Secondary malignant neoplasm of bone: Secondary | ICD-10-CM

## 2019-07-20 DIAGNOSIS — Z0000 Encounter for general adult medical examination without abnormal findings: Secondary | ICD-10-CM | POA: Diagnosis not present

## 2019-07-20 DIAGNOSIS — N1330 Unspecified hydronephrosis: Secondary | ICD-10-CM | POA: Diagnosis not present

## 2019-07-20 DIAGNOSIS — C61 Malignant neoplasm of prostate: Secondary | ICD-10-CM

## 2019-07-20 DIAGNOSIS — M7918 Myalgia, other site: Secondary | ICD-10-CM | POA: Diagnosis not present

## 2019-07-20 DIAGNOSIS — Z87891 Personal history of nicotine dependence: Secondary | ICD-10-CM | POA: Diagnosis not present

## 2019-07-20 DIAGNOSIS — Z803 Family history of malignant neoplasm of breast: Secondary | ICD-10-CM | POA: Diagnosis not present

## 2019-07-20 DIAGNOSIS — N189 Chronic kidney disease, unspecified: Secondary | ICD-10-CM | POA: Diagnosis not present

## 2019-07-20 DIAGNOSIS — Z809 Family history of malignant neoplasm, unspecified: Secondary | ICD-10-CM | POA: Diagnosis not present

## 2019-07-20 NOTE — Progress Notes (Signed)
[facility] [address] [place] [address]   CLINIC:  Medical Oncology/Hematology  PCP:  Celene Squibb, MD [address] [address] [phone]   REASON FOR VISIT:  Follow-up for metastatic prostate cancer    INTERVAL HISTORY:  [name] 78 y.o. male seen for follow-up of metastatic CRPC.  He is taking enzalutamide 4 tablets daily.  He is accompanied by his wife today.  Denies any nausea, vomiting, diarrhea or constipation.  However he reports aches and pains in the muscles of the extremities.  Denies any falls.  Denies any new onset pains.  Appetite and energy levels are reasonably good.  No fevers or night sweats reported.  REVIEW OF SYSTEMS:  Review of Systems  Musculoskeletal: Positive for myalgias.  All other systems reviewed and are negative.    PAST MEDICAL/SURGICAL HISTORY:  Past Medical History:  Diagnosis Date  . Cancer [facility])    prostate  . Family history of breast cancer   . Family history of prostate cancer   . GERD (gastroesophageal reflux disease)   . Hyperlipidemia   . Hypertension   . 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 Sleep apnea 10/24/2016   Past Surgical History:  Procedure Laterality Date  . CATARACT EXTRACTION W/PHACO Right 04/22/2019   Procedure: CATARACT EXTRACTION PHACO AND INTRAOCULAR LENS PLACEMENT RIGHT EYE;  Surgeon: Baruch Goldmann, MD;  Location: [facility];  Service: Ophthalmology;  Laterality: Right;  right  . CATARACT EXTRACTION W/PHACO Left 05/13/2019   Procedure: CATARACT EXTRACTION PHACO AND INTRAOCULAR LENS PLACEMENT LEFT EYE;  Surgeon: Baruch Goldmann, MD;  Location: [facility];  Service: Ophthalmology;  Laterality: Left;  left  . CHOLECYSTECTOMY    . HAND SURGERY    . INGUINAL HERNIA REPAIR Left   . PROSTATECTOMY    . PROSTATECTOMY    . radical prostatectomy    . TONSILLECTOMY       SOCIAL HISTORY:  Social History   Socioeconomic History  . Marital status: Widowed   Spouse name: Not on file  . Number of children: 3  . Years of education: 9  . Highest education level: Not on file  Occupational History  . Occupation: Animator- retired    Comment: [employer]  Social Needs  . Financial resource strain: Not on file  . Food insecurity    Worry: Not on file    Inability: Not on file  . Transportation needs    Medical: Not on file    Non-medical: Not on file  Tobacco Use  . Smoking status: Former Smoker    Packs/day: 1.00    Years: 32.00    Pack years: 32.00    Types: Cigarettes    Quit date: 02/04/1989    Years since quitting: 30.4  . Smokeless tobacco: Never Used  Substance and Sexual Activity  . Alcohol use: No  . Drug use: No  . Sexual activity: Not Currently  Lifestyle  . Physical activity    Days per week: Not on file    Minutes per session: Not on file  . Stress: Not on file  Relationships  . Social Herbalist on phone: Not on file    Gets together: Not on file    Attends religious service: Not on file    Active member of club or organization: Not on file    Attends meetings of clubs or organizations: Not on file    Relationship status: Not on  file  . Intimate partner violence    Fear of current or ex partner: Not on file    Emotionally abused: Not on file    Physically abused: Not on file    Forced sexual activity: Not on file  Other Topics Concern  . Not on file  Social History Narrative   Widowed   [place] with son   Chronic low back pain    FAMILY HISTORY:  Family History  Problem Relation Age of Onset  . Heart failure Mother        CHF  . Cancer Mother        unknown  . Prostate cancer Father   . Narcolepsy Sister   . Cancer Daughter        unknown  . Breast cancer Maternal Aunt   . Cancer Maternal Grandmother        unknown  . Cancer Maternal Aunt        unknown type of cancer    CURRENT MEDICATIONS:  Outpatient Encounter Medications as of 07/20/2019  Medication Sig Note  . benazepril  (LOTENSIN) 20 MG tablet TAKE ONE TABLET BY MOUTH DAILY.   . calcium-vitamin D (OSCAL WITH D) 500-200 MG-UNIT TABS tablet Take 1 tablet by mouth daily.   . cetirizine (ZYRTEC) 10 MG tablet TAKE ONE TABLET BY MOUTH DAILY.   Marland Kitchen Ferrous Sulfate (IRON) 325 (65 Fe) MG TABS Take 1 tablet by mouth daily.   . furosemide (LASIX) 40 MG tablet TAKE ONE TABLET BY MOUTH DAILY. TAKE WITH POTASSIUM.   Marland Kitchen hydrochlorothiazide (HYDRODIURIL) 25 MG tablet TAKE ONE TABLET BY MOUTH DAILY.   Marland Kitchen ILEVRO 0.3 % ophthalmic suspension INSTILL ONE DROP IN THE OPERATIVE EYE DAILY. START TWO DAYS BEFORE SURGERY AND CONTINUE AS DIRECTED.   Marland Kitchen losartan (COZAAR) 100 MG tablet Take 100 mg by mouth daily.   . meloxicam (MOBIC) 7.5 MG tablet Take 1 tablet (7.5 mg total) daily by mouth.   . methylphenidate (RITALIN) 20 MG tablet Take 20 mg by mouth daily.   . mirabegron ER (MYRBETRIQ) 25 MG TB24 tablet Take by mouth.   . moxifloxacin (VIGAMOX) 0.5 % ophthalmic solution INSTILL ONE DROP THREE TIMES DAILY IN THE OPERATIVE EYE. START TWO DAYS BEFORE SURGERY AND CONTINUE AS DIRECTED.   Marland Kitchen potassium chloride SA (K-DUR,KLOR-CON) 20 MEQ tablet Take 20 mEq by mouth daily.   . prednisoLONE acetate (PRED FORTE) 1 % ophthalmic suspension INSTILL ONE DROP IN THE OPERATIVE EYE THREE TIMES DAILY. START ONE DAY AFTER SURGERY AND CONTINUE AS DIRECTED.   Marland Kitchen traMADol (ULTRAM) 50 MG tablet TAKE ONE TABLET BY MOUTH EVERY 6 HOURS AS NEEDED FOR PAIN   . XTANDI 40 MG capsule TAKE 4 CAPSULES (160 MG TOTAL) BY MOUTH DAILY.   . clotrimazole-betamethasone (LOTRISONE) cream Apply 1 application topically 2 (two) times daily. (Patient not taking: Reported on 07/20/2019)   . fexofenadine (ALLEGRA) 180 MG tablet Take 180 mg by mouth daily. 11/02/2018: Not sure which allergy medication he is currently taking   . fluticasone (FLONASE) 50 MCG/ACT nasal spray USE ONE SPRAY IN EACH NOSTRIL ONCE DAILY. SHAKE GENTLY BEFORE USING.   Marland Kitchen ketoconazole (NIZORAL) 2 % cream APPLY A SMALL  AMOUNT TO FACE RASH TWICE DAILY FOR UP TO FOUR WEEKS IF NEEDED.   Marland Kitchen selenium sulfide (SELSUN) 2.5 % shampoo Apply 1 application topically daily as needed for irritation. (Patient not taking: Reported on 07/20/2019)    No facility-administered encounter medications on file as of 07/20/2019.  ALLERGIES:  No Known Allergies   PHYSICAL EXAM:  ECOG Performance status: 1  Vitals:   07/20/19 1046  BP: (!) 148/64  Pulse: 80  Resp: 20  Temp: 97.9 F (36.6 C)  SpO2: 97%   Filed Weights   07/20/19 1046  Weight: 277 lb (125.6 kg)    Physical Exam Vitals signs reviewed.  Constitutional:      Appearance: Normal appearance.  Cardiovascular:     Rate and Rhythm: Normal rate and regular rhythm.     Heart sounds: Normal heart sounds.  Pulmonary:     Effort: Pulmonary effort is normal.     Breath sounds: Normal breath sounds.  Abdominal:     General: There is no distension.     Palpations: Abdomen is soft. There is no mass.  Musculoskeletal:        General: Swelling present.  Skin:    General: Skin is warm.  Neurological:     General: No focal deficit present.     Mental Status: He is alert and oriented to person, place, and time.  Psychiatric:        Mood and Affect: Mood normal.        Behavior: Behavior normal.      LABORATORY DATA:  I have reviewed the labs as listed.  CBC    Component Value Date/Time   WBC 5.4 06/06/2019 0900   RBC 4.06 (L) 06/06/2019 0900   HGB 11.5 (L) 06/06/2019 0900   HCT 35.6 (L) 06/06/2019 0900   PLT 179 06/06/2019 0900   MCV 87.7 06/06/2019 0900   MCH 28.3 06/06/2019 0900   MCHC 32.3 06/06/2019 0900   RDW 14.9 06/06/2019 0900   LYMPHSABS 1.7 06/06/2019 0900   MONOABS 0.3 06/06/2019 0900   EOSABS 0.1 06/06/2019 0900   BASOSABS 0.0 06/06/2019 0900   CMP Latest Ref Rng & Units 07/06/2019 06/06/2019 05/09/2019  Glucose 70 - 99 mg/dL 118(H) 100(H) 87  BUN 8 - 23 mg/dL 14 14 25(H)  Creatinine 0.61 - 1.24 mg/dL 1.76(H) 1.68(H) 1.81(H)   Sodium 135 - 145 mmol/L 137 139 139  Potassium 3.5 - 5.1 mmol/L 4.1 4.1 3.9  Chloride 98 - 111 mmol/L 103 104 101  CO2 22 - 32 mmol/L 24 24 25   Calcium 8.9 - 10.3 mg/dL 9.2 9.0 9.8  Total Protein 6.5 - 8.1 g/dL 6.8 6.8 7.4  Total Bilirubin 0.3 - 1.2 mg/dL 0.8 0.4 0.6  Alkaline Phos 38 - 126 U/L 67 62 62  AST 15 - 41 U/L 26 24 23   ALT 0 - 44 U/L 18 16 15        DIAGNOSTIC IMAGING:  I have independently reviewed the scans and discussed with the patient.   I have reviewed Venita Lick LPN's note and agree with the documentation.  I personally performed a face-to-face visit, made revisions and my assessment and plan is as follows.    ASSESSMENT & PLAN:   Prostate cancer metastatic to bone ([place]) 1.  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 -Enzalutamide 160 mg daily started towards the end of November 2019. - PSA has been steadily increasing, last level at 9.37 in first week of September. 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  No evidence of skeletal metastasis.  Chronic compression fracture of T8. - I have told him to discontinue enzalutamide as it is not helping.  It is causing musculoskeletal pains. -I recommended doing a bone scan.  We will repeat another PSA and creatinine. - We will send guardant 360 testing.  We will follow-up on germline mutation testing. - Based on Weber test, he might be a candidate for targeted therapy.  If not we will consider him for docetaxel. - I will see him back after the bone scan.  2.  Bone strengthening: -Denosumab was started on 03/05/2017.  He is tolerating it very well.  He will  continue calcium and vitamin D supplements.  3.  CKD: - Last creatinine was 1.76.  We will repeat it because of worsening hydronephrosis.  Total time spent is 25 minutes with more than 50% of the time spent discussing further work-up, counseling and coordination of care.    Orders placed this encounter:  Orders Placed This Encounter  Procedures  . NM Bone Scan Whole Body  . PSA  . Basic metabolic panel      Derek Jack, MD [place] [phone]

## 2019-07-20 NOTE — Patient Instructions (Addendum)
[place] at [facility] Discharge Instructions  You were seen today by Dr. Delton Coombes. He went over your recent lab results. He is going to schedule you for a bone scan. He will see you back after your scan for labs and follow up.   Thank you for choosing [place] at [facility] to provide your oncology and hematology care.  To afford each patient quality time with our provider, please arrive at least 15 minutes before your scheduled appointment time.   If you have a lab appointment with the [facility] please come in thru the  Main Entrance and check in at the main information desk  You need to re-schedule your appointment should you arrive 10 or more minutes late.  We strive to give you quality time with our providers, and arriving late affects you and other patients whose appointments are after yours.  Also, if you no show three or more times for appointments you may be dismissed from the clinic at the providers discretion.     Again, thank you for choosing [facility].  Our hope is that these requests will decrease the amount of time that you wait before being seen by our physicians.       _____________________________________________________________  Should you have questions after your visit to [facility], please contact our office at [phone] between the hours of 8:00 a.m. and 4:30 p.m.  Voicemails left after 4:00 p.m. will not be returned until the following business day.  For prescription refill requests, have your pharmacy contact our office and allow 72 hours.    Cancer Center Support Programs:   > Cancer Support Group  2nd Tuesday of the month 1pm-2pm, Journey Room

## 2019-07-20 NOTE — Assessment & Plan Note (Addendum)
1.  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 -Enzalutamide 160 mg daily started towards the end of November 2019. - PSA has been steadily increasing, last level at 9.37 in first week of September. 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  No evidence of skeletal metastasis.  Chronic compression fracture of T8. - I have told him to discontinue enzalutamide as it is not helping.  It is causing musculoskeletal pains. -I recommended doing a bone scan.  We will repeat another PSA and creatinine. - We will send guardant 360 testing.  We will follow-up on germline mutation testing. - Based on Weber test, he might be a candidate for targeted therapy.  If not we will consider him for docetaxel. - I will see him back after the bone scan.  2.  Bone strengthening: -Denosumab was started on 03/05/2017.  He is tolerating it very well.  He will continue calcium and vitamin D supplements.  3.  CKD: - Last creatinine was 1.76.  We will repeat it because of worsening hydronephrosis.

## 2019-07-21 ENCOUNTER — Telehealth: Payer: Self-pay | Admitting: Genetic Counselor

## 2019-07-21 ENCOUNTER — Ambulatory Visit: Payer: Self-pay | Admitting: Genetic Counselor

## 2019-07-21 DIAGNOSIS — Z1379 Encounter for other screening for genetic and chromosomal anomalies: Secondary | ICD-10-CM

## 2019-07-21 NOTE — Progress Notes (Signed)
HPI:  Mr. Lipsett was previously seen in the [facility] clinic due to a personal and family history of cancer and concerns regarding a hereditary predisposition to cancer. Please refer to our prior cancer genetics clinic note for more information regarding our discussion, assessment and recommendations, at the time. Mr. Litton recent genetic test results were disclosed to him, as were recommendations warranted by these results. These results and recommendations are discussed in more detail below.  CANCER HISTORY:  Oncology History  Prostate cancer metastatic to bone ([place])  04/04/2016 Initial Diagnosis   Prostate cancer metastatic to bone ([place])   07/14/2019 Genetic Testing   Negative genetic testing on the common hereditary cancer panel.  The Common Hereditary Gene Panel offered by [facility] includes sequencing and/or deletion duplication testing of the following 48 genes: APC, ATM, AXIN2, BARD1, BMPR1A, BRCA1, BRCA2, BRIP1, CDH1, CDK4, CDKN2A (p14ARF), CDKN2A (p16INK4a), CHEK2, CTNNA1, DICER1, EPCAM (Deletion/duplication testing only), GREM1 (promoter region deletion/duplication testing only), KIT, MEN1, MLH1, MSH2, MSH3, MSH6, MUTYH, NBN, NF1, NHTL1, PALB2, PDGFRA, PMS2, POLD1, POLE, PTEN, RAD50, RAD51C, RAD51D, RNF43, SDHB, SDHC, SDHD, SMAD4, SMARCA4. STK11, TP53, TSC1, TSC2, and VHL.  The following genes were evaluated for sequence changes only: SDHA and HOXB13 c.251G>A variant only. The report date is July 14, 2019.     FAMILY HISTORY:  We obtained a detailed, 4-generation family history.  Significant diagnoses are listed below: Family History  Problem Relation Age of Onset  . Heart failure Mother        CHF  . Cancer Mother        unknown  . Prostate cancer Father   . Narcolepsy Sister   . Cancer Daughter        unknown  . Breast cancer Maternal Aunt   . Cancer Maternal Grandmother        unknown  . Cancer Maternal Aunt        unknown type of cancer    The  patient has two daughters and a son.  One daughter died of cancer (he is unsure of the type) at 7.  He has three sisters who are cancer free. Both parents are deceased.  The patient's father died of prostate cancer at 83.  He had a brother and three sisters.  The patient does not know if anyone had cancer.  There is no other reported family history of cancer on the paternal side.  The patient's mother died of an unknown cancer.  She had five sisters and a brother.  One sister had breast cancer, and three other sisters had unknown cancers.  The paternal grandparents are deceased.  The grandmother had an unknown cancer.  Mr. Shirer is unaware of previous family history of genetic testing for hereditary cancer risks. Patient's maternal ancestors are of African American descent, and paternal ancestors are of African American descent. There is no reported Ashkenazi Jewish ancestry. There is no known consanguinity.    GENETIC TEST RESULTS: Genetic testing reported out on July 14, 2019 through the common hereditary cancer panel found no pathogenic mutations. The Common Hereditary Gene Panel offered by [facility] includes sequencing and/or deletion duplication testing of the following 48 genes: APC, ATM, AXIN2, BARD1, BMPR1A, BRCA1, BRCA2, BRIP1, CDH1, CDK4, CDKN2A (p14ARF), CDKN2A (p16INK4a), CHEK2, CTNNA1, DICER1, EPCAM (Deletion/duplication testing only), GREM1 (promoter region deletion/duplication testing only), KIT, MEN1, MLH1, MSH2, MSH3, MSH6, MUTYH, NBN, NF1, NHTL1, PALB2, PDGFRA, PMS2, POLD1, POLE, PTEN, RAD50, RAD51C, RAD51D, RNF43, SDHB, SDHC, SDHD, SMAD4, SMARCA4. STK11, TP53, TSC1, TSC2,  and VHL.  The following genes were evaluated for sequence changes only: SDHA and HOXB13 c.251G>A variant only. The test report has been scanned into EPIC and is located under the Molecular Pathology section of the Results Review tab.  A portion of the result report is included below for reference.     We  discussed with Mr. Dorian that because current genetic testing is not perfect, it is possible there may be a gene mutation in one of these genes that current testing cannot detect, but that chance is small.  We also discussed, that there could be another gene that has not yet been discovered, or that we have not yet tested, that is responsible for the cancer diagnoses in the family. It is also possible there is a hereditary cause for the cancer in the family that Mr. Pennings did not inherit and therefore was not identified in his testing.  Therefore, it is important to remain in touch with cancer genetics in the future so that we can continue to offer Mr. Kovacevic the most up to date genetic testing.   ADDITIONAL GENETIC TESTING: We discussed with Mr. Mcnellis that there are other genes that are associated with increased cancer risk that can be analyzed. Should Mr. Merrick wish to pursue additional genetic testing, we are happy to discuss and coordinate this testing, at any time.    CANCER SCREENING RECOMMENDATIONS: Mr. Skelly test result is considered negative (normal).  This means that we have not identified a hereditary cause for his personal and family history of cancer at this time. Most cancers happen by chance and this negative test suggests that his cancer may fall into this category.    While reassuring, this does not definitively rule out a hereditary predisposition to cancer. It is still possible that there could be genetic mutations that are undetectable by current technology. There could be genetic mutations in genes that have not been tested or identified to increase cancer risk.  Therefore, it is recommended he continue to follow the cancer management and screening guidelines provided by his oncology and primary healthcare provider.   An individual's cancer risk and medical management are not determined by genetic test results alone. Overall cancer risk assessment incorporates additional factors,  including personal medical history, family history, and any available genetic information that may result in a personalized plan for cancer prevention and surveillance  RECOMMENDATIONS FOR FAMILY MEMBERS:  Individuals in this family might be at some increased risk of developing cancer, over the general population risk, simply due to the family history of cancer.  We recommended women in this family have a yearly mammogram beginning at age 24, or 51 years younger than the earliest onset of cancer, an annual clinical breast exam, and perform monthly breast self-exams. Women in this family should also have a gynecological exam as recommended by their primary provider. All family members should have a colonoscopy by age 19.  FOLLOW-UP: Lastly, we discussed with Mr. Belland that cancer genetics is a rapidly advancing field and it is possible that new genetic tests will be appropriate for him and/or his family members in the future. We encouraged him to remain in contact with cancer genetics on an annual basis so we can update his personal and family histories and let him know of advances in cancer genetics that may benefit this family.   Our contact number was provided. Mr. Okelley questions were answered to his satisfaction, and he knows he is welcome to call us at anytime  with additional questions or concerns.   Roma Kayser, [place], [facility] Licensed, Certified Genetic Counselor [email or web address]

## 2019-07-21 NOTE — Telephone Encounter (Signed)
Revealed negative genetic testing.  Discussed that we do not know why he has prostate cancer or why there is cancer in the family. It could be due to a different gene that we are not testing, or maybe our current technology may not be able to pick something up.  It will be important for him to keep in contact with genetics to keep up with whether additional testing may be needed.   

## 2019-07-25 ENCOUNTER — Encounter: Payer: Self-pay | Admitting: Hematology

## 2019-07-25 ENCOUNTER — Encounter (HOSPITAL_COMMUNITY): Payer: Self-pay | Admitting: *Deleted

## 2019-07-25 NOTE — Progress Notes (Signed)
Patient was in the office today for [facility] labs.  Labs drawn from left anterior wrist.  Patient tolerated lab draw without incidence.   Labs mailed via fedex to [facility] for processing.

## 2019-07-27 ENCOUNTER — Ambulatory Visit (HOSPITAL_COMMUNITY)
Admission: RE | Admit: 2019-07-27 | Discharge: 2019-07-27 | Disposition: A | Discharge status: Home / Self Care | Payer: Medicare Other | Source: Ambulatory Visit | Attending: Hematology | Admitting: Hematology

## 2019-07-27 ENCOUNTER — Inpatient Hospital Stay (HOSPITAL_COMMUNITY): Payer: Medicare Other

## 2019-07-27 ENCOUNTER — Other Ambulatory Visit: Payer: Self-pay

## 2019-07-27 DIAGNOSIS — N189 Chronic kidney disease, unspecified: Secondary | ICD-10-CM | POA: Diagnosis not present

## 2019-07-27 DIAGNOSIS — C7951 Secondary malignant neoplasm of bone: Secondary | ICD-10-CM | POA: Diagnosis not present

## 2019-07-27 DIAGNOSIS — M7918 Myalgia, other site: Secondary | ICD-10-CM | POA: Diagnosis not present

## 2019-07-27 DIAGNOSIS — Z803 Family history of malignant neoplasm of breast: Secondary | ICD-10-CM | POA: Diagnosis not present

## 2019-07-27 DIAGNOSIS — C61 Malignant neoplasm of prostate: Secondary | ICD-10-CM

## 2019-07-27 DIAGNOSIS — Z809 Family history of malignant neoplasm, unspecified: Secondary | ICD-10-CM | POA: Diagnosis not present

## 2019-07-27 DIAGNOSIS — Z87891 Personal history of nicotine dependence: Secondary | ICD-10-CM | POA: Diagnosis not present

## 2019-07-27 DIAGNOSIS — N1330 Unspecified hydronephrosis: Secondary | ICD-10-CM | POA: Diagnosis not present

## 2019-07-27 LAB — BASIC METABOLIC PANEL
Anion gap: 10 (ref 5–15)
BUN: 23 mg/dL (ref 8–23)
CO2: 25 mmol/L (ref 22–32)
Calcium: 8.9 mg/dL (ref 8.9–10.3)
Chloride: 101 mmol/L (ref 98–111)
Creatinine, Ser: 1.83 mg/dL — ABNORMAL HIGH (ref 0.61–1.24)
GFR calc Af Amer: 40 mL/min — ABNORMAL LOW (ref >60)
GFR calc non Af Amer: 34 mL/min — ABNORMAL LOW (ref >60)
Glucose, Bld: 92 mg/dL (ref 70–99)
Potassium: 4.3 mmol/L (ref 3.5–5.1)
Sodium: 136 mmol/L (ref 135–145)

## 2019-07-27 LAB — PSA: Prostatic Specific Antigen: 18.79 ng/mL — ABNORMAL HIGH (ref 0.00–4.00)

## 2019-07-27 MED ORDER — TECHNETIUM TC 99M MEDRONATE IV KIT
20.0000 | PACK | Freq: Once | INTRAVENOUS | Status: AC | PRN
  Administered 2019-07-27: 18.7 via INTRAVENOUS

## 2019-07-28 ENCOUNTER — Encounter (HOSPITAL_COMMUNITY): Payer: Self-pay | Admitting: Hematology

## 2019-07-28 ENCOUNTER — Inpatient Hospital Stay (HOSPITAL_COMMUNITY): Payer: Medicare Other | Attending: Hematology | Admitting: Hematology

## 2019-07-28 DIAGNOSIS — Z23 Encounter for immunization: Secondary | ICD-10-CM | POA: Insufficient documentation

## 2019-07-28 DIAGNOSIS — C61 Malignant neoplasm of prostate: Secondary | ICD-10-CM | POA: Diagnosis present

## 2019-07-28 DIAGNOSIS — C7951 Secondary malignant neoplasm of bone: Secondary | ICD-10-CM | POA: Diagnosis not present

## 2019-07-28 MED ORDER — INFLUENZA VAC A&B SA ADJ QUAD 0.5 ML IM PRSY
0.5000 mL | PREFILLED_SYRINGE | Freq: Once | INTRAMUSCULAR | Status: AC
  Administered 2019-07-28: 12:00:00 0.5 mL via INTRAMUSCULAR
  Filled 2019-07-28: qty 0.5

## 2019-07-28 NOTE — Patient Instructions (Signed)
[facility] at [facility] Discharge Instructions  You were seen today by Dr. Katragadda. He went over your recent lab results. He will see you back in  for labs and follow up.   Thank you for choosing  [facility] at [facility] to provide your oncology and hematology care.  To afford each patient quality time with our provider, please arrive at least 15 minutes before your scheduled appointment time.   If you have a lab appointment with the Cancer Center please come in thru the  Main Entrance and check in at the main information desk  You need to re-schedule your appointment should you arrive 10 or more minutes late.  We strive to give you quality time with our providers, and arriving late affects you and other patients whose appointments are after yours.  Also, if you no show three or more times for appointments you may be dismissed from the clinic at the providers discretion.     Again, thank you for choosing [facility].  Our hope is that these requests will decrease the amount of time that you wait before being seen by our physicians.       _____________________________________________________________  Should you have questions after your visit to  Cancer Center, please contact our office at [phone] between the hours of 8:00 a.m. and 4:30 p.m.  Voicemails left after 4:00 p.m. will not be returned until the following business day.  For prescription refill requests, have your pharmacy contact our office and allow 72 hours.    Cancer Center Support Programs:   > Cancer Support Group  2nd Tuesday of the month 1pm-2pm, Journey Room    

## 2019-07-28 NOTE — Progress Notes (Signed)
[facility], [place] [address]   CLINIC:  Medical Oncology/Hematology  PCP:  Celene Squibb, MD [place] [address] [phone]   REASON FOR VISIT:  Follow-up for metastatic prostate cancer    INTERVAL HISTORY:  Mr. Centola 78 y.o. 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  He is hard of hearing.  Appetite is reported as 75%.  Energy levels are 50%.  No pain is reported.  Mild ankle swelling is stable.  Denies any fevers or night sweats.  Denies any tingling or numbness in the extremities.  REVIEW OF SYSTEMS:  Review of Systems  Cardiovascular: Positive for leg swelling.  All other systems reviewed and are negative.    PAST MEDICAL/SURGICAL HISTORY:  Past Medical History:  Diagnosis Date  . Cancer [facility])    prostate  . Family history of breast cancer   . Family history of prostate cancer   . GERD (gastroesophageal reflux disease)   . HOH (hard of hearing)   . Hyperlipidemia   . Hypertension   . Prostate cancer metastatic to bone ([place]) 04/04/2016  . Sleep apnea 10/24/2016   Past Surgical History:  Procedure Laterality Date  . CATARACT EXTRACTION W/PHACO Right 04/22/2019   Procedure: CATARACT EXTRACTION PHACO AND INTRAOCULAR LENS PLACEMENT RIGHT EYE;  Surgeon: Baruch Goldmann, MD;  Location: [facility];  Service: Ophthalmology;  Laterality: Right;  right  . CATARACT EXTRACTION W/PHACO Left 05/13/2019   Procedure: CATARACT EXTRACTION PHACO AND INTRAOCULAR LENS PLACEMENT LEFT EYE;  Surgeon: Baruch Goldmann, MD;  Location: [facility];  Service: Ophthalmology;  Laterality: Left;  left  . CHOLECYSTECTOMY    . CYSTOSCOPY W/ URETERAL STENT PLACEMENT Left 08/17/2019   Procedure: CYSTOSCOPY WITH RETROGRADE PYELOGRAM/URETERAL STENT PLACEMENT;  Surgeon: Cleon Gustin, MD;  Location: [facility];  Service: Urology;  Laterality: Left;  . HAND SURGERY    . INGUINAL HERNIA REPAIR Left   . PROSTATECTOMY    .  PROSTATECTOMY    . radical prostatectomy    . TONSILLECTOMY       SOCIAL HISTORY:  Social History   Socioeconomic History  . Marital status: Widowed    Spouse name: Not on file  . Number of children: 3  . Years of education: 9  . Highest education level: Not on file  Occupational History  . Occupation: Animator- retired    Comment: central telephone  Social Needs  . Financial resource strain: Not on file  . Food insecurity    Worry: Not on file    Inability: Not on file  . Transportation needs    Medical: Not on file    Non-medical: Not on file  Tobacco Use  . Smoking status: Former Smoker    Packs/day: 1.00    Years: 32.00    Pack years: 32.00    Types: Cigarettes    Quit date: 02/04/1989    Years since quitting: 30.5  . Smokeless tobacco: Never Used  Substance and Sexual Activity  . Alcohol use: No  . Drug use: No  . Sexual activity: Not Currently  Lifestyle  . Physical activity    Days per week: Not on file    Minutes per session: Not on file  . Stress: Not on file  Relationships  . Social Herbalist on phone: Not on file    Gets together: Not on file    Attends religious service: Not on file  Active member of club or organization: Not on file    Attends meetings of clubs or organizations: Not on file    Relationship status: Not on file  . Intimate partner violence    Fear of current or ex partner: Not on file    Emotionally abused: Not on file    Physically abused: Not on file    Forced sexual activity: Not on file  Other Topics Concern  . Not on file  Social History Narrative   Widowed   Central with son   Chronic low back pain    FAMILY HISTORY:  Family History  Problem Relation Age of Onset  . Heart failure Mother        CHF  . Cancer Mother        unknown  . Prostate cancer Father   . Narcolepsy Sister   . Cancer Daughter        unknown  . Breast cancer Maternal Aunt   . Cancer Maternal Grandmother        unknown  .  Cancer Maternal Aunt        unknown type of cancer    CURRENT MEDICATIONS:  Outpatient Encounter Medications as of 07/28/2019  Medication Sig Note  . benazepril (LOTENSIN) 20 MG tablet TAKE ONE TABLET BY MOUTH DAILY.   . calcium-vitamin D (OSCAL WITH D) 500-200 MG-UNIT TABS tablet Take 1 tablet by mouth daily.   . cetirizine (ZYRTEC) 10 MG tablet TAKE ONE TABLET BY MOUTH DAILY.   . clotrimazole-betamethasone (LOTRISONE) cream Apply 1 application topically 2 (two) times daily.   . Ferrous Sulfate (IRON) 325 (65 Fe) MG TABS Take 1 tablet by mouth daily.   . fexofenadine (ALLEGRA) 180 MG tablet Take 180 mg by mouth daily. 11/02/2018: Not sure which allergy medication he is currently taking   . fluticasone (FLONASE) 50 MCG/ACT nasal spray USE ONE SPRAY IN EACH NOSTRIL ONCE DAILY. SHAKE GENTLY BEFORE USING.   . furosemide (LASIX) 40 MG tablet TAKE ONE TABLET BY MOUTH DAILY. TAKE WITH POTASSIUM.   Marland Kitchen hydrochlorothiazide (HYDRODIURIL) 25 MG tablet TAKE ONE TABLET BY MOUTH DAILY.   Marland Kitchen ILEVRO 0.3 % ophthalmic suspension INSTILL ONE DROP IN THE OPERATIVE EYE DAILY. START TWO DAYS BEFORE SURGERY AND CONTINUE AS DIRECTED.   Marland Kitchen ketoconazole (NIZORAL) 2 % cream APPLY A SMALL AMOUNT TO FACE RASH TWICE DAILY FOR UP TO FOUR WEEKS IF NEEDED.   Marland Kitchen losartan (COZAAR) 100 MG tablet Take 100 mg by mouth daily.   . meloxicam (MOBIC) 7.5 MG tablet Take 1 tablet (7.5 mg total) daily by mouth.   . methylphenidate (RITALIN) 20 MG tablet Take 20 mg by mouth daily.   . mirabegron ER (MYRBETRIQ) 25 MG TB24 tablet Take by mouth.   . moxifloxacin (VIGAMOX) 0.5 % ophthalmic solution INSTILL ONE DROP THREE TIMES DAILY IN THE OPERATIVE EYE. START TWO DAYS BEFORE SURGERY AND CONTINUE AS DIRECTED.   Marland Kitchen potassium chloride SA (K-DUR,KLOR-CON) 20 MEQ tablet Take 20 mEq by mouth daily.   . prednisoLONE acetate (PRED FORTE) 1 % ophthalmic suspension INSTILL ONE DROP IN THE OPERATIVE EYE THREE TIMES DAILY. START ONE DAY AFTER SURGERY AND  CONTINUE AS DIRECTED.   Marland Kitchen selenium sulfide (SELSUN) 2.5 % shampoo Apply 1 application topically daily as needed for irritation.   . traMADol (ULTRAM) 50 MG tablet TAKE ONE TABLET BY MOUTH EVERY 6 HOURS AS NEEDED FOR PAIN   . [DISCONTINUED] XTANDI 40 MG capsule TAKE 4 CAPSULES (160 MG TOTAL) BY  MOUTH DAILY.   . [EXPIRED] influenza vaccine adjuvanted (FLUAD) injection 0.5 mL     No facility-administered encounter medications on file as of 07/28/2019.     ALLERGIES:  No Known Allergies   PHYSICAL EXAM:  ECOG Performance status: 1  Vitals:   07/28/19 1122  BP: 128/66  Pulse: 74  Resp: 16  Temp: (!) 97.1 F (36.2 C)  SpO2: 99%   Filed Weights   07/28/19 1122  Weight: 275 lb 11.2 oz (125.1 kg)    Physical Exam Vitals signs reviewed.  Constitutional:      Appearance: Normal appearance.  Cardiovascular:     Rate and Rhythm: Normal rate and regular rhythm.     Heart sounds: Normal heart sounds.  Pulmonary:     Effort: Pulmonary effort is normal.     Breath sounds: Normal breath sounds.  Abdominal:     General: There is no distension.     Palpations: Abdomen is soft. There is no mass.  Musculoskeletal:        General: Swelling present.  Skin:    General: Skin is warm.  Neurological:     General: No focal deficit present.     Mental Status: He is alert and oriented to person, place, and time.  Psychiatric:        Mood and Affect: Mood normal.        Behavior: Behavior normal.      LABORATORY DATA:  I have reviewed the labs as listed.  CBC    Component Value Date/Time   WBC 4.9 08/11/2019 0825   RBC 4.23 08/11/2019 0825   HGB 12.1 (L) 08/11/2019 0825   HCT 38.7 (L) 08/11/2019 0825   PLT 182 08/11/2019 0825   MCV 91.5 08/11/2019 0825   MCH 28.6 08/11/2019 0825   MCHC 31.3 08/11/2019 0825   RDW 14.9 08/11/2019 0825   LYMPHSABS 1.9 08/11/2019 0825   MONOABS 0.3 08/11/2019 0825   EOSABS 0.1 08/11/2019 0825   BASOSABS 0.0 08/11/2019 0825   CMP Latest Ref Rng  & Units 08/11/2019 07/27/2019 07/06/2019  Glucose 70 - 99 mg/dL 110(H) 92 118(H)  BUN 8 - 23 mg/dL 24(H) 23 14  Creatinine 0.61 - 1.24 mg/dL 1.86(H) 1.83(H) 1.76(H)  Sodium 135 - 145 mmol/L 141 136 137  Potassium 3.5 - 5.1 mmol/L 4.1 4.3 4.1  Chloride 98 - 111 mmol/L 104 101 103  CO2 22 - 32 mmol/L 28 25 24   Calcium 8.9 - 10.3 mg/dL 9.4 8.9 9.2  Total Protein 6.5 - 8.1 g/dL 7.4 - 6.8  Total Bilirubin 0.3 - 1.2 mg/dL 0.7 - 0.8  Alkaline Phos 38 - 126 U/L 61 - 67  AST 15 - 41 U/L 23 - 26  ALT 0 - 44 U/L 14 - 18       DIAGNOSTIC IMAGING:  I have independently reviewed the scans and discussed with the patient.   I have reviewed Venita Lick LPN's note and agree with the documentation.  I personally performed a face-to-face visit, made revisions and my assessment and plan is as follows.    ASSESSMENT & PLAN:   Prostate cancer metastatic to bone ([place]) 1.  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 -Enzalutamide 160 mg daily started towards the end of November 2019. - PSA has been steadily increasing, last level  at 9.37 in first week of September. 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  No evidence of skeletal metastasis.  Chronic compression fracture of T8. - I have told him to discontinue enzalutamide as it is not helping.  It is causing musculoskeletal pains. -I recommended doing a bone scan.  We will repeat another PSA and creatinine. -Guardant 360 did not show any mutation. -Germline mutation testing is also negative. -He will have ureteral stent placed by urology.  I have discussed with him about starting him on  palliative chemotherapy with docetaxel.  We also discussed other option including best supportive care with hospice.  Patient opted for treatment. -We discussed side effects of treatment.  Our chemotherapy nurse also talked him in detail. -We will make referral for port placement.  2.  Bone strengthening: -Denosumab was started on 03/05/2017.  He is tolerating it very well.  He will continue calcium and vitamin D supplements.  3.  CKD: - Last creatinine was 1.76.  We will repeat it because of worsening hydronephrosis.  Total time spent is 25 minutes with more than 50% of the time spent discussing further treatment plan, counseling and coordination of care.    Orders placed this encounter:  No orders of the defined types were placed in this encounter.     Derek Jack, MD [place] [phone]

## 2019-08-01 DIAGNOSIS — C7951 Secondary malignant neoplasm of bone: Secondary | ICD-10-CM | POA: Diagnosis not present

## 2019-08-03 ENCOUNTER — Other Ambulatory Visit (HOSPITAL_COMMUNITY): Payer: Medicare Other

## 2019-08-03 ENCOUNTER — Ambulatory Visit (HOSPITAL_COMMUNITY): Payer: Medicare Other

## 2019-08-04 ENCOUNTER — Ambulatory Visit (HOSPITAL_COMMUNITY): Payer: Medicare Other | Admitting: Hematology

## 2019-08-10 ENCOUNTER — Ambulatory Visit (INDEPENDENT_AMBULATORY_CARE_PROVIDER_SITE_OTHER): Payer: Medicare Other | Admitting: Urology

## 2019-08-10 ENCOUNTER — Other Ambulatory Visit: Payer: Self-pay

## 2019-08-10 DIAGNOSIS — C61 Malignant neoplasm of prostate: Secondary | ICD-10-CM | POA: Diagnosis not present

## 2019-08-10 DIAGNOSIS — N131 Hydronephrosis with ureteral stricture, not elsewhere classified: Secondary | ICD-10-CM | POA: Diagnosis not present

## 2019-08-11 ENCOUNTER — Inpatient Hospital Stay (HOSPITAL_BASED_OUTPATIENT_CLINIC_OR_DEPARTMENT_OTHER): Payer: Medicare Other | Admitting: Hematology

## 2019-08-11 ENCOUNTER — Encounter (HOSPITAL_COMMUNITY): Payer: Self-pay | Admitting: Hematology

## 2019-08-11 ENCOUNTER — Inpatient Hospital Stay (HOSPITAL_COMMUNITY): Payer: Medicare Other

## 2019-08-11 DIAGNOSIS — C7951 Secondary malignant neoplasm of bone: Secondary | ICD-10-CM

## 2019-08-11 DIAGNOSIS — C61 Malignant neoplasm of prostate: Secondary | ICD-10-CM

## 2019-08-11 DIAGNOSIS — Z23 Encounter for immunization: Secondary | ICD-10-CM | POA: Diagnosis not present

## 2019-08-11 LAB — CBC WITH DIFFERENTIAL/PLATELET
Abs Immature Granulocytes: 0.01 10*3/uL (ref 0.00–0.07)
Basophils Absolute: 0 10*3/uL (ref 0.0–0.1)
Basophils Relative: 0 %
Eosinophils Absolute: 0.1 10*3/uL (ref 0.0–0.5)
Eosinophils Relative: 2 %
HCT: 38.7 % — ABNORMAL LOW (ref 39.0–52.0)
Hemoglobin: 12.1 g/dL — ABNORMAL LOW (ref 13.0–17.0)
Immature Granulocytes: 0 %
Lymphocytes Relative: 39 %
Lymphs Abs: 1.9 10*3/uL (ref 0.7–4.0)
MCH: 28.6 pg (ref 26.0–34.0)
MCHC: 31.3 g/dL (ref 30.0–36.0)
MCV: 91.5 fL (ref 80.0–100.0)
Monocytes Absolute: 0.3 10*3/uL (ref 0.1–1.0)
Monocytes Relative: 6 %
Neutro Abs: 2.6 10*3/uL (ref 1.7–7.7)
Neutrophils Relative %: 53 %
Platelets: 182 10*3/uL (ref 150–400)
RBC: 4.23 MIL/uL (ref 4.22–5.81)
RDW: 14.9 % (ref 11.5–15.5)
WBC: 4.9 10*3/uL (ref 4.0–10.5)
nRBC: 0 % (ref 0.0–0.2)

## 2019-08-11 LAB — COMPREHENSIVE METABOLIC PANEL
ALT: 14 U/L (ref 0–44)
AST: 23 U/L (ref 15–41)
Albumin: 4 g/dL (ref 3.5–5.0)
Alkaline Phosphatase: 61 U/L (ref 38–126)
Anion gap: 9 (ref 5–15)
BUN: 24 mg/dL — ABNORMAL HIGH (ref 8–23)
CO2: 28 mmol/L (ref 22–32)
Calcium: 9.4 mg/dL (ref 8.9–10.3)
Chloride: 104 mmol/L (ref 98–111)
Creatinine, Ser: 1.86 mg/dL — ABNORMAL HIGH (ref 0.61–1.24)
GFR calc Af Amer: 39 mL/min — ABNORMAL LOW (ref >60)
GFR calc non Af Amer: 34 mL/min — ABNORMAL LOW (ref >60)
Glucose, Bld: 110 mg/dL — ABNORMAL HIGH (ref 70–99)
Potassium: 4.1 mmol/L (ref 3.5–5.1)
Sodium: 141 mmol/L (ref 135–145)
Total Bilirubin: 0.7 mg/dL (ref 0.3–1.2)
Total Protein: 7.4 g/dL (ref 6.5–8.1)

## 2019-08-11 LAB — PSA: Prostatic Specific Antigen: 16.71 ng/mL — ABNORMAL HIGH (ref 0.00–4.00)

## 2019-08-11 MED ORDER — DENOSUMAB 120 MG/1.7ML ~~LOC~~ SOLN
120.0000 mg | Freq: Once | SUBCUTANEOUS | Status: AC
  Administered 2019-08-11: 120 mg via SUBCUTANEOUS
  Filled 2019-08-11: qty 1.7

## 2019-08-11 NOTE — Patient Instructions (Addendum)
[place] at [facility] Discharge Instructions  You were seen today by Dr. Delton Coombes. He went over your recent lab results. You will need chemotherapy and a port-a-cath placed. He will see you back in 2 weeks for labs, treatment and follow up.   Thank you for choosing [place] at [facility] to provide your oncology and hematology care.  To afford each patient quality time with our provider, please arrive at least 15 minutes before your scheduled appointment time.   If you have a lab appointment with the [facility] please come in thru the  Main Entrance and check in at the main information desk  You need to re-schedule your appointment should you arrive 10 or more minutes late.  We strive to give you quality time with our providers, and arriving late affects you and other patients whose appointments are after yours.  Also, if you no show three or more times for appointments you may be dismissed from the clinic at the providers discretion.     Again, thank you for choosing [facility].  Our hope is that these requests will decrease the amount of time that you wait before being seen by our physicians.       _____________________________________________________________  Should you have questions after your visit to [facility], please contact our office at [phone] between the hours of 8:00 a.m. and 4:30 p.m.  Voicemails left after 4:00 p.m. will not be returned until the following business day.  For prescription refill requests, have your pharmacy contact our office and allow 72 hours.    Cancer Center Support Programs:   > Cancer Support Group  2nd Tuesday of the month 1pm-2pm, Journey Room

## 2019-08-11 NOTE — Progress Notes (Signed)
I met with patient today following visit with Dr. Delton Coombes.  He is very hard of hearing and I did not get that he understood everything that I was saying.  I provided written education to him on docetaxel and explained the purpose of the medication and side effects.  I told him when he comes next week, he needs to bring someone with him to better understand what we are telling him.  I explained to him that he does not need to take the Xtandi moving forward and he started asking me the size of the pill.  Saying " I take a big white one and then another big one".  I asked him and wrote on the top of his discharge papers to bring all of his medications with him when he comes next week.  He verbalizes understanding.

## 2019-08-11 NOTE — Assessment & Plan Note (Addendum)
1.  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 -Enzalutamide 160 mg daily started towards the end of November 2019 and discontinued in September 2020 upon progression. 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  No evidence of skeletal metastasis.  Chronic compression fracture of T8. -Germline mutation testing was negative.  Guardant 360 did not show any FDA approved therapies. -Bone scan on 07/27/2019 shows uptake at T8.  No other uptake.  PSA today 16.8. -I have recommended chemotherapy with docetaxel as abiraterone is unlikely to work after enzalutamide.  We will have to start at lower doses of docetaxel and see how he tolerates it. -It has been a challenge to communicate with him as he is hard of hearing.  One of our nurses was able to communicate with him well.  He is agreeable to the treatment plan.  He needs a port placement. -He was told to bring his sister or son with him next visit.  2.  Bone strengthening: -Denosumab was started on 03/05/2017.  He is tolerating it very well.  He will continue calcium and vitamin D supplements.  3.  CKD: -Creatinine increased to 1.8 because of hydronephrosis.

## 2019-08-11 NOTE — Progress Notes (Signed)
Patient tolerated injection with no complaints voiced.  Site clean and dry with no bruising or swelling noted at site.  Band aid applied.  VSS with discharge and left ambulatory with no s/s of distress noted.    

## 2019-08-11 NOTE — Progress Notes (Signed)
[facility] [address] [place] [address]   CLINIC:  Medical Oncology/Hematology  PCP:  Celene Squibb, MD [address] [place] [address] [phone]   REASON FOR VISIT:  Follow-up for metastatic prostate cancer    INTERVAL HISTORY:  [name] 78 y.o. male seen for follow-up of metastatic CRPC.  Enzalutamide was discontinued.  Appetite is 50%.  Energy levels are 75%.  He reports pain in the left shoulder region.  Denies any fevers, night sweats or weight loss.  No new pains were reported.  Denies nausea, vomiting, diarrhea or constipation.  No falls reported.  He lives at home by himself.  REVIEW OF SYSTEMS:  Review of Systems  All other systems reviewed and are negative.    PAST MEDICAL/SURGICAL HISTORY:  Past Medical History:  Diagnosis Date  . Cancer [facility])    prostate  . Family history of breast cancer   . Family history of prostate cancer   . GERD (gastroesophageal reflux disease)   . Hyperlipidemia   . Hypertension   . Prostate cancer metastatic to bone ([place]) 04/04/2016  . Sleep apnea 10/24/2016   Past Surgical History:  Procedure Laterality Date  . CATARACT EXTRACTION W/PHACO Right 04/22/2019   Procedure: CATARACT EXTRACTION PHACO AND INTRAOCULAR LENS PLACEMENT RIGHT EYE;  Surgeon: Baruch Goldmann, MD;  Location: [facility];  Service: Ophthalmology;  Laterality: Right;  right  . CATARACT EXTRACTION W/PHACO Left 05/13/2019   Procedure: CATARACT EXTRACTION PHACO AND INTRAOCULAR LENS PLACEMENT LEFT EYE;  Surgeon: Baruch Goldmann, MD;  Location: [facility];  Service: Ophthalmology;  Laterality: Left;  left  . CHOLECYSTECTOMY    . HAND SURGERY    . INGUINAL HERNIA REPAIR Left   . PROSTATECTOMY    . PROSTATECTOMY    . radical prostatectomy    . TONSILLECTOMY       SOCIAL HISTORY:  Social History   Socioeconomic History  . Marital status: Widowed    Spouse name: Not on file  . Number of children: 3  . Years of education: 9  . Highest  education level: Not on file  Occupational History  . Occupation: Animator- retired    Comment: [employer]  Social Needs  . Financial resource strain: Not on file  . Food insecurity    Worry: Not on file    Inability: Not on file  . Transportation needs    Medical: Not on file    Non-medical: Not on file  Tobacco Use  . Smoking status: Former Smoker    Packs/day: 1.00    Years: 32.00    Pack years: 32.00    Types: Cigarettes    Quit date: 02/04/1989    Years since quitting: 30.5  . Smokeless tobacco: Never Used  Substance and Sexual Activity  . Alcohol use: No  . Drug use: No  . Sexual activity: Not Currently  Lifestyle  . Physical activity    Days per week: Not on file    Minutes per session: Not on file  . Stress: Not on file  Relationships  . Social Herbalist on phone: Not on file    Gets together: Not on file    Attends religious service: Not on file    Active member of club or organization: Not on file    Attends meetings of clubs or organizations: Not on file    Relationship status: Not on file  . Intimate partner violence    Fear  of current or ex partner: Not on file    Emotionally abused: Not on file    Physically abused: Not on file    Forced sexual activity: Not on file  Other Topics Concern  . Not on file  Social History Narrative   Widowed   [place] with son   Chronic low back pain    FAMILY HISTORY:  Family History  Problem Relation Age of Onset  . Heart failure Mother        CHF  . Cancer Mother        unknown  . Prostate cancer Father   . Narcolepsy Sister   . Cancer Daughter        unknown  . Breast cancer Maternal Aunt   . Cancer Maternal Grandmother        unknown  . Cancer Maternal Aunt        unknown type of cancer    CURRENT MEDICATIONS:  Outpatient Encounter Medications as of 08/11/2019  Medication Sig Note  . benazepril (LOTENSIN) 20 MG tablet TAKE ONE TABLET BY MOUTH DAILY.   . calcium-vitamin D (OSCAL  WITH D) 500-200 MG-UNIT TABS tablet Take 1 tablet by mouth daily.   . cetirizine (ZYRTEC) 10 MG tablet TAKE ONE TABLET BY MOUTH DAILY.   Marland Kitchen Ferrous Sulfate (IRON) 325 (65 Fe) MG TABS Take 1 tablet by mouth daily.   . fexofenadine (ALLEGRA) 180 MG tablet Take 180 mg by mouth daily. 11/02/2018: Not sure which allergy medication he is currently taking   . furosemide (LASIX) 40 MG tablet TAKE ONE TABLET BY MOUTH DAILY. TAKE WITH POTASSIUM.   Marland Kitchen hydrochlorothiazide (HYDRODIURIL) 25 MG tablet TAKE ONE TABLET BY MOUTH DAILY.   Marland Kitchen ILEVRO 0.3 % ophthalmic suspension INSTILL ONE DROP IN THE OPERATIVE EYE DAILY. START TWO DAYS BEFORE SURGERY AND CONTINUE AS DIRECTED.   Marland Kitchen ketoconazole (NIZORAL) 2 % cream APPLY A SMALL AMOUNT TO FACE RASH TWICE DAILY FOR UP TO FOUR WEEKS IF NEEDED.   Marland Kitchen losartan (COZAAR) 100 MG tablet Take 100 mg by mouth daily.   . meloxicam (MOBIC) 7.5 MG tablet Take 1 tablet (7.5 mg total) daily by mouth.   . methylphenidate (RITALIN) 20 MG tablet Take 20 mg by mouth daily.   . mirabegron ER (MYRBETRIQ) 25 MG TB24 tablet Take by mouth.   . moxifloxacin (VIGAMOX) 0.5 % ophthalmic solution INSTILL ONE DROP THREE TIMES DAILY IN THE OPERATIVE EYE. START TWO DAYS BEFORE SURGERY AND CONTINUE AS DIRECTED.   Marland Kitchen potassium chloride SA (K-DUR,KLOR-CON) 20 MEQ tablet Take 20 mEq by mouth daily.   . prednisoLONE acetate (PRED FORTE) 1 % ophthalmic suspension INSTILL ONE DROP IN THE OPERATIVE EYE THREE TIMES DAILY. START ONE DAY AFTER SURGERY AND CONTINUE AS DIRECTED.   Marland Kitchen traMADol (ULTRAM) 50 MG tablet TAKE ONE TABLET BY MOUTH EVERY 6 HOURS AS NEEDED FOR PAIN   . [DISCONTINUED] XTANDI 40 MG capsule TAKE 4 CAPSULES (160 MG TOTAL) BY MOUTH DAILY.   . clotrimazole-betamethasone (LOTRISONE) cream Apply 1 application topically 2 (two) times daily. (Patient not taking: Reported on 08/11/2019)   . fluticasone (FLONASE) 50 MCG/ACT nasal spray USE ONE SPRAY IN EACH NOSTRIL ONCE DAILY. SHAKE GENTLY BEFORE USING.   .  selenium sulfide (SELSUN) 2.5 % shampoo Apply 1 application topically daily as needed for irritation. (Patient not taking: Reported on 07/20/2019)    No facility-administered encounter medications on file as of 08/11/2019.     ALLERGIES:  No Known Allergies   PHYSICAL  EXAM:  ECOG Performance status: 1  Vitals:   08/11/19 1149  BP: (!) 152/66  Pulse: 62  Resp: 18  Temp: 97.7 F (36.5 C)  SpO2: 100%   Filed Weights   08/11/19 1149  Weight: 277 lb 9.6 oz (125.9 kg)    Physical Exam Vitals signs reviewed.  Constitutional:      Appearance: Normal appearance.  Cardiovascular:     Rate and Rhythm: Normal rate and regular rhythm.     Heart sounds: Normal heart sounds.  Pulmonary:     Effort: Pulmonary effort is normal.     Breath sounds: Normal breath sounds.  Abdominal:     General: There is no distension.     Palpations: Abdomen is soft. There is no mass.  Musculoskeletal:        General: Swelling present.  Skin:    General: Skin is warm.  Neurological:     General: No focal deficit present.     Mental Status: He is alert and oriented to person, place, and time.  Psychiatric:        Mood and Affect: Mood normal.        Behavior: Behavior normal.      LABORATORY DATA:  I have reviewed the labs as listed.  CBC    Component Value Date/Time   WBC 4.9 08/11/2019 0825   RBC 4.23 08/11/2019 0825   HGB 12.1 (L) 08/11/2019 0825   HCT 38.7 (L) 08/11/2019 0825   PLT 182 08/11/2019 0825   MCV 91.5 08/11/2019 0825   MCH 28.6 08/11/2019 0825   MCHC 31.3 08/11/2019 0825   RDW 14.9 08/11/2019 0825   LYMPHSABS 1.9 08/11/2019 0825   MONOABS 0.3 08/11/2019 0825   EOSABS 0.1 08/11/2019 0825   BASOSABS 0.0 08/11/2019 0825   CMP Latest Ref Rng & Units 08/11/2019 07/27/2019 07/06/2019  Glucose 70 - 99 mg/dL 110(H) 92 118(H)  BUN 8 - 23 mg/dL 24(H) 23 14  Creatinine 0.61 - 1.24 mg/dL 1.86(H) 1.83(H) 1.76(H)  Sodium 135 - 145 mmol/L 141 136 137  Potassium 3.5 - 5.1 mmol/L  4.1 4.3 4.1  Chloride 98 - 111 mmol/L 104 101 103  CO2 22 - 32 mmol/L 28 25 24   Calcium 8.9 - 10.3 mg/dL 9.4 8.9 9.2  Total Protein 6.5 - 8.1 g/dL 7.4 - 6.8  Total Bilirubin 0.3 - 1.2 mg/dL 0.7 - 0.8  Alkaline Phos 38 - 126 U/L 61 - 67  AST 15 - 41 U/L 23 - 26  ALT 0 - 44 U/L 14 - 18       DIAGNOSTIC IMAGING:  I have independently reviewed the scans and discussed with the patient.   I have reviewed Venita Lick LPN's note and agree with the documentation.  I personally performed a face-to-face visit, made revisions and my assessment and plan is as follows.    ASSESSMENT & PLAN:   Prostate cancer metastatic to bone ([place]) 1.  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 -Enzalutamide 160 mg daily started towards the end of November 2019 and discontinued in September 2020 upon progression. 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  No evidence of skeletal metastasis.  Chronic compression fracture of  T8. -Germline mutation testing was negative.  Guardant 360 did not show any FDA approved therapies. -Bone scan on 07/27/2019 shows uptake at T8.  No other uptake.  PSA today 16.8. -I have recommended chemotherapy with docetaxel as abiraterone is unlikely to work after enzalutamide.  We will have to start at lower doses of docetaxel and see how he tolerates it. -It has been a challenge to communicate with him as he is hard of hearing.  One of our nurses was able to communicate with him well.  He is agreeable to the treatment plan.  He needs a port placement. -He was told to bring his sister or son with him next visit.  2.  Bone  strengthening: -Denosumab was started on 03/05/2017.  He is tolerating it very well.  He will continue calcium and vitamin D supplements.  3.  CKD: -Creatinine increased to 1.8 because of hydronephrosis.  Total time spent is 40 minutes with more than 50% of the time spent discussing new treatment plan, counseling and coordination of care.  Orders placed this encounter:  No orders of the defined types were placed in this encounter.     Derek Jack, MD [place] [phone]

## 2019-08-12 ENCOUNTER — Other Ambulatory Visit: Payer: Self-pay | Admitting: Urology

## 2019-08-12 NOTE — Patient Instructions (Signed)
[name]  08/12/2019     @PREFPERIOPPHARMACY @   Your procedure is scheduled on  08/17/2019 .  Report to [facility] at  [place]  A.M.  Call this number if you have problems the morning of surgery:  [phone]   Remember:  Do not eat or drink after midnight.                        Take these medicines the morning of surgery with A SIP OF WATER  Zyrtec, allegra, mobic, ritalin, tramadol.    Do not wear jewelry, make-up or nail polish.  Do not wear lotions, powders, or perfumes. Please wear deodorant and brush your teeth.  Do not shave 48 hours prior to surgery.  Men may shave face and neck.  Do not bring valuables to the hospital.  [facility] is not responsible for any belongings or valuables.  Contacts, dentures or bridgework may not be worn into surgery.  Leave your suitcase in the car.  After surgery it may be brought to your room.  For patients admitted to the hospital, discharge time will be determined by your treatment team.  Patients discharged the day of surgery will not be allowed to drive home.   Name and phone number of your driver:   family Special instructions:  None  Please read over the following fact sheets that you were given. Anesthesia Post-op Instructions and Care and Recovery After Surgery       Cystoscopy Cystoscopy is a procedure that is used to help diagnose and sometimes treat conditions that affect the lower urinary tract. The lower urinary tract includes the bladder and the urethra. The urethra is the tube that drains urine from the bladder. Cystoscopy is done using a thin, tube-shaped instrument with a light and camera at the end (cystoscope). The cystoscope may be hard or flexible, depending on the goal of the procedure. The cystoscope is inserted through the urethra, into the bladder. Cystoscopy may be recommended if you have:  Urinary tract infections that keep coming back.  Blood in the urine (hematuria).  An inability to  control when you urinate (urinary incontinence) or an overactive bladder.  Unusual cells found in a urine sample.  A blockage in the urethra, such as a urinary stone.  Painful urination.  An abnormality in the bladder found during an intravenous pyelogram (IVP) or CT scan. Cystoscopy may also be done to remove a sample of tissue to be examined under a microscope (biopsy). Tell a health care provider about:  Any allergies you have.  All medicines you are taking, including vitamins, herbs, eye drops, creams, and over-the-counter medicines.  Any problems you or family members have had with anesthetic medicines.  Any blood disorders you have.  Any surgeries you have had.  Any medical conditions you have.  Whether you are pregnant or may be pregnant. What are the risks? Generally, this is a safe procedure. However, problems may occur, including:  Infection.  Bleeding.  Allergic reactions to medicines.  Damage to other structures or organs. What happens before the procedure?  Ask your health care provider about: ? Changing or stopping your regular medicines. This is especially important if you are taking diabetes medicines or blood thinners. ? Taking medicines such as aspirin and ibuprofen. These medicines can thin your blood. Do not take these medicines unless your health care provider tells you to take them. ? Taking over-the-counter medicines, vitamins,  herbs, and supplements.  Follow instructions from your health care provider about eating or drinking restrictions.  Ask your health care provider what steps will be taken to help prevent infection. These may include: ? Washing skin with a germ-killing soap. ? Taking antibiotic medicine.  You may have an exam or testing, such as: ? X-rays of the bladder, urethra, or kidneys. ? Urine tests to check for signs of infection.  Plan to have someone take you home from the hospital or clinic. What happens during the procedure?    You will be given one or more of the following: ? A medicine to help you relax (sedative). ? A medicine to numb the area (local anesthetic).  The area around the opening of your urethra will be cleaned.  The cystoscope will be passed through your urethra into your bladder.  Germ-free (sterile) fluid will flow through the cystoscope to fill your bladder. The fluid will stretch your bladder so that your health care provider can clearly examine your bladder walls.  Your doctor will look at the urethra and bladder. Your doctor may take a biopsy or remove stones.  The cystoscope will be removed, and your bladder will be emptied. The procedure may vary among health care providers and hospitals. What can I expect after the procedure? After the procedure, it is common to have:  Some soreness or pain in your abdomen and urethra.  Urinary symptoms. These include: ? Mild pain or burning when you urinate. Pain should stop within a few minutes after you urinate. This may last for up to 1 week. ? A small amount of blood in your urine for several days. ? Feeling like you need to urinate but producing only a small amount of urine. Follow these instructions at home: Medicines  Take over-the-counter and prescription medicines only as told by your health care provider.  If you were prescribed an antibiotic medicine, take it as told by your health care provider. Do not stop taking the antibiotic even if you start to feel better. General instructions  Return to your normal activities as told by your health care provider. Ask your health care provider what activities are safe for you.  Do not drive for 24 hours if you were given a sedative during your procedure.  Watch for any blood in your urine. If the amount of blood in your urine increases, call your health care provider.  Follow instructions from your health care provider about eating or drinking restrictions.  If a tissue sample was  removed for testing (biopsy) during your procedure, it is up to you to get your test results. Ask your health care provider, or the department that is doing the test, when your results will be ready.  Drink enough fluid to keep your urine pale yellow.  Keep all follow-up visits as told by your health care provider. This is important. Contact a health care provider if you:  Have pain that gets worse or does not get better with medicine, especially pain when you urinate.  Have trouble urinating.  Have more blood in your urine. Get help right away if you:  Have blood clots in your urine.  Have abdominal pain.  Have a fever or chills.  Are unable to urinate. Summary  Cystoscopy is a procedure that is used to help diagnose and sometimes treat conditions that affect the lower urinary tract.  Cystoscopy is done using a thin, tube-shaped instrument with a light and camera at the end.  After the  procedure, it is common to have some soreness or pain in your abdomen and urethra.  Watch for any blood in your urine. If the amount of blood in your urine increases, call your health care provider.  If you were prescribed an antibiotic medicine, take it as told by your health care provider. Do not stop taking the antibiotic even if you start to feel better. This information is not intended to replace advice given to you by your health care provider. Make sure you discuss any questions you have with your health care provider. Document Released: 10/10/2000 Document Revised: 10/05/2018 Document Reviewed: 10/05/2018 Elsevier Patient Education  2020 [employer]. How to Use Chlorhexidine for Bathing Chlorhexidine gluconate (CHG) is a germ-killing (antiseptic) solution that is used to clean the skin. It can get rid of the bacteria that normally live on the skin and can keep them away for about 24 hours. To clean your skin with CHG, you may be given:  A CHG solution to use in the shower or as part of  a sponge bath.  A prepackaged cloth that contains CHG. Cleaning your skin with CHG may help lower the risk for infection:  While you are staying in the intensive care unit of the hospital.  If you have a vascular access, such as a central line, to provide short-term or long-term access to your veins.  If you have a catheter to drain urine from your bladder.  If you are on a ventilator. A ventilator is a machine that helps you breathe by moving air in and out of your lungs.  After surgery. What are the risks? Risks of using CHG include:  A skin reaction.  Hearing loss, if CHG gets in your ears.  Eye injury, if CHG gets in your eyes and is not rinsed out.  The CHG product catching fire. Make sure that you avoid smoking and flames after applying CHG to your skin. Do not use CHG:  If you have a chlorhexidine allergy or have previously reacted to chlorhexidine.  On babies younger than 89 months of age. How to use CHG solution  Use CHG only as told by your health care provider, and follow the instructions on the label.  Use the full amount of CHG as directed. Usually, this is one bottle. During a shower Follow these steps when using CHG solution during a shower (unless your health care provider gives you different instructions): 1. Start the shower. 2. Use your normal soap and shampoo to wash your face and hair. 3. Turn off the shower or move out of the shower stream. 4. Pour the CHG onto a clean washcloth. Do not use any type of brush or rough-edged sponge. 5. Starting at your neck, lather your body down to your toes. Make sure you follow these instructions: ? If you will be having surgery, pay special attention to the part of your body where you will be having surgery. Scrub this area for at least 1 minute. ? Do not use CHG on your head or face. If the solution gets into your ears or eyes, rinse them well with water. ? Avoid your genital area. ? Avoid any areas of skin that  have broken skin, cuts, or scrapes. ? Scrub your back and under your arms. Make sure to wash skin folds. 6. Let the lather sit on your skin for 1-2 minutes or as long as told by your health care provider. 7. Thoroughly rinse your entire body in the shower. Make sure  that all body creases and crevices are rinsed well. 8. Dry off with a clean towel. Do not put any substances on your body afterward-such as powder, lotion, or perfume-unless you are told to do so by your health care provider. Only use lotions that are recommended by the manufacturer. 9. Put on clean clothes or pajamas. 10. If it is the night before your surgery, sleep in clean sheets.  During a sponge bath Follow these steps when using CHG solution during a sponge bath (unless your health care provider gives you different instructions): 1. Use your normal soap and shampoo to wash your face and hair. 2. Pour the CHG onto a clean washcloth. 3. Starting at your neck, lather your body down to your toes. Make sure you follow these instructions: ? If you will be having surgery, pay special attention to the part of your body where you will be having surgery. Scrub this area for at least 1 minute. ? Do not use CHG on your head or face. If the solution gets into your ears or eyes, rinse them well with water. ? Avoid your genital area. ? Avoid any areas of skin that have broken skin, cuts, or scrapes. ? Scrub your back and under your arms. Make sure to wash skin folds. 4. Let the lather sit on your skin for 1-2 minutes or as long as told by your health care provider. 5. Using a different clean, wet washcloth, thoroughly rinse your entire body. Make sure that all body creases and crevices are rinsed well. 6. Dry off with a clean towel. Do not put any substances on your body afterward-such as powder, lotion, or perfume-unless you are told to do so by your health care provider. Only use lotions that are recommended by the manufacturer. 7. Put on  clean clothes or pajamas. 8. If it is the night before your surgery, sleep in clean sheets. How to use CHG prepackaged cloths  Only use CHG cloths as told by your health care provider, and follow the instructions on the label.  Use the CHG cloth on clean, dry skin.  Do not use the CHG cloth on your head or face unless your health care provider tells you to.  When washing with the CHG cloth: ? Avoid your genital area. ? Avoid any areas of skin that have broken skin, cuts, or scrapes. Before surgery Follow these steps when using a CHG cloth to clean before surgery (unless your health care provider gives you different instructions): 1. Using the CHG cloth, vigorously scrub the part of your body where you will be having surgery. Scrub using a back-and-forth motion for 3 minutes. The area on your body should be completely wet with CHG when you are done scrubbing. 2. Do not rinse. Discard the cloth and let the area air-dry. Do not put any substances on the area afterward, such as powder, lotion, or perfume. 3. Put on clean clothes or pajamas. 4. If it is the night before your surgery, sleep in clean sheets.  For general bathing Follow these steps when using CHG cloths for general bathing (unless your health care provider gives you different instructions). 1. Use a separate CHG cloth for each area of your body. Make sure you wash between any folds of skin and between your fingers and toes. Wash your body in the following order, switching to a new cloth after each step: ? The front of your neck, shoulders, and chest. ? Both of your arms, under your arms, and  your hands. ? Your stomach and groin area, avoiding the genitals. ? Your right leg and foot. ? Your left leg and foot. ? The back of your neck, your back, and your buttocks. 2. Do not rinse. Discard the cloth and let the area air-dry. Do not put any substances on your body afterward-such as powder, lotion, or perfume-unless you are told to  do so by your health care provider. Only use lotions that are recommended by the manufacturer. 3. Put on clean clothes or pajamas. Contact a health care provider if:  Your skin gets irritated after scrubbing.  You have questions about using your solution or cloth. Get help right away if:  Your eyes become very red or swollen.  Your eyes itch badly.  Your skin itches badly and is red or swollen.  Your hearing changes.  You have trouble seeing.  You have swelling or tingling in your mouth or throat.  You have trouble breathing.  You swallow any chlorhexidine. Summary  Chlorhexidine gluconate (CHG) is a germ-killing (antiseptic) solution that is used to clean the skin. Cleaning your skin with CHG may help to lower your risk for infection.  You may be given CHG to use for bathing. It may be in a bottle or in a prepackaged cloth to use on your skin. Carefully follow your health care provider's instructions and the instructions on the product label.  Do not use CHG if you have a chlorhexidine allergy.  Contact your health care provider if your skin gets irritated after scrubbing. This information is not intended to replace advice given to you by your health care provider. Make sure you discuss any questions you have with your health care provider. Document Released: 07/07/2012 Document Revised: 12/30/2018 Document Reviewed: 09/10/2017 Elsevier Patient Education  2020 [employer].

## 2019-08-15 ENCOUNTER — Other Ambulatory Visit: Payer: Self-pay

## 2019-08-15 ENCOUNTER — Encounter (HOSPITAL_COMMUNITY): Payer: Self-pay

## 2019-08-15 ENCOUNTER — Other Ambulatory Visit (HOSPITAL_COMMUNITY)
Admission: RE | Admit: 2019-08-15 | Discharge: 2019-08-15 | Disposition: A | Discharge status: Home / Self Care | Payer: Medicare Other | Source: Ambulatory Visit | Attending: Urology | Admitting: Urology

## 2019-08-15 ENCOUNTER — Encounter (HOSPITAL_COMMUNITY)
Admission: RE | Admit: 2019-08-15 | Discharge: 2019-08-15 | Disposition: A | Discharge status: Home / Self Care | Payer: Medicare Other | Source: Ambulatory Visit | Attending: Urology | Admitting: Urology

## 2019-08-15 DIAGNOSIS — E785 Hyperlipidemia, unspecified: Secondary | ICD-10-CM | POA: Insufficient documentation

## 2019-08-15 DIAGNOSIS — C7951 Secondary malignant neoplasm of bone: Secondary | ICD-10-CM | POA: Diagnosis not present

## 2019-08-15 DIAGNOSIS — N1330 Unspecified hydronephrosis: Secondary | ICD-10-CM | POA: Insufficient documentation

## 2019-08-15 DIAGNOSIS — K219 Gastro-esophageal reflux disease without esophagitis: Secondary | ICD-10-CM | POA: Insufficient documentation

## 2019-08-15 DIAGNOSIS — Z87891 Personal history of nicotine dependence: Secondary | ICD-10-CM | POA: Insufficient documentation

## 2019-08-15 DIAGNOSIS — Z01818 Encounter for other preprocedural examination: Secondary | ICD-10-CM | POA: Insufficient documentation

## 2019-08-15 DIAGNOSIS — I10 Essential (primary) hypertension: Secondary | ICD-10-CM | POA: Diagnosis not present

## 2019-08-15 DIAGNOSIS — C61 Malignant neoplasm of prostate: Secondary | ICD-10-CM | POA: Diagnosis not present

## 2019-08-15 DIAGNOSIS — G4730 Sleep apnea, unspecified: Secondary | ICD-10-CM | POA: Diagnosis not present

## 2019-08-15 HISTORY — DX: Unspecified hearing loss, unspecified ear: H91.90

## 2019-08-15 LAB — SARS CORONAVIRUS 2 (TAT 6-24 HRS): SARS Coronavirus 2: NEGATIVE

## 2019-08-15 NOTE — Pre-Procedure Instructions (Signed)
Dr Hilaria Ota aware of EKG. No orders given.

## 2019-08-15 NOTE — Patient Instructions (Addendum)
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  We are going to treat you with chemotherapy with palliative intent.  This means that your cancer is treatable but not curable.  You will receive Docetaxel (Taxotere) through your port-a-cath once every 21 days.  The medication will take 60 minutes to infuse.  You will also be given a medication Pegfilgrastim Ellen Henri, Fulphila, etc.) 2 days after treatment to help increase your white blood cell (WBC) production.  You will see the doctor regularly throughout treatment.  We monitor your lab work prior to every treatment. The doctor monitors your response to treatment by the way you are feeling, your blood work, and scans periodically.  There will be wait times while you are here for treatment.  It will take about 30 minutes to 1 hour for your lab work to result.  Then there will be wait times while pharmacy mixes your medications.  Docetaxel (Taxotere)  About This Drug Docetaxel is used to treat cancer. It is given in the vein (IV).  Possible Side Effects  Bone marrow suppression. This is a decrease in the number of white blood cells, red blood cells, and platelets. This may raise your risk of infection, make you tired and weak (fatigue), and raise your risk of bleeding.  Fever in the setting of decreased white blood cells, which is a serious condition that can be life threatening  Soreness of the mouth and throat. You may have red areas, white patches, or sores that hurt.  Nausea and vomiting (throwing up)  Constipation (not able to move bowels)  Diarrhea (loose bowel movements)  Infections  Swelling of your legs, ankles, and/or feet  Changes in the way food and drinks taste  Effects on the nerves are called peripheral neuropathy. You may feel numbness, tingling, or pain in your hands and feet. It may be hard for you to button your clothes, open jars, or walk as usual.  The effect on the nerves may get worse with more doses of the drug. These effects get better in some people after the drug is stopped but it does not get better in all people.  Decreased appetite (decreased hunger)  Weakness  Pain  Muscle pain/aching  Trouble breathing  Changes in your nail color, you may have nail loss and/or brittle nail  Hair loss. Hair loss is often temporary, although there have been cases of permanent hair loss reported. Hair loss may happen suddenly or gradually. If you lose hair, you may lose it from your head, face, armpits, pubic area, chest, and/or legs. You may also notice your hair getting thin.  Allergic skin reaction. You may develop blisters on your skin that are filled with fluid or a severe red rash all over your body that may be painful.  Allergic reactions, including anaphylaxis are rare but may happen in some patients. Signs of allergic reaction to this drug may be swelling of the face, feeling like your tongue or throat are swelling, trouble breathing, rash, itching, fever, chills, feeling dizzy, and/or feeling that your heart is beating in a fast or not normal way. If this happens, do not take another dose of this drug. You should get urgent medical treatment.  Note: Not all possible side effects are included above.  Warnings and Precautions  Severe bone marrow suppression  Severe allergic reactions, including anaphylaxis which can be life-threatening.  Swelling (inflammation) in the colon in the setting of severely low white blood  cells, which raises your risk of infection and can be life-threatening  Severe swelling in the eye or other changes in eyesight  Severe swelling of your legs, ankles and/or feet. Sometimes, fluid can build up in your lungs and/or around your heart causing you trouble breathing.  If you have a history of abnormal liver function, receive high doses of docetaxel, or have a history of lung cancer and have  received treatment with a platinum (type of chemotherapy medication), you have an increased risk of death  Severe weakness  This drug may raise your risk of getting a second cancer such as leukemia and myelodysplastic syndrome  Severe peripheral neuropathy and/or numbness, tingling or a sensation of pins and needles in your arms, hands, legs or feet  This drug contains alcohol and may affect your central nervous system. The central nervous system is made up of your brain and spinal cord. You may feel drunk during and after your treatment and it can impair your ability to drive or use machinery for one to two hours after infusion.  Note: Some of the side effects above are very rare. If you have concerns and/or questions, please discuss them with your medical team.  Important Information  This drug may be present in the saliva, tears, sweat, urine, stool, vomit, semen, and vaginal secretions. Talk to your doctor and/or your nurse about the necessary precautions to take during this time.  Treating Side Effects  Manage tiredness by pacing your activities for the day.  Be sure to include periods of rest between energy-draining activities.  Get regular exercise. If you feel too tired to exercise vigorously, try taking a short walk.  To decrease the risk of infection, wash your hands regularly.  Avoid close contact with people who have a cold, the flu, or other infections.  Take your temperature as your doctor or nurse tells you, and whenever you feel like you may have a fever.  To help decrease bleeding, use a soft toothbrush. Check with your nurse before using dental floss.  Be very careful when using knives or tools.  Use an electric shaver instead of a razor.  Mouth care is very important. Your mouth care should consist of routine, gentle cleaning of your teeth or dentures and rinsing your mouth with a mixture of 1/2 teaspoon of salt in 8 ounces of water or 1/2 teaspoon of  baking soda in 8 ounces of water. This should be done at least after each meal and at bedtime.  If you have mouth sores, avoid mouthwash that has alcohol. Also avoid alcohol and smoking because they can bother your mouth and throat.  Ask your doctor or nurse about medicines that are available to help stop or lessen constipation and/ or diarrhea.  If you are not able to move your bowels, check with your doctor or nurse before you use enemas, laxatives, or suppositories.  Drink plenty of fluids (a minimum of eight glasses per day is recommended).  If you throw up or have loose bowel movements, you should drink more fluids so that you do not become dehydrated (lack of water in the body from losing too much fluid).  If you get diarrhea, eat low-fiber foods that are high in protein and calories and avoid foods that can irritate your digestive tracts or lead to cramping.  To help with nausea and vomiting, eat small, frequent meals instead of three large meals a day. Choose foods and drinks that are at room temperature. Ask your nurse  or doctor about other helpful tips and medicine that is available to help or stop lessen these symptoms.  To help with decreased appetite, eat foods high in calories and protein, such as meat, poultry, fish, dry beans, tofu, eggs, nuts, milk, yogurt, cheese, ice cream, pudding, and nutritional supplements.  Consider using sauces and spices to increase taste. Daily exercise, with your doctor's approval, may increase your appetite.  Keeping your pain under control is important to your well-being. Please tell your doctor or nurse if you are experiencing pain.  If you get a rash do not put anything on it unless your doctor or nurse says you may. Keep the area around the rash clean and dry. Ask your doctor for medicine if your rash bothers you.  Keeping your nails moisturized may help with brittleness.  To help with hair loss, wash with a mild shampoo and  avoid washing your hair every day.  Avoid rubbing your scalp, pat your hair or scalp dry.  Avoid coloring your hair.  Limit your use of hair spray, electric curlers, blow dryers, and curling irons.  If you are interested in getting a wig, talk to your nurse. You can also call the [employer] at [phone] to find out information about the Look Good, Feel Better program close to where you live. It is a free program where women getting chemotherapy can learn about wigs, turbans and scarves as well as makeup techniques and skin and nail care.  If you have numbness and tingling in your hands and feet, be careful when cooking, walking, and handling sharp objects and hot liquids.  Food and Drug Interactions  There are no known interactions of docetaxel with food.  This drug may interact with other medicines. Tell your doctor and pharmacist about all the prescription and over-the-counter medicines and dietary supplements (vitamins, minerals, herbs and others) that you are taking at this time. Also, check with your doctor or pharmacist before starting any new prescription or over-the-counter medicines, or dietary supplements to make sure that there are no interactions.  When to Call the Doctor Call your doctor or nurse if you have any of these symptoms and/or any new or unusual symptoms:  Fever of 100.4 F (38 C) or higher  Chills  Blurred vision or other changes in eyesight  Easy bruising or bleeding  Wheezing or trouble breathing  Feeling dizzy or lightheaded  Fatigue that interferes with your daily activities  Pain in your mouth or throat that makes it hard to eat or drink  Nausea that stops you from eating or drinking and/or is not relieved by prescribed medicines  Throwing up more than 3 times a day  Lasting loss of appetite or rapid weight loss of five pounds in a week  Diarrhea, 4 times in one day or diarrhea with lack of strength or a feeling of  being dizzy  No bowel movement in 3 days or when you feel uncomfortable  Severe abdominal pain that does not go away  Numbness, tingling, or pain your hands and feet  Swelling of legs, ankles, or feet  Weight gain of 5 pounds in one week (fluid retention)  Extreme weakness that interferes with normal activities  New rash and/or itching  Rash that is not relieved by prescribed medicines  Signs of allergic reaction: swelling of the face, feeling like your tongue or throat are swelling, trouble breathing, rash, itching, fever, chills, feeling dizzy, and/or feeling that your heart is beating in a fast or  not normal way  Signs of possible liver problems: dark urine, pale bowel movements, bad stomach pain, feeling very tired and weak, unusual itching, or yellowing of the eyes or skin  Symptoms of being drunk, confusion, or being very sleepy  If you think you may be pregnant  Reproduction Warnings  Pregnancy warning: This drug can have harmful effects on the unborn baby. Women of childbearing potential should use effective methods of birth control during your cancer treatment. Let your doctor know right away if you think you may be pregnant.  Breastfeeding warning: It is not known if this drug passes into breast milk. For this reason, women should talk to their doctor about the risks and benefits of breastfeeding during treatment with this drug because this drug may enter the breast milk and cause harm to a breastfeeding baby.  Fertility warning: Human fertility studies have not been done with this drug. Talk with your doctor or nurse if you plan to have children. Ask for information on sperm or egg banking.   Pegfilgrastim-xxxx (Neulasta, Neulasta Onpro, Fulphila, Udenyca)  About This Drug  Pegfilgrastim-xxxx belongs to a class of medicines called granulocyte colony-stimulating factor (G-CSF). G-CSF helps the body make more white blood cells. White blood cells help  fight infection in your body. This drug is given as an injection under the skin (subcutaneously).  Possible Side Effects  Bone pain  Pain in your arms and/or legs  Note: Each of the side effects above was reported in 5% or greater of patients treated with pegfilgrastim-xxxx. Not all possible side effects are included above.  Warnings and Precautions  Enlargement and inflammation (swelling) of your spleen, which can very rarely rupture and be lifethreatening. Signs of enlargement may be left-sided pain in your abdomen and/or shoulder.  Trouble breathing because of fluid build-up around your lungs and/or inflammation (swelling) of the lungs.  Allergic reactions, including anaphylaxis are rare but may happen in some patients. Signs of allergic reaction to this drug may be swelling of the face, feeling like your tongue or throat are swelling, trouble breathing, rash, itching, fever, chills, feeling dizzy, and/or feeling that your heart is beating in a fast or not normal way. If this happens, do not take another dose of this drug. You should get urgent medical treatment.  Sickle cell crisis in sickle cell patients treated with pegfilgrastim-xxxx  Changes in your kidney function  A rapid increase in your white blood cells may happen  A syndrome where fluid and protein can leak from your blood vessels into your tissues. This can cause a decrease in your blood protein level and blood pressure and fluid can accumulate in your tissues and/or lungs.  The on-body injector uses acrylic adhesive. Caution should be used if you have an allergy to acrylic adhesives.  Missed or partial doses of the medication have been reported from the on-body injector device not working properly. This could increase your risk of developing severely low white blood cells, which puts you at a risk of severe and life-threatening infections. If you have any problems with the onbody injector, contact your doctor  and/or nurse right away.  Inflammation of the aorta- symptoms may include fever, abdominal pain, back pain and feeling tired.  Note: Some of the side effects above are very rare. If you have concerns and/or questions, please discuss them with your medical team.  Important Information  If you have the on-body injector (OBI) placed, avoid activities such as traveling, driving, or operating heavy machinery during  hours 26-29 after it is placed.  How to Take Your Medication  Talk to your doctor, nurse and/or pharmacist for proper preparation, dosing, administration. It is important that you follow the instructions and precautions closely.  Treating Side Effects  Keeping your pain under control is important to your well-being. Please tell your doctor or nurse if you are experiencing pain.  Food and Drug Interactions  There are no known interactions of pegfilgrastim-xxxx with food.  Tell your doctor and pharmacist about all the prescription and over-the-counter medicines and dietary supplements (vitamins, minerals, herbs and others) that you are taking at this time. Also, check with your doctor or pharmacist before starting any new prescription or over-the-counter medicines, or dietary supplements to make sure that there are no interactions.  When to Call the Doctor Call your doctor or nurse if you have any of these symptoms and/or any new or unusual symptoms:  Fever of 100.4 F (38 C) or higher  Chills  Cough  Wheezing and/or trouble breathing  Feeling dizzy or lightheaded  Tiredness that interferes with your daily activities  Pain in the left side of the abdomen and/or shoulder pain  Back pain  Decreased urine, or very dark urine  Pain that does not go away or is not relieved by prescribed medicine  Swelling of legs, ankles, or feet  Signs of allergic reaction: swelling of the face, feeling like your tongue or throat are swelling, trouble breathing, rash,  itching, fever, chills, feeling dizzy, and/or feeling that your heart is beating in a fast or not normal way. If this happens, call 911 for emergency care.  if you think you may be pregnant  Reproduction Warnings  Pregnancy warning: It is not known if this drug may harm an unborn child. For this reason, be sure to talk with your doctor if you are pregnant or planning to become pregnant while receiving this drug. Let your doctor know right away if you think you may be pregnant.  Breastfeeding warning: It is not known if this drug passes into breast milk. For this reason, women should talk to their doctor about the risks and benefits of breastfeeding during treatment with this drug because this drug may enter the breast milk and cause harm to a breastfeeding baby.  Fertility warning: Human fertility studies have not been done with this drug. Talk with your doctor or nurse if you plan to have children. Ask for information on sperm or egg banking.   SELF CARE ACTIVITIES WHILE RECEIVING CHEMOTHERAPY:  Hydration Increase your fluid intake 48 hours prior to treatment and drink at least 8 to 12 cups (64 ounces) of water/decaffeinated beverages per day after treatment. You can still have your cup of coffee or soda but these beverages do not count as part of your 8 to 12 cups that you need to drink daily. No alcohol intake.  Medications Continue taking your normal prescription medication as prescribed.  If you start any new herbal or new supplements please let us know first to make sure it is safe.  Mouth Care Have teeth cleaned professionally before starting treatment. Keep dentures and partial plates clean. Use soft toothbrush and do not use mouthwashes that contain alcohol. Biotene is a good mouthwash that is available at most pharmacies or may be ordered by calling [phone]. Use warm salt water gargles (1 teaspoon salt per 1 quart warm water) before and after meals and at bedtime. If you  need dental work, please let the doctor know before  you go for your appointment so that we can coordinate the best possible time for you in regards to your chemo regimen. You need to also let your dentist know that you are actively taking chemo. We may need to do labs prior to your dental appointment.  Skin Care Always use sunscreen that has not expired and with SPF (Sun Protection Factor) of 50 or higher. Wear hats to protect your head from the sun. Remember to use sunscreen on your hands, ears, face, & feet.  Use good moisturizing lotions such as udder cream, eucerin, or even Vaseline. Some chemotherapies can cause dry skin, color changes in your skin and nails.     Avoid long, hot showers or baths.  Use gentle, fragrance-free soaps and laundry detergent.  Use moisturizers, preferably creams or ointments rather than lotions because the thicker consistency is better at preventing skin dehydration. Apply the cream or ointment within 15 minutes of showering. Reapply moisturizer at night, and moisturize your hands every time after you wash them.  Hair Loss (if your doctor says your hair will fall out)   If your doctor says that your hair is likely to fall out, decide before you begin chemo whether you want to wear a wig. You may want to shop before treatment to match your hair color.  Hats, turbans, and scarves can also camouflage hair loss, although some people prefer to leave their heads uncovered. If you go bare-headed outdoors, be sure to use sunscreen on your scalp.  Cut your hair short. It eases the inconvenience of shedding lots of hair, but it also can reduce the emotional impact of watching your hair fall out.  Don't perm or color your hair during chemotherapy. Those chemical treatments are already damaging to hair and can enhance hair loss. Once your chemo treatments are done and your hair has grown back, it's OK to resume dyeing or perming hair.  With chemotherapy, hair loss is almost  always temporary. But when it grows back, it may be a different color or texture. In older adults who still had hair color before chemotherapy, the new growth may be completely gray.  Often, new hair is very fine and soft.  Infection Prevention Please wash your hands for at least 30 seconds using warm soapy water. Handwashing is the #1 way to prevent the spread of germs. Stay away from sick people or people who are getting over a cold. If you develop respiratory systems such as green/yellow mucus production or productive cough or persistent cough let us know and we will see if you need an antibiotic. It is a good idea to keep a pair of gloves on when going into grocery stores/[employer] to decrease your risk of coming into contact with germs on the carts, etc. Carry alcohol hand gel with you at all times and use it frequently if out in public. If your temperature reaches 100.5 or higher please call the clinic and let us know.  If it is after hours or on the weekend please go to the ER if your temperature is over 100.5.  Please have your own personal thermometer at home to use.    Sex and bodily fluids If you are going to have sex, a condom must be used to protect the person that isn't taking chemotherapy. Chemo can decrease your libido (sex drive). For a few days after chemotherapy, chemotherapy can be excreted through your bodily fluids.  When using the toilet please close the lid and flush the toilet  twice.  Do this for a few day after you have had chemotherapy.   Effects of chemotherapy on your sex life Some changes are simple and won't last long. They won't affect your sex life permanently.  Sometimes you may feel:  too tired  not strong enough to be very active  sick or sore   not in the mood  anxious or low Your anxiety might not seem related to sex. For example, you may be worried about the cancer and how your treatment is going. Or you may be worried about money, or about how you family  are coping with your illness. These things can cause stress, which can affect your interest in sex. It's important to talk to your partner about how you feel. Remember - the changes to your sex life don't usually last long. There's usually no medical reason to stop having sex during chemo. The drugs won't have any long term physical effects on your performance or enjoyment of sex. Cancer can't be passed on to your partner during sex  Contraception It's important to use reliable contraception during treatment. Avoid getting pregnant while you or your partner are having chemotherapy. This is because the drugs may harm the baby. Sometimes chemotherapy drugs can leave a man or woman infertile.  This means you would not be able to have children in the future. You might want to talk to someone about permanent infertility. It can be very difficult to learn that you may no longer be able to have children. Some people find counselling helpful. There might be ways to preserve your fertility, although this is easier for men than for women. You may want to speak to a fertility expert. You can talk about sperm banking or harvesting your eggs. You can also ask about other fertility options, such as donor eggs. If you have or have had breast cancer, your doctor might advise you not to take the contraceptive pill. This is because the hormones in it might affect the cancer.  It is not known for sure whether or not chemotherapy drugs can be passed on through semen or secretions from the vagina. Because of this some doctors advise people to use a barrier method if you have sex during treatment. This applies to vaginal, anal or oral sex. Generally, doctors advise a barrier method only for the time you are actually having the treatment and for about a week after your treatment. Advice like this can be worrying, but this does not mean that you have to avoid being intimate with your partner. You can still have close contact with  your partner and continue to enjoy sex.  Animals If you have cats or birds we just ask that you not change the litter or change the cage.  Please have someone else do this for you while you are on chemotherapy.   Food Safety During and After Cancer Treatment Food safety is important for people both during and after cancer treatment. Cancer and cancer treatments, such as chemotherapy, radiation therapy, and stem cell/bone marrow transplantation, often weaken the immune system. This makes it harder for your body to protect itself from foodborne illness, also called food poisoning. Foodborne illness is caused by eating food that contains harmful bacteria, parasites, or viruses.  Foods to avoid Some foods have a higher risk of becoming tainted with bacteria. These include:  Unwashed fresh fruit and vegetables, especially leafy vegetables that can hide dirt and other contaminants  Raw sprouts, such as alfalfa sprouts  Raw or undercooked beef, especially ground beef, or other raw or undercooked meat and poultry  Fatty, fried, or spicy foods immediately before or after treatment.  These can sit heavy on your stomach and make you feel nauseous.  Raw or undercooked shellfish, such as oysters.  Sushi and sashimi, which often contain raw fish.   Unpasteurized beverages, such as unpasteurized fruit juices, raw milk, raw yogurt, or cider  Undercooked eggs, such as soft boiled, over easy, and poached; raw, unpasteurized eggs; or foods made with raw egg, such as homemade raw cookie dough and homemade mayonnaise  Simple steps for food safety  Shop smart.  Do not buy food stored or displayed in an unclean area.  Do not buy bruised or damaged fruits or vegetables.  Do not buy cans that have cracks, dents, or bulges.  Pick up foods that can spoil at the end of your shopping trip and store them in a cooler on the way home.  Prepare and clean up foods carefully.  Rinse all fresh fruits and  vegetables under running water, and dry them with a clean towel or paper towel.  Clean the top of cans before opening them.  After preparing food, wash your hands for 20 seconds with hot water and soap. Pay special attention to areas between fingers and under nails.  Clean your utensils and dishes with hot water and soap.  Disinfect your kitchen and cutting boards using 1 teaspoon of liquid, unscented bleach mixed into 1 quart of water.    Dispose of old food.  Eat canned and packaged food before its expiration date (the use by or best before date).  Consume refrigerated leftovers within 3 to 4 days. After that time, throw out the food. Even if the food does not smell or look spoiled, it still may be unsafe. Some bacteria, such as Listeria, can grow even on foods stored in the refrigerator if they are kept for too long.  Take precautions when eating out.  At restaurants, avoid buffets and salad bars where food sits out for a long time and comes in contact with many people. Food can become contaminated when someone with a virus, often a norovirus, or another bug handles it.  Put any leftover food in a to-go container yourself, rather than having the server do it. And, refrigerate leftovers as soon as you get home.  Choose restaurants that are clean and that are willing to prepare your food as you order it cooked.   AT HOME MEDICATIONS:                                                                                                                                                                Compazine/Prochlorperazine 10mg  tablet. Take 1 tablet every 6 hours as  needed for nausea/vomiting. (This can make you sleepy)   EMLA cream. Apply a quarter size amount to port site 1 hour prior to chemo. Do not rub in. Cover with plastic wrap.    Diarrhea Sheet   If you are having loose stools/diarrhea, please purchase Imodium and begin taking as outlined:  At the first sign of poorly  formed or loose stools you should begin taking Imodium (loperamide) 2 mg capsules.  Take two tablets (4mg ) followed by one tablet (2mg ) every 2 hours - DO NOT EXCEED 8 tablets in 24 hours.  If it is bedtime and you are having loose stools, take 2 tablets at bedtime, then 2 tablets every 4 hours until morning.   Always call the [facility] if you are having loose stools/diarrhea that you can't get under control.  Loose stools/diarrhea leads to dehydration (loss of water) in your body.  We have other options of trying to get the loose stools/diarrhea to stop but you must let us know!   Constipation Sheet  Colace - 100 mg capsules - take 2 capsules daily.  If this doesn't help then you can increase to 2 capsules twice daily.  Please call if the above does not work for you. Do not go more than 2 days without a bowel movement.  It is very important that you do not become constipated.  It will make you feel sick to your stomach (nausea) and can cause abdominal pain and vomiting.  Nausea Sheet   Compazine/Prochlorperazine 10mg  tablet. Take 1 tablet every 6 hours as needed for nausea/vomiting (This can make you drowsy).  If you are having persistent nausea (nausea that does not stop) please call the [facility] and let us know the amount of nausea that you are experiencing.  If you begin to vomit, you need to call the [facility] and if it is the weekend and you have vomited more than one time and can't get it to stop-go to the Emergency Room.  Persistent nausea/vomiting can lead to dehydration (loss of fluid in your body) and will make you feel very weak and unwell. Ice chips, sips of clear liquids, foods that are at room temperature, crackers, and toast tend to be better tolerated.   SYMPTOMS TO REPORT AS SOON AS POSSIBLE AFTER TREATMENT:  FEVER GREATER THAN 100.5 F  CHILLS WITH OR WITHOUT FEVER  NAUSEA AND VOMITING THAT IS NOT CONTROLLED WITH YOUR NAUSEA MEDICATION  UNUSUAL SHORTNESS OF  BREATH  UNUSUAL BRUISING OR BLEEDING  TENDERNESS IN MOUTH AND THROAT WITH OR WITHOUT   PRESENCE OF ULCERS  URINARY PROBLEMS  BOWEL PROBLEMS  UNUSUAL RASH    Wear comfortable clothing and clothing appropriate for easy access to any Portacath or PICC line. Let us know if there is anything that we can do to make your therapy better!   What to do if you need assistance after hours or on the weekends: CALL [phone].  HOLD on the line, do not hang up.  You will hear multiple messages but at the end you will be connected with a nurse triage line.  They will contact the doctor if necessary.  Most of the time they will be able to assist you.  Do not call the hospital operator.    I have been informed and understand all of the instructions given to me and have received a copy. I have been instructed to call the clinic [phone] or my family physician as soon as possible for continued medical  care, if indicated. I do not have any more questions at this time but understand that I may call the [facility] or the Patient Navigator at [phone] during office hours should I have questions or need assistance in obtaining follow-up care.

## 2019-08-16 ENCOUNTER — Inpatient Hospital Stay (HOSPITAL_COMMUNITY): Payer: Medicare Other

## 2019-08-16 DIAGNOSIS — Z95828 Presence of other vascular implants and grafts: Secondary | ICD-10-CM

## 2019-08-16 DIAGNOSIS — C7951 Secondary malignant neoplasm of bone: Secondary | ICD-10-CM

## 2019-08-16 DIAGNOSIS — C61 Malignant neoplasm of prostate: Secondary | ICD-10-CM

## 2019-08-16 MED ORDER — PROCHLORPERAZINE MALEATE 10 MG PO TABS: 10.0000 mg | ORAL_TABLET | Freq: Four times a day (QID) | ORAL | 0 refills | Status: DC | PRN

## 2019-08-16 MED ORDER — LIDOCAINE-PRILOCAINE 2.5-2.5 % EX CREA: TOPICAL_CREAM | CUTANEOUS | 0 refills | Status: AC

## 2019-08-16 NOTE — Progress Notes (Signed)

## 2019-08-17 ENCOUNTER — Other Ambulatory Visit: Payer: Self-pay

## 2019-08-17 ENCOUNTER — Encounter (HOSPITAL_COMMUNITY): Payer: Self-pay | Admitting: *Deleted

## 2019-08-17 ENCOUNTER — Ambulatory Visit (HOSPITAL_COMMUNITY): Payer: Medicare Other | Admitting: Anesthesiology

## 2019-08-17 ENCOUNTER — Ambulatory Visit (HOSPITAL_COMMUNITY)
Admission: RE | Admit: 2019-08-17 | Discharge: 2019-08-17 | Disposition: A | Discharge status: Home / Self Care | Payer: Medicare Other | Attending: Urology | Admitting: Urology

## 2019-08-17 ENCOUNTER — Ambulatory Visit (HOSPITAL_COMMUNITY): Payer: Medicare Other

## 2019-08-17 ENCOUNTER — Encounter (HOSPITAL_COMMUNITY)
Admission: RE | Disposition: A | Discharge status: Home / Self Care | Payer: Self-pay | Source: Home / Self Care | Attending: Urology

## 2019-08-17 DIAGNOSIS — N1330 Unspecified hydronephrosis: Secondary | ICD-10-CM

## 2019-08-17 DIAGNOSIS — Z8546 Personal history of malignant neoplasm of prostate: Secondary | ICD-10-CM | POA: Insufficient documentation

## 2019-08-17 DIAGNOSIS — N131 Hydronephrosis with ureteral stricture, not elsewhere classified: Secondary | ICD-10-CM | POA: Diagnosis not present

## 2019-08-17 DIAGNOSIS — R590 Localized enlarged lymph nodes: Secondary | ICD-10-CM | POA: Diagnosis not present

## 2019-08-17 DIAGNOSIS — M1990 Unspecified osteoarthritis, unspecified site: Secondary | ICD-10-CM | POA: Insufficient documentation

## 2019-08-17 DIAGNOSIS — Z8249 Family history of ischemic heart disease and other diseases of the circulatory system: Secondary | ICD-10-CM | POA: Diagnosis not present

## 2019-08-17 DIAGNOSIS — N135 Crossing vessel and stricture of ureter without hydronephrosis: Secondary | ICD-10-CM | POA: Insufficient documentation

## 2019-08-17 DIAGNOSIS — Z9079 Acquired absence of other genital organ(s): Secondary | ICD-10-CM | POA: Diagnosis not present

## 2019-08-17 DIAGNOSIS — Z8711 Personal history of peptic ulcer disease: Secondary | ICD-10-CM | POA: Insufficient documentation

## 2019-08-17 DIAGNOSIS — G4730 Sleep apnea, unspecified: Secondary | ICD-10-CM | POA: Diagnosis not present

## 2019-08-17 DIAGNOSIS — I10 Essential (primary) hypertension: Secondary | ICD-10-CM | POA: Insufficient documentation

## 2019-08-17 DIAGNOSIS — Z87891 Personal history of nicotine dependence: Secondary | ICD-10-CM | POA: Diagnosis not present

## 2019-08-17 DIAGNOSIS — C7951 Secondary malignant neoplasm of bone: Secondary | ICD-10-CM | POA: Diagnosis not present

## 2019-08-17 HISTORY — PX: CYSTOSCOPY W/ URETERAL STENT PLACEMENT: SHX1429

## 2019-08-17 SURGERY — CYSTOSCOPY, WITH RETROGRADE PYELOGRAM AND URETERAL STENT INSERTION
Anesthesia: General | Laterality: Left

## 2019-08-17 MED ORDER — PROPOFOL 10 MG/ML IV BOLUS
INTRAVENOUS | Status: AC
  Filled 2019-08-17: qty 20

## 2019-08-17 MED ORDER — TRAMADOL HCL 50 MG PO TABS: 50.0000 mg | ORAL_TABLET | Freq: Four times a day (QID) | ORAL | 0 refills | Status: AC | PRN

## 2019-08-17 MED ORDER — STERILE WATER FOR IRRIGATION IR SOLN
Status: DC | PRN
  Administered 2019-08-17: 500 mL

## 2019-08-17 MED ORDER — SUCCINYLCHOLINE CHLORIDE 20 MG/ML IJ SOLN
INTRAMUSCULAR | Status: DC | PRN
  Administered 2019-08-17: 120 mg via INTRAVENOUS

## 2019-08-17 MED ORDER — LACTATED RINGERS IV SOLN
INTRAVENOUS | Status: DC | PRN
  Administered 2019-08-17: 12:00:00 via INTRAVENOUS

## 2019-08-17 MED ORDER — DIATRIZOATE MEGLUMINE 30 % UR SOLN
URETHRAL | Status: AC
  Filled 2019-08-17: qty 100

## 2019-08-17 MED ORDER — HYDROCODONE-ACETAMINOPHEN 7.5-325 MG PO TABS: 1.0000 | ORAL_TABLET | Freq: Once | ORAL | Status: DC | PRN

## 2019-08-17 MED ORDER — LIDOCAINE HCL (CARDIAC) PF 100 MG/5ML IV SOSY
PREFILLED_SYRINGE | INTRAVENOUS | Status: DC | PRN
  Administered 2019-08-17: 60 mg via INTRAVENOUS

## 2019-08-17 MED ORDER — PROMETHAZINE HCL 25 MG/ML IJ SOLN: 6.2500 mg | INTRAMUSCULAR | Status: DC | PRN

## 2019-08-17 MED ORDER — FENTANYL CITRATE (PF) 100 MCG/2ML IJ SOLN
INTRAMUSCULAR | Status: AC
  Filled 2019-08-17: qty 2

## 2019-08-17 MED ORDER — HYDROMORPHONE HCL 1 MG/ML IJ SOLN: 0.2500 mg | INTRAMUSCULAR | Status: DC | PRN

## 2019-08-17 MED ORDER — LIDOCAINE 2% (20 MG/ML) 5 ML SYRINGE
INTRAMUSCULAR | Status: AC
  Filled 2019-08-17: qty 5

## 2019-08-17 MED ORDER — SUGAMMADEX SODIUM 500 MG/5ML IV SOLN
INTRAVENOUS | Status: AC
  Filled 2019-08-17: qty 5

## 2019-08-17 MED ORDER — ROCURONIUM BROMIDE 10 MG/ML (PF) SYRINGE
PREFILLED_SYRINGE | INTRAVENOUS | Status: AC
  Filled 2019-08-17: qty 10

## 2019-08-17 MED ORDER — DEXTROSE 5 % IV SOLN
3.0000 g | INTRAVENOUS | Status: AC
  Administered 2019-08-17: 12:00:00 3 g via INTRAVENOUS
  Filled 2019-08-17: qty 3000

## 2019-08-17 MED ORDER — EPHEDRINE 5 MG/ML INJ
INTRAVENOUS | Status: AC
  Filled 2019-08-17: qty 10

## 2019-08-17 MED ORDER — MIDAZOLAM HCL 2 MG/2ML IJ SOLN: 0.5000 mg | Freq: Once | INTRAMUSCULAR | Status: DC | PRN

## 2019-08-17 MED ORDER — DIATRIZOATE MEGLUMINE 30 % UR SOLN
URETHRAL | Status: DC | PRN
  Administered 2019-08-17: 13:00:00 18 mL via URETHRAL

## 2019-08-17 MED ORDER — FENTANYL CITRATE (PF) 100 MCG/2ML IJ SOLN
INTRAMUSCULAR | Status: DC | PRN
  Administered 2019-08-17: 50 ug via INTRAVENOUS

## 2019-08-17 MED ORDER — SODIUM CHLORIDE 0.9 % IR SOLN
Status: DC | PRN
  Administered 2019-08-17: 3000 mL

## 2019-08-17 MED ORDER — CEFAZOLIN SODIUM-DEXTROSE 2-4 GM/100ML-% IV SOLN
INTRAVENOUS | Status: AC
  Filled 2019-08-17: qty 100

## 2019-08-17 MED ORDER — PROPOFOL 10 MG/ML IV BOLUS
INTRAVENOUS | Status: DC | PRN
  Administered 2019-08-17: 170 mg via INTRAVENOUS

## 2019-08-17 MED ORDER — CEFAZOLIN SODIUM-DEXTROSE 1-4 GM/50ML-% IV SOLN
INTRAVENOUS | Status: AC
  Filled 2019-08-17: qty 50

## 2019-08-17 MED ORDER — SUGAMMADEX SODIUM 200 MG/2ML IV SOLN
INTRAVENOUS | Status: DC | PRN
  Administered 2019-08-17: 251.8 mg via INTRAVENOUS

## 2019-08-17 SURGICAL SUPPLY — 22 items
BAG DRAIN URO TABLE W/ADPT NS (BAG) ×2 IMPLANT
BAG DRN 8 ADPR NS SKTRN CSTL (BAG) ×1
CATH INTERMIT  6FR 70CM (CATHETERS) ×2 IMPLANT
CLOTH BEACON ORANGE TIMEOUT ST (SAFETY) ×2 IMPLANT
DECANTER SPIKE VIAL GLASS SM (MISCELLANEOUS) ×2 IMPLANT
GLOVE BIO SURGEON STRL SZ8 (GLOVE) ×2 IMPLANT
GLOVE BIOGEL PI IND STRL 7.0 (GLOVE) ×2 IMPLANT
GLOVE BIOGEL PI INDICATOR 7.0 (GLOVE) ×3
GLOVE ECLIPSE 6.5 STRL STRAW (GLOVE) ×1 IMPLANT
GOWN STRL REUS W/TWL LRG LVL3 (GOWN DISPOSABLE) ×3 IMPLANT
GOWN STRL REUS W/TWL XL LVL3 (GOWN DISPOSABLE) ×2 IMPLANT
GUIDEWIRE STR DUAL SENSOR (WIRE) ×1 IMPLANT
GUIDEWIRE STR ZIPWIRE 035X150 (MISCELLANEOUS) ×2 IMPLANT
IV NS IRRIG 3000ML ARTHROMATIC (IV SOLUTION) ×2 IMPLANT
KIT TURNOVER CYSTO (KITS) ×2 IMPLANT
MANIFOLD NEPTUNE II (INSTRUMENTS) ×2 IMPLANT
PACK CYSTO (CUSTOM PROCEDURE TRAY) ×2 IMPLANT
PAD ARMBOARD 7.5X6 YLW CONV (MISCELLANEOUS) ×2 IMPLANT
STENT CONTOUR 7FRX24 (STENTS) ×1 IMPLANT
SYR 10ML LL (SYRINGE) ×2 IMPLANT
TOWEL OR 17X26 4PK STRL BLUE (TOWEL DISPOSABLE) ×2 IMPLANT
WATER STERILE IRR 500ML POUR (IV SOLUTION) ×2 IMPLANT

## 2019-08-17 NOTE — Anesthesia Preprocedure Evaluation (Signed)
Anesthesia Evaluation  Patient identified by MRN, date of birth, ID band Patient awake and Patient confused  General Assessment Comment:Not a great historian -drove himself here Ride home obtained /verified  Reviewed: Allergy & Precautions, NPO status , Patient's Chart, lab work & pertinent test results  Airway Mallampati: II  TM Distance: >3 FB Neck ROM: Full    Dental no notable dental hx. (+) Edentulous Upper, Edentulous Lower   Pulmonary sleep apnea and Continuous Positive Airway Pressure Ventilation , former smoker,    Pulmonary exam normal breath sounds clear to auscultation       Cardiovascular Exercise Tolerance: Poor hypertension, Pt. on medications negative cardio ROS Normal cardiovascular examII Rhythm:Regular Rate:Normal  Last ECG -NSR, LAFB,RBB EF 2018 normal  holter 2018 -ess normal -on short non sustained Vtach episode    Neuro/Psych negative neurological ROS  negative psych ROS   GI/Hepatic Neg liver ROS, PUD, GERD  Medicated and Controlled,  Endo/Other  Morbid obesity  Renal/GU negative Renal ROS  negative genitourinary   Musculoskeletal  (+) Arthritis , Osteoarthritis,    Abdominal   Peds negative pediatric ROS (+)  Hematology negative hematology ROS (+)   Anesthesia Other Findings   Reproductive/Obstetrics negative OB ROS                             Anesthesia Physical Anesthesia Plan  ASA: IV  Anesthesia Plan: General   Post-op Pain Management:    Induction: Intravenous  PONV Risk Score and Plan: 2 and Ondansetron, Treatment may vary due to age or medical condition and Dexamethasone  Airway Management Planned: Oral ETT  Additional Equipment:   Intra-op Plan:   Post-operative Plan: Extubation in OR  Informed Consent: I have reviewed the patients History and Physical, chart, labs and discussed the procedure including the risks, benefits and alternatives  for the proposed anesthesia with the patient or authorized representative who has indicated his/her understanding and acceptance.     Dental advisory given  Plan Discussed with: CRNA  Anesthesia Plan Comments: (Plan Full PPE use Plan GETA D/W PT -WTP with same after Q&A)        Anesthesia Quick Evaluation

## 2019-08-17 NOTE — Addendum Note (Signed)
Addendum  created 08/17/19 1644 by Ollen Bowl, CRNA   Charge Capture section accepted

## 2019-08-17 NOTE — Discharge Instructions (Signed)

## 2019-08-17 NOTE — H&P (Signed)
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 His creatinine has been trending upward. He denies any flank pain. No  hematuria  Past Medical History:  Diagnosis Date  . Cancer [facility])    prostate  . Family history of breast cancer   . Family history of prostate cancer   . GERD (gastroesophageal reflux disease)   . HOH (hard of hearing)   . Hyperlipidemia   . Hypertension   . 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 Sleep apnea 10/24/2016   Past Surgical History:  Procedure Laterality Date  . CATARACT EXTRACTION W/PHACO Right 04/22/2019   Procedure: CATARACT EXTRACTION PHACO AND INTRAOCULAR LENS PLACEMENT RIGHT EYE;  Surgeon: Baruch Goldmann, MD;  Location: [facility];  Service: Ophthalmology;  Laterality: Right;  right  . CATARACT EXTRACTION W/PHACO Left 05/13/2019   Procedure: CATARACT EXTRACTION PHACO AND INTRAOCULAR LENS PLACEMENT LEFT EYE;  Surgeon: Baruch Goldmann, MD;  Location: [facility];  Service: Ophthalmology;  Laterality: Left;  left  . CHOLECYSTECTOMY    . HAND SURGERY    . INGUINAL HERNIA REPAIR Left   . PROSTATECTOMY    . PROSTATECTOMY    . radical prostatectomy    . TONSILLECTOMY      Home Medications:  Current Facility-Administered Medications  Medication Dose Route Frequency Provider Last Rate Last Dose  . ceFAZolin (ANCEF) 3 g in dextrose 5 % 50 mL IVPB  3 g Intravenous 30 min Pre-Op Roselyne Stalnaker, Candee Furbish, MD       Allergies: No Known Allergies  Family History  Problem Relation Age of Onset  . Heart failure Mother        CHF  . Cancer Mother        unknown  . Prostate cancer Father   . Narcolepsy Sister   . Cancer Daughter        unknown  . Breast cancer Maternal Aunt   . Cancer Maternal Grandmother        unknown  . Cancer Maternal Aunt        unknown type of cancer   Social History:  reports that he quit  smoking about 30 years ago. His smoking use included cigarettes. He has a 32.00 pack-year smoking history. He has never used smokeless tobacco. He reports that he does not drink alcohol or use drugs.  Review of Systems  All other systems reviewed and are negative.   Physical Exam:  Vital signs in last 24 hours: Pulse Rate:  [66] 66 (10/21 1202) Resp:  [16] 16 (10/21 1202) BP: (169)/(85) 169/85 (10/21 1202) SpO2:  [98 %] 98 % (10/21 1202) Physical Exam  Constitutional: He is oriented to person, place, and time. He appears well-developed and well-nourished.  HENT:  Head: Normocephalic and atraumatic.  Eyes: Pupils are equal, round, and reactive to light. EOM are normal.  Neck: Normal range of motion. No thyromegaly present.  Cardiovascular: Normal rate and regular rhythm.  Respiratory: Effort normal. No respiratory distress.  GI: Soft. He exhibits no distension.  Musculoskeletal: Normal range of motion.        General: No edema.  Neurological: He is alert and oriented to person, place, and time.  Skin: Skin is warm and dry.  Psychiatric: He has a normal mood and affect. His behavior is normal. Judgment and thought content normal.    Laboratory Data:  No results found for this  or any previous visit (from the past 24 hour(s)). Recent Results (from the past 240 hour(s))  SARS CORONAVIRUS 2 (TAT 6-24 HRS) Nasopharyngeal Nasopharyngeal Swab     Status: None   Collection Time: 08/15/19  7:44 AM   Specimen: Nasopharyngeal Swab  Result Value Ref Range Status   SARS Coronavirus 2 NEGATIVE NEGATIVE Final    Comment: (NOTE) SARS-CoV-2 target nucleic acids are NOT DETECTED. The SARS-CoV-2 RNA is generally detectable in upper and lower respiratory specimens during the acute phase of infection. Negative results do not preclude SARS-CoV-2 infection, do not rule out co-infections with other pathogens, and should not be used as the sole basis for treatment or other patient management  decisions. Negative results must be combined with clinical observations, patient history, and epidemiological information. The expected result is Negative. Fact Sheet for Patients: SugarRoll.be Fact Sheet for Healthcare Providers: https://www.woods-mathews.com/ This test is not yet approved or cleared by the [place] FDA and  has been authorized for detection and/or diagnosis of SARS-CoV-2 by FDA under an Emergency Use Authorization (EUA). This EUA will remain  in effect (meaning this test can be used) for the duration of the COVID-19 declaration under Section 56 4(b)(1) of the Act, 21 U.S.C. section 360bbb-3(b)(1), unless the authorization is terminated or revoked sooner. Performed at [facility], [place] [address] [place], [place] [address]    Creatinine: Recent Labs    08/11/19 0825  CREATININE 1.86*   Baseline Creatinine: 1.2  Impression/Assessment:  78yo with left malignant hydronephrosis  Plan:  The risks/benefits/alternatives to left ureteral stent placement was explained to the patient understands and wishes to proceed with surgery  Nicolette Bang 08/17/2019, 12:14 PM

## 2019-08-17 NOTE — Anesthesia Procedure Notes (Signed)
Procedure Name: Intubation Date/Time: 08/17/2019 12:34 PM Performed by: Hewitt Blade, CRNA Pre-anesthesia Checklist: Patient identified, Emergency Drugs available, Suction available and Patient being monitored Patient Re-evaluated:Patient Re-evaluated prior to induction Oxygen Delivery Method: Circle system utilized Preoxygenation: Pre-oxygenation with 100% oxygen Induction Type: IV induction Laryngoscope Size: Mac and 4 Grade View: Grade I Tube type: Oral Tube size: 7.5 mm Number of attempts: 1 Airway Equipment and Method: Stylet Placement Confirmation: ETT inserted through vocal cords under direct vision,  positive ETCO2 and breath sounds checked- equal and bilateral Secured at: 22 cm Tube secured with: Tape Dental Injury: Teeth and Oropharynx as per pre-operative assessment

## 2019-08-17 NOTE — Transfer of Care (Signed)
Immediate Anesthesia Transfer of Care Note  Patient: [name]  Procedure(s) Performed: CYSTOSCOPY WITH RETROGRADE PYELOGRAM/URETERAL STENT PLACEMENT (Left )  Patient Location: PACU  Anesthesia Type:General  Level of Consciousness: awake, alert  and oriented  Airway & Oxygen Therapy: Patient Spontanous Breathing and Patient connected to face mask oxygen  Post-op Assessment: Report given to RN, Post -op Vital signs reviewed and stable and Patient moving all extremities  Post vital signs: Reviewed and stable  Last Vitals:  Vitals Value Taken Time  BP 158/86 08/17/19 1320  Temp    Pulse 77 08/17/19 1324  Resp 10 08/17/19 1324  SpO2 100 % 08/17/19 1324  Vitals shown include unvalidated device data.  Last Pain:  Vitals:   08/17/19 1202  TempSrc: Oral  PainSc: 0-No pain         Complications: No apparent anesthesia complications

## 2019-08-17 NOTE — Anesthesia Postprocedure Evaluation (Signed)
Anesthesia Post Note  Patient: [name]  Procedure(s) Performed: CYSTOSCOPY WITH RETROGRADE PYELOGRAM/URETERAL STENT PLACEMENT (Left )  Patient location during evaluation: PACU Anesthesia Type: General Level of consciousness: awake and alert Pain management: pain level controlled Vital Signs Assessment: post-procedure vital signs reviewed and stable Respiratory status: spontaneous breathing, nonlabored ventilation and respiratory function stable Cardiovascular status: stable Postop Assessment: no backache, no apparent nausea or vomiting and patient able to bend at knees Anesthetic complications: no     Last Vitals:  Vitals:   08/17/19 1202 08/17/19 1320  BP: (!) 169/85 (!) 158/86  Pulse: 66 (P) 78  Resp: 16 (P) 14  Temp:  (P) 36.5 C  SpO2: 98% (P) 99%    Last Pain:  Vitals:   08/17/19 1202  TempSrc: Oral  PainSc: 0-No pain                 Blanchie Zeleznik Hristova

## 2019-08-17 NOTE — Op Note (Signed)
.  Preoperative diagnosis: Left malignant ureteral obstruction  Postoperative diagnosis: Same  Procedure: 1 cystoscopy 2. Left retrograde pyelography 3.  Intraoperative fluoroscopy, under one hour, with interpretation 4. Left 7 x 28 JJ stent placement  Attending: Nicolette Bang  Anesthesia: General  Estimated blood loss: None  Drains: Left 7 x 28 JJ ureteral stent without tether  Specimens: urine for culture  Antibiotics: ancef  Findings: left mid ureteral obstruction with severe hydronephrosis. No masses/lesions in the bladder. Ureteral orifices in normal anatomic location.  Indications: Patient is a 78 year old male with a history of metastatic prostate cancer and left ureteral obstruction from pelvic lymphadenopathy.  After discussing treatment options, they decided proceed with left stent placement.  Procedure her in detail: The patient was brought to the operating room and a brief timeout was done to ensure correct patient, correct procedure, correct site.  General anesthesia was administered patient was placed in dorsal lithotomy position.  Their genitalia was then prepped and draped in usual sterile fashion.  A rigid 31 French cystoscope was passed in the urethra and the bladder.  Bladder was inspected free masses or lesions.  the ureteral orifices were in the normal orthotopic locations.  a 6 french ureteral catheter was then instilled into the left ureteral orifice.  a gentle retrograde was obtained and findings noted above.  we then placed a zip wire through the ureteral catheter and advanced up to the renal pelvis.    We then placed a 7 x 28 double-j ureteral stent over the original zip wire.  We then removed the wire and good coil was noted in the the renal pelvis under fluoroscopy and the bladder under direct vision.  A foley catheter was then placed. the bladder was then drained and this concluded the procedure which was well tolerated by patient.  Complications:  None  Condition: Stable, extubated, transferred to PACU  Plan: Patient is to be discharged home and followup in 1 month with a renal US

## 2019-08-18 ENCOUNTER — Encounter (HOSPITAL_COMMUNITY): Payer: Self-pay | Admitting: Urology

## 2019-08-23 ENCOUNTER — Ambulatory Visit (INDEPENDENT_AMBULATORY_CARE_PROVIDER_SITE_OTHER): Payer: Medicare Other | Admitting: General Surgery

## 2019-08-23 ENCOUNTER — Encounter: Payer: Self-pay | Admitting: General Surgery

## 2019-08-23 ENCOUNTER — Inpatient Hospital Stay (HOSPITAL_COMMUNITY): Payer: Medicare Other | Admitting: General Practice

## 2019-08-23 ENCOUNTER — Other Ambulatory Visit: Payer: Self-pay

## 2019-08-23 ENCOUNTER — Encounter (HOSPITAL_COMMUNITY): Payer: Self-pay | Admitting: General Practice

## 2019-08-23 VITALS — BP 136/86 | HR 79 | Temp 98.2°F | Resp 18 | Ht 69.5 in | Wt 272.0 lb

## 2019-08-23 DIAGNOSIS — C61 Malignant neoplasm of prostate: Secondary | ICD-10-CM

## 2019-08-23 DIAGNOSIS — C7951 Secondary malignant neoplasm of bone: Secondary | ICD-10-CM | POA: Diagnosis not present

## 2019-08-23 NOTE — Progress Notes (Signed)
[facility] History and Physical  Reason for Referral: Port a catheter placement for metastatic prostate cancer  Referring Physician:  Dr. Delton Coombes   Chief Complaint    Vascular Access Problem      KYANDRE CHLUDZINSKI is a 78 y.o. male.  HPI: Mr. Barrilleaux is a 78 yo with reported metastatic prostate cancer who has been seen by Dr. Delton Coombes and has plans to undergo further palliative treatment.  He is hard of hearing but reports no major issues recently, and says that he is planning on undergoing the therapy.  He is worried about losing his hair and being weak, and I discussed with him that these were questions he would need to discuss with Dr. Delton Coombes. Per the Oncology team, he is going to start treatment with a PIV in the upcoming week.   Past Medical History:  Diagnosis Date  . Cancer [facility])    prostate  . Family history of breast cancer   . Family history of prostate cancer   . GERD (gastroesophageal reflux disease)   . HOH (hard of hearing)   . Hyperlipidemia   . Hypertension   . 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 Sleep apnea 10/24/2016    Past Surgical History:  Procedure Laterality Date  . CATARACT EXTRACTION W/PHACO Right 04/22/2019   Procedure: CATARACT EXTRACTION PHACO AND INTRAOCULAR LENS PLACEMENT RIGHT EYE;  Surgeon: Baruch Goldmann, MD;  Location: [facility];  Service: Ophthalmology;  Laterality: Right;  right  . CATARACT EXTRACTION W/PHACO Left 05/13/2019   Procedure: CATARACT EXTRACTION PHACO AND INTRAOCULAR LENS PLACEMENT LEFT EYE;  Surgeon: Baruch Goldmann, MD;  Location: [facility];  Service: Ophthalmology;  Laterality: Left;  left  . CHOLECYSTECTOMY    . CYSTOSCOPY W/ URETERAL STENT PLACEMENT Left 08/17/2019   Procedure: CYSTOSCOPY WITH RETROGRADE PYELOGRAM/URETERAL STENT PLACEMENT;  Surgeon: Cleon Gustin, MD;  Location: [facility];  Service: Urology;  Laterality: Left;  . HAND SURGERY    . INGUINAL HERNIA REPAIR Left   .  PROSTATECTOMY    . PROSTATECTOMY    . radical prostatectomy    . TONSILLECTOMY      Family History  Problem Relation Age of Onset  . Heart failure Mother        CHF  . Cancer Mother        unknown  . Prostate cancer Father   . Narcolepsy Sister   . Cancer Daughter        unknown  . Breast cancer Maternal Aunt   . Cancer Maternal Grandmother        unknown  . Cancer Maternal Aunt        unknown type of cancer    Social History   Tobacco Use  . Smoking status: Former Smoker    Packs/day: 1.00    Years: 32.00    Pack years: 32.00    Types: Cigarettes    Quit date: 02/04/1989    Years since quitting: 30.5  . Smokeless tobacco: Never Used  Substance Use Topics  . Alcohol use: No  . Drug use: No    Medications: I have reviewed the patient's current medications. Allergies as of 08/23/2019   No Known Allergies     Medication List       Accurate as of August 23, 2019 12:14 PM. If you have any questions, ask your nurse or doctor.        benazepril 20 MG tablet Commonly known as: LOTENSIN TAKE ONE TABLET BY MOUTH  DAILY.   calcium-vitamin D 500-200 MG-UNIT Tabs tablet Commonly known as: OSCAL WITH D Take 1 tablet by mouth daily.   cetirizine 10 MG tablet Commonly known as: ZYRTEC TAKE ONE TABLET BY MOUTH DAILY.   clotrimazole-betamethasone cream Commonly known as: Lotrisone Apply 1 application topically 2 (two) times daily.   DOCETAXEL IV Inject into the vein every 21 ( twenty-one) days. Start taking on: August 26, 2019   fexofenadine 180 MG tablet Commonly known as: ALLEGRA Take 180 mg by mouth daily.   fluticasone 50 MCG/ACT nasal spray Commonly known as: FLONASE USE ONE SPRAY IN EACH NOSTRIL ONCE DAILY. SHAKE GENTLY BEFORE USING.   furosemide 40 MG tablet Commonly known as: LASIX TAKE ONE TABLET BY MOUTH DAILY. TAKE WITH POTASSIUM.   hydrochlorothiazide 25 MG tablet Commonly known as: HYDRODIURIL TAKE ONE TABLET BY MOUTH DAILY.   Ilevro  0.3 % ophthalmic suspension Generic drug: nepafenac INSTILL ONE DROP IN THE OPERATIVE EYE DAILY. START TWO DAYS BEFORE SURGERY AND CONTINUE AS DIRECTED.   Iron 325 (65 Fe) MG Tabs Take 1 tablet by mouth daily.   ketoconazole 2 % cream Commonly known as: NIZORAL APPLY A SMALL AMOUNT TO FACE RASH TWICE DAILY FOR UP TO FOUR WEEKS IF NEEDED.   lidocaine-prilocaine cream Commonly known as: EMLA Apply small amount to port a cath site and cover with plastic wrap 1 hour prior to chemotherapy appointments   losartan 100 MG tablet Commonly known as: COZAAR Take 100 mg by mouth daily.   meloxicam 7.5 MG tablet Commonly known as: MOBIC Take 1 tablet (7.5 mg total) daily by mouth.   methylphenidate 20 MG tablet Commonly known as: RITALIN Take 20 mg by mouth daily.   mirabegron ER 25 MG Tb24 tablet Commonly known as: MYRBETRIQ Take by mouth.   moxifloxacin 0.5 % ophthalmic solution Commonly known as: VIGAMOX INSTILL ONE DROP THREE TIMES DAILY IN THE OPERATIVE EYE. START TWO DAYS BEFORE SURGERY AND CONTINUE AS DIRECTED.   potassium chloride SA 20 MEQ tablet Commonly known as: KLOR-CON Take 20 mEq by mouth daily.   prednisoLONE acetate 1 % ophthalmic suspension Commonly known as: PRED FORTE INSTILL ONE DROP IN THE OPERATIVE EYE THREE TIMES DAILY. START ONE DAY AFTER SURGERY AND CONTINUE AS DIRECTED.   prochlorperazine 10 MG tablet Commonly known as: COMPAZINE Take 1 tablet (10 mg total) by mouth every 6 (six) hours as needed for nausea or vomiting.   selenium sulfide 2.5 % shampoo Commonly known as: SELSUN Apply 1 application topically daily as needed for irritation.   traMADol 50 MG tablet Commonly known as: ULTRAM TAKE ONE TABLET BY MOUTH EVERY 6 HOURS AS NEEDED FOR PAIN   traMADol 50 MG tablet Commonly known as: Ultram Take 1 tablet (50 mg total) by mouth every 6 (six) hours as needed.        ROS:  A comprehensive review of systems was negative except for:  Constitutional: positive for anorexia and decreased energy Musculoskeletal: positive for left shoulder pain  Blood pressure 136/86, pulse 79, temperature 98.2 F (36.8 C), temperature source Oral, resp. rate 18, height 5' 9.5" (1.765 m), weight 272 lb (123.4 kg), SpO2 93 %. Physical Exam Vitals signs reviewed.  Constitutional:      Appearance: Normal appearance.  HENT:     Head: Normocephalic.     Ears:     Comments: Decreased hearing    Nose: Nose normal.     Mouth/Throat:     Mouth: Mucous membranes are moist.  Eyes:  Pupils: Pupils are equal, round, and reactive to light.  Cardiovascular:     Rate and Rhythm: Normal rate and regular rhythm.  Pulmonary:     Effort: Pulmonary effort is normal.     Breath sounds: Normal breath sounds.  Abdominal:     General: There is no distension.     Palpations: Abdomen is soft.     Tenderness: There is no abdominal tenderness.  Musculoskeletal:        General: No swelling.  Skin:    General: Skin is warm and dry.  Neurological:     General: No focal deficit present.     Mental Status: He is alert and oriented to person, place, and time.  Psychiatric:        Mood and Affect: Mood normal.        Behavior: Behavior normal.        Thought Content: Thought content normal.        Judgment: Judgment normal.     Results: None  Assessment & Plan:  MYSEAN NAPPO is a 78 y.o. male with metastatic prostate cancer who is going to undergo treatment. He says he wants to proceed with treatment.  We will get his port in as soon as possible, and per report he will be starting his first therapy in the upcoming week.   -Discussed risk of bleeding, infection, pneumothorax, malfunction, injury to vessel. He expressed understanding and wants to proceed.   All questions were answered to the satisfaction of the patient.    Virl Cagey 08/23/2019, 12:15 PM

## 2019-08-23 NOTE — Progress Notes (Signed)
[facility] CSW Progress Notes  Call to patient to complete initial social work assessment.  No answer, no VM set up.  Will reschedule patient to see during infusion on 11/3.  Edwyna Shell, LCSW Clinical Social Worker Phone:  [phone] Cell:  [phone]

## 2019-08-23 NOTE — Patient Instructions (Signed)
Implanted Port Insertion Implanted port insertion is a procedure to put in a port and catheter. The port is a device with an injectable disk that can be accessed by your health care provider. The port is connected to a vein in the chest or neck by a small flexible tube (catheter). There are different types of ports. The implanted port may be used as a long-term IV access for:  Medicines, such as chemotherapy.  Fluids.  Liquid nutrition, such as total parenteral nutrition (TPN). When you have a port, this means that your health care provider will not need to use the veins in your arms for these procedures. Tell a health care provider about:  Any allergies you have.  All medicines you are taking, especially blood thinners, as well as any vitamins, herbs, eye drops, creams, over-the-counter medicines, and steroids.  Any problems you or family members have had with anesthetic medicines.  Any blood disorders you have.  Any surgeries you have had.  Any medical conditions you have or have had, including diabetes or kidney problems.  Whether you are pregnant or may be pregnant. What are the risks? Generally, this is a safe procedure. However, problems may occur, including:  Allergic reactions to medicines or dyes.  Damage to other structures or organs.  Infection.  Damage to the blood vessel, bruising, or bleeding at the puncture site.  Blood clot.  Breakdown of the skin over the port.  A collection of air in the chest that can cause one of the lungs to collapse (pneumothorax). This is rare. What happens before the procedure? Medicines  Ask your health care provider about: ? Changing or stopping your regular medicines. This is especially important if you are taking diabetes medicines or blood thinners. ? Taking medicines such as aspirin and ibuprofen. These medicines can thin your blood. Do not take these medicines unless your health care provider tells you to take them. ?  Taking over-the-counter medicines, vitamins, herbs, and supplements. General instructions  Plan to have someone take you home from the hospital or clinic.  If you will be going home right after the procedure, plan to have someone with you for 24 hours.  You may have blood tests.  Do not use any products that contain nicotine or tobacco for at least 4-6 weeks before the procedure. These products include cigarettes, e-cigarettes, and chewing tobacco. If you need help quitting, ask your health care provider.  Ask your health care provider what steps will be taken to help prevent infection. These may include: ? Removing hair at the surgery site. ? Washing skin with a germ-killing soap. ? Taking antibiotic medicine. What happens during the procedure?   An IV will be inserted into one of your veins.  You will be given one or more of the following: ? A medicine to help you relax (sedative). ? A medicine to numb the area (local anesthetic).  Two small incisions will be made to insert the port. ? One smaller incision will be made in your neck to get access to the vein where the catheter will lie. ? The other incision will be made in the upper chest. This is where the port will lie.  The procedure may be done using continuous X-ray (fluoroscopy) or other imaging tools for guidance.  The port and catheter will be placed. There may be a small, raised area where the port is.  The port will be flushed with a salt solution (saline), and blood will be drawn to make   sure that it is working correctly.  The incisions will be closed.  Bandages (dressings) may be placed over the incisions. The procedure may vary among health care providers and hospitals. What happens after the procedure?  Your blood pressure, heart rate, breathing rate, and blood oxygen level will be monitored until you leave the hospital or clinic.  Do not drive for 24 hours if you were given a sedative during your procedure.   You will be given a manufacturer's information card for the type of port that you have. Keep this with you.  Your port will need to be flushed and checked as told by your health care provider, usually every few weeks.  A chest X-ray will be done to: ? Check the placement of the port. ? Make sure there is no injury to your lung. Summary  Implanted port insertion is a procedure to put in a port and catheter.  The implanted port is used as a long-term IV access.  The port will need to be flushed and checked as told by your health care provider, usually every few weeks.  Keep your manufacturer's information card with you at all times. This information is not intended to replace advice given to you by your health care provider. Make sure you discuss any questions you have with your health care provider. Document Released: 08/03/2013 Document Revised: 02/04/2019 Document Reviewed: 05/11/2018 Elsevier Patient Education  [place].

## 2019-08-25 ENCOUNTER — Other Ambulatory Visit: Payer: Self-pay

## 2019-08-25 ENCOUNTER — Other Ambulatory Visit (HOSPITAL_COMMUNITY): Payer: Self-pay | Admitting: Hematology

## 2019-08-25 ENCOUNTER — Emergency Department (HOSPITAL_COMMUNITY)
Admission: EM | Admit: 2019-08-25 | Discharge: 2019-08-25 | Discharge status: Against Medical Advice | Payer: Medicare Other

## 2019-08-25 ENCOUNTER — Other Ambulatory Visit (HOSPITAL_COMMUNITY): Payer: Self-pay

## 2019-08-25 DIAGNOSIS — C7951 Secondary malignant neoplasm of bone: Secondary | ICD-10-CM

## 2019-08-25 DIAGNOSIS — C61 Malignant neoplasm of prostate: Secondary | ICD-10-CM

## 2019-08-25 DIAGNOSIS — Z7189 Other specified counseling: Secondary | ICD-10-CM | POA: Insufficient documentation

## 2019-08-25 NOTE — ED Notes (Signed)
Pt called X 2 no answer. 

## 2019-08-25 NOTE — Progress Notes (Signed)
START ON PATHWAY REGIMEN - Prostate     A cycle is every 21 days:     Docetaxel      Prednisone   **Always confirm dose/schedule in your pharmacy ordering system**  Patient Characteristics: Adenocarcinoma, Distant Metastases, Castration Resistant, Symptomatic, Docetaxel Eligible Histology: Adenocarcinoma Therapeutic Status: Distant Metastases  Intent of Therapy: Non-Curative / Palliative Intent, Discussed with Patient 

## 2019-08-25 NOTE — H&P (Signed)
[facility] History and Physical  Reason for Referral: Port a catheter placement for metastatic prostate cancer  Referring Physician:  Dr. Delton Coombes      Chief Complaint    Vascular Access Problem      LUSTER Finley is a 78 y.o. male.  HPI: [name] is a 78 yo with reported metastatic prostate cancer who has been seen by Dr. Delton Coombes and has plans to undergo further palliative treatment.  He is hard of hearing but reports no major issues recently, and says that he is planning on undergoing the therapy.  He is worried about losing his hair and being weak, and I discussed with him that these were questions he would need to discuss with Dr. Delton Coombes. Per the Oncology team, he is going to start treatment with a PIV in the upcoming week.       Past Medical History:  Diagnosis Date  . Cancer [facility])    prostate  . Family history of breast cancer   . Family history of prostate cancer   . GERD (gastroesophageal reflux disease)   . HOH (hard of hearing)   . Hyperlipidemia   . Hypertension   . 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 Sleep apnea 10/24/2016         Past Surgical History:  Procedure Laterality Date  . CATARACT EXTRACTION W/PHACO Right 04/22/2019   Procedure: CATARACT EXTRACTION PHACO AND INTRAOCULAR LENS PLACEMENT RIGHT EYE;  Surgeon: Baruch Goldmann, MD;  Location: [facility];  Service: Ophthalmology;  Laterality: Right;  right  . CATARACT EXTRACTION W/PHACO Left 05/13/2019   Procedure: CATARACT EXTRACTION PHACO AND INTRAOCULAR LENS PLACEMENT LEFT EYE;  Surgeon: Baruch Goldmann, MD;  Location: [facility];  Service: Ophthalmology;  Laterality: Left;  left  . CHOLECYSTECTOMY    . CYSTOSCOPY W/ URETERAL STENT PLACEMENT Left 08/17/2019   Procedure: CYSTOSCOPY WITH RETROGRADE PYELOGRAM/URETERAL STENT PLACEMENT;  Surgeon: Cleon Gustin, MD;  Location: [facility];  Service: Urology;  Laterality: Left;  . HAND SURGERY    .  INGUINAL HERNIA REPAIR Left   . PROSTATECTOMY    . PROSTATECTOMY    . radical prostatectomy    . TONSILLECTOMY           Family History  Problem Relation Age of Onset  . Heart failure Mother        CHF  . Cancer Mother        unknown  . Prostate cancer Father   . Narcolepsy Sister   . Cancer Daughter        unknown  . Breast cancer Maternal Aunt   . Cancer Maternal Grandmother        unknown  . Cancer Maternal Aunt        unknown type of cancer    Social History        Tobacco Use  . Smoking status: Former Smoker    Packs/day: 1.00    Years: 32.00    Pack years: 32.00    Types: Cigarettes    Quit date: 02/04/1989    Years since quitting: 30.5  . Smokeless tobacco: Never Used  Substance Use Topics  . Alcohol use: No  . Drug use: No    Medications: I have reviewed the patient's current medications. Allergies as of 08/23/2019   No Known Allergies        Medication List       Accurate as of August 23, 2019 12:14 PM. If you have any questions,  ask your nurse or doctor.        benazepril 20 MG tablet Commonly known as: LOTENSIN TAKE ONE TABLET BY MOUTH DAILY.   calcium-vitamin D 500-200 MG-UNIT Tabs tablet Commonly known as: OSCAL WITH D Take 1 tablet by mouth daily.   cetirizine 10 MG tablet Commonly known as: ZYRTEC TAKE ONE TABLET BY MOUTH DAILY.   clotrimazole-betamethasone cream Commonly known as: Lotrisone Apply 1 application topically 2 (two) times daily.   DOCETAXEL IV Inject into the vein every 21 ( twenty-one) days. Start taking on: August 26, 2019   fexofenadine 180 MG tablet Commonly known as: ALLEGRA Take 180 mg by mouth daily.   fluticasone 50 MCG/ACT nasal spray Commonly known as: FLONASE USE ONE SPRAY IN EACH NOSTRIL ONCE DAILY. SHAKE GENTLY BEFORE USING.   furosemide 40 MG tablet Commonly known as: LASIX TAKE ONE TABLET BY MOUTH DAILY. TAKE WITH POTASSIUM.    hydrochlorothiazide 25 MG tablet Commonly known as: HYDRODIURIL TAKE ONE TABLET BY MOUTH DAILY.   Ilevro 0.3 % ophthalmic suspension Generic drug: nepafenac INSTILL ONE DROP IN THE OPERATIVE EYE DAILY. START TWO DAYS BEFORE SURGERY AND CONTINUE AS DIRECTED.   Iron 325 (65 Fe) MG Tabs Take 1 tablet by mouth daily.   ketoconazole 2 % cream Commonly known as: NIZORAL APPLY A SMALL AMOUNT TO FACE RASH TWICE DAILY FOR UP TO FOUR WEEKS IF NEEDED.   lidocaine-prilocaine cream Commonly known as: EMLA Apply small amount to port a cath site and cover with plastic wrap 1 hour prior to chemotherapy appointments   losartan 100 MG tablet Commonly known as: COZAAR Take 100 mg by mouth daily.   meloxicam 7.5 MG tablet Commonly known as: MOBIC Take 1 tablet (7.5 mg total) daily by mouth.   methylphenidate 20 MG tablet Commonly known as: RITALIN Take 20 mg by mouth daily.   mirabegron ER 25 MG Tb24 tablet Commonly known as: MYRBETRIQ Take by mouth.   moxifloxacin 0.5 % ophthalmic solution Commonly known as: VIGAMOX INSTILL ONE DROP THREE TIMES DAILY IN THE OPERATIVE EYE. START TWO DAYS BEFORE SURGERY AND CONTINUE AS DIRECTED.   potassium chloride SA 20 MEQ tablet Commonly known as: KLOR-CON Take 20 mEq by mouth daily.   prednisoLONE acetate 1 % ophthalmic suspension Commonly known as: PRED FORTE INSTILL ONE DROP IN THE OPERATIVE EYE THREE TIMES DAILY. START ONE DAY AFTER SURGERY AND CONTINUE AS DIRECTED.   prochlorperazine 10 MG tablet Commonly known as: COMPAZINE Take 1 tablet (10 mg total) by mouth every 6 (six) hours as needed for nausea or vomiting.   selenium sulfide 2.5 % shampoo Commonly known as: SELSUN Apply 1 application topically daily as needed for irritation.   traMADol 50 MG tablet Commonly known as: ULTRAM TAKE ONE TABLET BY MOUTH EVERY 6 HOURS AS NEEDED FOR PAIN   traMADol 50 MG tablet Commonly known as: Ultram Take 1 tablet (50 mg total) by  mouth every 6 (six) hours as needed.        ROS:  A comprehensive review of systems was negative except for: Constitutional: positive for anorexia and decreased energy Musculoskeletal: positive for left shoulder pain  Blood pressure 136/86, pulse 79, temperature 98.2 F (36.8 C), temperature source Oral, resp. rate 18, height 5' 9.5" (1.765 m), weight 272 lb (123.4 kg), SpO2 93 %. Physical Exam Vitals signs reviewed.  Constitutional:      Appearance: Normal appearance.  HENT:     Head: Normocephalic.     Ears:  Comments: Decreased hearing    Nose: Nose normal.     Mouth/Throat:     Mouth: Mucous membranes are moist.  Eyes:     Pupils: Pupils are equal, round, and reactive to light.  Cardiovascular:     Rate and Rhythm: Normal rate and regular rhythm.  Pulmonary:     Effort: Pulmonary effort is normal.     Breath sounds: Normal breath sounds.  Abdominal:     General: There is no distension.     Palpations: Abdomen is soft.     Tenderness: There is no abdominal tenderness.  Musculoskeletal:        General: No swelling.  Skin:    General: Skin is warm and dry.  Neurological:     General: No focal deficit present.     Mental Status: He is alert and oriented to person, place, and time.  Psychiatric:        Mood and Affect: Mood normal.        Behavior: Behavior normal.        Thought Content: Thought content normal.        Judgment: Judgment normal.     Results: None  Assessment & Plan:  [name] is a 78 y.o. male with metastatic prostate cancer who is going to undergo treatment. He says he wants to proceed with treatment.  We will get his port in as soon as possible, and per report he will be starting his first therapy in the upcoming week.   -Discussed risk of bleeding, infection, pneumothorax, malfunction, injury to vessel. He expressed understanding and wants to proceed.   All questions were answered to the satisfaction of the patient.     Virl Cagey 08/23/2019, 12:15 PM

## 2019-08-27 NOTE — Assessment & Plan Note (Signed)
1.  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 -Enzalutamide 160 mg daily started towards the end of November 2019. - PSA has been steadily increasing, last level at 9.37 in first week of September. 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  No evidence of skeletal metastasis.  Chronic compression fracture of T8. - I have told him to discontinue enzalutamide as it is not helping.  It is causing musculoskeletal pains. -I recommended doing a bone scan.  We will repeat another PSA and creatinine. -Guardant 360 did not show any mutation. -Germline mutation testing is also negative. -He will have ureteral stent placed by urology.  I have discussed with him about starting him on palliative chemotherapy with docetaxel.  We also discussed other option including best supportive care with hospice.  Patient opted for treatment. -We discussed side effects of treatment.  Our chemotherapy nurse also talked him in detail. -We will make referral for port placement.  2.  Bone strengthening: -Denosumab was started on 03/05/2017.  He is tolerating it very well.  He will continue calcium and vitamin D supplements.  3.  CKD: - Last creatinine was 1.76.  We will repeat it because of worsening hydronephrosis.

## 2019-08-29 ENCOUNTER — Encounter (HOSPITAL_COMMUNITY): Payer: Self-pay

## 2019-08-29 ENCOUNTER — Other Ambulatory Visit (HOSPITAL_COMMUNITY): Payer: Medicare Other

## 2019-08-29 ENCOUNTER — Ambulatory Visit (HOSPITAL_COMMUNITY): Payer: Medicare Other

## 2019-08-29 DIAGNOSIS — Z95828 Presence of other vascular implants and grafts: Secondary | ICD-10-CM

## 2019-08-29 HISTORY — DX: Presence of other vascular implants and grafts: Z95.828

## 2019-08-29 NOTE — Addendum Note (Signed)
Addended by: Joie Bimler on: 08/29/2019 11:47 AM   Modules accepted: Orders

## 2019-08-30 ENCOUNTER — Encounter (HOSPITAL_COMMUNITY): Payer: Self-pay | Admitting: General Practice

## 2019-08-30 ENCOUNTER — Ambulatory Visit (HOSPITAL_COMMUNITY): Payer: Medicare Other

## 2019-08-30 ENCOUNTER — Other Ambulatory Visit (HOSPITAL_COMMUNITY): Payer: Medicare Other

## 2019-08-30 ENCOUNTER — Encounter: Payer: Self-pay | Admitting: General Practice

## 2019-08-30 ENCOUNTER — Inpatient Hospital Stay (HOSPITAL_COMMUNITY): Payer: Medicare Other | Attending: Hematology | Admitting: General Practice

## 2019-08-30 DIAGNOSIS — C7951 Secondary malignant neoplasm of bone: Secondary | ICD-10-CM

## 2019-08-30 DIAGNOSIS — G8929 Other chronic pain: Secondary | ICD-10-CM | POA: Insufficient documentation

## 2019-08-30 DIAGNOSIS — Z5111 Encounter for antineoplastic chemotherapy: Secondary | ICD-10-CM | POA: Insufficient documentation

## 2019-08-30 DIAGNOSIS — N189 Chronic kidney disease, unspecified: Secondary | ICD-10-CM | POA: Insufficient documentation

## 2019-08-30 DIAGNOSIS — C61 Malignant neoplasm of prostate: Secondary | ICD-10-CM | POA: Insufficient documentation

## 2019-08-30 DIAGNOSIS — I10 Essential (primary) hypertension: Secondary | ICD-10-CM | POA: Insufficient documentation

## 2019-08-30 DIAGNOSIS — Z5189 Encounter for other specified aftercare: Secondary | ICD-10-CM | POA: Insufficient documentation

## 2019-08-30 DIAGNOSIS — M549 Dorsalgia, unspecified: Secondary | ICD-10-CM | POA: Insufficient documentation

## 2019-08-30 DIAGNOSIS — R197 Diarrhea, unspecified: Secondary | ICD-10-CM | POA: Insufficient documentation

## 2019-08-30 NOTE — Progress Notes (Signed)
[facility] CSW Progress Notes  Spoke w sister, Lovette Cliche, who confirms that patient is severely hard of hearing.  Says that this is a relatively recent problem.  Sister is concerned that he needs "more help at home."  Does not cook much, may not be eating well.  Son who lives in the home is "not much help."  Major concern is that he is not eating well, does not fix his own food, does not know how to cook.   Sister feels he would benefit from prepared meals, especially when he is in chemotherapy.  Sister is also aware that patient has significant hearing loss - he cannot hear his phone, often misses calls, is not able to hear conversations.  Sister is not able to help w transportation as she is currently unable to drive - does not feel patient should be driving himself to chemotherapy appointments - CSW will attempt to refer to [facility], but patient will need to schedule his own rides based on his appointments.  Edwyna Shell, LCSW Clinical Social Worker Phone:  [phone] Cell:  [phone]

## 2019-08-30 NOTE — Progress Notes (Addendum)
[place] Initial Psychosocial Assessment Clinical Social Work  Clinical Social Work contacted by phone to assess psychosocial, emotional, mental health, and spiritual needs of the patient.   Barriers to care/review of distress screen:  - Transportation:  Do you anticipate any problems getting to appointments?  Do you have someone who can help run errands for you if you need it?  States he drives himself and uses Customer service manager on Aging" transport as needed.  - Help at home:  What is your living situation (alone, family, other)?  If you are physically unable to care for yourself, who would you call on to help you?  Son lives in home w him.  Son "mows grass, washes dishes" - Support system:  What does your support system look like?  Who would you call on if you needed some kind of practical help?  What if you needed someone to talk to for emotional support?  Unclear, states his son helps w some practical things around the house.  His sister, Margaretha [place], lives nearby.  However, she is also hard of hearing.   - Finances:  Are you concerned about finances   Considering returning to work?  If not, applying for disability?  Retired, has [employer]. Says he can get the food he needs and has no concerns at this time.    What is your understanding of where you are with your cancer? Its cause?  Your treatment plan and what happens next?  Hearing impairment has made it very difficult for patient to clearly understand his disease and upcoming treatment plan.  Had a hearing aid in the past, but "it only helped for 30 minutes."  Has difficulty understanding next steps in his treatment plan, appointment times/dates.  Has no VM set up, says "my phone does not have VM" so cannot get information on any changes in appointments.  Aware that he will be starting chemotherapy but did not know when.  CSW walked him down to where he will come for preadmission testing, showed him how to get there from the front desk.  Reinforced time/date of  appointments.  He was also given calendar and explanation by [facility] scheduler.  Patient's issues are complicated by severe hearing impairment which has not been mitigated by any kind of assistive device thus patient has limited understanding of next steps.  However, he is clearly invested in treatment as he comes to [facility] at unscheduled times to be sure he has not missed an appointment.    What are your worries for the future as you begin treatment for cancer? Unable to assess given hearing impairment  What are your hopes and priorities during your treatment? What is important to you? What are your goals for your care?  Unable to assess given hearing impairment.  CSW Summary:  Patient and family psychosocial functioning including strengths, limitations, and coping skills:  Patient is elderly gentleman, beginning treatment for metastatic prostate cancer.  Lives w son who is minimally engaged w father's treatment at this time.  Son works during the day so cannot be of help to patient during [place]. 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  No current hearing aid or treatment for this condition.  Per record, he was referred to audiologist; however, given his difficulty managing the phone, he will be unlikely to receive information about appointments that might have been scheduled.  He will need assistive devices during his medical appointments to ensure that he does hear and understand the treatment regimen.  CSW will investigate options.  Will also investigate options for community support for hearing loss.   Identifications of barriers to care:  Cannot hear, may also have vision impairment but this is not certain.  Availability of community resources: [employer], audiologist referral has been requested by medical providers in the past.   Clinical Social Worker follow up needed: Yes.     Edwyna Shell, LCSW Clinical Social Worker Phone:  [phone]

## 2019-08-30 NOTE — Progress Notes (Signed)
[facility] CSW Progress Note  Patient has significant hearing loss - CSW called Merlyn Albert MD, patient's PCP to ask them to attempt to address this issue.  Per office, patient comes in next week for bloodwork, note has been placed in the chart requesting the MD to follow up on this issue.  Edwyna Shell, LCSW Clinical Social Worker Phone:  [phone] Cell:  [phone]

## 2019-09-05 ENCOUNTER — Encounter (HOSPITAL_COMMUNITY)
Admission: RE | Admit: 2019-09-05 | Discharge: 2019-09-05 | Disposition: A | Discharge status: Home / Self Care | Payer: Medicare Other | Source: Ambulatory Visit | Attending: General Surgery | Admitting: General Surgery

## 2019-09-05 ENCOUNTER — Other Ambulatory Visit: Payer: Self-pay

## 2019-09-05 ENCOUNTER — Ambulatory Visit (HOSPITAL_COMMUNITY): Payer: Medicare Other | Admitting: Hematology

## 2019-09-05 ENCOUNTER — Other Ambulatory Visit (HOSPITAL_COMMUNITY): Payer: Medicare Other

## 2019-09-05 ENCOUNTER — Other Ambulatory Visit (HOSPITAL_COMMUNITY)
Admission: RE | Admit: 2019-09-05 | Discharge: 2019-09-05 | Disposition: A | Discharge status: Home / Self Care | Payer: Medicare Other | Source: Ambulatory Visit | Attending: General Surgery | Admitting: General Surgery

## 2019-09-06 ENCOUNTER — Other Ambulatory Visit: Payer: Self-pay

## 2019-09-06 ENCOUNTER — Other Ambulatory Visit (HOSPITAL_COMMUNITY)
Admission: RE | Admit: 2019-09-06 | Discharge: 2019-09-06 | Disposition: A | Discharge status: Home / Self Care | Payer: Medicare Other | Source: Ambulatory Visit | Attending: General Surgery | Admitting: General Surgery

## 2019-09-06 DIAGNOSIS — Z01812 Encounter for preprocedural laboratory examination: Secondary | ICD-10-CM | POA: Insufficient documentation

## 2019-09-06 DIAGNOSIS — Z20828 Contact with and (suspected) exposure to other viral communicable diseases: Secondary | ICD-10-CM | POA: Insufficient documentation

## 2019-09-06 LAB — SARS CORONAVIRUS 2 (TAT 6-24 HRS): SARS Coronavirus 2: NEGATIVE

## 2019-09-06 NOTE — OR Nursing (Signed)
Patient was on the schedule for a covid test 09/05/2019, he was a no show. Office called spoke with Claiborne Billings to inform that he did not get his covid test.

## 2019-09-07 ENCOUNTER — Ambulatory Visit (HOSPITAL_COMMUNITY): Payer: Medicare Other

## 2019-09-07 ENCOUNTER — Encounter (HOSPITAL_COMMUNITY)
Admission: RE | Disposition: A | Discharge status: Home / Self Care | Payer: Self-pay | Source: Home / Self Care | Attending: General Surgery

## 2019-09-07 ENCOUNTER — Other Ambulatory Visit: Payer: Self-pay

## 2019-09-07 ENCOUNTER — Ambulatory Visit (HOSPITAL_COMMUNITY): Payer: Medicare Other | Admitting: Anesthesiology

## 2019-09-07 ENCOUNTER — Ambulatory Visit (HOSPITAL_COMMUNITY)
Admission: RE | Admit: 2019-09-07 | Discharge: 2019-09-07 | Disposition: A | Discharge status: Home / Self Care | Payer: Medicare Other | Attending: General Surgery | Admitting: General Surgery

## 2019-09-07 ENCOUNTER — Encounter (HOSPITAL_COMMUNITY): Payer: Self-pay | Admitting: *Deleted

## 2019-09-07 DIAGNOSIS — Z791 Long term (current) use of non-steroidal anti-inflammatories (NSAID): Secondary | ICD-10-CM | POA: Diagnosis not present

## 2019-09-07 DIAGNOSIS — E785 Hyperlipidemia, unspecified: Secondary | ICD-10-CM | POA: Insufficient documentation

## 2019-09-07 DIAGNOSIS — C61 Malignant neoplasm of prostate: Secondary | ICD-10-CM | POA: Diagnosis present

## 2019-09-07 DIAGNOSIS — Z95828 Presence of other vascular implants and grafts: Secondary | ICD-10-CM

## 2019-09-07 DIAGNOSIS — Z87891 Personal history of nicotine dependence: Secondary | ICD-10-CM | POA: Insufficient documentation

## 2019-09-07 DIAGNOSIS — G4730 Sleep apnea, unspecified: Secondary | ICD-10-CM | POA: Diagnosis not present

## 2019-09-07 DIAGNOSIS — Z79899 Other long term (current) drug therapy: Secondary | ICD-10-CM | POA: Diagnosis not present

## 2019-09-07 DIAGNOSIS — Z452 Encounter for adjustment and management of vascular access device: Secondary | ICD-10-CM | POA: Diagnosis not present

## 2019-09-07 DIAGNOSIS — C7951 Secondary malignant neoplasm of bone: Secondary | ICD-10-CM

## 2019-09-07 DIAGNOSIS — K219 Gastro-esophageal reflux disease without esophagitis: Secondary | ICD-10-CM | POA: Diagnosis not present

## 2019-09-07 HISTORY — PX: PORTACATH PLACEMENT: SHX2246

## 2019-09-07 SURGERY — INSERTION, TUNNELED CENTRAL VENOUS DEVICE, WITH PORT
Anesthesia: Monitor Anesthesia Care | Laterality: Left

## 2019-09-07 MED ORDER — PROMETHAZINE HCL 25 MG/ML IJ SOLN: 6.2500 mg | INTRAMUSCULAR | Status: DC | PRN

## 2019-09-07 MED ORDER — CHLORHEXIDINE GLUCONATE CLOTH 2 % EX PADS: 6.0000 | MEDICATED_PAD | Freq: Once | CUTANEOUS | Status: DC

## 2019-09-07 MED ORDER — PROPOFOL 500 MG/50ML IV EMUL
INTRAVENOUS | Status: DC | PRN
  Administered 2019-09-07: 75 ug/kg/min via INTRAVENOUS

## 2019-09-07 MED ORDER — HYDROCODONE-ACETAMINOPHEN 7.5-325 MG PO TABS: 1.0000 | ORAL_TABLET | Freq: Once | ORAL | Status: DC | PRN

## 2019-09-07 MED ORDER — LIDOCAINE 2% (20 MG/ML) 5 ML SYRINGE
INTRAMUSCULAR | Status: AC
  Filled 2019-09-07: qty 15

## 2019-09-07 MED ORDER — LIDOCAINE HCL (PF) 1 % IJ SOLN
INTRAMUSCULAR | Status: DC | PRN
  Administered 2019-09-07: 13 mL

## 2019-09-07 MED ORDER — HEPARIN SOD (PORK) LOCK FLUSH 100 UNIT/ML IV SOLN
INTRAVENOUS | Status: AC
  Filled 2019-09-07: qty 5

## 2019-09-07 MED ORDER — KETAMINE HCL 50 MG/5ML IJ SOSY
PREFILLED_SYRINGE | INTRAMUSCULAR | Status: AC
  Filled 2019-09-07: qty 5

## 2019-09-07 MED ORDER — CEFAZOLIN SODIUM-DEXTROSE 2-4 GM/100ML-% IV SOLN
2.0000 g | INTRAVENOUS | Status: DC
  Filled 2019-09-07: qty 100

## 2019-09-07 MED ORDER — PROPOFOL 10 MG/ML IV BOLUS
INTRAVENOUS | Status: DC | PRN
  Administered 2019-09-07 (×4): 10 mg via INTRAVENOUS

## 2019-09-07 MED ORDER — LIDOCAINE HCL (PF) 1 % IJ SOLN
INTRAMUSCULAR | Status: AC
  Filled 2019-09-07: qty 30

## 2019-09-07 MED ORDER — PROPOFOL 10 MG/ML IV BOLUS
INTRAVENOUS | Status: AC
  Filled 2019-09-07: qty 20

## 2019-09-07 MED ORDER — KETAMINE HCL 10 MG/ML IJ SOLN
INTRAMUSCULAR | Status: DC | PRN
  Administered 2019-09-07 (×3): 10 mg via INTRAVENOUS

## 2019-09-07 MED ORDER — HEPARIN SOD (PORK) LOCK FLUSH 100 UNIT/ML IV SOLN
INTRAVENOUS | Status: DC | PRN
  Administered 2019-09-07: 500 [IU] via INTRAVENOUS

## 2019-09-07 MED ORDER — LIDOCAINE HCL (CARDIAC) PF 100 MG/5ML IV SOSY
PREFILLED_SYRINGE | INTRAVENOUS | Status: DC | PRN
  Administered 2019-09-07: 50 mg via INTRAVENOUS

## 2019-09-07 MED ORDER — SODIUM CHLORIDE (PF) 0.9 % IJ SOLN
INTRAMUSCULAR | Status: DC | PRN
  Administered 2019-09-07: 50 mL via INTRAVENOUS

## 2019-09-07 MED ORDER — MIDAZOLAM HCL 2 MG/2ML IJ SOLN: 0.5000 mg | Freq: Once | INTRAMUSCULAR | Status: DC | PRN

## 2019-09-07 MED ORDER — LACTATED RINGERS IV SOLN: INTRAVENOUS | Status: DC

## 2019-09-07 MED ORDER — HYDROMORPHONE HCL 1 MG/ML IJ SOLN: 0.2500 mg | INTRAMUSCULAR | Status: DC | PRN

## 2019-09-07 MED ORDER — LACTATED RINGERS IV SOLN
INTRAVENOUS | Status: DC
  Administered 2019-09-07: 1000 mL via INTRAVENOUS

## 2019-09-07 SURGICAL SUPPLY — 39 items
ADH SKN CLS APL DERMABOND .7 (GAUZE/BANDAGES/DRESSINGS) ×1
APL PRP STRL LF ISPRP CHG 10.5 (MISCELLANEOUS) ×1
APPLICATOR CHLORAPREP 10.5 ORG (MISCELLANEOUS) ×2 IMPLANT
APPLIER CLIP 9.375 SM OPEN (CLIP)
APR CLP SM 9.3 20 MLT OPN (CLIP)
BAG DECANTER FOR FLEXI CONT (MISCELLANEOUS) ×2 IMPLANT
CLIP APPLIE 9.375 SM OPEN (CLIP) IMPLANT
CLOTH BEACON ORANGE TIMEOUT ST (SAFETY) ×2 IMPLANT
COVER LIGHT HANDLE STERIS (MISCELLANEOUS) ×4 IMPLANT
COVER WAND RF STERILE (DRAPES) ×2 IMPLANT
DECANTER SPIKE VIAL GLASS SM (MISCELLANEOUS) ×2 IMPLANT
DERMABOND ADVANCED (GAUZE/BANDAGES/DRESSINGS) ×1
DERMABOND ADVANCED .7 DNX12 (GAUZE/BANDAGES/DRESSINGS) ×1 IMPLANT
DRAPE C-ARM FOLDED MOBILE STRL (DRAPES) ×2 IMPLANT
ELECT REM PT RETURN 9FT ADLT (ELECTROSURGICAL) ×2
ELECTRODE REM PT RTRN 9FT ADLT (ELECTROSURGICAL) ×1 IMPLANT
GLOVE BIO SURGEON STRL SZ 6.5 (GLOVE) ×2 IMPLANT
GLOVE BIOGEL M 6.5 STRL (GLOVE) ×1 IMPLANT
GLOVE BIOGEL PI IND STRL 6.5 (GLOVE) ×1 IMPLANT
GLOVE BIOGEL PI IND STRL 7.0 (GLOVE) ×1 IMPLANT
GLOVE BIOGEL PI INDICATOR 6.5 (GLOVE) ×2
GLOVE BIOGEL PI INDICATOR 7.0 (GLOVE) ×1
GOWN STRL REUS W/TWL LRG LVL3 (GOWN DISPOSABLE) ×4 IMPLANT
IV NS 500ML (IV SOLUTION) ×2
IV NS 500ML BAXH (IV SOLUTION) ×1 IMPLANT
KIT PORT POWER 8FR ISP MRI (Port) ×2 IMPLANT
KIT TURNOVER KIT A (KITS) ×2 IMPLANT
MANIFOLD NEPTUNE II (INSTRUMENTS) ×2 IMPLANT
NDL HYPO 25X1 1.5 SAFETY (NEEDLE) ×1 IMPLANT
NEEDLE HYPO 25X1 1.5 SAFETY (NEEDLE) ×2 IMPLANT
PACK MINOR (CUSTOM PROCEDURE TRAY) ×2 IMPLANT
PAD ARMBOARD 7.5X6 YLW CONV (MISCELLANEOUS) ×2 IMPLANT
SET BASIN LINEN APH (SET/KITS/TRAYS/PACK) ×2 IMPLANT
SUT MNCRL AB 4-0 PS2 18 (SUTURE) ×2 IMPLANT
SUT PROLENE 2 0 SH 30 (SUTURE) ×2 IMPLANT
SUT VIC AB 3-0 SH 27 (SUTURE) ×2
SUT VIC AB 3-0 SH 27X BRD (SUTURE) ×1 IMPLANT
SYR 10ML LL (SYRINGE) ×4 IMPLANT
SYR CONTROL 10ML LL (SYRINGE) ×2 IMPLANT

## 2019-09-07 NOTE — Transfer of Care (Signed)
Immediate Anesthesia Transfer of Care Note  Patient: [name]  Procedure(s) Performed: INSERTION PORT-A-CATH (Left )  Patient Location: PACU  Anesthesia Type:MAC  Level of Consciousness: awake, alert , oriented and patient cooperative  Airway & Oxygen Therapy: Patient Spontanous Breathing  Post-op Assessment: Report given to RN and Post -op Vital signs reviewed and stable  Post vital signs: Reviewed and stable  Last Vitals:  Vitals Value Taken Time  BP 141/78 09/07/19 1052  Temp    Pulse 58 09/07/19 1054  Resp 13 09/07/19 1054  SpO2 100 % 09/07/19 1054  Vitals shown include unvalidated device data.  Last Pain:  Vitals:   09/07/19 0804  TempSrc: Oral  PainSc: 0-No pain      Patients Stated Pain Goal: 5 (01/00/71 2197)  Complications: No apparent anesthesia complications

## 2019-09-07 NOTE — Op Note (Signed)
Operative Note 09/07/19   Preoperative Diagnosis:  Metastatic prostate cancer    Postoperative Diagnosis: Same   Procedure(s) Performed: Port-A-Cath placement, left subclavian    Surgeon: Lanell Matar. Constance Haw, MD   Assistants: No qualified resident was available   Anesthesia: Monitored anesthesia care   Anesthesiologist: Lenice Llamas, MD    Specimens: None   Estimated Blood Loss: Minimal   Fluoroscopy time: 20 seconds   Blood Replacement: None    Complications: None    Operative Findings: Normal anatomy   Indications:  [name] is a 78 yo with metastatic prostate cancer referred by oncology for a port a catheter. We discussed the risk of the procedure including but not limited to bleeding, infection, malfunction, injury to vessels, pneumothorax, and he opted to proceed.   Procedure: The patient was brought into the operating room and monitored anesthesia was induced.  One percent lidocaine was used for local anesthesia.   The left chest and neck was prepped and draped in the usual sterile fashion.  Preoperative antibiotics were given.  An incision was made below the left clavicle. A subcutaneous pocket was formed. The needles advanced into the left subclavian vein using the Seldinger technique without difficulty. A guidewire was then advanced into the right atrium under fluoroscopic guidance.  Ectopia was not noted. An introducer and peel-away sheath were placed over the guidewire. The catheter was then inserted through the peel-away sheath and the peel-away sheath was removed.  A spot film was performed to confirm the position. The catheter was then attached to the port and the port placed in subcutaneous pocket. Adequate positioning was confirmed by fluoroscopy. Hemostasis was confirmed, and the port was secured with 2-0 prolene sutures.  Good backflow of blood was noted on aspiration of the port. The port was flushed with heparin flush. Subcutaneous layer was reapproximated  using a 3-0 Vicryl interrupted suture. The skin was closed using a 4-0 Vicryl subcuticular suture. Dermabond was applied.  All tape and needle counts were correct at the end of the procedure. The patient was transferred to PACU in stable condition. A chest x-ray will be performed at that time.  Curlene Labrum, MD [facility] [address] [place], [place] [address] [phone] (office)

## 2019-09-07 NOTE — Anesthesia Preprocedure Evaluation (Signed)
Anesthesia Evaluation  Patient identified by MRN, date of birth, ID band Patient awake  General Assessment Comment:Pt very HOH , unable to amswer simple questions  Does know why he is here  S/p recent intervention   Reviewed: Allergy & Precautions, NPO status , Patient's Chart, lab work & pertinent test results  Airway Mallampati: II  TM Distance: >3 FB Neck ROM: Full    Dental no notable dental hx. (+) Edentulous Upper, Edentulous Lower   Pulmonary sleep apnea , former smoker,    Pulmonary exam normal breath sounds clear to auscultation       Cardiovascular Exercise Tolerance: Poor hypertension, Pt. on medications negative cardio ROS Normal cardiovascular examII Rhythm:Regular Rate:Normal     Neuro/Psych negative neurological ROS  negative psych ROS   GI/Hepatic Neg liver ROS, PUD, GERD  Medicated and Controlled,  Endo/Other  Morbid obesity  Renal/GU Renal InsufficiencyRenal disease  negative genitourinary   Musculoskeletal  (+) Arthritis , Osteoarthritis,    Abdominal   Peds negative pediatric ROS (+)  Hematology negative hematology ROS (+)   Anesthesia Other Findings Here for port for metastatic prostate Ca  Reproductive/Obstetrics negative OB ROS                             Anesthesia Physical Anesthesia Plan  ASA: IV  Anesthesia Plan: MAC   Post-op Pain Management:    Induction: Intravenous  PONV Risk Score and Plan: 1 and Ondansetron and Treatment may vary due to age or medical condition  Airway Management Planned: Nasal Cannula and Simple Face Mask  Additional Equipment:   Intra-op Plan:   Post-operative Plan:   Informed Consent: I have reviewed the patients History and Physical, chart, labs and discussed the procedure including the risks, benefits and alternatives for the proposed anesthesia with the patient or authorized representative who has indicated his/her  understanding and acceptance.     Dental advisory given  Plan Discussed with: CRNA  Anesthesia Plan Comments: (Plan Full PPE use Plan MAC as tolerated d/w pt -WTP with same after Q&A  D/w pt if GA needed will probably use GETA, and do our best to extubate at the end. )        Anesthesia Quick Evaluation

## 2019-09-07 NOTE — Discharge Instructions (Signed)
Keep area clean and dry.  You can take a shower in 24 hours.  Do not submerge in water.  Take tylenol and ibuprofen for pain control and your home tramadol for severe pain.   Implanted [facility] Guide An implanted port is a device that is placed under the skin. It is usually placed in the chest. The device can be used to give IV medicine, to take blood, or for dialysis. You may have an implanted port if:  You need IV medicine that would be irritating to the small veins in your hands or arms.  You need IV medicines, such as antibiotics, for a long period of time.  You need IV nutrition for a long period of time.  You need dialysis. Having a port means that your health care provider will not need to use the veins in your arms for these procedures. You may have fewer limitations when using a port than you would if you used other types of long-term IVs, and you will likely be able to return to normal activities after your incision heals. An implanted port has two main parts:  Reservoir. The reservoir is the part where a needle is inserted to give medicines or draw blood. The reservoir is round. After it is placed, it appears as a small, raised area under your skin.  Catheter. The catheter is a thin, flexible tube that connects the reservoir to a vein. Medicine that is inserted into the reservoir goes into the catheter and then into the vein. How is my port accessed? To access your port:  A numbing cream may be placed on the skin over the port site.  Your health care provider will put on a mask and sterile gloves.  The skin over your port will be cleaned carefully with a germ-killing soap and allowed to dry.  Your health care provider will gently pinch the port and insert a needle into it.  Your health care provider will check for a blood return to make sure the port is in the vein and is not clogged.  If your port needs to remain accessed to get medicine continuously (constant  infusion), your health care provider will place a clear bandage (dressing) over the needle site. The dressing and needle will need to be changed every week, or as told by your health care provider. What is flushing? Flushing helps keep the port from getting clogged. Follow instructions from your health care provider about how and when to flush the port. Ports are usually flushed with saline solution or a medicine called heparin. The need for flushing will depend on how the port is used:  If the port is only used from time to time to give medicines or draw blood, the port may need to be flushed: ? Before and after medicines have been given. ? Before and after blood has been drawn. ? As part of routine maintenance. Flushing may be recommended every 4-6 weeks.  If a constant infusion is running, the port may not need to be flushed.  Throw away any syringes in a disposal container that is meant for sharp items (sharps container). You can buy a sharps container from a pharmacy, or you can make one by using an empty hard plastic bottle with a cover. How long will my port stay implanted? The port can stay in for as long as your health care provider thinks it is needed. When it is time for the port to come out, a surgery will be  done to remove it. The surgery will be similar to the procedure that was done to put the port in. Follow these instructions at home:   Flush your port as told by your health care provider.  If you need an infusion over several days, follow instructions from your health care provider about how to take care of your port site. Make sure you: ? Wash your hands with soap and water before you change your dressing. If soap and water are not available, use alcohol-based hand sanitizer. ? Change your dressing as told by your health care provider. ? Place any used dressings or infusion bags into a plastic bag. Throw that bag in the trash. ? Keep the dressing that covers the needle clean  and dry. Do not get it wet. ? Do not use scissors or sharp objects near the tube. ? Keep the tube clamped, unless it is being used.  Check your port site every day for signs of infection. Check for: ? Redness, swelling, or pain. ? Fluid or blood. ? Pus or a bad smell.  Protect the skin around the port site. ? Avoid wearing bra straps that rub or irritate the site. ? Protect the skin around your port from seat belts. Place a soft pad over your chest if needed.  Bathe or shower as told by your health care provider. The site may get wet as long as you are not actively receiving an infusion.  Return to your normal activities as told by your health care provider. Ask your health care provider what activities are safe for you.  Carry a medical alert card or wear a medical alert bracelet at all times. This will let health care providers know that you have an implanted port in case of an emergency. Get help right away if:  You have redness, swelling, or pain at the port site.  You have fluid or blood coming from your port site.  You have pus or a bad smell coming from the port site.  You have a fever. Summary  Implanted ports are usually placed in the chest for long-term IV access.  Follow instructions from your health care provider about flushing the port and changing bandages (dressings).  Take care of the area around your port by avoiding clothing that puts pressure on the area, and by watching for signs of infection.  Protect the skin around your port from seat belts. Place a soft pad over your chest if needed.  Get help right away if you have a fever or you have redness, swelling, pain, drainage, or a bad smell at the port site. This information is not intended to replace advice given to you by your health care provider. Make sure you discuss any questions you have with your health care provider. Document Released: 10/13/2005 Document Revised: 02/04/2019 Document Reviewed:  11/15/2016 Elsevier Patient Education  2020 [employer].

## 2019-09-07 NOTE — Anesthesia Postprocedure Evaluation (Signed)
Anesthesia Post Note  Patient: [name]  Procedure(s) Performed: INSERTION PORT-A-CATH (Left )  Patient location during evaluation: PACU Anesthesia Type: MAC Level of consciousness: awake Pain management: pain level controlled Vital Signs Assessment: post-procedure vital signs reviewed and stable Respiratory status: spontaneous breathing Cardiovascular status: stable Postop Assessment: no apparent nausea or vomiting Anesthetic complications: no     Last Vitals:  Vitals:   09/07/19 0804 09/07/19 1052  BP: 130/62 (!) 141/78  Pulse: 72 (!) 58  Resp: 20 14  Temp: 36.9 C 36.7 C  SpO2: 98% 98%    Last Pain:  Vitals:   09/07/19 1052  TempSrc:   PainSc: 0-No pain                 Everette Rank

## 2019-09-07 NOTE — Progress Notes (Signed)
[facility]  Notified patient's friend Loralie Champagne that procedure completed. CXR pending. Patient has tramadol at home.  Curlene Labrum, MD [facility] [address] [place], [place] [address] [phone] (office)

## 2019-09-07 NOTE — Progress Notes (Signed)
[facility]  CXR with port in good position and no PTX.  Curlene Labrum, MD [facility] [address] [place], [place] [address] [id number] [phone] (office)

## 2019-09-07 NOTE — Interval H&P Note (Signed)
History and Physical Interval Note:  09/07/2019 9:47 AM  [name]  has presented today for surgery, with the diagnosis of Prostate cancer metastatic to bone.  The various methods of treatment have been discussed with the patient and family. After consideration of risks, benefits and other options for treatment, the patient has consented to  Procedure(s): INSERTION PORT-A-CATH (Left) as a surgical intervention.  The patient's history has been reviewed, patient examined, no change in status, stable for surgery.  I have reviewed the patient's chart and labs.  Questions were answered to the patient's satisfaction.    No changes. No questions.  Virl Cagey

## 2019-09-08 ENCOUNTER — Other Ambulatory Visit (HOSPITAL_COMMUNITY): Payer: Medicare Other

## 2019-09-08 ENCOUNTER — Inpatient Hospital Stay (HOSPITAL_COMMUNITY): Payer: Medicare Other

## 2019-09-08 ENCOUNTER — Inpatient Hospital Stay (HOSPITAL_BASED_OUTPATIENT_CLINIC_OR_DEPARTMENT_OTHER): Payer: Medicare Other | Admitting: Hematology

## 2019-09-08 ENCOUNTER — Encounter (HOSPITAL_COMMUNITY): Payer: Self-pay | Admitting: Hematology

## 2019-09-08 ENCOUNTER — Ambulatory Visit (HOSPITAL_COMMUNITY): Payer: Medicare Other

## 2019-09-08 VITALS — BP 142/63 | HR 56 | Temp 96.8°F | Resp 18

## 2019-09-08 DIAGNOSIS — C61 Malignant neoplasm of prostate: Secondary | ICD-10-CM | POA: Diagnosis not present

## 2019-09-08 DIAGNOSIS — C7951 Secondary malignant neoplasm of bone: Secondary | ICD-10-CM

## 2019-09-08 DIAGNOSIS — Z5111 Encounter for antineoplastic chemotherapy: Secondary | ICD-10-CM | POA: Diagnosis not present

## 2019-09-08 DIAGNOSIS — M549 Dorsalgia, unspecified: Secondary | ICD-10-CM | POA: Diagnosis not present

## 2019-09-08 DIAGNOSIS — R197 Diarrhea, unspecified: Secondary | ICD-10-CM | POA: Diagnosis not present

## 2019-09-08 DIAGNOSIS — Z5189 Encounter for other specified aftercare: Secondary | ICD-10-CM | POA: Diagnosis not present

## 2019-09-08 DIAGNOSIS — Z95828 Presence of other vascular implants and grafts: Secondary | ICD-10-CM

## 2019-09-08 DIAGNOSIS — G8929 Other chronic pain: Secondary | ICD-10-CM | POA: Diagnosis not present

## 2019-09-08 DIAGNOSIS — N189 Chronic kidney disease, unspecified: Secondary | ICD-10-CM | POA: Diagnosis not present

## 2019-09-08 DIAGNOSIS — I10 Essential (primary) hypertension: Secondary | ICD-10-CM | POA: Diagnosis not present

## 2019-09-08 LAB — COMPREHENSIVE METABOLIC PANEL
ALT: 12 U/L (ref 0–44)
AST: 22 U/L (ref 15–41)
Albumin: 3.8 g/dL (ref 3.5–5.0)
Alkaline Phosphatase: 65 U/L (ref 38–126)
Anion gap: 10 (ref 5–15)
BUN: 16 mg/dL (ref 8–23)
CO2: 24 mmol/L (ref 22–32)
Calcium: 9.1 mg/dL (ref 8.9–10.3)
Chloride: 104 mmol/L (ref 98–111)
Creatinine, Ser: 1.71 mg/dL — ABNORMAL HIGH (ref 0.61–1.24)
GFR calc Af Amer: 43 mL/min — ABNORMAL LOW (ref >60)
GFR calc non Af Amer: 37 mL/min — ABNORMAL LOW (ref >60)
Glucose, Bld: 100 mg/dL — ABNORMAL HIGH (ref 70–99)
Potassium: 3.6 mmol/L (ref 3.5–5.1)
Sodium: 138 mmol/L (ref 135–145)
Total Bilirubin: 0.6 mg/dL (ref 0.3–1.2)
Total Protein: 6.7 g/dL (ref 6.5–8.1)

## 2019-09-08 LAB — CBC WITH DIFFERENTIAL/PLATELET
Abs Immature Granulocytes: 0.03 10*3/uL (ref 0.00–0.07)
Basophils Absolute: 0 10*3/uL (ref 0.0–0.1)
Basophils Relative: 0 %
Eosinophils Absolute: 0.1 10*3/uL (ref 0.0–0.5)
Eosinophils Relative: 2 %
HCT: 34.1 % — ABNORMAL LOW (ref 39.0–52.0)
Hemoglobin: 10.9 g/dL — ABNORMAL LOW (ref 13.0–17.0)
Immature Granulocytes: 0 %
Lymphocytes Relative: 26 %
Lymphs Abs: 1.9 10*3/uL (ref 0.7–4.0)
MCH: 28.8 pg (ref 26.0–34.0)
MCHC: 32 g/dL (ref 30.0–36.0)
MCV: 90 fL (ref 80.0–100.0)
Monocytes Absolute: 0.5 10*3/uL (ref 0.1–1.0)
Monocytes Relative: 6 %
Neutro Abs: 4.8 10*3/uL (ref 1.7–7.7)
Neutrophils Relative %: 66 %
Platelets: 167 10*3/uL (ref 150–400)
RBC: 3.79 MIL/uL — ABNORMAL LOW (ref 4.22–5.81)
RDW: 14.6 % (ref 11.5–15.5)
WBC: 7.2 10*3/uL (ref 4.0–10.5)
nRBC: 0 % (ref 0.0–0.2)

## 2019-09-08 MED ORDER — DIPHENHYDRAMINE HCL 50 MG/ML IJ SOLN
25.0000 mg | Freq: Once | INTRAMUSCULAR | Status: AC
  Administered 2019-09-08: 25 mg via INTRAVENOUS
  Filled 2019-09-08: qty 1

## 2019-09-08 MED ORDER — SODIUM CHLORIDE 0.9 % IV SOLN
50.0000 mg/m2 | Freq: Once | INTRAVENOUS | Status: AC
  Administered 2019-09-08: 120 mg via INTRAVENOUS
  Filled 2019-09-08: qty 12

## 2019-09-08 MED ORDER — SODIUM CHLORIDE 0.9 % IV SOLN
8.0000 mg | Freq: Once | INTRAVENOUS | Status: AC
  Administered 2019-09-08: 11:00:00 8 mg via INTRAVENOUS
  Filled 2019-09-08: qty 4

## 2019-09-08 MED ORDER — HEPARIN SOD (PORK) LOCK FLUSH 100 UNIT/ML IV SOLN
500.0000 [IU] | Freq: Once | INTRAVENOUS | Status: AC | PRN
  Administered 2019-09-08: 500 [IU]

## 2019-09-08 MED ORDER — SODIUM CHLORIDE 0.9 % IV SOLN
10.0000 mg | Freq: Once | INTRAVENOUS | Status: AC
  Administered 2019-09-08: 10 mg via INTRAVENOUS
  Filled 2019-09-08: qty 1

## 2019-09-08 MED ORDER — SODIUM CHLORIDE 0.9% FLUSH
10.0000 mL | INTRAVENOUS | Status: DC | PRN
  Administered 2019-09-08: 10 mL
  Filled 2019-09-08: qty 10

## 2019-09-08 MED ORDER — SODIUM CHLORIDE 0.9 % IV SOLN
Freq: Once | INTRAVENOUS | Status: AC
  Administered 2019-09-08: 11:00:00 via INTRAVENOUS

## 2019-09-08 MED ORDER — DENOSUMAB 120 MG/1.7ML ~~LOC~~ SOLN
120.0000 mg | Freq: Once | SUBCUTANEOUS | Status: AC
  Administered 2019-09-08: 120 mg via SUBCUTANEOUS
  Filled 2019-09-08: qty 1.7

## 2019-09-08 MED ORDER — FAMOTIDINE IN NACL 20-0.9 MG/50ML-% IV SOLN
20.0000 mg | Freq: Once | INTRAVENOUS | Status: AC
  Administered 2019-09-08: 20 mg via INTRAVENOUS
  Filled 2019-09-08: qty 50

## 2019-09-08 NOTE — Patient Instructions (Addendum)
[facility] Discharge Instructions  You were seen today by Dr. Delton Coombes. He went over your recent lab results. He will see you back in 1 week for labs and follow up.  Start taking your nausea medication every 6 hours starting today after you get home from treatment, continue taking every 6 hours until tomorrow afternoon.  Thank you for choosing [place] at [facility] to provide your oncology and hematology care.  To afford each patient quality time with our provider, please arrive at least 15 minutes before your scheduled appointment time.   If you have a lab appointment with the [facility] please come in thru the  Main Entrance and check in at the main information desk  You need to re-schedule your appointment should you arrive 10 or more minutes late.  We strive to give you quality time with our providers, and arriving late affects you and other patients whose appointments are after yours.  Also, if you no show three or more times for appointments you may be dismissed from the clinic at the providers discretion.     Again, thank you for choosing [facility].  Our hope is that these requests will decrease the amount of time that you wait before being seen by our physicians.       _____________________________________________________________  Should you have questions after your visit to [facility], please contact our office at [phone] between the hours of 8:00 a.m. and 4:30 p.m.  Voicemails left after 4:00 p.m. will not be returned until the following business day.  For prescription refill requests, have your pharmacy contact our office and allow 72 hours.    Cancer Center Support Programs:   > Cancer Support Group  2nd Tuesday of the month 1pm-2pm, Journey Room

## 2019-09-08 NOTE — Progress Notes (Signed)
[place], [place] [address]   CLINIC:  Medical Oncology/Hematology  PCP:  Celene Squibb, MD [place] [address] [phone]   REASON FOR VISIT:  Follow-up for metastatic prostate cancer    INTERVAL HISTORY:  [name] 78 y.o. male seen for follow-up of metastatic castration resistant prostate cancer.  Denies any new onset pains.  Had a port placed recently.  Appetite is 50%.  Energy levels are 75%.  Has severe deafness.  Denies any nausea, vomiting, diarrhea or constipation.  No fevers or infections.  Denies any tingling or numbness next 20s.  REVIEW OF SYSTEMS:  Review of Systems  All other systems reviewed and are negative.    PAST MEDICAL/SURGICAL HISTORY:  Past Medical History:  Diagnosis Date  . Cancer [facility])    prostate  . Family history of breast cancer   . Family history of prostate cancer   . GERD (gastroesophageal reflux disease)   . HOH (hard of hearing)   . Hyperlipidemia   . Hypertension   . Port-A-Cath in place 08/29/2019  . Prostate cancer metastatic to bone ([place]) 04/04/2016  . Sleep apnea 10/24/2016   Past Surgical History:  Procedure Laterality Date  . CATARACT EXTRACTION W/PHACO Right 04/22/2019   Procedure: CATARACT EXTRACTION PHACO AND INTRAOCULAR LENS PLACEMENT RIGHT EYE;  Surgeon: Baruch Goldmann, MD;  Location: [facility];  Service: Ophthalmology;  Laterality: Right;  right  . CATARACT EXTRACTION W/PHACO Left 05/13/2019   Procedure: CATARACT EXTRACTION PHACO AND INTRAOCULAR LENS PLACEMENT LEFT EYE;  Surgeon: Baruch Goldmann, MD;  Location: [facility];  Service: Ophthalmology;  Laterality: Left;  left  . CHOLECYSTECTOMY    . CYSTOSCOPY W/ URETERAL STENT PLACEMENT Left 08/17/2019   Procedure: CYSTOSCOPY WITH RETROGRADE PYELOGRAM/URETERAL STENT PLACEMENT;  Surgeon: Cleon Gustin, MD;  Location: [facility];  Service: Urology;  Laterality: Left;  . HAND SURGERY    . INGUINAL HERNIA REPAIR Left   .  PORTACATH PLACEMENT Left 09/07/2019   Procedure: INSERTION PORT-A-CATH;  Surgeon: Virl Cagey, MD;  Location: [facility];  Service: General;  Laterality: Left;  . PROSTATECTOMY    . PROSTATECTOMY    . radical prostatectomy    . TONSILLECTOMY       SOCIAL HISTORY:  Social History   Socioeconomic History  . Marital status: Widowed    Spouse name: Not on file  . Number of children: 3  . Years of education: 9  . Highest education level: Not on file  Occupational History  . Occupation: Animator- retired    Comment: [employer]  Social Needs  . Financial resource strain: Not on file  . Food insecurity    Worry: Not on file    Inability: Not on file  . Transportation needs    Medical: Not on file    Non-medical: Not on file  Tobacco Use  . Smoking status: Former Smoker    Packs/day: 1.00    Years: 32.00    Pack years: 32.00    Types: Cigarettes    Quit date: 02/04/1989    Years since quitting: 30.6  . Smokeless tobacco: Never Used  Substance and Sexual Activity  . Alcohol use: No  . Drug use: No  . Sexual activity: Not Currently  Lifestyle  . Physical activity    Days per week: Not on file    Minutes per session: Not on file  . Stress: Not on file  Relationships  . Social  connections    Talks on phone: Not on file    Gets together: Not on file    Attends religious service: Not on file    Active member of club or organization: Not on file    Attends meetings of clubs or organizations: Not on file    Relationship status: Not on file  . Intimate partner violence    Fear of current or ex partner: Not on file    Emotionally abused: Not on file    Physically abused: Not on file    Forced sexual activity: Not on file  Other Topics Concern  . Not on file  Social History Narrative   Widowed   [place] with son   Chronic low back pain    FAMILY HISTORY:  Family History  Problem Relation Age of Onset  . Heart failure Mother        CHF  . Cancer Mother         unknown  . Prostate cancer Father   . Narcolepsy Sister   . Cancer Daughter        unknown  . Breast cancer Maternal Aunt   . Cancer Maternal Grandmother        unknown  . Cancer Maternal Aunt        unknown type of cancer    CURRENT MEDICATIONS:  Outpatient Encounter Medications as of 09/08/2019  Medication Sig Note  . benazepril (LOTENSIN) 20 MG tablet TAKE ONE TABLET BY MOUTH DAILY.   . calcium-vitamin D (OSCAL WITH D) 500-200 MG-UNIT TABS tablet Take 1 tablet by mouth daily.   . cetirizine (ZYRTEC) 10 MG tablet TAKE ONE TABLET BY MOUTH DAILY.   . clotrimazole-betamethasone (LOTRISONE) cream Apply 1 application topically 2 (two) times daily.   . DOCETAXEL IV Inject into the vein every 21 ( twenty-one) days.   . Ferrous Sulfate (IRON) 325 (65 Fe) MG TABS Take 1 tablet by mouth daily.   . fexofenadine (ALLEGRA) 180 MG tablet Take 180 mg by mouth daily. 11/02/2018: Not sure which allergy medication [name] is currently taking   . fluticasone (FLONASE) 50 MCG/ACT nasal spray USE ONE SPRAY IN EACH NOSTRIL ONCE DAILY. SHAKE GENTLY BEFORE USING.   . furosemide (LASIX) 40 MG tablet TAKE ONE TABLET BY MOUTH DAILY. TAKE WITH POTASSIUM.   Marland Kitchen hydrochlorothiazide (HYDRODIURIL) 25 MG tablet TAKE ONE TABLET BY MOUTH DAILY.   Marland Kitchen ILEVRO 0.3 % ophthalmic suspension INSTILL ONE DROP IN THE OPERATIVE EYE DAILY. START TWO DAYS BEFORE SURGERY AND CONTINUE AS DIRECTED.   Marland Kitchen ketoconazole (NIZORAL) 2 % cream APPLY A SMALL AMOUNT TO FACE RASH TWICE DAILY FOR UP TO FOUR WEEKS IF NEEDED.   Marland Kitchen lidocaine-prilocaine (EMLA) cream Apply small amount to port a cath site and cover with plastic wrap 1 hour prior to chemotherapy appointments   . losartan (COZAAR) 100 MG tablet Take 100 mg by mouth daily.   . meloxicam (MOBIC) 7.5 MG tablet Take 1 tablet (7.5 mg total) daily by mouth.   . methylphenidate (RITALIN) 20 MG tablet Take 20 mg by mouth daily.   . mirabegron ER (MYRBETRIQ) 25 MG TB24 tablet Take by mouth.   .  moxifloxacin (VIGAMOX) 0.5 % ophthalmic solution INSTILL ONE DROP THREE TIMES DAILY IN THE OPERATIVE EYE. START TWO DAYS BEFORE SURGERY AND CONTINUE AS DIRECTED.   Marland Kitchen potassium chloride (KLOR-CON) 20 MEQ packet Take by mouth.   . prednisoLONE acetate (PRED FORTE) 1 % ophthalmic suspension INSTILL ONE DROP IN THE OPERATIVE EYE  THREE TIMES DAILY. START ONE DAY AFTER SURGERY AND CONTINUE AS DIRECTED.   Marland Kitchen prochlorperazine (COMPAZINE) 10 MG tablet Take 1 tablet (10 mg total) by mouth every 6 (six) hours as needed for nausea or vomiting.   . selenium sulfide (SELSUN) 2.5 % shampoo Apply 1 application topically daily as needed for irritation.   . traMADol (ULTRAM) 50 MG tablet Take 1 tablet (50 mg total) by mouth every 6 (six) hours as needed.   . [DISCONTINUED] potassium chloride SA (K-DUR,KLOR-CON) 20 MEQ tablet Take 20 mEq by mouth daily.    No facility-administered encounter medications on file as of 09/08/2019.     ALLERGIES:  No Known Allergies   PHYSICAL EXAM:  ECOG Performance status: 1  Vitals:   09/08/19 0829  BP: (!) 147/72  Pulse: 71  Resp: 20  Temp: (!) 97.5 F (36.4 C)  SpO2: 99%   Filed Weights   09/08/19 0829  Weight: 272 lb 8 oz (123.6 kg)    Physical Exam Vitals signs reviewed.  Constitutional:      Appearance: Normal appearance.  Cardiovascular:     Rate and Rhythm: Normal rate and regular rhythm.     Heart sounds: Normal heart sounds.  Pulmonary:     Effort: Pulmonary effort is normal.     Breath sounds: Normal breath sounds.  Abdominal:     General: There is no distension.     Palpations: Abdomen is soft. There is no mass.  Musculoskeletal:        General: Swelling present.  Skin:    General: Skin is warm.  Neurological:     General: No focal deficit present.     Mental Status: [name] is alert and oriented to person, place, and time.  Psychiatric:        Mood and Affect: Mood normal.        Behavior: Behavior normal.      LABORATORY DATA:  I have  reviewed the labs as listed.  CBC    Component Value Date/Time   WBC 7.2 09/08/2019 0839   RBC 3.79 (L) 09/08/2019 0839   HGB 10.9 (L) 09/08/2019 0839   HCT 34.1 (L) 09/08/2019 0839   PLT 167 09/08/2019 0839   MCV 90.0 09/08/2019 0839   MCH 28.8 09/08/2019 0839   MCHC 32.0 09/08/2019 0839   RDW 14.6 09/08/2019 0839   LYMPHSABS 1.9 09/08/2019 0839   MONOABS 0.5 09/08/2019 0839   EOSABS 0.1 09/08/2019 0839   BASOSABS 0.0 09/08/2019 0839   CMP Latest Ref Rng & Units 09/08/2019 08/11/2019 07/27/2019  Glucose 70 - 99 mg/dL 100(H) 110(H) 92  BUN 8 - 23 mg/dL 16 24(H) 23  Creatinine 0.61 - 1.24 mg/dL 1.71(H) 1.86(H) 1.83(H)  Sodium 135 - 145 mmol/L 138 141 136  Potassium 3.5 - 5.1 mmol/L 3.6 4.1 4.3  Chloride 98 - 111 mmol/L 104 104 101  CO2 22 - 32 mmol/L 24 28 25   Calcium 8.9 - 10.3 mg/dL 9.1 9.4 8.9  Total Protein 6.5 - 8.1 g/dL 6.7 7.4 -  Total Bilirubin 0.3 - 1.2 mg/dL 0.6 0.7 -  Alkaline Phos 38 - 126 U/L 65 61 -  AST 15 - 41 U/L 22 23 -  ALT 0 - 44 U/L 12 14 -       DIAGNOSTIC IMAGING:  I have independently reviewed the scans and discussed with the [name].   I have reviewed Venita Lick LPN's note and agree with the documentation.  I personally performed a face-to-face visit,  made revisions and my assessment and plan is as follows.    ASSESSMENT & PLAN:   Prostate cancer metastatic to bone ([place]) 1.  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 -Enzalutamide 160 mg daily started towards the end of November 2019 and discontinued in September 2020 upon progression. 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  No  evidence of skeletal metastasis.  Chronic compression fracture of T8. -Germline mutation testing was negative.  Guardant 360 did not show any FDA approved therapies. -Bone scan on 07/27/2019 shows uptake at T8.  No other uptake.  PSA today 16.8. -Docetaxel chemotherapy was recommended.  We will dose reduce based on his age.  We talked about the side effects in detail.  We will also give Neulasta support. -We reviewed his blood work today.  [name] will proceed with cycle 1 today.  [name] will be seen back in 1 week to see how [name] is tolerating.  2.  Bone strengthening: -Denosumab was started on 03/05/2017.  [name] is tolerating it very well.  [name] will continue calcium and vitamin D supplements.  3.  CKD: -Creatinine increased to 1.8 because of hydronephrosis.  Total time spent is 25 minutes with more than 50% of the time spent face-to-face discussing treatment plan, counseling and coordination of care.  Orders placed this encounter:  No orders of the defined types were placed in this encounter.     Derek Jack, MD [place] [phone]

## 2019-09-08 NOTE — Assessment & Plan Note (Signed)
1.  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 -Enzalutamide 160 mg daily started towards the end of November 2019 and discontinued in September 2020 upon progression. 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  No evidence of skeletal metastasis.  Chronic compression fracture of T8. -Germline mutation testing was negative.  Guardant 360 did not show any FDA approved therapies. -Bone scan on 07/27/2019 shows uptake at T8.  No other uptake.  PSA today 16.8. -Docetaxel chemotherapy was recommended.  We will dose reduce based on his age.  We talked about the side effects in detail.  We will also give Neulasta support. -We reviewed his blood work today.  He will proceed with cycle 1 today.  He will be seen back in 1 week to see how he is tolerating.  2.  Bone strengthening: -Denosumab was started on 03/05/2017.  He is tolerating it very well.  He will continue calcium and vitamin D supplements.  3.  CKD: -Creatinine increased to 1.8 because of hydronephrosis.

## 2019-09-08 NOTE — Patient Instructions (Signed)
[facility] Discharge Instructions for Patients Receiving Chemotherapy  Today you received the following chemotherapy agents   To help prevent nausea and vomiting after your treatment, we encourage you to take your nausea medication   If you develop nausea and vomiting that is not controlled by your nausea medication, call the clinic.   BELOW ARE SYMPTOMS THAT SHOULD BE REPORTED IMMEDIATELY:  *FEVER GREATER THAN 100.5 F  *CHILLS WITH OR WITHOUT FEVER  NAUSEA AND VOMITING THAT IS NOT CONTROLLED WITH YOUR NAUSEA MEDICATION  *UNUSUAL SHORTNESS OF BREATH  *UNUSUAL BRUISING OR BLEEDING  TENDERNESS IN MOUTH AND THROAT WITH OR WITHOUT PRESENCE OF ULCERS  *URINARY PROBLEMS  *BOWEL PROBLEMS  UNUSUAL RASH Items with * indicate a potential emergency and should be followed up as soon as possible.  Feel free to call the clinic should you have any questions or concerns. The clinic phone number is [phone].  Please show the CHEMO ALERT CARD at check-in to the Emergency Department and triage nurse.   

## 2019-09-08 NOTE — Progress Notes (Signed)
Labs reviewed with MD today. Proceed as planned. Patient is driving today, will only give 25 of benadryl per MD.   Consent obtained for chemotherapy today.   Treatment given per orders. Patient tolerated it well without problems. Vitals stable and discharged home from clinic ambulatory. Follow up as scheduled.

## 2019-09-09 ENCOUNTER — Other Ambulatory Visit: Payer: Self-pay

## 2019-09-09 ENCOUNTER — Inpatient Hospital Stay (HOSPITAL_COMMUNITY): Payer: Medicare Other

## 2019-09-09 VITALS — BP 151/70 | HR 67 | Temp 97.5°F | Resp 20

## 2019-09-09 DIAGNOSIS — R197 Diarrhea, unspecified: Secondary | ICD-10-CM | POA: Diagnosis not present

## 2019-09-09 DIAGNOSIS — Z5111 Encounter for antineoplastic chemotherapy: Secondary | ICD-10-CM | POA: Diagnosis not present

## 2019-09-09 DIAGNOSIS — G8929 Other chronic pain: Secondary | ICD-10-CM | POA: Diagnosis not present

## 2019-09-09 DIAGNOSIS — Z95828 Presence of other vascular implants and grafts: Secondary | ICD-10-CM

## 2019-09-09 DIAGNOSIS — C7951 Secondary malignant neoplasm of bone: Secondary | ICD-10-CM | POA: Diagnosis not present

## 2019-09-09 DIAGNOSIS — M549 Dorsalgia, unspecified: Secondary | ICD-10-CM | POA: Diagnosis not present

## 2019-09-09 DIAGNOSIS — Z5189 Encounter for other specified aftercare: Secondary | ICD-10-CM | POA: Diagnosis not present

## 2019-09-09 DIAGNOSIS — I10 Essential (primary) hypertension: Secondary | ICD-10-CM | POA: Diagnosis not present

## 2019-09-09 DIAGNOSIS — N189 Chronic kidney disease, unspecified: Secondary | ICD-10-CM | POA: Diagnosis not present

## 2019-09-09 DIAGNOSIS — C61 Malignant neoplasm of prostate: Secondary | ICD-10-CM

## 2019-09-09 MED ORDER — PEGFILGRASTIM INJECTION 6 MG/0.6ML ~~LOC~~
6.0000 mg | PREFILLED_SYRINGE | Freq: Once | SUBCUTANEOUS | Status: AC
  Administered 2019-09-09: 6 mg via SUBCUTANEOUS

## 2019-09-09 NOTE — Patient Instructions (Signed)
[facility]  Discharge Instructions:   _______________________________________________________________  Thank you for choosing [facility] to provide your oncology and hematology care.  To afford each patient quality time with our providers, please arrive at least 15 minutes before your scheduled appointment.  You need to re-schedule your appointment if you arrive 10 or more minutes late.  We strive to give you quality time with our providers, and arriving late affects you and other patients whose appointments are after yours.  Also, if you no show three or more times for appointments you may be dismissed from the clinic.  Again, thank you for choosing [facility] at [facility]. Our hope is that these requests will allow you access to exceptional care and in a timely manner. _______________________________________________________________  If you have questions after your visit, please contact our office at [phone] between the hours of 8:30 a.m. and 5:00 p.m. Voicemails left after 4:30 p.m. will not be returned until the following business day. _______________________________________________________________  For prescription refill requests, have your pharmacy contact our office. _______________________________________________________________  Recommendations made by the consultant and any test results will be sent to your referring physician. _______________________________________________________________ 

## 2019-09-09 NOTE — Progress Notes (Signed)
[name] presents today for injection per MD orders. Neulasta 6mg  administered SQ in left Abdomen. Administration without incident. Patient tolerated well.  No complaints at this time. Discharged from clinic ambulatory. Vital signs within normal limits. F/U with [facility] as scheduled.

## 2019-09-12 ENCOUNTER — Emergency Department (HOSPITAL_COMMUNITY)
Admission: EM | Admit: 2019-09-12 | Discharge: 2019-09-12 | Disposition: A | Discharge status: Home / Self Care | Payer: Medicare Other | Attending: Emergency Medicine | Admitting: Emergency Medicine

## 2019-09-12 ENCOUNTER — Other Ambulatory Visit: Payer: Self-pay

## 2019-09-12 ENCOUNTER — Emergency Department (HOSPITAL_COMMUNITY): Payer: Medicare Other

## 2019-09-12 ENCOUNTER — Encounter (HOSPITAL_COMMUNITY): Payer: Self-pay | Admitting: *Deleted

## 2019-09-12 DIAGNOSIS — Z8546 Personal history of malignant neoplasm of prostate: Secondary | ICD-10-CM | POA: Diagnosis not present

## 2019-09-12 DIAGNOSIS — I10 Essential (primary) hypertension: Secondary | ICD-10-CM | POA: Diagnosis not present

## 2019-09-12 DIAGNOSIS — S99911A Unspecified injury of right ankle, initial encounter: Secondary | ICD-10-CM | POA: Diagnosis present

## 2019-09-12 DIAGNOSIS — W109XXA Fall (on) (from) unspecified stairs and steps, initial encounter: Secondary | ICD-10-CM | POA: Diagnosis not present

## 2019-09-12 DIAGNOSIS — Z79899 Other long term (current) drug therapy: Secondary | ICD-10-CM | POA: Insufficient documentation

## 2019-09-12 DIAGNOSIS — S82891A Other fracture of right lower leg, initial encounter for closed fracture: Secondary | ICD-10-CM | POA: Insufficient documentation

## 2019-09-12 DIAGNOSIS — Y998 Other external cause status: Secondary | ICD-10-CM | POA: Diagnosis not present

## 2019-09-12 DIAGNOSIS — M25571 Pain in right ankle and joints of right foot: Secondary | ICD-10-CM | POA: Diagnosis not present

## 2019-09-12 DIAGNOSIS — S8261XA Displaced fracture of lateral malleolus of right fibula, initial encounter for closed fracture: Secondary | ICD-10-CM | POA: Diagnosis not present

## 2019-09-12 DIAGNOSIS — E785 Hyperlipidemia, unspecified: Secondary | ICD-10-CM | POA: Insufficient documentation

## 2019-09-12 DIAGNOSIS — Y9289 Other specified places as the place of occurrence of the external cause: Secondary | ICD-10-CM | POA: Diagnosis not present

## 2019-09-12 DIAGNOSIS — Y939 Activity, unspecified: Secondary | ICD-10-CM | POA: Diagnosis not present

## 2019-09-12 DIAGNOSIS — Z87891 Personal history of nicotine dependence: Secondary | ICD-10-CM | POA: Insufficient documentation

## 2019-09-12 NOTE — ED Provider Notes (Signed)
[facility] EMERGENCY DEPARTMENT Provider Note   CSN: [id number] Arrival date & time: 09/12/19  2048     History   Chief Complaint Chief Complaint  Patient presents with  . Ankle Pain    HPI [name] is a 78 y.o. male.     HPI   [name] is a 78 y.o. male with PMH hyperlipidemia, hypertension, prostate cancer with metastasis to the bone.  He  presents to the Emergency Department complaining of right ankle pain secondary to a mechanical fall that occurred this evening.  He states that he fell while going down some steps today twisting his right ankle.  He complains of pain all the way around his ankle that is worse with movement or weightbearing.  He notes some swelling of his ankle.  He denies head injury, LOC, neck or back pain, knee or hip pain.      Past Medical History:  Diagnosis Date  . Cancer [facility])    prostate  . Family history of breast cancer   . Family history of prostate cancer   . GERD (gastroesophageal reflux disease)   . HOH (hard of hearing)   . Hyperlipidemia   . Hypertension   . Port-A-Cath in place 08/29/2019  . Prostate cancer metastatic to bone ([place]) 04/04/2016  . Sleep apnea 10/24/2016    Patient Active Problem List   Diagnosis Date Noted  . Port-A-Cath in place 08/29/2019  . Goals of care, counseling/discussion 08/25/2019  . Genetic testing 07/14/2019  . Family history of prostate cancer   . Family history of breast cancer   . Tubular adenoma of colon 12/03/2016  . Morbid obesity ([place]) 10/24/2016  . Essential hypertension 10/24/2016  . HLD (hyperlipidemia) 10/24/2016  . 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 Chronic back pain 10/24/2016  . PUD (peptic ulcer disease) 10/24/2016  . GERD (gastroesophageal reflux disease) 10/24/2016  . Hemorrhoids 10/24/2016  . Sleep apnea 10/24/2016  . Uncontrolled daytime somnolence 10/24/2016  . HOH (hard of hearing) 10/24/2016  . Testicular discomfort 05/07/2016  .  Prostate cancer metastatic to bone [facility]) 04/04/2016    Past Surgical History:  Procedure Laterality Date  . CATARACT EXTRACTION W/PHACO Right 04/22/2019   Procedure: CATARACT EXTRACTION PHACO AND INTRAOCULAR LENS PLACEMENT RIGHT EYE;  Surgeon: Baruch Goldmann, MD;  Location: [facility] ORS;  Service: Ophthalmology;  Laterality: Right;  right  . CATARACT EXTRACTION W/PHACO Left 05/13/2019   Procedure: CATARACT EXTRACTION PHACO AND INTRAOCULAR LENS PLACEMENT LEFT EYE;  Surgeon: Baruch Goldmann, MD;  Location: [facility] ORS;  Service: Ophthalmology;  Laterality: Left;  left  . CHOLECYSTECTOMY    . CYSTOSCOPY W/ URETERAL STENT PLACEMENT Left 08/17/2019   Procedure: CYSTOSCOPY WITH RETROGRADE PYELOGRAM/URETERAL STENT PLACEMENT;  Surgeon: Cleon Gustin, MD;  Location: [facility] ORS;  Service: Urology;  Laterality: Left;  . HAND SURGERY    . INGUINAL HERNIA REPAIR Left   . PORTACATH PLACEMENT Left 09/07/2019   Procedure: INSERTION PORT-A-CATH;  Surgeon: Virl Cagey, MD;  Location: [facility] ORS;  Service: General;  Laterality: Left;  . PROSTATECTOMY    . PROSTATECTOMY    . radical prostatectomy    . TONSILLECTOMY          Home Medications    Prior to Admission medications   Medication Sig Start Date End Date Taking? Authorizing Provider  benazepril (LOTENSIN) 20 MG tablet TAKE ONE TABLET BY MOUTH DAILY. 08/14/17   [name], MD  calcium-vitamin D (OSCAL WITH D) [phone]  MG-UNIT TABS tablet Take 1 tablet by mouth daily. 07/19/18   [provider]  cetirizine (ZYRTEC) 10 MG tablet TAKE ONE TABLET BY MOUTH DAILY. 11/17/17   [name], MD  clotrimazole-betamethasone (LOTRISONE) cream Apply 1 application topically 2 (two) times daily. 07/09/17   [name], MD  DOCETAXEL IV Inject into the vein every 21 ( twenty-one) days. 08/26/19   [provider]  Ferrous Sulfate (IRON) 325 (65 Fe) MG TABS Take 1 tablet by mouth daily. 01/21/19   [provider]  fexofenadine  (ALLEGRA) 180 MG tablet Take 180 mg by mouth daily. 04/02/18   [provider]  fluticasone (FLONASE) 50 MCG/ACT nasal spray USE ONE SPRAY IN EACH NOSTRIL ONCE DAILY. SHAKE GENTLY BEFORE USING. 12/15/18   [provider]  furosemide (LASIX) 40 MG tablet TAKE ONE TABLET BY MOUTH DAILY. TAKE WITH POTASSIUM. 05/21/18   [provider]  hydrochlorothiazide (HYDRODIURIL) 25 MG tablet TAKE ONE TABLET BY MOUTH DAILY. 08/14/17   [name], MD  ILEVRO 0.3 % ophthalmic suspension INSTILL ONE DROP IN THE OPERATIVE EYE DAILY. START TWO DAYS BEFORE SURGERY AND CONTINUE AS DIRECTED. 01/07/19   [provider]  ketoconazole (NIZORAL) 2 % cream APPLY A SMALL AMOUNT TO FACE RASH TWICE DAILY FOR UP TO FOUR WEEKS IF NEEDED. 09/16/18   [provider]  lidocaine-prilocaine (EMLA) cream Apply small amount to port a cath site and cover with plastic wrap 1 hour prior to chemotherapy appointments 08/16/19   Derek Jack, MD  losartan (COZAAR) 100 MG tablet Take 100 mg by mouth daily. 04/02/18   [provider]  meloxicam (MOBIC) 7.5 MG tablet Take 1 tablet (7.5 mg total) daily by mouth. 09/11/17   [name], MD  methylphenidate (RITALIN) 20 MG tablet Take 20 mg by mouth daily. 07/03/18   [provider]  mirabegron ER (MYRBETRIQ) 25 MG TB24 tablet Take by mouth.    [provider]  moxifloxacin (VIGAMOX) 0.5 % ophthalmic solution INSTILL ONE DROP THREE TIMES DAILY IN THE OPERATIVE EYE. START TWO DAYS BEFORE SURGERY AND CONTINUE AS DIRECTED. 01/07/19   [provider]  potassium chloride (KLOR-CON) 20 MEQ packet Take by mouth.    [provider]  prednisoLONE acetate (PRED FORTE) 1 % ophthalmic suspension INSTILL ONE DROP IN THE OPERATIVE EYE THREE TIMES DAILY. START ONE DAY AFTER SURGERY AND CONTINUE AS DIRECTED. 01/07/19   [provider]  prochlorperazine (COMPAZINE) 10 MG tablet Take 1 tablet (10 mg total) by  mouth every 6 (six) hours as needed for nausea or vomiting. 08/16/19   Derek Jack, MD  selenium sulfide (SELSUN) 2.5 % shampoo Apply 1 application topically daily as needed for irritation. 03/11/17   [name], MD  traMADol (ULTRAM) 50 MG tablet Take 1 tablet (50 mg total) by mouth every 6 (six) hours as needed. 08/17/19 08/16/20  Cleon Gustin, MD    Family History Family History  Problem Relation Age of Onset  . Heart failure Mother        CHF  . Cancer Mother        unknown  . Prostate cancer Father   . Narcolepsy Sister   . Cancer Daughter        unknown  . Breast cancer Maternal Aunt   . Cancer Maternal Grandmother        unknown  . Cancer Maternal Aunt        unknown type of cancer  Social History Social History   Tobacco Use  . Smoking status: Former Smoker    Packs/day: 1.00    Years: 32.00    Pack years: 32.00    Types: Cigarettes    Quit date: 02/04/1989    Years since quitting: 30.6  . Smokeless tobacco: Never Used  Substance Use Topics  . Alcohol use: No  . Drug use: No     Allergies   Patient has no known allergies.   Review of Systems Review of Systems  Constitutional: Negative for chills and fever.  Respiratory: Negative for shortness of breath.   Cardiovascular: Negative for chest pain.  Gastrointestinal: Negative for abdominal pain, nausea and vomiting.  Musculoskeletal: Positive for arthralgias (Right ankle pain and swelling) and joint swelling. Negative for back pain and neck pain.  Skin: Negative for color change and wound.  Neurological: Negative for dizziness, syncope, weakness and headaches.     Physical Exam Updated Vital Signs BP (!) 159/74   Pulse 92   Temp 99.8 F (37.7 C) (Oral)   Resp 18   Ht 5' 9.5" (1.765 m)   Wt 122.5 kg   SpO2 99%   BMI 39.30 kg/m   Physical Exam Vitals signs and nursing note reviewed.  Constitutional:      Appearance: Normal appearance. He is not ill-appearing.  HENT:      Head: Atraumatic.  Cardiovascular:     Rate and Rhythm: Normal rate and regular rhythm.     Pulses: Normal pulses.  Pulmonary:     Effort: Pulmonary effort is normal.     Breath sounds: Normal breath sounds.  Chest:     Chest wall: No tenderness.  Abdominal:     General: There is no distension.     Palpations: Abdomen is soft.     Tenderness: There is no abdominal tenderness.  Musculoskeletal: Normal range of motion.        General: Swelling, tenderness (Tenderness to palpation of the medial and lateral malleolus of the right ankle.  No bony deformity.  Mild edema noted.  No proximal tenderness.  Right foot is nontender.) and signs of injury present.  Skin:    General: Skin is warm.     Capillary Refill: Capillary refill takes less than 2 seconds.     Findings: No erythema or rash.  Neurological:     General: No focal deficit present.     Mental Status: He is alert.     Sensory: No sensory deficit.     Motor: No weakness.      ED Treatments / Results  Labs (all labs ordered are listed, but only abnormal results are displayed) Labs Reviewed - No data to display  EKG None  Radiology Dg Ankle Complete Right  Result Date: 09/12/2019 CLINICAL DATA:  Right ankle pain after fall down steps today. EXAM: RIGHT ANKLE - COMPLETE 3+ VIEW COMPARISON:  None. FINDINGS: Small curvilinear osseous fragment adjacent to the anterior dorsal talus suspicious for avulsion injury, possibly acute. There is adjacent soft tissue edema. No other acute fracture. Subchondral sclerosis involving the medial aspect of the talus is nonspecific but may represent an osteochondral lesion. Generalized soft tissue edema, slightly more focal over the anterior ankle. Plantar calcaneal spur. IMPRESSION: 1. Small curvilinear osseous fragment adjacent to the anterior dorsal talus suspicious for avulsion injury, age indeterminate possibly acute given adjacent soft tissue edema. 2. Subchondral sclerosis involving the  medial aspect of the talar dome is nonspecific but may represent an osteochondral lesion,  appears chronic. Electronically Signed   By: Keith Rake M.D.   On: 09/12/2019 22:11      Procedures Procedures (including critical care time)  Medications Ordered in ED Medications - No data to display   Initial Impression / Assessment and Plan / ED Course  I have reviewed the triage vital signs and the nursing notes.  Pertinent labs & imaging results that were available during my care of the patient were reviewed by me and considered in my medical decision making (see chart for details).       Patient with right ankle pain secondary to mechanical fall.  Neurovascularly intact.  XR shows avulsion fx.  Discussed findings with pt and he agrees to tx plan  ASO applied, pt agrees to elevate, ice, minimal wt bearing and close orthopedic f/u with local orthopedics at his request.    Final Clinical Impressions(s) / ED Diagnoses   Final diagnoses:  Closed fracture of right ankle, initial encounter    ED Discharge Orders    None       Kem Parkinson, PA-C 09/14/19 1241    Milton Ferguson, MD 09/15/19 [id number]

## 2019-09-12 NOTE — ED Triage Notes (Signed)
Pt c/o right ankle pain after falling down some steps today

## 2019-09-12 NOTE — Discharge Instructions (Addendum)
Elevate and apply ice packs on and off to your ankle.  Wear the brace until you are seen by orthopedics.  Contact one of the providers listed to arrange a follow-up appointment.

## 2019-09-13 ENCOUNTER — Telehealth: Payer: Self-pay | Admitting: Orthopedic Surgery

## 2019-09-13 NOTE — Telephone Encounter (Signed)
Patient called, also left message; said spoke with someone here yesterday. In my checking with other staff, no one here today had noted speaking with patient yesterday; patient then hung up. I called back to patient to discuss appointment; he seemed to not voice understanding. Will check for designated contact. Also heard immediately afterward from patient's [facility] contact; aware we are trying to set up appointment following emergency room visit.

## 2019-09-14 ENCOUNTER — Encounter (HOSPITAL_COMMUNITY): Payer: Self-pay | Admitting: Hematology

## 2019-09-14 ENCOUNTER — Other Ambulatory Visit: Payer: Self-pay

## 2019-09-14 ENCOUNTER — Ambulatory Visit: Payer: Medicare Other | Admitting: Urology

## 2019-09-14 ENCOUNTER — Inpatient Hospital Stay (HOSPITAL_BASED_OUTPATIENT_CLINIC_OR_DEPARTMENT_OTHER): Payer: Medicare Other | Admitting: Hematology

## 2019-09-14 ENCOUNTER — Telehealth: Payer: Self-pay | Admitting: Orthopedic Surgery

## 2019-09-14 ENCOUNTER — Inpatient Hospital Stay (HOSPITAL_COMMUNITY): Payer: Medicare Other

## 2019-09-14 DIAGNOSIS — M549 Dorsalgia, unspecified: Secondary | ICD-10-CM | POA: Diagnosis not present

## 2019-09-14 DIAGNOSIS — Z5189 Encounter for other specified aftercare: Secondary | ICD-10-CM | POA: Diagnosis not present

## 2019-09-14 DIAGNOSIS — C61 Malignant neoplasm of prostate: Secondary | ICD-10-CM | POA: Diagnosis not present

## 2019-09-14 DIAGNOSIS — C7951 Secondary malignant neoplasm of bone: Secondary | ICD-10-CM

## 2019-09-14 DIAGNOSIS — N189 Chronic kidney disease, unspecified: Secondary | ICD-10-CM | POA: Diagnosis not present

## 2019-09-14 DIAGNOSIS — R197 Diarrhea, unspecified: Secondary | ICD-10-CM | POA: Diagnosis not present

## 2019-09-14 DIAGNOSIS — G8929 Other chronic pain: Secondary | ICD-10-CM | POA: Diagnosis not present

## 2019-09-14 DIAGNOSIS — Z5111 Encounter for antineoplastic chemotherapy: Secondary | ICD-10-CM | POA: Diagnosis not present

## 2019-09-14 DIAGNOSIS — I10 Essential (primary) hypertension: Secondary | ICD-10-CM | POA: Diagnosis not present

## 2019-09-14 LAB — CBC WITH DIFFERENTIAL/PLATELET
Abs Immature Granulocytes: 0.59 10*3/uL — ABNORMAL HIGH (ref 0.00–0.07)
Basophils Absolute: 0.1 10*3/uL (ref 0.0–0.1)
Basophils Relative: 1 %
Eosinophils Absolute: 0.1 10*3/uL (ref 0.0–0.5)
Eosinophils Relative: 1 %
HCT: 35.9 % — ABNORMAL LOW (ref 39.0–52.0)
Hemoglobin: 11.3 g/dL — ABNORMAL LOW (ref 13.0–17.0)
Immature Granulocytes: 7 %
Lymphocytes Relative: 17 %
Lymphs Abs: 1.4 10*3/uL (ref 0.7–4.0)
MCH: 28.5 pg (ref 26.0–34.0)
MCHC: 31.5 g/dL (ref 30.0–36.0)
MCV: 90.7 fL (ref 80.0–100.0)
Monocytes Absolute: 0.8 10*3/uL (ref 0.1–1.0)
Monocytes Relative: 10 %
Neutro Abs: 5.2 10*3/uL (ref 1.7–7.7)
Neutrophils Relative %: 64 %
Platelets: 158 10*3/uL (ref 150–400)
RBC: 3.96 MIL/uL — ABNORMAL LOW (ref 4.22–5.81)
RDW: 14.7 % (ref 11.5–15.5)
WBC: 8.3 10*3/uL (ref 4.0–10.5)
nRBC: 0 % (ref 0.0–0.2)

## 2019-09-14 LAB — COMPREHENSIVE METABOLIC PANEL
ALT: 16 U/L (ref 0–44)
AST: 21 U/L (ref 15–41)
Albumin: 3.6 g/dL (ref 3.5–5.0)
Alkaline Phosphatase: 80 U/L (ref 38–126)
Anion gap: 9 (ref 5–15)
BUN: 20 mg/dL (ref 8–23)
CO2: 27 mmol/L (ref 22–32)
Calcium: 9.2 mg/dL (ref 8.9–10.3)
Chloride: 103 mmol/L (ref 98–111)
Creatinine, Ser: 2.11 mg/dL — ABNORMAL HIGH (ref 0.61–1.24)
GFR calc Af Amer: 33 mL/min — ABNORMAL LOW (ref >60)
GFR calc non Af Amer: 29 mL/min — ABNORMAL LOW (ref >60)
Glucose, Bld: 107 mg/dL — ABNORMAL HIGH (ref 70–99)
Potassium: 4.1 mmol/L (ref 3.5–5.1)
Sodium: 139 mmol/L (ref 135–145)
Total Bilirubin: 0.6 mg/dL (ref 0.3–1.2)
Total Protein: 7.1 g/dL (ref 6.5–8.1)

## 2019-09-14 NOTE — Telephone Encounter (Signed)
-----   Message from [place], RT sent at 09/14/2019  8:26 AM EST ----- Regarding: RE: Er-related I think ok to schedule here or in [facility] with Dr Luna Glasgow next week if available.   If patient is not understanding you then speak with the [facility] contact and see if they can help communicate appt info. MW is on AVS because Dr Fredonia Highland was on call for [facility] unassigned on 11/16.  But we can see if patient wants.  See what this contact can do to help.   ----- Message ----- From: Uvaldo Bristle Sent: 09/13/2019   5:28 PM EST To: Wendy May, RT Subject: Er-related                                     Abigail Butts,  I entered a phone note for patient PO:[id number], [facility] ER, 11/16 (Fx ankle) - ([facility] appears first on pt's AVS, and 2ndly, Dr Aline Brochure. Pt is not voicing understanding when I identify as our office - he stated he is trying to call [phone]. Ive also tried to reach pt's 1st alternate contact, left message.  Also based on clinic schedule here, and pt living in [place], please advise.  Pt also has a [facility] contact who has reached out to patient, and has called here to inquire about his appointment.  Thank you

## 2019-09-14 NOTE — Telephone Encounter (Signed)
Update: No contact information on [facility] nurse who called. No further response from patient, or from his first contact. Have faxed a note to patient's primary care, Dr Celene Squibb.

## 2019-09-14 NOTE — Assessment & Plan Note (Signed)
1.  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 -Enzalutamide 160 mg daily started towards the end of November 2019 and discontinued in September 2020 upon progression. 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  No evidence of skeletal metastasis.  Chronic compression fracture of T8. -Germline mutation testing was negative.  Guardant 360 did not show any FDA approved therapies. -Bone scan on 07/27/2019 shows uptake at T8.  No other uptake.  PSA today 16.8. -Cycle 1 of chemotherapy with docetaxel 50 mg/m2 with Neulasta support on 09/08/2019. -He did not experience any nausea or vomiting.  He had mild diarrhea. -He fell on 09/12/2019 and presented to the ER.  X-ray of the right ankle showed small evulsion fracture of the anterior dorsal talus.  He has follow-up with orthopedics. -We reviewed his blood work.  They are grossly within normal limits. -We will see him back in 2 weeks for his next cycle.  2.  Bone strengthening: -He will continue denosumab started on 03/05/2017.  He will continue calcium and vitamin D.  3.  CKD: -Creatinine increased to 2.11, partly because of hydronephrosis.

## 2019-09-14 NOTE — Patient Instructions (Addendum)
[facility] Discharge Instructions  You were seen today by Dr. Katragadda. He went over your recent lab results. He will see you back in 2 weeks for labs, treatment and follow up.   Thank you for choosing [facility] to provide your oncology and hematology care.  To afford each patient quality time with our provider, please arrive at least 15 minutes before your scheduled appointment time.   If you have a lab appointment with the Cancer Center please come in thru the  Main Entrance and check in at the main information desk  You need to re-schedule your appointment should you arrive 10 or more minutes late.  We strive to give you quality time with our providers, and arriving late affects you and other patients whose appointments are after yours.  Also, if you no show three or more times for appointments you may be dismissed from the clinic at the providers discretion.     Again, thank you for choosing [facility].  Our hope is that these requests will decrease the amount of time that you wait before being seen by our physicians.       _____________________________________________________________  Should you have questions after your visit to  Cancer Center, please contact our office at [phone] between the hours of 8:00 a.m. and 4:30 p.m.  Voicemails left after 4:00 p.m. will not be returned until the following business day.  For prescription refill requests, have your pharmacy contact our office and allow 72 hours.    Cancer Center Support Programs:   > Cancer Support Group  2nd Tuesday of the month 1pm-2pm, Journey Room    

## 2019-09-14 NOTE — Progress Notes (Signed)
[name], [place] [id number]   CLINIC:  Medical Oncology/Hematology  PCP:  Celene Squibb, MD [place] [address] [phone]   REASON FOR VISIT:  Follow-up for metastatic prostate cancer    INTERVAL HISTORY:  [name] 78 y.o. 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  He received first cycle of docetaxel, dose reduced on 09/16/2019.  He was walking and apparently he fell on 09/12/2019 and went to the emergency room.  X-ray of the right ankle showed small avulsion fracture of the anterior dorsal talus.  Swelling is improving.  He has not been seen by orthopedics yet.  Appetite is 50%.  Energy levels are 75%.  He had mild nausea after chemotherapy which subsided.  Denied any vomiting.  Complained of cramping in the fingers.  Had on and off episodes of diarrhea.  Denies any fevers or chills.  REVIEW OF SYSTEMS:  Review of Systems  Gastrointestinal: Positive for diarrhea.  All other systems reviewed and are negative.    PAST MEDICAL/SURGICAL HISTORY:  Past Medical History:  Diagnosis Date  . Cancer [facility])    prostate  . Family history of breast cancer   . Family history of prostate cancer   . GERD (gastroesophageal reflux disease)   . HOH (hard of hearing)   . Hyperlipidemia   . Hypertension   . Port-A-Cath in place 08/29/2019  . Prostate cancer metastatic to bone ([place]) 04/04/2016  . Sleep apnea 10/24/2016   Past Surgical History:  Procedure Laterality Date  . CATARACT EXTRACTION W/PHACO Right 04/22/2019   Procedure: CATARACT EXTRACTION PHACO AND INTRAOCULAR LENS PLACEMENT RIGHT EYE;  Surgeon: Baruch Goldmann, MD;  Location: [facility];  Service: Ophthalmology;  Laterality: Right;  right  . CATARACT EXTRACTION W/PHACO Left 05/13/2019   Procedure: CATARACT EXTRACTION PHACO AND INTRAOCULAR LENS PLACEMENT LEFT EYE;  Surgeon: Baruch Goldmann, MD;  Location: [facility];  Service:  Ophthalmology;  Laterality: Left;  left  . CHOLECYSTECTOMY    . CYSTOSCOPY W/ URETERAL STENT PLACEMENT Left 08/17/2019   Procedure: CYSTOSCOPY WITH RETROGRADE PYELOGRAM/URETERAL STENT PLACEMENT;  Surgeon: Cleon Gustin, MD;  Location: [facility];  Service: Urology;  Laterality: Left;  . HAND SURGERY    . INGUINAL HERNIA REPAIR Left   . PORTACATH PLACEMENT Left 09/07/2019   Procedure: INSERTION PORT-A-CATH;  Surgeon: Virl Cagey, MD;  Location: [facility];  Service: General;  Laterality: Left;  . PROSTATECTOMY    . PROSTATECTOMY    . radical prostatectomy    . TONSILLECTOMY       SOCIAL HISTORY:  Social History   Socioeconomic History  . Marital status: Widowed    Spouse name: Not on file  . Number of children: 3  . Years of education: 9  . Highest education level: Not on file  Occupational History  . Occupation: Animator- retired    Comment: [employer]  Social Needs  . Financial resource strain: Not on file  . Food insecurity    Worry: Not on file    Inability: Not on file  . Transportation needs    Medical: Not on file    Non-medical: Not on file  Tobacco Use  . Smoking status: Former Smoker    Packs/day: 1.00    Years: 32.00    Pack years: 32.00    Types: Cigarettes    Quit date: 02/04/1989    Years since quitting: 30.6  .  Smokeless tobacco: Never Used  Substance and Sexual Activity  . Alcohol use: No  . Drug use: No  . Sexual activity: Not Currently  Lifestyle  . Physical activity    Days per week: Not on file    Minutes per session: Not on file  . Stress: Not on file  Relationships  . Social Herbalist on phone: Not on file    Gets together: Not on file    Attends religious service: Not on file    Active member of club or organization: Not on file    Attends meetings of clubs or organizations: Not on file    Relationship status: Not on file  . Intimate partner violence    Fear of current or ex partner: Not on file    Emotionally  abused: Not on file    Physically abused: Not on file    Forced sexual activity: Not on file  Other Topics Concern  . 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 Heart failure Mother        CHF  . Cancer Mother        unknown  . Prostate cancer Father   . Narcolepsy Sister   . Cancer Daughter        unknown  . Breast cancer Maternal Aunt   . Cancer Maternal Grandmother        unknown  . Cancer Maternal Aunt        unknown type of cancer    CURRENT MEDICATIONS:  Outpatient Encounter Medications as of 09/14/2019  Medication Sig Note  . benazepril (LOTENSIN) 20 MG tablet TAKE ONE TABLET BY MOUTH DAILY.   . calcium-vitamin D (OSCAL WITH D) 500-200 MG-UNIT TABS tablet Take 1 tablet by mouth daily.   . cetirizine (ZYRTEC) 10 MG tablet TAKE ONE TABLET BY MOUTH DAILY.   . clotrimazole-betamethasone (LOTRISONE) cream Apply 1 application topically 2 (two) times daily.   . DOCETAXEL IV Inject into the vein every 21 ( twenty-one) days.   . Ferrous Sulfate (IRON) 325 (65 Fe) MG TABS Take 1 tablet by mouth daily.   . fexofenadine (ALLEGRA) 180 MG tablet Take 180 mg by mouth daily. 11/02/2018: Not sure which allergy medication he is currently taking   . fluticasone (FLONASE) 50 MCG/ACT nasal spray USE ONE SPRAY IN EACH NOSTRIL ONCE DAILY. SHAKE GENTLY BEFORE USING.   . furosemide (LASIX) 40 MG tablet TAKE ONE TABLET BY MOUTH DAILY. TAKE WITH POTASSIUM.   Marland Kitchen hydrochlorothiazide (HYDRODIURIL) 25 MG tablet TAKE ONE TABLET BY MOUTH DAILY.   Marland Kitchen ketoconazole (NIZORAL) 2 % cream APPLY A SMALL AMOUNT TO FACE RASH TWICE DAILY FOR UP TO FOUR WEEKS IF NEEDED.   Marland Kitchen losartan (COZAAR) 100 MG tablet Take 100 mg by mouth daily.   . meloxicam (MOBIC) 7.5 MG tablet Take 1 tablet (7.5 mg total) daily by mouth.   . methylphenidate (RITALIN) 20 MG tablet Take 20 mg by mouth daily.   . mirabegron ER  (MYRBETRIQ) 25 MG TB24 tablet Take by mouth.   . potassium chloride (KLOR-CON) 20 MEQ packet Take by mouth.   . prednisoLONE acetate (PRED FORTE) 1 % ophthalmic suspension INSTILL ONE DROP IN THE OPERATIVE EYE THREE TIMES DAILY. START ONE DAY AFTER SURGERY AND CONTINUE AS DIRECTED.   Marland Kitchen ILEVRO 0.3 %  ophthalmic suspension INSTILL ONE DROP IN THE OPERATIVE EYE DAILY. START TWO DAYS BEFORE SURGERY AND CONTINUE AS DIRECTED.   Marland Kitchen lidocaine-prilocaine (EMLA) cream Apply small amount to port a cath site and cover with plastic wrap 1 hour prior to chemotherapy appointments (Patient not taking: Reported on 09/14/2019)   . moxifloxacin (VIGAMOX) 0.5 % ophthalmic solution INSTILL ONE DROP THREE TIMES DAILY IN THE OPERATIVE EYE. START TWO DAYS BEFORE SURGERY AND CONTINUE AS DIRECTED.   Marland Kitchen prochlorperazine (COMPAZINE) 10 MG tablet Take 1 tablet (10 mg total) by mouth every 6 (six) hours as needed for nausea or vomiting. (Patient not taking: Reported on 09/14/2019)   . selenium sulfide (SELSUN) 2.5 % shampoo Apply 1 application topically daily as needed for irritation. (Patient not taking: Reported on 09/14/2019)   . traMADol (ULTRAM) 50 MG tablet Take 1 tablet (50 mg total) by mouth every 6 (six) hours as needed. (Patient not taking: Reported on 09/14/2019)    No facility-administered encounter medications on file as of 09/14/2019.     ALLERGIES:  No Known Allergies   PHYSICAL EXAM:  ECOG Performance status: 1  Vitals:   09/14/19 1132  BP: 112/62  Pulse: 99  Resp: 18  Temp: (!) 97.5 F (36.4 C)  SpO2: 99%   Filed Weights   09/14/19 1132  Weight: 260 lb (117.9 kg)    Physical Exam Vitals signs reviewed.  Constitutional:      Appearance: Normal appearance.  Cardiovascular:     Rate and Rhythm: Normal rate and regular rhythm.     Heart sounds: Normal heart sounds.  Pulmonary:     Effort: Pulmonary effort is normal.     Breath sounds: Normal breath sounds.  Abdominal:     General: There is  no distension.     Palpations: Abdomen is soft. There is no mass.  Musculoskeletal:        General: Swelling present.  Skin:    General: Skin is warm.  Neurological:     General: No focal deficit present.     Mental Status: He is alert and oriented to person, place, and time.  Psychiatric:        Mood and Affect: Mood normal.        Behavior: Behavior normal.      LABORATORY DATA:  I have reviewed the labs as listed.  CBC    Component Value Date/Time   WBC 8.3 09/14/2019 1109   RBC 3.96 (L) 09/14/2019 1109   HGB 11.3 (L) 09/14/2019 1109   HCT 35.9 (L) 09/14/2019 1109   PLT 158 09/14/2019 1109   MCV 90.7 09/14/2019 1109   MCH 28.5 09/14/2019 1109   MCHC 31.5 09/14/2019 1109   RDW 14.7 09/14/2019 1109   LYMPHSABS 1.4 09/14/2019 1109   MONOABS 0.8 09/14/2019 1109   EOSABS 0.1 09/14/2019 1109   BASOSABS 0.1 09/14/2019 1109   CMP Latest Ref Rng & Units 09/14/2019 09/08/2019 08/11/2019  Glucose 70 - 99 mg/dL 107(H) 100(H) 110(H)  BUN 8 - 23 mg/dL 20 16 24(H)  Creatinine 0.61 - 1.24 mg/dL 2.11(H) 1.71(H) 1.86(H)  Sodium 135 - 145 mmol/L 139 138 141  Potassium 3.5 - 5.1 mmol/L 4.1 3.6 4.1  Chloride 98 - 111 mmol/L 103 104 104  CO2 22 - 32 mmol/L 27 24 28   Calcium 8.9 - 10.3 mg/dL 9.2 9.1 9.4  Total Protein 6.5 - 8.1 g/dL 7.1 6.7 7.4  Total Bilirubin 0.3 - 1.2 mg/dL 0.6 0.6 0.7  Alkaline Phos 38 -  126 U/L 80 65 61  AST 15 - 41 U/L 21 22 23   ALT 0 - 44 U/L 16 12 14        DIAGNOSTIC IMAGING:  I have independently reviewed the scans and discussed with the patient.   I have reviewed Venita Lick LPN's note and agree with the documentation.  I personally performed a face-to-face visit, made revisions and my assessment and plan is as follows.    ASSESSMENT & PLAN:   Prostate cancer metastatic to bone ([place]) 1.  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 -Enzalutamide 160 mg daily started towards the end of November 2019 and discontinued in September 2020 upon progression. 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  No evidence of skeletal metastasis.  Chronic compression fracture of T8. -Germline mutation testing was negative.  Guardant 360 did not show any FDA approved therapies. -Bone scan on 07/27/2019 shows uptake at T8.  No other uptake.  PSA today 16.8. -Cycle 1 of chemotherapy with docetaxel 50 mg/m2 with Neulasta support on 09/08/2019. -He did not experience any nausea or vomiting.  He had mild diarrhea. -He fell on 09/12/2019 and presented to the ER.  X-ray of the right ankle showed small evulsion fracture of the anterior dorsal talus.  He has follow-up with orthopedics. -We reviewed his blood work.  They are grossly within normal limits. -We will see him back in 2 weeks for his next cycle.  2.  Bone strengthening: -He will continue denosumab started on 03/05/2017.  He will continue calcium and vitamin D.  3.  CKD: -Creatinine increased to 2.11, partly because of hydronephrosis.   Orders placed this encounter:  No orders of the defined types were placed in this encounter.     Derek Jack, MD [place] [phone]

## 2019-09-16 ENCOUNTER — Other Ambulatory Visit: Payer: Self-pay | Admitting: *Deleted

## 2019-09-16 NOTE — Patient Outreach (Signed)
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  Pt states he has all medications and taking as prescribed, states he fell once in November and hurt his ankle, pt states " I"ve got everything down pat"   Pt left the phone conversation for awhile and then ask RN CM to help him with car insurance questions.  RN CM completed screening. RN CM mailed successful outreach letter with pamphlet and 24 hour nurse line magnet to pt home.  Case made inactive.  Jacqlyn Larsen [facility], [place] Coordinator [phone]

## 2019-09-19 ENCOUNTER — Other Ambulatory Visit (HOSPITAL_COMMUNITY): Payer: Medicare Other

## 2019-09-19 ENCOUNTER — Ambulatory Visit (HOSPITAL_COMMUNITY): Payer: Medicare Other | Admitting: Hematology

## 2019-09-19 ENCOUNTER — Ambulatory Visit (HOSPITAL_COMMUNITY): Payer: Medicare Other

## 2019-09-19 DIAGNOSIS — R600 Localized edema: Secondary | ICD-10-CM | POA: Diagnosis not present

## 2019-09-19 DIAGNOSIS — I10 Essential (primary) hypertension: Secondary | ICD-10-CM | POA: Diagnosis not present

## 2019-09-20 ENCOUNTER — Ambulatory Visit (HOSPITAL_COMMUNITY): Payer: Medicare Other | Admitting: Hematology

## 2019-09-20 ENCOUNTER — Other Ambulatory Visit (HOSPITAL_COMMUNITY): Payer: Medicare Other

## 2019-09-20 ENCOUNTER — Ambulatory Visit (HOSPITAL_COMMUNITY): Payer: Medicare Other

## 2019-09-29 ENCOUNTER — Ambulatory Visit (HOSPITAL_COMMUNITY): Payer: Medicare Other

## 2019-09-29 ENCOUNTER — Other Ambulatory Visit (HOSPITAL_COMMUNITY): Payer: Medicare Other

## 2019-09-29 ENCOUNTER — Ambulatory Visit (HOSPITAL_COMMUNITY): Payer: Medicare Other | Admitting: Hematology

## 2019-09-30 ENCOUNTER — Other Ambulatory Visit: Payer: Self-pay

## 2019-09-30 ENCOUNTER — Ambulatory Visit (HOSPITAL_COMMUNITY): Payer: Medicare Other

## 2019-09-30 ENCOUNTER — Inpatient Hospital Stay (HOSPITAL_COMMUNITY): Payer: Medicare Other | Attending: Hematology

## 2019-09-30 ENCOUNTER — Inpatient Hospital Stay (HOSPITAL_COMMUNITY): Payer: Medicare Other

## 2019-09-30 ENCOUNTER — Ambulatory Visit (HOSPITAL_COMMUNITY)
Admission: RE | Admit: 2019-09-30 | Discharge: 2019-09-30 | Disposition: A | Discharge status: Home / Self Care | Payer: Medicare Other | Source: Ambulatory Visit | Attending: Hematology | Admitting: Hematology

## 2019-09-30 ENCOUNTER — Inpatient Hospital Stay (HOSPITAL_BASED_OUTPATIENT_CLINIC_OR_DEPARTMENT_OTHER): Payer: Medicare Other | Admitting: Hematology

## 2019-09-30 VITALS — BP 147/68 | HR 68 | Temp 96.8°F | Resp 18 | Wt 266.1 lb

## 2019-09-30 VITALS — BP 138/66 | HR 74 | Temp 97.5°F | Resp 18 | Wt 266.1 lb

## 2019-09-30 DIAGNOSIS — C61 Malignant neoplasm of prostate: Secondary | ICD-10-CM | POA: Insufficient documentation

## 2019-09-30 DIAGNOSIS — R112 Nausea with vomiting, unspecified: Secondary | ICD-10-CM

## 2019-09-30 DIAGNOSIS — Z95828 Presence of other vascular implants and grafts: Secondary | ICD-10-CM

## 2019-09-30 DIAGNOSIS — Z452 Encounter for adjustment and management of vascular access device: Secondary | ICD-10-CM | POA: Diagnosis not present

## 2019-09-30 DIAGNOSIS — Z5189 Encounter for other specified aftercare: Secondary | ICD-10-CM | POA: Insufficient documentation

## 2019-09-30 DIAGNOSIS — Z5111 Encounter for antineoplastic chemotherapy: Secondary | ICD-10-CM | POA: Insufficient documentation

## 2019-09-30 DIAGNOSIS — C7951 Secondary malignant neoplasm of bone: Secondary | ICD-10-CM | POA: Diagnosis not present

## 2019-09-30 LAB — CBC WITH DIFFERENTIAL/PLATELET
Abs Immature Granulocytes: 0.02 10*3/uL (ref 0.00–0.07)
Basophils Absolute: 0 10*3/uL (ref 0.0–0.1)
Basophils Relative: 1 %
Eosinophils Absolute: 0.1 10*3/uL (ref 0.0–0.5)
Eosinophils Relative: 2 %
HCT: 33.9 % — ABNORMAL LOW (ref 39.0–52.0)
Hemoglobin: 10.8 g/dL — ABNORMAL LOW (ref 13.0–17.0)
Immature Granulocytes: 0 %
Lymphocytes Relative: 29 %
Lymphs Abs: 1.9 10*3/uL (ref 0.7–4.0)
MCH: 28.5 pg (ref 26.0–34.0)
MCHC: 31.9 g/dL (ref 30.0–36.0)
MCV: 89.4 fL (ref 80.0–100.0)
Monocytes Absolute: 0.4 10*3/uL (ref 0.1–1.0)
Monocytes Relative: 5 %
Neutro Abs: 4.2 10*3/uL (ref 1.7–7.7)
Neutrophils Relative %: 63 %
Platelets: 264 10*3/uL (ref 150–400)
RBC: 3.79 MIL/uL — ABNORMAL LOW (ref 4.22–5.81)
RDW: 15.1 % (ref 11.5–15.5)
WBC: 6.6 10*3/uL (ref 4.0–10.5)
nRBC: 0 % (ref 0.0–0.2)

## 2019-09-30 LAB — COMPREHENSIVE METABOLIC PANEL
ALT: 25 U/L (ref 0–44)
AST: 33 U/L (ref 15–41)
Albumin: 3.8 g/dL (ref 3.5–5.0)
Alkaline Phosphatase: 89 U/L (ref 38–126)
Anion gap: 10 (ref 5–15)
BUN: 18 mg/dL (ref 8–23)
CO2: 22 mmol/L (ref 22–32)
Calcium: 8.9 mg/dL (ref 8.9–10.3)
Chloride: 105 mmol/L (ref 98–111)
Creatinine, Ser: 1.65 mg/dL — ABNORMAL HIGH (ref 0.61–1.24)
GFR calc Af Amer: 45 mL/min — ABNORMAL LOW (ref >60)
GFR calc non Af Amer: 39 mL/min — ABNORMAL LOW (ref >60)
Glucose, Bld: 86 mg/dL (ref 70–99)
Potassium: 4 mmol/L (ref 3.5–5.1)
Sodium: 137 mmol/L (ref 135–145)
Total Bilirubin: 0.6 mg/dL (ref 0.3–1.2)
Total Protein: 7.1 g/dL (ref 6.5–8.1)

## 2019-09-30 MED ORDER — SODIUM CHLORIDE 0.9 % IV SOLN
Freq: Once | INTRAVENOUS | Status: AC
  Administered 2019-09-30: 11:00:00 via INTRAVENOUS

## 2019-09-30 MED ORDER — DIPHENHYDRAMINE HCL 50 MG/ML IJ SOLN
INTRAMUSCULAR | Status: AC
  Filled 2019-09-30: qty 1

## 2019-09-30 MED ORDER — FAMOTIDINE IN NACL 20-0.9 MG/50ML-% IV SOLN
INTRAVENOUS | Status: AC
  Filled 2019-09-30: qty 50

## 2019-09-30 MED ORDER — STERILE WATER FOR INJECTION IJ SOLN
INTRAMUSCULAR | Status: AC
  Filled 2019-09-30: qty 10

## 2019-09-30 MED ORDER — ALTEPLASE 2 MG IJ SOLR
INTRAMUSCULAR | Status: AC
  Filled 2019-09-30: qty 2

## 2019-09-30 MED ORDER — DIPHENHYDRAMINE HCL 50 MG/ML IJ SOLN
25.0000 mg | Freq: Once | INTRAMUSCULAR | Status: AC
  Administered 2019-09-30: 25 mg via INTRAVENOUS

## 2019-09-30 MED ORDER — PROCHLORPERAZINE MALEATE 10 MG PO TABS: 10.0000 mg | ORAL_TABLET | Freq: Four times a day (QID) | ORAL | 0 refills | Status: AC | PRN

## 2019-09-30 MED ORDER — SODIUM CHLORIDE 0.9 % IV SOLN
50.0000 mg/m2 | Freq: Once | INTRAVENOUS | Status: AC
  Administered 2019-09-30: 120 mg via INTRAVENOUS
  Filled 2019-09-30: qty 12

## 2019-09-30 MED ORDER — ALTEPLASE 2 MG IJ SOLR
2.0000 mg | Freq: Once | INTRAMUSCULAR | Status: AC
  Administered 2019-09-30: 2 mg

## 2019-09-30 MED ORDER — SODIUM CHLORIDE 0.9% FLUSH
10.0000 mL | INTRAVENOUS | Status: DC | PRN
  Administered 2019-09-30: 10 mL
  Filled 2019-09-30: qty 10

## 2019-09-30 MED ORDER — HEPARIN SOD (PORK) LOCK FLUSH 100 UNIT/ML IV SOLN
500.0000 [IU] | Freq: Once | INTRAVENOUS | Status: AC | PRN
  Administered 2019-09-30: 500 [IU]

## 2019-09-30 MED ORDER — SODIUM CHLORIDE 0.9 % IV SOLN
Freq: Once | INTRAVENOUS | Status: AC
  Administered 2019-09-30: 11:00:00 via INTRAVENOUS
  Filled 2019-09-30: qty 4

## 2019-09-30 MED ORDER — FAMOTIDINE IN NACL 20-0.9 MG/50ML-% IV SOLN
20.0000 mg | Freq: Once | INTRAVENOUS | Status: AC
  Administered 2019-09-30: 20 mg via INTRAVENOUS

## 2019-09-30 NOTE — Progress Notes (Signed)
To treatment room for oncology follow up and chemotherapy.  No complaints voiced.  No s/s of distress noted.  0845-alteplase inserted into port per policy.  No complaints voiced.  0915-no blood return noted after alteplase inserted into port.  No s/s of distress noted.  1000-Renee Nester, NP, notified of no blood return with alteplase into port with verbal order dye study to be performed.  Peripheral labs drawn.  Xray notified of dye study.  No s/s of distress noted with the patient.    1010-Alteplase with scant amount of blood removed from port and patient sent to xray by wheelchair.  No s/s of distress.    1054-patient returned from xray by wheelchair.   Ser Creatinine 1.65 today.  Ok to treat verbal order Reynolds Bowl, NP.    Patient tolerated chemotherapy with no complaints voiced.  Port site clean and dry with no bruising or swelling noted at site.  Good blood return noted before and after administration of chemotherapy.  Band aid applied.  Patient left ambulatory with VSS and no s/s of distress noted.

## 2019-09-30 NOTE — Progress Notes (Signed)
[name], [name] [address]   CLINIC:  Medical Oncology/Hematology  PCP:  [name] Squibb, MD [place] [address] [phone]   REASON FOR VISIT:  Follow-up for prostate cancer  CURRENT THERAPY: Taxotere  BRIEF ONCOLOGIC HISTORY:  Oncology History  Prostate cancer metastatic to bone ([place])  04/04/2016 Initial Diagnosis   Prostate cancer metastatic to bone ([place])   07/14/2019 Genetic Testing   Negative genetic testing on the common hereditary cancer panel.  The Common Hereditary Gene Panel offered by [facility] includes sequencing and/or deletion duplication testing of the following 48 genes: APC, ATM, AXIN2, BARD1, BMPR1A, BRCA1, BRCA2, BRIP1, CDH1, CDK4, CDKN2A (p14ARF), CDKN2A (p16INK4a), CHEK2, CTNNA1, DICER1, EPCAM (Deletion/duplication testing only), GREM1 (promoter region deletion/duplication testing only), KIT, MEN1, MLH1, MSH2, MSH3, MSH6, MUTYH, NBN, NF1, NHTL1, PALB2, PDGFRA, PMS2, POLD1, POLE, PTEN, RAD50, RAD51C, RAD51D, RNF43, SDHB, SDHC, SDHD, SMAD4, SMARCA4. STK11, TP53, TSC1, TSC2, and VHL.  The following genes were evaluated for sequence changes only: SDHA and HOXB13 c.251G>A variant only. The report date is July 14, 2019.   09/08/2019 -  Chemotherapy   The patient had pegfilgrastim (NEULASTA) injection 6 mg, 6 mg, Subcutaneous, Once, 2 of 4 cycles Administration: 6 mg (09/09/2019) ondansetron (ZOFRAN) 8 mg in sodium chloride 0.9 % 50 mL IVPB, 8 mg (100 % of original dose 8 mg), Intravenous,  Once, 1 of 1 cycle Dose modification: 8 mg (original dose 8 mg, Cycle 1) Administration: 8 mg (09/08/2019) DOCEtaxel (TAXOTERE) 120 mg in sodium chloride 0.9 % 250 mL chemo infusion, 50 mg/m2 = 120 mg (66.7 % of original dose 75 mg/m2), Intravenous,  Once, 2 of 4 cycles Dose modification: 50 mg/m2 (66.7 % of original dose 75 mg/m2, Cycle 1, Reason: Provider Judgment) Administration: 120 mg (09/08/2019) ondansetron  (ZOFRAN) 8 mg, dexamethasone (DECADRON) 10 mg in sodium chloride 0.9 % 50 mL IVPB, , Intravenous,  Once, 1 of 3 cycles Administration:  (09/30/2019)  for chemotherapy treatment.         INTERVAL HISTORY:  [name] 78 y.o. male presents today for follow-up.  He reports overall doing well.  Denies any significant fatigue.  Denies any fevers, chills, night sweats.  Appetite is stable.  No change in bowel habits.  He is recovering from fracture of right dorsal talus.  He tolerated cycle 1 of Taxotere well without significant side effects.  He presents today for repeat labs and treatment.  He states he is ready to proceed.   REVIEW OF SYSTEMS:  Review of Systems  Constitutional: Positive for fatigue.  HENT:  Negative.   Eyes: Negative.   Respiratory: Negative.   Cardiovascular: Negative.   Gastrointestinal: Negative.   Endocrine: Negative.   Genitourinary: Negative.    Musculoskeletal: Positive for arthralgias, back pain and gait problem.  Skin: Negative.   Neurological: Positive for extremity weakness and gait problem.  Hematological: Negative.   Psychiatric/Behavioral: Negative.      PAST MEDICAL/SURGICAL HISTORY:  Past Medical History:  Diagnosis Date  . Cancer [facility])    prostate  . Family history of breast cancer   . Family history of prostate cancer   . GERD (gastroesophageal reflux disease)   . HOH (hard of hearing)   . Hyperlipidemia   . Hypertension   . Port-A-Cath in place 08/29/2019  . Prostate cancer metastatic to bone ([place]) 04/04/2016  . Sleep apnea 10/24/2016   Past Surgical History:  Procedure Laterality Date  . CATARACT EXTRACTION  W/PHACO Right 04/22/2019   Procedure: CATARACT EXTRACTION PHACO AND INTRAOCULAR LENS PLACEMENT RIGHT EYE;  Surgeon: Baruch Goldmann, MD;  Location: [facility];  Service: Ophthalmology;  Laterality: Right;  right  . CATARACT EXTRACTION W/PHACO Left 05/13/2019   Procedure: CATARACT EXTRACTION PHACO AND INTRAOCULAR LENS PLACEMENT LEFT EYE;   Surgeon: Baruch Goldmann, MD;  Location: [facility];  Service: Ophthalmology;  Laterality: Left;  left  . CHOLECYSTECTOMY    . CYSTOSCOPY W/ URETERAL STENT PLACEMENT Left 08/17/2019   Procedure: CYSTOSCOPY WITH RETROGRADE PYELOGRAM/URETERAL STENT PLACEMENT;  Surgeon: Cleon Gustin, MD;  Location: [facility];  Service: Urology;  Laterality: Left;  . HAND SURGERY    . INGUINAL HERNIA REPAIR Left   . PORTACATH PLACEMENT Left 09/07/2019   Procedure: INSERTION PORT-A-CATH;  Surgeon: Virl Cagey, MD;  Location: [facility];  Service: General;  Laterality: Left;  . PROSTATECTOMY    . PROSTATECTOMY    . radical prostatectomy    . TONSILLECTOMY       SOCIAL HISTORY:  Social History   Socioeconomic History  . Marital status: Widowed    Spouse name: Not on file  . Number of children: 3  . Years of education: 9  . Highest education level: Not on file  Occupational History  . Occupation: Animator- retired    Comment: [employer]  Social Needs  . Financial resource strain: Not on file  . Food insecurity    Worry: Not on file    Inability: Not on file  . Transportation needs    Medical: Not on file    Non-medical: Not on file  Tobacco Use  . Smoking status: Former Smoker    Packs/day: 1.00    Years: 32.00    Pack years: 32.00    Types: Cigarettes    Quit date: 02/04/1989    Years since quitting: 30.6  . Smokeless tobacco: Never Used  Substance and Sexual Activity  . Alcohol use: No  . Drug use: No  . Sexual activity: Not Currently  Lifestyle  . Physical activity    Days per week: Not on file    Minutes per session: Not on file  . Stress: Not on file  Relationships  . Social Herbalist on phone: Not on file    Gets together: Not on file    Attends religious service: Not on file    Active member of club or organization: Not on file    Attends meetings of clubs or organizations: Not on file    Relationship status: Not on file  . Intimate partner violence     Fear of current or ex partner: Not on file    Emotionally abused: Not on file    Physically abused: Not on file    Forced sexual activity: Not on file  Other Topics Concern  . Not on file  Social History Narrative   Widowed   [place] with son   Chronic low back pain    FAMILY HISTORY:  Family History  Problem Relation Age of Onset  . Heart failure Mother        CHF  . Cancer Mother        unknown  . Prostate cancer Father   . Narcolepsy Sister   . Cancer Daughter        unknown  . Breast cancer Maternal Aunt   . Cancer Maternal Grandmother        unknown  . Cancer Maternal Aunt  unknown type of cancer    CURRENT MEDICATIONS:  Outpatient Encounter Medications as of 09/30/2019  Medication Sig Note  . benazepril (LOTENSIN) 20 MG tablet TAKE ONE TABLET BY MOUTH DAILY.   . calcium-vitamin D (OSCAL WITH D) 500-200 MG-UNIT TABS tablet Take 1 tablet by mouth daily.   . cetirizine (ZYRTEC) 10 MG tablet TAKE ONE TABLET BY MOUTH DAILY.   . DOCETAXEL IV Inject into the vein every 21 ( twenty-one) days.   . Ferrous Sulfate (IRON) 325 (65 Fe) MG TABS Take 1 tablet by mouth daily.   . furosemide (LASIX) 40 MG tablet TAKE ONE TABLET BY MOUTH DAILY. TAKE WITH POTASSIUM.   Marland Kitchen hydrochlorothiazide (HYDRODIURIL) 25 MG tablet TAKE ONE TABLET BY MOUTH DAILY.   Marland Kitchen ILEVRO 0.3 % ophthalmic suspension INSTILL ONE DROP IN THE OPERATIVE EYE DAILY. START TWO DAYS BEFORE SURGERY AND CONTINUE AS DIRECTED.   Marland Kitchen ketorolac (ACULAR) 0.5 % ophthalmic solution INSTILL ONE DROP IN THE OPERATIVE EYE TWICE DAILY.   Marland Kitchen losartan (COZAAR) 100 MG tablet Take 100 mg by mouth daily.   . meloxicam (MOBIC) 7.5 MG tablet Take 1 tablet (7.5 mg total) daily by mouth.   . methylphenidate (RITALIN) 20 MG tablet Take 20 mg by mouth daily.   . mirabegron ER (MYRBETRIQ) 25 MG TB24 tablet Take by mouth.   . moxifloxacin (VIGAMOX) 0.5 % ophthalmic solution INSTILL ONE DROP THREE TIMES DAILY IN THE OPERATIVE EYE. START TWO DAYS  BEFORE SURGERY AND CONTINUE AS DIRECTED.   Marland Kitchen potassium chloride (KLOR-CON) 20 MEQ packet Take by mouth.   . prednisoLONE acetate (PRED FORTE) 1 % ophthalmic suspension INSTILL ONE DROP IN THE OPERATIVE EYE THREE TIMES DAILY. START ONE DAY AFTER SURGERY AND CONTINUE AS DIRECTED.   Marland Kitchen clotrimazole-betamethasone (LOTRISONE) cream Apply 1 application topically 2 (two) times daily. (Patient not taking: Reported on 09/30/2019)   . fluticasone (FLONASE) 50 MCG/ACT nasal spray USE ONE SPRAY IN EACH NOSTRIL ONCE DAILY. SHAKE GENTLY BEFORE USING.   Marland Kitchen ketoconazole (NIZORAL) 2 % cream APPLY A SMALL AMOUNT TO FACE RASH TWICE DAILY FOR UP TO FOUR WEEKS IF NEEDED.   Marland Kitchen lidocaine-prilocaine (EMLA) cream Apply small amount to port a cath site and cover with plastic wrap 1 hour prior to chemotherapy appointments (Patient not taking: Reported on 09/14/2019)   . prochlorperazine (COMPAZINE) 10 MG tablet Take 1 tablet (10 mg total) by mouth every 6 (six) hours as needed for nausea or vomiting.   . selenium sulfide (SELSUN) 2.5 % shampoo Apply 1 application topically daily as needed for irritation. (Patient not taking: Reported on 09/14/2019)   . traMADol (ULTRAM) 50 MG tablet Take 1 tablet (50 mg total) by mouth every 6 (six) hours as needed. (Patient not taking: Reported on 09/14/2019)   . [DISCONTINUED] fexofenadine (ALLEGRA) 180 MG tablet Take 180 mg by mouth daily. 11/02/2018: Not sure which allergy medication he is currently taking   . [DISCONTINUED] prochlorperazine (COMPAZINE) 10 MG tablet Take 1 tablet (10 mg total) by mouth every 6 (six) hours as needed for nausea or vomiting. (Patient not taking: Reported on 09/14/2019)    Facility-Administered Encounter Medications as of 09/30/2019  Medication  . [COMPLETED] diphenhydrAMINE (BENADRYL) injection 25 mg  . DOCEtaxel (TAXOTERE) 120 mg in sodium chloride 0.9 % 250 mL chemo infusion  . [COMPLETED] famotidine (PEPCID) IVPB 20 mg premix  . [COMPLETED] ondansetron (ZOFRAN)  8 mg, dexamethasone (DECADRON) 10 mg in sodium chloride 0.9 % 50 mL IVPB    ALLERGIES:  No  Known Allergies   PHYSICAL EXAM:  ECOG Performance status: 2  Vitals:   09/30/19 0842  BP: 138/66  Pulse: 74  Resp: 18  Temp: (!) 97.5 F (36.4 C)  SpO2: 99%   Filed Weights   09/30/19 0842  Weight: 266 lb 1.6 oz (120.7 kg)    Physical Exam Constitutional:      Appearance: Normal appearance.  HENT:     Head: Normocephalic.     Right Ear: External ear normal.     Left Ear: External ear normal.     Nose: Nose normal.     Mouth/Throat:     Pharynx: Oropharynx is clear.  Eyes:     Conjunctiva/sclera: Conjunctivae normal.  Neck:     Musculoskeletal: Normal range of motion.  Cardiovascular:     Rate and Rhythm: Normal rate and regular rhythm.     Pulses: Normal pulses.     Heart sounds: Normal heart sounds.  Pulmonary:     Effort: Pulmonary effort is normal.     Breath sounds: Normal breath sounds.  Abdominal:     General: Bowel sounds are normal.  Musculoskeletal:        General: Signs of injury present.     Comments: Decreased range of motion. Avulsion fracture right dorsal tarsal  Skin:    General: Skin is warm.  Neurological:     General: No focal deficit present.     Mental Status: He is alert and oriented to person, place, and time.  Psychiatric:        Mood and Affect: Mood normal.        Behavior: Behavior normal.      LABORATORY DATA:  I have reviewed the labs as listed.  CBC    Component Value Date/Time   WBC 6.6 09/30/2019 0814   RBC 3.79 (L) 09/30/2019 0814   HGB 10.8 (L) 09/30/2019 0814   HCT 33.9 (L) 09/30/2019 0814   PLT 264 09/30/2019 0814   MCV 89.4 09/30/2019 0814   MCH 28.5 09/30/2019 0814   MCHC 31.9 09/30/2019 0814   RDW 15.1 09/30/2019 0814   LYMPHSABS 1.9 09/30/2019 0814   MONOABS 0.4 09/30/2019 0814   EOSABS 0.1 09/30/2019 0814   BASOSABS 0.0 09/30/2019 0814   CMP Latest Ref Rng & Units 09/30/2019 09/14/2019 09/08/2019  Glucose  70 - 99 mg/dL 86 107(H) 100(H)  BUN 8 - 23 mg/dL _0 Creatinine 0.61 - 1.24 mg/dL 1.65(H) 2.11(H) 1.71(H)  Sodium 135 - 145 mmol/L 137 139 138  Potassium 3.5 - 5.1 mmol/L 4.0 4.1 3.6  Chloride 98 - 111 mmol/L 105 103 104  CO2 22 - 32 mmol/L _1 Calcium 8.9 - 10.3 mg/dL 8.9 9.2 9.1  Total Protein 6.5 - 8.1 g/dL 7.1 7.1 6.7  Total Bilirubin 0.3 - 1.2 mg/dL 0.6 0.6 0.6  Alkaline Phos 38 - 126 U/L 89 80 65  AST 15 - 41 U/L 33 21 22  ALT 0 - 44 U/L _2 ASSESSMENT & PLAN:   Prostate cancer metastatic to bone ([place]) 1.  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 -Enzalutamide 160 mg daily started towards the end of November 2019 and discontinued in September 2020 upon progression. - CT CAP on 06/30/2019 showed 3.4  x 5.2 cm left iliac lymphadenopathy, progressed from prior scan.  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  No evidence of skeletal metastasis.  Chronic compression fracture of T8. -Germline mutation testing was negative.  Guardant 360 did not show any FDA approved therapies. -Bone scan on 07/27/2019 shows uptake at T8.  No other uptake.  PSA today 16.8. -Cycle 1 of chemotherapy with docetaxel 50 mg/m2 with Neulasta support on 09/08/2019. -He did not experience any nausea or vomiting.  He had mild diarrhea. -He fell on 09/12/2019 and presented to the ER.  X-ray of the right ankle showed small evulsion fracture of the anterior dorsal talus.  He has follow-up with orthopedics. -Labs are acceptable today to proceed with cycle 2 of docetaxel.  Creatinine improved 1.65. -He will return to clinic in 3 weeks.  2.  Bone strengthening: -He will continue denosumab started on 03/05/2017.  He will continue  calcium and vitamin D.  3.  CKD: -Creatinine increased to 2.11, partly because of hydronephrosis. -Serum creatinine improved to 1.65.  We will continue to monitor closely and dose adjust as indicated.       [place] [phone]

## 2019-09-30 NOTE — Assessment & Plan Note (Signed)
1.  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 -Enzalutamide 160 mg daily started towards the end of November 2019 and discontinued in September 2020 upon progression. 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  No evidence of skeletal metastasis.  Chronic compression fracture of T8. -Germline mutation testing was negative.  Guardant 360 did not show any FDA approved therapies. -Bone scan on 07/27/2019 shows uptake at T8.  No other uptake.  PSA today 16.8. -Cycle 1 of chemotherapy with docetaxel 50 mg/m2 with Neulasta support on 09/08/2019. -He did not experience any nausea or vomiting.  He had mild diarrhea. -He fell on 09/12/2019 and presented to the ER.  X-ray of the right ankle showed small evulsion fracture of the anterior dorsal talus.  He has follow-up with orthopedics. -Labs are acceptable today to proceed with cycle 2 of docetaxel.  Creatinine improved 1.65. -He will return to clinic in 3 weeks.  2.  Bone strengthening: -He will continue denosumab started on 03/05/2017.  He will continue calcium and vitamin D.  3.  CKD: -Creatinine increased to 2.11, partly because of hydronephrosis. -Serum creatinine improved to 1.65.  We will continue to monitor closely and dose adjust as indicated.

## 2019-09-30 NOTE — Progress Notes (Signed)
Patient sent for fluoro port evaluation due to lack of blood return, port accessed by [place]. Upon examining port good blood return noted. Ordering provider and staff advised and second attempt at blood draw was successful drawing 24ml of blood without resistance. Procedure cancelled and patient taken back to [facility].

## 2019-10-03 ENCOUNTER — Inpatient Hospital Stay (HOSPITAL_COMMUNITY): Payer: Medicare Other

## 2019-10-04 ENCOUNTER — Encounter (HOSPITAL_COMMUNITY): Payer: Self-pay

## 2019-10-04 ENCOUNTER — Inpatient Hospital Stay (HOSPITAL_COMMUNITY): Payer: Medicare Other

## 2019-10-04 VITALS — BP 145/78 | HR 99 | Temp 97.6°F | Resp 20

## 2019-10-04 DIAGNOSIS — Z95828 Presence of other vascular implants and grafts: Secondary | ICD-10-CM

## 2019-10-04 DIAGNOSIS — C61 Malignant neoplasm of prostate: Secondary | ICD-10-CM

## 2019-10-04 DIAGNOSIS — C7951 Secondary malignant neoplasm of bone: Secondary | ICD-10-CM

## 2019-10-04 DIAGNOSIS — Z5189 Encounter for other specified aftercare: Secondary | ICD-10-CM | POA: Diagnosis not present

## 2019-10-04 DIAGNOSIS — Z452 Encounter for adjustment and management of vascular access device: Secondary | ICD-10-CM | POA: Diagnosis not present

## 2019-10-04 DIAGNOSIS — Z5111 Encounter for antineoplastic chemotherapy: Secondary | ICD-10-CM | POA: Diagnosis not present

## 2019-10-04 MED ORDER — PEGFILGRASTIM INJECTION 6 MG/0.6ML ~~LOC~~
6.0000 mg | PREFILLED_SYRINGE | Freq: Once | SUBCUTANEOUS | Status: AC
  Administered 2019-10-04: 6 mg via SUBCUTANEOUS
  Filled 2019-10-04: qty 0.6

## 2019-10-04 NOTE — Progress Notes (Signed)
Patient tolerated Neulasta injection with no complaints voiced.  Site clean and dry with no bruising or swelling noted at site.  Band aid applied.  Vss with discharge and left ambulatory with no s/s of distress noted.

## 2019-10-07 ENCOUNTER — Emergency Department (HOSPITAL_COMMUNITY): Payer: Medicare Other

## 2019-10-07 ENCOUNTER — Encounter (HOSPITAL_COMMUNITY): Payer: Self-pay

## 2019-10-07 ENCOUNTER — Other Ambulatory Visit: Payer: Self-pay

## 2019-10-07 ENCOUNTER — Encounter: Payer: Self-pay | Admitting: General Practice

## 2019-10-07 ENCOUNTER — Emergency Department (HOSPITAL_COMMUNITY)
Admission: EM | Admit: 2019-10-07 | Discharge: 2019-10-07 | Disposition: A | Discharge status: Home / Self Care | Payer: Medicare Other | Attending: Emergency Medicine | Admitting: Emergency Medicine

## 2019-10-07 DIAGNOSIS — M5442 Lumbago with sciatica, left side: Secondary | ICD-10-CM | POA: Diagnosis not present

## 2019-10-07 DIAGNOSIS — M5441 Lumbago with sciatica, right side: Secondary | ICD-10-CM | POA: Diagnosis not present

## 2019-10-07 DIAGNOSIS — R0902 Hypoxemia: Secondary | ICD-10-CM | POA: Diagnosis not present

## 2019-10-07 DIAGNOSIS — Z79899 Other long term (current) drug therapy: Secondary | ICD-10-CM | POA: Insufficient documentation

## 2019-10-07 DIAGNOSIS — R5381 Other malaise: Secondary | ICD-10-CM | POA: Diagnosis not present

## 2019-10-07 DIAGNOSIS — M545 Low back pain: Secondary | ICD-10-CM | POA: Diagnosis not present

## 2019-10-07 DIAGNOSIS — Z87891 Personal history of nicotine dependence: Secondary | ICD-10-CM | POA: Insufficient documentation

## 2019-10-07 DIAGNOSIS — M5489 Other dorsalgia: Secondary | ICD-10-CM | POA: Diagnosis not present

## 2019-10-07 DIAGNOSIS — I10 Essential (primary) hypertension: Secondary | ICD-10-CM | POA: Insufficient documentation

## 2019-10-07 DIAGNOSIS — C7951 Secondary malignant neoplasm of bone: Secondary | ICD-10-CM | POA: Insufficient documentation

## 2019-10-07 DIAGNOSIS — M546 Pain in thoracic spine: Secondary | ICD-10-CM | POA: Diagnosis not present

## 2019-10-07 DIAGNOSIS — Z743 Need for continuous supervision: Secondary | ICD-10-CM | POA: Diagnosis not present

## 2019-10-07 LAB — BASIC METABOLIC PANEL
Anion gap: 10 (ref 5–15)
BUN: 22 mg/dL (ref 8–23)
CO2: 25 mmol/L (ref 22–32)
Calcium: 8.8 mg/dL — ABNORMAL LOW (ref 8.9–10.3)
Chloride: 100 mmol/L (ref 98–111)
Creatinine, Ser: 1.95 mg/dL — ABNORMAL HIGH (ref 0.61–1.24)
GFR calc Af Amer: 37 mL/min — ABNORMAL LOW (ref >60)
GFR calc non Af Amer: 32 mL/min — ABNORMAL LOW (ref >60)
Glucose, Bld: 107 mg/dL — ABNORMAL HIGH (ref 70–99)
Potassium: 4.3 mmol/L (ref 3.5–5.1)
Sodium: 135 mmol/L (ref 135–145)

## 2019-10-07 LAB — CBC WITH DIFFERENTIAL/PLATELET
Abs Immature Granulocytes: 0.47 10*3/uL — ABNORMAL HIGH (ref 0.00–0.07)
Basophils Absolute: 0.1 10*3/uL (ref 0.0–0.1)
Basophils Relative: 2 %
Eosinophils Absolute: 0.1 10*3/uL (ref 0.0–0.5)
Eosinophils Relative: 2 %
HCT: 28.1 % — ABNORMAL LOW (ref 39.0–52.0)
Hemoglobin: 9 g/dL — ABNORMAL LOW (ref 13.0–17.0)
Immature Granulocytes: 8 %
Lymphocytes Relative: 24 %
Lymphs Abs: 1.5 10*3/uL (ref 0.7–4.0)
MCH: 28.7 pg (ref 26.0–34.0)
MCHC: 32 g/dL (ref 30.0–36.0)
MCV: 89.5 fL (ref 80.0–100.0)
Monocytes Absolute: 0.5 10*3/uL (ref 0.1–1.0)
Monocytes Relative: 9 %
Neutro Abs: 3.5 10*3/uL (ref 1.7–7.7)
Neutrophils Relative %: 55 %
Platelets: 173 10*3/uL (ref 150–400)
RBC: 3.14 MIL/uL — ABNORMAL LOW (ref 4.22–5.81)
RDW: 15 % (ref 11.5–15.5)
WBC: 6.2 10*3/uL (ref 4.0–10.5)
nRBC: 0 % (ref 0.0–0.2)

## 2019-10-07 MED ORDER — ONDANSETRON HCL 4 MG/2ML IJ SOLN
4.0000 mg | Freq: Once | INTRAMUSCULAR | Status: AC
  Administered 2019-10-07: 4 mg via INTRAVENOUS
  Filled 2019-10-07: qty 2

## 2019-10-07 MED ORDER — HEPARIN SOD (PORK) LOCK FLUSH 100 UNIT/ML IV SOLN
500.0000 [IU] | Freq: Once | INTRAVENOUS | Status: AC
  Administered 2019-10-07: 500 [IU]
  Filled 2019-10-07: qty 5

## 2019-10-07 MED ORDER — MORPHINE SULFATE (PF) 4 MG/ML IV SOLN
4.0000 mg | Freq: Once | INTRAVENOUS | Status: AC
  Administered 2019-10-07: 4 mg via INTRAVENOUS
  Filled 2019-10-07: qty 1

## 2019-10-07 NOTE — ED Triage Notes (Signed)
EMS called out for lower back pain. Pt says his lower back started hurting yesterday, EMS got report that pain started 4 days ago. Pt son was present when EMS arrived and provided info regarding pt complaints and symptoms. Pt also reports bilateral leg pain. Denies urinary symptoms.

## 2019-10-07 NOTE — ED Provider Notes (Signed)
[facility] EMERGENCY DEPARTMENT Provider Note   CSN: [id number] Arrival date & time: 10/07/19  0345     History Chief Complaint  Patient presents with  . Back Pain    [name] is a 78 y.o. male.  Patient sent to the ER for evaluation of back pain.  Patient comes to the ER from home by EMS.  EMS report that the patient's son was present when they arrived.  Son reports that the patient has been having pain for several days and has fallen a couple of times.  Patient has been having difficulty getting to the bathroom, son reports that he has difficulty bathing him and is no longer able to care for him.  At arrival, patient reports that he has pain in his low back that radiates down both legs.  No numbness or tingling.        Past Medical History:  Diagnosis Date  . Cancer [facility])    prostate  . Family history of breast cancer   . Family history of prostate cancer   . GERD (gastroesophageal reflux disease)   . HOH (hard of hearing)   . Hyperlipidemia   . Hypertension   . Port-A-Cath in place 08/29/2019  . 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 Sleep apnea 10/24/2016    Patient Active Problem List   Diagnosis Date Noted  . Port-A-Cath in place 08/29/2019  . Goals of care, counseling/discussion 08/25/2019  . Genetic testing 07/14/2019  . Family history of prostate cancer   . Family history of breast cancer   . Tubular adenoma of colon 12/03/2016  . Morbid obesity ([place]) 10/24/2016  . Essential hypertension 10/24/2016  . HLD (hyperlipidemia) 10/24/2016  . 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 Chronic back pain 10/24/2016  . PUD (peptic ulcer disease) 10/24/2016  . GERD (gastroesophageal reflux disease) 10/24/2016  . Hemorrhoids 10/24/2016  . Sleep apnea 10/24/2016  . Uncontrolled daytime somnolence 10/24/2016  . HOH (hard of hearing) 10/24/2016  . Testicular discomfort 05/07/2016  . Prostate cancer metastatic to bone [facility])  04/04/2016    Past Surgical History:  Procedure Laterality Date  . CATARACT EXTRACTION W/PHACO Right 04/22/2019   Procedure: CATARACT EXTRACTION PHACO AND INTRAOCULAR LENS PLACEMENT RIGHT EYE;  Surgeon: Baruch Goldmann, MD;  Location: [facility] ORS;  Service: Ophthalmology;  Laterality: Right;  right  . CATARACT EXTRACTION W/PHACO Left 05/13/2019   Procedure: CATARACT EXTRACTION PHACO AND INTRAOCULAR LENS PLACEMENT LEFT EYE;  Surgeon: Baruch Goldmann, MD;  Location: [facility];  Service: Ophthalmology;  Laterality: Left;  left  . CHOLECYSTECTOMY    . CYSTOSCOPY W/ URETERAL STENT PLACEMENT Left 08/17/2019   Procedure: CYSTOSCOPY WITH RETROGRADE PYELOGRAM/URETERAL STENT PLACEMENT;  Surgeon: Cleon Gustin, MD;  Location: [facility] OR[facility];  Service: Urology;  Laterality: Left;  . HAND SURGERY    . INGUINAL HERNIA REPAIR Left   . PORTACATH PLACEMENT Left 09/07/2019   Procedure: INSERTION PORT-A-CATH;  Surgeon: Virl Cagey, MD;  Location: [facility];  Service: General;  Laterality: Left;  . PROSTATECTOMY    . PROSTATECTOMY    . radical prostatectomy    . TONSILLECTOMY         Family History  Problem Relation Age of Onset  . Heart failure Mother        CHF  . Cancer Mother        unknown  . Prostate cancer Father   . Narcolepsy Sister   . Cancer Daughter  unknown  . Breast cancer Maternal Aunt   . Cancer Maternal Grandmother        unknown  . Cancer Maternal Aunt        unknown type of cancer    Social History   Tobacco Use  . Smoking status: Former Smoker    Packs/day: 1.00    Years: 32.00    Pack years: 32.00    Types: Cigarettes    Quit date: 02/04/1989    Years since quitting: 30.6  . Smokeless tobacco: Never Used  Substance Use Topics  . Alcohol use: No  . Drug use: No    Home Medications Prior to Admission medications   Medication Sig Start Date End Date Taking? Authorizing Provider  benazepril (LOTENSIN) 20 MG tablet TAKE ONE TABLET BY MOUTH DAILY. 08/14/17   [name], MD  calcium-vitamin D (OSCAL WITH D) 500-200 MG-UNIT TABS tablet Take 1 tablet by mouth daily. 07/19/18   [provider]  cetirizine (ZYRTEC) 10 MG tablet TAKE ONE TABLET BY MOUTH DAILY. 11/17/17   [name], MD  clotrimazole-betamethasone (LOTRISONE) cream Apply 1 application topically 2 (two) times daily. 07/09/17   [name], MD  DOCETAXEL IV Inject into the vein every 21 ( twenty-one) days. 08/26/19   [provider]  Ferrous Sulfate (IRON) 325 (65 Fe) MG TABS Take 1 tablet by mouth daily. 01/21/19   [provider]  fluticasone (FLONASE) 50 MCG/ACT nasal spray USE ONE SPRAY IN EACH NOSTRIL ONCE DAILY. SHAKE GENTLY BEFORE USING. 12/15/18   [provider]  furosemide (LASIX) 40 MG tablet TAKE ONE TABLET BY MOUTH DAILY. TAKE WITH POTASSIUM. 05/21/18   [provider]  hydrochlorothiazide (HYDRODIURIL) 25 MG tablet TAKE ONE TABLET BY MOUTH DAILY. 08/14/17   [name], MD  ILEVRO 0.3 % ophthalmic suspension INSTILL ONE DROP IN THE OPERATIVE EYE DAILY. START TWO DAYS BEFORE SURGERY AND CONTINUE AS DIRECTED. 01/07/19   [provider]  ketoconazole (NIZORAL) 2 % cream APPLY A SMALL AMOUNT TO FACE RASH TWICE DAILY FOR UP TO FOUR WEEKS IF NEEDED. 09/16/18   [provider]  ketorolac (ACULAR) 0.5 % ophthalmic solution INSTILL ONE DROP IN THE OPERATIVE EYE TWICE DAILY. 04/22/19   [provider]  lidocaine-prilocaine (EMLA) cream Apply small amount to port a cath site and cover with plastic wrap 1 hour prior to chemotherapy appointments 08/16/19   Derek Jack, MD  losartan (COZAAR) 100 MG tablet Take 100 mg by mouth daily. 04/02/18   [provider]  meloxicam (MOBIC) 7.5 MG tablet Take 1 tablet (7.5 mg total) daily by mouth. 09/11/17   [name], MD  methylphenidate (RITALIN) 20 MG tablet Take 20 mg by mouth daily. 07/03/18   [provider]  mirabegron ER (MYRBETRIQ)  25 MG TB24 tablet Take by mouth.    [provider]  moxifloxacin (VIGAMOX) 0.5 % ophthalmic solution INSTILL ONE DROP THREE TIMES DAILY IN THE OPERATIVE EYE. START TWO DAYS BEFORE SURGERY AND CONTINUE AS DIRECTED. 01/07/19   [provider]  potassium chloride (KLOR-CON) 20 MEQ packet Take by mouth.    [provider]  prednisoLONE acetate (PRED FORTE) 1 % ophthalmic suspension INSTILL ONE DROP IN THE OPERATIVE EYE THREE TIMES DAILY. START ONE DAY AFTER SURGERY AND CONTINUE AS DIRECTED. 01/07/19   [provider]  prochlorperazine (COMPAZINE) 10 MG tablet Take 1 tablet (10 mg total) by mouth every 6 (six) hours as needed for nausea or vomiting.  09/30/19   Roger Shelter, FNP  selenium sulfide (SELSUN) 2.5 % shampoo Apply 1 application topically daily as needed for irritation. 03/11/17   [name], MD  traMADol (ULTRAM) 50 MG tablet Take 1 tablet (50 mg total) by mouth every 6 (six) hours as needed. 08/17/19 08/16/20  Cleon Gustin, MD    Allergies    Patient has no known allergies.  Review of Systems   Review of Systems  Musculoskeletal: Positive for back pain.  All other systems reviewed and are negative.   Physical Exam Updated Vital Signs BP 118/89   Pulse 88   Temp 99.2 F (37.3 C) (Oral)   Resp 20   Wt 120.7 kg   SpO2 98%   BMI 38.73 kg/m   Physical Exam Vitals and nursing note reviewed.  Constitutional:      General: He is not in acute distress.    Appearance: Normal appearance. He is well-developed.  HENT:     Head: Normocephalic and atraumatic.     Nose: Nose normal.  Eyes:     Conjunctiva/sclera: Conjunctivae normal.     Pupils: Pupils are equal, round, and reactive to light.  Cardiovascular:     Rate and Rhythm: Regular rhythm.     Heart sounds: S1 normal and S2 normal. No murmur. No friction rub. No gallop.   Pulmonary:     Effort: Pulmonary effort is normal. No respiratory distress.     Breath sounds: Normal  breath sounds.  Chest:     Chest wall: No tenderness.  Abdominal:     General: Bowel sounds are normal.     Palpations: Abdomen is soft.     Tenderness: There is no abdominal tenderness. There is no guarding or rebound. Negative signs include Murphy's sign and McBurney's sign.     Hernia: No hernia is present.  Musculoskeletal:        General: Normal range of motion.     Cervical back: Normal range of motion and neck supple.     Lumbar back: Tenderness present. Negative right straight leg raise test and negative left straight leg raise test.  Skin:    General: Skin is warm and dry.     Findings: No rash.  Neurological:     Mental Status: He is alert and oriented to person, place, and time.     GCS: GCS eye subscore is 4. GCS verbal subscore is 5. GCS motor subscore is 6.     Cranial Nerves: No cranial nerve deficit.     Sensory: No sensory deficit.     Coordination: Coordination normal.     Deep Tendon Reflexes:     Reflex Scores:      Patellar reflexes are 1+ on the right side and 1+ on the left side.    Comments: Patient has normal sensation in both lower extremities.  No saddle anesthesia.  He is able to raise both legs off the bed without difficulty.  Psychiatric:        Speech: Speech normal.        Behavior: Behavior normal.        Thought Content: Thought content normal.     ED Results / Procedures / Treatments   Labs (all labs ordered are listed, but only abnormal results are displayed) Labs Reviewed  CBC WITH DIFFERENTIAL/PLATELET - Abnormal; Notable for the following components:      Result Value   RBC 3.14 (*)    Hemoglobin 9.0 (*)  HCT 28.1 (*)    Abs Immature Granulocytes 0.47 (*)    All other components within normal limits  BASIC METABOLIC PANEL - Abnormal; Notable for the following components:   Glucose, Bld 107 (*)    Creatinine, Ser 1.95 (*)    Calcium 8.8 (*)    GFR calc non Af Amer 32 (*)    GFR calc Af Amer 37 (*)    All other components within  normal limits  URINALYSIS, ROUTINE W REFLEX MICROSCOPIC    EKG None  Radiology CT Lumbar Spine Wo Contrast  Result Date: 10/07/2019 CLINICAL DATA:  Lower back pain that started hurting yesterday. Bilateral leg pain EXAM: CT LUMBAR SPINE WITHOUT CONTRAST TECHNIQUE: Multidetector CT imaging of the lumbar spine was performed without intravenous contrast administration. Multiplanar CT image reconstructions were also generated. COMPARISON:  Lumbar MRI report October 04, 2008 FINDINGS: Segmentation: 5 lumbar type vertebrae Alignment: Slight grade 1 anterolisthesis at L3-4 and L4-5, facet mediated Vertebrae: No evidence of fracture, discitis, or metastatic disease. Paraspinal and other soft tissues: Partially covered mass along the left iliac chain, adenopathy from patient's prostate cancer. There is a left internal ureteral stent in place with no left hydronephrosis. The adenopathy contacts the anterior vertebral bodies without erosion. Disc levels: T12- L1: Unremarkable. L1-L2: Unremarkable. L2-L3: Minor facet spurring L3-L4: Facet osteoarthritis. Mild disc narrowing and bulging with a shallow right foraminal protrusion. Spinal stenosis is mild-to-moderate based on axial slices [id number]: Facet osteoarthritis with spurring and slight anterolisthesis. The disc is narrowed and bulging and there is moderate spinal stenosis L5-S1:Facet spurring. Disc narrowing and bulging. Mild bilateral foraminal and spinal stenosis. IMPRESSION: 1. No emergent finding. 2. 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 3. History of prostate cancer with known left iliac adenopathy. The visualized portions of a left ureteral stent are unremarkable. Electronically Signed   By: Monte Fantasia M.D.   On: 10/07/2019 06:00    Procedures Procedures (including critical care time)  Medications Ordered in ED Medications  ondansetron (ZOFRAN) injection 4 mg (4 mg Intravenous Given  10/07/19 0501)  morphine 4 MG/ML injection 4 mg (4 mg Intravenous Given 10/07/19 0501)    ED Course  I have reviewed the triage vital signs and the nursing notes.  Pertinent labs & imaging results that were available during my care of the patient were reviewed by me and considered in my medical decision making (see chart for details).    MDM Rules/Calculators/A&P     CHA2DS2/VAS Stroke Risk Points      N/A >= 2 Points: High Risk  1 - 1.99 Points: Medium Risk  0 Points: Low Risk    A final score could not be computed because of missing components.: Last  Change: N/A     This score determines the patient's risk of having a stroke if the  patient has atrial fibrillation.      This score is not applicable to this patient. Components are not  calculated.                   Patient sent to the ER by son.  EMS was initially called out for low back pain which the patient does endorse.  Further discussion with the son by EMS as well as nursing staff here reveals that the patient was sent here because the son can no longer care for him.  He would like him to be placed in a nursing home.  Work-up for his chronic low back pain performed.  No signs of cauda equina syndrome or any neurologic symptoms on exam.  Will place social work consult.  Final Clinical Impression(s) / ED Diagnoses Final diagnoses:  Midline low back pain with bilateral sciatica, unspecified chronicity    Rx / DC Orders ED Discharge Orders    None       Orpah Greek, MD 10/07/19 [phone]

## 2019-10-07 NOTE — ED Notes (Signed)
Pt hard of hearing and requires extra explaining of treatment plan. Peri Care and brief changed on Pt.

## 2019-10-07 NOTE — Clinical Social Work Note (Signed)
Message left for patient's son, Raji, requesting return contact. Spoke with patient's sister who indicted that patient's son lives with patient in patient's home. She stated that there was no reason that patient was unable to return to his home. Patient's sister advised that she would pick patient up when he was ready for ED discharge and take him home.    Nedim Oki, Clydene Pugh, LCSW

## 2019-10-07 NOTE — ED Notes (Addendum)
Pt son to nurses station- pt son reports to nurse that pt has fallen out of bed a couple of times. Reports that his Dad is old and needs help with toileting, bathing, ect. Pt says he cannot do it, and no other family members can do this. States pt needs to be in a nursing home and that he works all of the time. When asked how pt was going to get home if discharged, pt son responds "Can't ya'll do it?" This nurse responded, "who me?" pt son says, "no, the hospital". This nurse responded, "the hospital does not provide transportation for patients." Pt son says, "Well ya'll can call my sister, but she probably can't get here until 10 am or so, and if you can't get in touch with her, then I can come get him when I get off work at 3:30 pm. Pt son says he is about to go to work now. Pt son also states, "Well can ya'll clean him up, because he needs a bath."   Informed pt son that I will request Dr Betsey Holiday order a social work consult for pt.  Dr Betsey Holiday made aware of this conversation.

## 2019-10-07 NOTE — Progress Notes (Signed)
[place] CSW Progress Notes  Secure chat from [place] covering [facility] ED.  Per CSW, patient's Son would like for patient to have [place]. It sounds like he is interested in Therapist, sports, aide and PT. Son is also interested in DME (hospital bed, shower chair).  CSW relayed request to Loetta Rough, [facility] Nurse Navigator as [facility] and DME needs are arranged by nursing staff in our outpatient settings.    Edwyna Shell, LCSW Clinical Social Worker Phone:  [phone] Cell:  [phone]

## 2019-10-07 NOTE — ED Notes (Signed)
Pt has walked out of ED room multiple times this morning asking if he can leave yet. Pt has removed hospital gown and condom cathPt made aware that we are waiting on SW to see him. Pt appears confused as to why he can't go home. Pt reassured that we just want to make sure he's safe to go home.

## 2019-10-07 NOTE — Discharge Instructions (Signed)
Please follow-up with your physician or return here for concerning changes.

## 2019-10-07 NOTE — ED Notes (Signed)
Pt's sister has been notified that pt is up for discharge. She will coming now to pick him up.

## 2019-10-10 ENCOUNTER — Other Ambulatory Visit (HOSPITAL_COMMUNITY): Payer: Medicare Other

## 2019-10-10 ENCOUNTER — Ambulatory Visit (HOSPITAL_COMMUNITY): Payer: Medicare Other

## 2019-10-10 ENCOUNTER — Ambulatory Visit (HOSPITAL_COMMUNITY): Payer: Medicare Other | Admitting: Hematology

## 2019-10-11 ENCOUNTER — Ambulatory Visit (HOSPITAL_COMMUNITY): Payer: Medicare Other

## 2019-10-11 ENCOUNTER — Ambulatory Visit (HOSPITAL_COMMUNITY): Payer: Medicare Other | Admitting: Hematology

## 2019-10-11 ENCOUNTER — Other Ambulatory Visit (HOSPITAL_COMMUNITY): Payer: Medicare Other

## 2019-10-17 DIAGNOSIS — I10 Essential (primary) hypertension: Secondary | ICD-10-CM | POA: Diagnosis not present

## 2019-10-17 DIAGNOSIS — N183 Chronic kidney disease, stage 3 unspecified: Secondary | ICD-10-CM | POA: Diagnosis not present

## 2019-10-17 DIAGNOSIS — D649 Anemia, unspecified: Secondary | ICD-10-CM | POA: Diagnosis not present

## 2019-10-17 DIAGNOSIS — R7301 Impaired fasting glucose: Secondary | ICD-10-CM | POA: Diagnosis not present

## 2019-10-17 DIAGNOSIS — D631 Anemia in chronic kidney disease: Secondary | ICD-10-CM | POA: Diagnosis not present

## 2019-10-17 DIAGNOSIS — E782 Mixed hyperlipidemia: Secondary | ICD-10-CM | POA: Diagnosis not present

## 2019-10-24 ENCOUNTER — Inpatient Hospital Stay (HOSPITAL_BASED_OUTPATIENT_CLINIC_OR_DEPARTMENT_OTHER): Payer: Medicare Other | Admitting: Hematology

## 2019-10-24 ENCOUNTER — Inpatient Hospital Stay (HOSPITAL_COMMUNITY): Payer: Medicare Other

## 2019-10-24 ENCOUNTER — Other Ambulatory Visit: Payer: Self-pay

## 2019-10-24 VITALS — BP 154/84 | HR 80 | Resp 16

## 2019-10-24 DIAGNOSIS — Z452 Encounter for adjustment and management of vascular access device: Secondary | ICD-10-CM | POA: Diagnosis not present

## 2019-10-24 DIAGNOSIS — C7951 Secondary malignant neoplasm of bone: Secondary | ICD-10-CM

## 2019-10-24 DIAGNOSIS — Z5189 Encounter for other specified aftercare: Secondary | ICD-10-CM | POA: Diagnosis not present

## 2019-10-24 DIAGNOSIS — C61 Malignant neoplasm of prostate: Secondary | ICD-10-CM

## 2019-10-24 DIAGNOSIS — Z95828 Presence of other vascular implants and grafts: Secondary | ICD-10-CM

## 2019-10-24 DIAGNOSIS — Z5111 Encounter for antineoplastic chemotherapy: Secondary | ICD-10-CM | POA: Diagnosis not present

## 2019-10-24 LAB — CBC WITH DIFFERENTIAL/PLATELET
Abs Immature Granulocytes: 0.04 10*3/uL (ref 0.00–0.07)
Basophils Absolute: 0.1 10*3/uL (ref 0.0–0.1)
Basophils Relative: 1 %
Eosinophils Absolute: 2.3 10*3/uL — ABNORMAL HIGH (ref 0.0–0.5)
Eosinophils Relative: 22 %
HCT: 30.2 % — ABNORMAL LOW (ref 39.0–52.0)
Hemoglobin: 9.6 g/dL — ABNORMAL LOW (ref 13.0–17.0)
Immature Granulocytes: 0 %
Lymphocytes Relative: 17 %
Lymphs Abs: 1.7 10*3/uL (ref 0.7–4.0)
MCH: 28.9 pg (ref 26.0–34.0)
MCHC: 31.8 g/dL (ref 30.0–36.0)
MCV: 91 fL (ref 80.0–100.0)
Monocytes Absolute: 0.5 10*3/uL (ref 0.1–1.0)
Monocytes Relative: 5 %
Neutro Abs: 5.6 10*3/uL (ref 1.7–7.7)
Neutrophils Relative %: 55 %
Platelets: 296 10*3/uL (ref 150–400)
RBC: 3.32 MIL/uL — ABNORMAL LOW (ref 4.22–5.81)
RDW: 17.2 % — ABNORMAL HIGH (ref 11.5–15.5)
WBC: 10.2 10*3/uL (ref 4.0–10.5)
nRBC: 0 % (ref 0.0–0.2)

## 2019-10-24 LAB — COMPREHENSIVE METABOLIC PANEL
ALT: 16 U/L (ref 0–44)
AST: 22 U/L (ref 15–41)
Albumin: 3.6 g/dL (ref 3.5–5.0)
Alkaline Phosphatase: 86 U/L (ref 38–126)
Anion gap: 11 (ref 5–15)
BUN: 18 mg/dL (ref 8–23)
CO2: 24 mmol/L (ref 22–32)
Calcium: 8.8 mg/dL — ABNORMAL LOW (ref 8.9–10.3)
Chloride: 102 mmol/L (ref 98–111)
Creatinine, Ser: 1.67 mg/dL — ABNORMAL HIGH (ref 0.61–1.24)
GFR calc Af Amer: 44 mL/min — ABNORMAL LOW (ref >60)
GFR calc non Af Amer: 38 mL/min — ABNORMAL LOW (ref >60)
Glucose, Bld: 94 mg/dL (ref 70–99)
Potassium: 3.7 mmol/L (ref 3.5–5.1)
Sodium: 137 mmol/L (ref 135–145)
Total Bilirubin: 0.7 mg/dL (ref 0.3–1.2)
Total Protein: 6.9 g/dL (ref 6.5–8.1)

## 2019-10-24 MED ORDER — ALTEPLASE 2 MG IJ SOLR
INTRAMUSCULAR | Status: AC
  Filled 2019-10-24: qty 2

## 2019-10-24 MED ORDER — HEPARIN SOD (PORK) LOCK FLUSH 100 UNIT/ML IV SOLN
500.0000 [IU] | Freq: Once | INTRAVENOUS | Status: AC | PRN
  Administered 2019-10-24: 500 [IU]

## 2019-10-24 MED ORDER — STERILE WATER FOR INJECTION IJ SOLN
INTRAMUSCULAR | Status: AC
  Filled 2019-10-24: qty 10

## 2019-10-24 MED ORDER — DIPHENHYDRAMINE HCL 50 MG/ML IJ SOLN
25.0000 mg | Freq: Once | INTRAMUSCULAR | Status: AC
  Administered 2019-10-24: 25 mg via INTRAVENOUS
  Filled 2019-10-24: qty 1

## 2019-10-24 MED ORDER — ALTEPLASE 2 MG IJ SOLR
2.0000 mg | Freq: Once | INTRAMUSCULAR | Status: AC
  Administered 2019-10-24: 2 mg

## 2019-10-24 MED ORDER — SODIUM CHLORIDE 0.9 % IV SOLN
Freq: Once | INTRAVENOUS | Status: AC
  Filled 2019-10-24: qty 4

## 2019-10-24 MED ORDER — FAMOTIDINE IN NACL 20-0.9 MG/50ML-% IV SOLN
20.0000 mg | Freq: Once | INTRAVENOUS | Status: AC
  Administered 2019-10-24: 20 mg via INTRAVENOUS
  Filled 2019-10-24: qty 50

## 2019-10-24 MED ORDER — SODIUM CHLORIDE 0.9 % IV SOLN: Freq: Once | INTRAVENOUS | Status: AC

## 2019-10-24 MED ORDER — SODIUM CHLORIDE 0.9 % IV SOLN
50.0000 mg/m2 | Freq: Once | INTRAVENOUS | Status: AC
  Administered 2019-10-24: 120 mg via INTRAVENOUS
  Filled 2019-10-24: qty 12

## 2019-10-24 MED ORDER — DENOSUMAB 120 MG/1.7ML ~~LOC~~ SOLN
120.0000 mg | Freq: Once | SUBCUTANEOUS | Status: AC
  Administered 2019-10-24: 120 mg via SUBCUTANEOUS
  Filled 2019-10-24: qty 1.7

## 2019-10-24 MED ORDER — SODIUM CHLORIDE 0.9% FLUSH
10.0000 mL | INTRAVENOUS | Status: DC | PRN
  Administered 2019-10-24: 10 mL

## 2019-10-24 NOTE — Assessment & Plan Note (Signed)
1.  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 -Enzalutamide 160 mg daily started towards the end of November 2019 and discontinued in September 2020 upon progression. 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  No evidence of skeletal metastasis.  Chronic compression fracture of T8. -Germline mutation testing was negative.  Guardant 360 did not show any FDA approved therapies. -Bone scan on 07/27/2019 shows uptake at T8.  No other uptake.  PSA today 16.8. -Cycle 1 of chemotherapy with docetaxel 50 mg/m2 with Neulasta support on 09/08/2019. -He did not experience any nausea or vomiting.  He had mild diarrhea. -He fell on 09/12/2019 and presented to the ER.  X-ray of the right ankle showed small evulsion fracture of the anterior dorsal talus.  He has follow-up with orthopedics. -Labs are acceptable today to proceed with cycle 2 of docetaxel.  Creatinine improved 1.65. -Seen in the emergency room on 1211 for lumbar back pain.  CT of lumbar spine was negative for any acute findings.  Back pain has since resolved. -Labs are acceptable to proceed with cycle 3 today.  He will need a PSA prior to his next visit. -He will return to clinic in 3 weeks.  2.  Bone strengthening: -He will continue denosumab started on 03/05/2017.  He will continue calcium and vitamin D.  3.  CKD: -Serum creatinine is stable.  We will continue to monitor and dose adjust accordingly.

## 2019-10-24 NOTE — Progress Notes (Signed)
[place], [place] [address]   CLINIC:  Medical Oncology/Hematology  PCP:  Celene Squibb, MD [place] [address] [phone]   REASON FOR VISIT:  Follow-up for prostate cancer  CURRENT THERAPY: Docetaxel   BRIEF ONCOLOGIC HISTORY:  Oncology History  Prostate cancer metastatic to bone ([place])  04/04/2016 Initial Diagnosis   Prostate cancer metastatic to bone ([place])   07/14/2019 Genetic Testing   Negative genetic testing on the common hereditary cancer panel.  The Common Hereditary Gene Panel offered by [facility] includes sequencing and/or deletion duplication testing of the following 48 genes: APC, ATM, AXIN2, BARD1, BMPR1A, BRCA1, BRCA2, BRIP1, CDH1, CDK4, CDKN2A (p14ARF), CDKN2A (p16INK4a), CHEK2, CTNNA1, DICER1, EPCAM (Deletion/duplication testing only), GREM1 (promoter region deletion/duplication testing only), KIT, MEN1, MLH1, MSH2, MSH3, MSH6, MUTYH, NBN, NF1, NHTL1, PALB2, PDGFRA, PMS2, POLD1, POLE, PTEN, RAD50, RAD51C, RAD51D, RNF43, SDHB, SDHC, SDHD, SMAD4, SMARCA4. STK11, TP53, TSC1, TSC2, and VHL.  The following genes were evaluated for sequence changes only: SDHA and HOXB13 c.251G>A variant only. The report date is July 14, 2019.   09/08/2019 -  Chemotherapy   The patient had pegfilgrastim (NEULASTA) injection 6 mg, 6 mg, Subcutaneous, Once, 3 of 4 cycles Administration: 6 mg (09/09/2019), 6 mg (10/04/2019) ondansetron (ZOFRAN) 8 mg in sodium chloride 0.9 % 50 mL IVPB, 8 mg (100 % of original dose 8 mg), Intravenous,  Once, 1 of 1 cycle Dose modification: 8 mg (original dose 8 mg, Cycle 1) Administration: 8 mg (09/08/2019) DOCEtaxel (TAXOTERE) 120 mg in sodium chloride 0.9 % 250 mL chemo infusion, 50 mg/m2 = 120 mg (66.7 % of original dose 75 mg/m2), Intravenous,  Once, 3 of 4 cycles Dose modification: 50 mg/m2 (66.7 % of original dose 75 mg/m2, Cycle 1, Reason: Provider Judgment) Administration: 120 mg (09/08/2019),  120 mg (09/30/2019), 120 mg (10/24/2019) ondansetron (ZOFRAN) 8 mg, dexamethasone (DECADRON) 10 mg in sodium chloride 0.9 % 50 mL IVPB, , Intravenous,  Once, 2 of 3 cycles Administration:  (09/30/2019),  (10/24/2019)  for chemotherapy treatment.        INTERVAL HISTORY:  [name] 78 y.o. male presents today for follow-up.  Reports overall doing fair.  He continues to report of mild to moderate fatigue.  He was recently seen in the emergency room on October 07, 2019 for back pain.  CT of lumbar spine was performed and was negative for any acute findings.  He reports back pain has resolved since incident.  He is currently on Docetaxol, tolerating well.  Reports appetite is somewhat decreased.  He is taking adequate amounts of nutrition.  Bowel habits are stable.  Denies any nausea.  Denies any numbness or tingling in extremities.    REVIEW OF SYSTEMS:  Review of Systems  Constitutional: Positive for fatigue.  HENT:  Negative.   Eyes: Negative.   Respiratory: Negative.   Cardiovascular: Negative.   Gastrointestinal: Negative.   Endocrine: Negative.   Genitourinary: Negative.    Musculoskeletal: Positive for arthralgias.  Skin: Negative.   Neurological: Negative.   Hematological: Negative.   Psychiatric/Behavioral: Negative.      PAST MEDICAL/SURGICAL HISTORY:  Past Medical History:  Diagnosis Date  . Cancer [facility])    prostate  . Family history of breast cancer   . Family history of prostate cancer   . GERD (gastroesophageal reflux disease)   . HOH (hard of hearing)   . Hyperlipidemia   . Hypertension   . Port-A-Cath in place  08/29/2019  . Prostate cancer metastatic to bone ([place]) 04/04/2016  . Sleep apnea 10/24/2016   Past Surgical History:  Procedure Laterality Date  . CATARACT EXTRACTION W/PHACO Right 04/22/2019   Procedure: CATARACT EXTRACTION PHACO AND INTRAOCULAR LENS PLACEMENT RIGHT EYE;  Surgeon: Baruch Goldmann, MD;  Location: [facility];  Service: Ophthalmology;   Laterality: Right;  right  . CATARACT EXTRACTION W/PHACO Left 05/13/2019   Procedure: CATARACT EXTRACTION PHACO AND INTRAOCULAR LENS PLACEMENT LEFT EYE;  Surgeon: Baruch Goldmann, MD;  Location: [facility];  Service: Ophthalmology;  Laterality: Left;  left  . CHOLECYSTECTOMY    . CYSTOSCOPY W/ URETERAL STENT PLACEMENT Left 08/17/2019   Procedure: CYSTOSCOPY WITH RETROGRADE PYELOGRAM/URETERAL STENT PLACEMENT;  Surgeon: Cleon Gustin, MD;  Location: [facility];  Service: Urology;  Laterality: Left;  . HAND SURGERY    . INGUINAL HERNIA REPAIR Left   . PORTACATH PLACEMENT Left 09/07/2019   Procedure: INSERTION PORT-A-CATH;  Surgeon: Virl Cagey, MD;  Location: [facility];  Service: General;  Laterality: Left;  . PROSTATECTOMY    . PROSTATECTOMY    . radical prostatectomy    . TONSILLECTOMY       SOCIAL HISTORY:  Social History   Socioeconomic History  . Marital status: Widowed    Spouse name: Not on file  . Number of children: 3  . Years of education: 9  . Highest education level: Not on file  Occupational History  . Occupation: Animator- retired    Comment: [employer]  Tobacco Use  . Smoking status: Former Smoker    Packs/day: 1.00    Years: 32.00    Pack years: 32.00    Types: Cigarettes    Quit date: 02/04/1989    Years since quitting: 30.7  . Smokeless tobacco: Never Used  Substance and Sexual Activity  . Alcohol use: No  . Drug use: No  . Sexual activity: Not Currently  Other Topics Concern  . Not on file  Social History Narrative   Widowed   Loves with son   Chronic low back pain   Social Determinants of Health   Financial Resource Strain:   . Difficulty of Paying Living Expenses: Not on file  Food Insecurity:   . Worried About Charity fundraiser in the Last Year: Not on file  . Ran Out of Food in the Last Year: Not on file  Transportation Needs:   . Lack of Transportation (Medical): Not on file  . Lack of Transportation (Non-Medical): Not on file   Physical Activity:   . Days of Exercise per Week: Not on file  . Minutes of Exercise per Session: Not on file  Stress:   . Feeling of Stress : Not on file  Social Connections:   . Frequency of Communication with Friends and Family: Not on file  . Frequency of Social Gatherings with Friends and Family: Not on file  . Attends Religious Services: Not on file  . Active Member of Clubs or Organizations: Not on file  . Attends Archivist Meetings: Not on file  . Marital Status: Not on file  Intimate Partner Violence:   . Fear of Current or Ex-Partner: Not on file  . Emotionally Abused: Not on file  . Physically Abused: Not on file  . Sexually Abused: Not on file    FAMILY HISTORY:  Family History  Problem Relation Age of Onset  . Heart failure Mother        CHF  . Cancer Mother  unknown  . Prostate cancer Father   . Narcolepsy Sister   . Cancer Daughter        unknown  . Breast cancer Maternal Aunt   . Cancer Maternal Grandmother        unknown  . Cancer Maternal Aunt        unknown type of cancer    CURRENT MEDICATIONS:  Outpatient Encounter Medications as of 10/24/2019  Medication Sig  . benazepril (LOTENSIN) 20 MG tablet TAKE ONE TABLET BY MOUTH DAILY.  . calcium-vitamin D (OSCAL WITH D) 500-200 MG-UNIT TABS tablet Take 1 tablet by mouth daily.  . cetirizine (ZYRTEC) 10 MG tablet TAKE ONE TABLET BY MOUTH DAILY.  . clotrimazole-betamethasone (LOTRISONE) cream Apply 1 application topically 2 (two) times daily.  . DOCETAXEL IV Inject into the vein every 21 ( twenty-one) days.  . Ferrous Sulfate (IRON) 325 (65 Fe) MG TABS Take 1 tablet by mouth daily.  . fluticasone (FLONASE) 50 MCG/ACT nasal spray USE ONE SPRAY IN EACH NOSTRIL ONCE DAILY. SHAKE GENTLY BEFORE USING.  . furosemide (LASIX) 40 MG tablet TAKE ONE TABLET BY MOUTH DAILY. TAKE WITH POTASSIUM.  Marland Kitchen hydrochlorothiazide (HYDRODIURIL) 25 MG tablet TAKE ONE TABLET BY MOUTH DAILY.  Marland Kitchen ketorolac (ACULAR) 0.5  % ophthalmic solution INSTILL ONE DROP IN THE OPERATIVE EYE TWICE DAILY.  Marland Kitchen lidocaine-prilocaine (EMLA) cream Apply small amount to port a cath site and cover with plastic wrap 1 hour prior to chemotherapy appointments  . losartan (COZAAR) 100 MG tablet Take 100 mg by mouth daily.  . meloxicam (MOBIC) 7.5 MG tablet Take 1 tablet (7.5 mg total) daily by mouth.  . methylphenidate (RITALIN) 20 MG tablet Take 20 mg by mouth daily.  . mirabegron ER (MYRBETRIQ) 25 MG TB24 tablet Take by mouth.  . moxifloxacin (VIGAMOX) 0.5 % ophthalmic solution INSTILL ONE DROP THREE TIMES DAILY IN THE OPERATIVE EYE. START TWO DAYS BEFORE SURGERY AND CONTINUE AS DIRECTED.  Marland Kitchen potassium chloride (KLOR-CON) 20 MEQ packet Take by mouth.  . ILEVRO 0.3 % ophthalmic suspension INSTILL ONE DROP IN THE OPERATIVE EYE DAILY. START TWO DAYS BEFORE SURGERY AND CONTINUE AS DIRECTED.  Marland Kitchen ketoconazole (NIZORAL) 2 % cream APPLY A SMALL AMOUNT TO FACE RASH TWICE DAILY FOR UP TO FOUR WEEKS IF NEEDED.  Marland Kitchen prednisoLONE acetate (PRED FORTE) 1 % ophthalmic suspension INSTILL ONE DROP IN THE OPERATIVE EYE THREE TIMES DAILY. START ONE DAY AFTER SURGERY AND CONTINUE AS DIRECTED.  Marland Kitchen prochlorperazine (COMPAZINE) 10 MG tablet Take 1 tablet (10 mg total) by mouth every 6 (six) hours as needed for nausea or vomiting. (Patient not taking: Reported on 10/24/2019)  . selenium sulfide (SELSUN) 2.5 % shampoo Apply 1 application topically daily as needed for irritation. (Patient not taking: Reported on 10/24/2019)  . traMADol (ULTRAM) 50 MG tablet Take 1 tablet (50 mg total) by mouth every 6 (six) hours as needed. (Patient not taking: Reported on 10/24/2019)   No facility-administered encounter medications on file as of 10/24/2019.    ALLERGIES:  No Known Allergies   PHYSICAL EXAM:  ECOG Performance status: 2  Vitals:   10/24/19 0942 10/24/19 1002  BP: 118/61 118/61  Pulse: 86 86  Resp: 18 18  Temp: 97.8 F (36.6 C) 97.8 F (36.6 C)  SpO2: 99%  99%   Filed Weights   10/24/19 0942 10/24/19 1002  Weight: 273 lb (123.8 kg) 273 lb (123.8 kg)    Physical Exam Constitutional:      Appearance: Normal appearance. He is obese.  HENT:     Head: Normocephalic.     Right Ear: External ear normal.     Left Ear: External ear normal.     Nose: Nose normal.  Eyes:     Conjunctiva/sclera: Conjunctivae normal.  Cardiovascular:     Rate and Rhythm: Normal rate and regular rhythm.     Pulses: Normal pulses.     Heart sounds: Normal heart sounds.  Pulmonary:     Effort: Pulmonary effort is normal.     Breath sounds: Normal breath sounds.  Abdominal:     General: Bowel sounds are normal.  Musculoskeletal:        General: Normal range of motion.     Cervical back: Normal range of motion.     Comments: Decreased range of motion  Skin:    General: Skin is warm.  Neurological:     General: No focal deficit present.     Mental Status: He is alert and oriented to person, place, and time.  Psychiatric:        Mood and Affect: Mood normal.        Behavior: Behavior normal.      LABORATORY DATA:  I have reviewed the labs as listed.  CBC    Component Value Date/Time   WBC 10.2 10/24/2019 0829   RBC 3.32 (L) 10/24/2019 0829   HGB 9.6 (L) 10/24/2019 0829   HCT 30.2 (L) 10/24/2019 0829   PLT 296 10/24/2019 0829   MCV 91.0 10/24/2019 0829   MCH 28.9 10/24/2019 0829   MCHC 31.8 10/24/2019 0829   RDW 17.2 (H) 10/24/2019 0829   LYMPHSABS 1.7 10/24/2019 0829   MONOABS 0.5 10/24/2019 0829   EOSABS 2.3 (H) 10/24/2019 0829   BASOSABS 0.1 10/24/2019 0829   CMP Latest Ref Rng & Units 10/24/2019 10/07/2019 09/30/2019  Glucose 70 - 99 mg/dL 94 107(H) 86  BUN 8 - 23 mg/dL _0 Creatinine 0.61 - 1.24 mg/dL 1.67(H) 1.95(H) 1.65(H)  Sodium 135 - 145 mmol/L 137 135 137  Potassium 3.5 - 5.1 mmol/L 3.7 4.3 4.0  Chloride 98 - 111 mmol/L 102 100 105  CO2 22 - 32 mmol/L _1 Calcium 8.9 - 10.3 mg/dL 8.8(L) 8.8(L) 8.9  Total Protein  6.5 - 8.1 g/dL 6.9 - 7.1  Total Bilirubin 0.3 - 1.2 mg/dL 0.7 - 0.6  Alkaline Phos 38 - 126 U/L 86 - 89  AST 15 - 41 U/L 22 - 33  ALT 0 - 44 U/L 16 - 25          ASSESSMENT & PLAN:   Prostate cancer metastatic to bone ([place]) 1.  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 -Enzalutamide 160 mg daily started towards the end of November 2019 and discontinued in September 2020 upon progression. 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  No evidence of skeletal metastasis.  Chronic compression fracture of T8. -Germline mutation testing was negative.  Guardant 360 did not show any FDA approved therapies. -Bone scan on 07/27/2019 shows uptake at T8.  No other uptake.  PSA today 16.8. -Cycle 1 of chemotherapy with docetaxel 50 mg/m2 with Neulasta support on 09/08/2019. -He did not experience any  nausea or vomiting.  He had mild diarrhea. -He fell on 09/12/2019 and presented to the ER.  X-ray of the right ankle showed small evulsion fracture of the anterior dorsal talus.  He has follow-up with orthopedics. -Labs are acceptable today to proceed with cycle 2 of docetaxel.  Creatinine improved 1.65. -Seen in the emergency room on 1211 for lumbar back pain.  CT of lumbar spine was negative for any acute findings.  Back pain has since resolved. -Labs are acceptable to proceed with cycle 3 today.  He will need a PSA prior to his next visit. -He will return to clinic in 3 weeks.  2.  Bone strengthening: -He will continue denosumab started on 03/05/2017.  He will continue calcium and vitamin D.  3.  CKD: -Serum creatinine is stable.  We will  continue to monitor and dose adjust accordingly.       [place] [phone]

## 2019-10-24 NOTE — Progress Notes (Signed)
Labs reviewed with Reynolds Bowl NP, Creatinine 1.67 noted by NP. Will proceed with treatment today per NP.     1155-blood return after alteplase administration.   xgeva injection given per orders. See MAR  Treatment given per orders. Patient tolerated it well without problems. Vitals stable and discharged home from clinic ambulatory. Follow up as scheduled.

## 2019-10-24 NOTE — Patient Instructions (Signed)
[facility] Discharge Instructions for Patients Receiving Chemotherapy  Today you received the following chemotherapy agents   To help prevent nausea and vomiting after your treatment, we encourage you to take your nausea medication   If you develop nausea and vomiting that is not controlled by your nausea medication, call the clinic.   BELOW ARE SYMPTOMS THAT SHOULD BE REPORTED IMMEDIATELY:  *FEVER GREATER THAN 100.5 F  *CHILLS WITH OR WITHOUT FEVER  NAUSEA AND VOMITING THAT IS NOT CONTROLLED WITH YOUR NAUSEA MEDICATION  *UNUSUAL SHORTNESS OF BREATH  *UNUSUAL BRUISING OR BLEEDING  TENDERNESS IN MOUTH AND THROAT WITH OR WITHOUT PRESENCE OF ULCERS  *URINARY PROBLEMS  *BOWEL PROBLEMS  UNUSUAL RASH Items with * indicate a potential emergency and should be followed up as soon as possible.  Feel free to call the clinic should you have any questions or concerns. The clinic phone number is [phone].  Please show the CHEMO ALERT CARD at check-in to the Emergency Department and triage nurse.   

## 2019-10-24 NOTE — Progress Notes (Signed)
1120-no blood return from port thus far. Will do Alteplase protocol and further evaluate.

## 2019-10-26 ENCOUNTER — Ambulatory Visit (HOSPITAL_COMMUNITY): Payer: Medicare Other

## 2019-10-26 DIAGNOSIS — R918 Other nonspecific abnormal finding of lung field: Secondary | ICD-10-CM | POA: Diagnosis not present

## 2019-10-26 DIAGNOSIS — I6782 Cerebral ischemia: Secondary | ICD-10-CM | POA: Diagnosis not present

## 2019-10-26 DIAGNOSIS — I4510 Unspecified right bundle-branch block: Secondary | ICD-10-CM | POA: Diagnosis not present

## 2019-10-26 DIAGNOSIS — Z87891 Personal history of nicotine dependence: Secondary | ICD-10-CM | POA: Diagnosis not present

## 2019-10-26 DIAGNOSIS — N289 Disorder of kidney and ureter, unspecified: Secondary | ICD-10-CM | POA: Diagnosis not present

## 2019-10-26 DIAGNOSIS — I10 Essential (primary) hypertension: Secondary | ICD-10-CM | POA: Diagnosis not present

## 2019-10-31 ENCOUNTER — Inpatient Hospital Stay (HOSPITAL_COMMUNITY): Payer: Medicare HMO | Attending: Hematology

## 2019-10-31 ENCOUNTER — Other Ambulatory Visit: Payer: Self-pay

## 2019-10-31 ENCOUNTER — Other Ambulatory Visit (HOSPITAL_COMMUNITY): Payer: Self-pay | Admitting: *Deleted

## 2019-10-31 ENCOUNTER — Inpatient Hospital Stay (HOSPITAL_COMMUNITY): Payer: Medicare HMO

## 2019-10-31 ENCOUNTER — Inpatient Hospital Stay (HOSPITAL_BASED_OUTPATIENT_CLINIC_OR_DEPARTMENT_OTHER): Payer: Medicare HMO | Admitting: Nurse Practitioner

## 2019-10-31 VITALS — BP 110/51 | HR 88 | Temp 97.6°F | Resp 18

## 2019-10-31 DIAGNOSIS — C7951 Secondary malignant neoplasm of bone: Secondary | ICD-10-CM | POA: Diagnosis not present

## 2019-10-31 DIAGNOSIS — Z452 Encounter for adjustment and management of vascular access device: Secondary | ICD-10-CM | POA: Diagnosis not present

## 2019-10-31 DIAGNOSIS — C61 Malignant neoplasm of prostate: Secondary | ICD-10-CM

## 2019-10-31 DIAGNOSIS — Z95828 Presence of other vascular implants and grafts: Secondary | ICD-10-CM

## 2019-10-31 DIAGNOSIS — Z5189 Encounter for other specified aftercare: Secondary | ICD-10-CM | POA: Insufficient documentation

## 2019-10-31 LAB — CBC WITH DIFFERENTIAL/PLATELET
Abs Immature Granulocytes: 0.02 10*3/uL (ref 0.00–0.07)
Basophils Absolute: 0.1 10*3/uL (ref 0.0–0.1)
Basophils Relative: 2 %
Eosinophils Absolute: 0.1 10*3/uL (ref 0.0–0.5)
Eosinophils Relative: 4 %
HCT: 29.7 % — ABNORMAL LOW (ref 39.0–52.0)
Hemoglobin: 9.6 g/dL — ABNORMAL LOW (ref 13.0–17.0)
Immature Granulocytes: 1 %
Lymphocytes Relative: 36 %
Lymphs Abs: 1.4 10*3/uL (ref 0.7–4.0)
MCH: 29.4 pg (ref 26.0–34.0)
MCHC: 32.3 g/dL (ref 30.0–36.0)
MCV: 91.1 fL (ref 80.0–100.0)
Monocytes Absolute: 0.2 10*3/uL (ref 0.1–1.0)
Monocytes Relative: 6 %
Neutro Abs: 2 10*3/uL (ref 1.7–7.7)
Neutrophils Relative %: 51 %
Platelets: 243 10*3/uL (ref 150–400)
RBC: 3.26 MIL/uL — ABNORMAL LOW (ref 4.22–5.81)
RDW: 17 % — ABNORMAL HIGH (ref 11.5–15.5)
WBC: 3.9 10*3/uL — ABNORMAL LOW (ref 4.0–10.5)
nRBC: 0 % (ref 0.0–0.2)

## 2019-10-31 LAB — COMPREHENSIVE METABOLIC PANEL
ALT: 19 U/L (ref 0–44)
AST: 24 U/L (ref 15–41)
Albumin: 4.1 g/dL (ref 3.5–5.0)
Alkaline Phosphatase: 79 U/L (ref 38–126)
Anion gap: 11 (ref 5–15)
BUN: 24 mg/dL — ABNORMAL HIGH (ref 8–23)
CO2: 21 mmol/L — ABNORMAL LOW (ref 22–32)
Calcium: 8.8 mg/dL — ABNORMAL LOW (ref 8.9–10.3)
Chloride: 103 mmol/L (ref 98–111)
Creatinine, Ser: 1.92 mg/dL — ABNORMAL HIGH (ref 0.61–1.24)
GFR calc Af Amer: 37 mL/min — ABNORMAL LOW (ref >60)
GFR calc non Af Amer: 32 mL/min — ABNORMAL LOW (ref >60)
Glucose, Bld: 111 mg/dL — ABNORMAL HIGH (ref 70–99)
Potassium: 4.3 mmol/L (ref 3.5–5.1)
Sodium: 135 mmol/L (ref 135–145)
Total Bilirubin: 0.8 mg/dL (ref 0.3–1.2)
Total Protein: 7.5 g/dL (ref 6.5–8.1)

## 2019-10-31 LAB — MAGNESIUM: Magnesium: 2.3 mg/dL (ref 1.7–2.4)

## 2019-10-31 MED ORDER — SODIUM CHLORIDE 0.9% FLUSH
10.0000 mL | Freq: Once | INTRAVENOUS | Status: AC
  Administered 2019-10-31: 14:00:00 10 mL

## 2019-10-31 MED ORDER — SODIUM CHLORIDE 0.9 % IV SOLN: INTRAVENOUS | Status: DC

## 2019-10-31 MED ORDER — PEGFILGRASTIM INJECTION 6 MG/0.6ML ~~LOC~~
6.0000 mg | PREFILLED_SYRINGE | Freq: Once | SUBCUTANEOUS | Status: AC
  Administered 2019-10-31: 6 mg via SUBCUTANEOUS

## 2019-10-31 MED ORDER — HEPARIN SOD (PORK) LOCK FLUSH 100 UNIT/ML IV SOLN
500.0000 [IU] | Freq: Once | INTRAVENOUS | Status: AC
  Administered 2019-10-31: 500 [IU] via INTRAVENOUS

## 2019-10-31 MED ORDER — PEGFILGRASTIM INJECTION 6 MG/0.6ML ~~LOC~~
PREFILLED_SYRINGE | SUBCUTANEOUS | Status: AC
  Filled 2019-10-31: qty 0.6

## 2019-10-31 NOTE — Progress Notes (Signed)
[name], [place] [address]   CLINIC:  Medical Oncology/Hematology  PCP:  Celene Squibb, MD [place] [id number] [phone]   REASON FOR VISIT: Follow-up for metastatic prostate cancer  CURRENT THERAPY: Docetaxel  BRIEF ONCOLOGIC HISTORY:  Oncology History  Prostate cancer metastatic to bone ([place])  04/04/2016 Initial Diagnosis   Prostate cancer metastatic to bone ([place])   07/14/2019 Genetic Testing   Negative genetic testing on the common hereditary cancer panel.  The Common Hereditary Gene Panel offered by [facility] includes sequencing and/or deletion duplication testing of the following 48 genes: APC, ATM, AXIN2, BARD1, BMPR1A, BRCA1, BRCA2, BRIP1, CDH1, CDK4, CDKN2A (p14ARF), CDKN2A (p16INK4a), CHEK2, CTNNA1, DICER1, EPCAM (Deletion/duplication testing only), GREM1 (promoter region deletion/duplication testing only), KIT, MEN1, MLH1, MSH2, MSH3, MSH6, MUTYH, NBN, NF1, NHTL1, PALB2, PDGFRA, PMS2, POLD1, POLE, PTEN, RAD50, RAD51C, RAD51D, RNF43, SDHB, SDHC, SDHD, SMAD4, SMARCA4. STK11, TP53, TSC1, TSC2, and VHL.  The following genes were evaluated for sequence changes only: SDHA and HOXB13 c.251G>A variant only. The report date is July 14, 2019.   09/08/2019 -  Chemotherapy   The patient had pegfilgrastim (NEULASTA) injection 6 mg, 6 mg, Subcutaneous, Once, 3 of 4 cycles Administration: 6 mg (09/09/2019), 6 mg (10/04/2019) ondansetron (ZOFRAN) 8 mg in sodium chloride 0.9 % 50 mL IVPB, 8 mg (100 % of original dose 8 mg), Intravenous,  Once, 1 of 1 cycle Dose modification: 8 mg (original dose 8 mg, Cycle 1) Administration: 8 mg (09/08/2019) DOCEtaxel (TAXOTERE) 120 mg in sodium chloride 0.9 % 250 mL chemo infusion, 50 mg/m2 = 120 mg (66.7 % of original dose 75 mg/m2), Intravenous,  Once, 3 of 4 cycles Dose modification: 50 mg/m2 (66.7 % of original dose 75 mg/m2, Cycle 1, Reason: Provider Judgment) Administration: 120 mg  (09/08/2019), 120 mg (09/30/2019), 120 mg (10/24/2019) ondansetron (ZOFRAN) 8 mg, dexamethasone (DECADRON) 10 mg in sodium chloride 0.9 % 50 mL IVPB, , Intravenous,  Once, 2 of 3 cycles Administration:  (09/30/2019),  (10/24/2019)  for chemotherapy treatment.       INTERVAL HISTORY:  [name] 79 y.o. male returns for routine follow-up for metastatic prostate cancer.  Patient reports he is doing well since his last visit.  He denies any new complaints today.  His shortness of breath is the same. Denies any nausea, vomiting, or diarrhea. Denies any new pains. Had not noticed any recent bleeding such as epistaxis, hematuria or hematochezia. Denies recent chest pain on exertion, shortness of breath on minimal exertion, pre-syncopal episodes, or palpitations. Denies any numbness or tingling in hands or feet. Denies any recent fevers, infections, or recent hospitalizations. Patient reports appetite at 25% and energy level at 50%.  He is eating well maintained his weight at this time.    REVIEW OF SYSTEMS:  Review of Systems  Respiratory: Positive for shortness of breath.   All other systems reviewed and are negative.    PAST MEDICAL/SURGICAL HISTORY:  Past Medical History:  Diagnosis Date  . Cancer [facility])    prostate  . Family history of breast cancer   . Family history of prostate cancer   . GERD (gastroesophageal reflux disease)   . HOH (hard of hearing)   . Hyperlipidemia   . Hypertension   . Port-A-Cath in place 08/29/2019  . Prostate cancer metastatic to bone ([place]) 04/04/2016  . Sleep apnea 10/24/2016   Past Surgical History:  Procedure Laterality Date  . CATARACT EXTRACTION W/PHACO Right  04/22/2019   Procedure: CATARACT EXTRACTION PHACO AND INTRAOCULAR LENS PLACEMENT RIGHT EYE;  Surgeon: Baruch Goldmann, MD;  Location: [facility];  Service: Ophthalmology;  Laterality: Right;  right  . CATARACT EXTRACTION W/PHACO Left 05/13/2019   Procedure: CATARACT EXTRACTION PHACO AND INTRAOCULAR LENS  PLACEMENT LEFT EYE;  Surgeon: Baruch Goldmann, MD;  Location: [facility] ORS;  Service: Ophthalmology;  Laterality: Left;  left  . CHOLECYSTECTOMY    . CYSTOSCOPY W/ URETERAL STENT PLACEMENT Left 08/17/2019   Procedure: CYSTOSCOPY WITH RETROGRADE PYELOGRAM/URETERAL STENT PLACEMENT;  Surgeon: Cleon Gustin, MD;  Location: [facility] ORS;  Service: Urology;  Laterality: Left;  . HAND SURGERY    . INGUINAL HERNIA REPAIR Left   . PORTACATH PLACEMENT Left 09/07/2019   Procedure: INSERTION PORT-A-CATH;  Surgeon: [name] Cagey, MD;  Location: [facility] OR[facility];  Service: General;  Laterality: Left;  . PROSTATECTOMY    . PROSTATECTOMY    . radical prostatectomy    . TONSILLECTOMY       SOCIAL HISTORY:  Social History   Socioeconomic History  . Marital status: Widowed    Spouse name: Not on file  . Number of children: 3  . Years of education: 9  . Highest education level: Not on file  Occupational History  . Occupation: Animator- retired    Comment: [employer]  Tobacco Use  . Smoking status: Former Smoker    Packs/day: 1.00    Years: 32.00    Pack years: 32.00    Types: Cigarettes    Quit date: 02/04/1989    Years since quitting: 30.7  . Smokeless tobacco: Never Used  Substance and Sexual Activity  . Alcohol use: No  . Drug use: No  . Sexual activity: Not Currently  Other Topics Concern  . Not on file  Social History Narrative   Widowed   Loves with son   Chronic low back pain   Social Determinants of Health   Financial Resource Strain:   . Difficulty of Paying Living Expenses: Not on file  Food Insecurity:   . Worried About Charity fundraiser in the Last Year: Not on file  . Ran Out of Food in the Last Year: Not on file  Transportation Needs:   . Lack of Transportation (Medical): Not on file  . Lack of Transportation (Non-Medical): Not on file  Physical Activity:   . Days of Exercise per Week: Not on file  . Minutes of Exercise per Session: Not on file  Stress:   . Feeling  of Stress : Not on file  Social Connections:   . Frequency of Communication with Friends and Family: Not on file  . Frequency of Social Gatherings with Friends and Family: Not on file  . Attends Religious Services: Not on file  . Active Member of Clubs or Organizations: Not on file  . Attends Archivist Meetings: Not on file  . Marital Status: Not on file  Intimate Partner Violence:   . Fear of Current or Ex-Partner: Not on file  . Emotionally Abused: Not on file  . Physically Abused: Not on file  . Sexually Abused: Not on file    FAMILY HISTORY:  Family History  Problem Relation Age of Onset  . Heart failure Mother        CHF  . Cancer Mother        unknown  . Prostate cancer Father   . Narcolepsy Sister   . Cancer Daughter        unknown  .  Breast cancer Maternal Aunt   . Cancer Maternal Grandmother        unknown  . Cancer Maternal Aunt        unknown type of cancer    CURRENT MEDICATIONS:  Outpatient Encounter Medications as of 10/31/2019  Medication Sig  . benazepril (LOTENSIN) 20 MG tablet TAKE ONE TABLET BY MOUTH DAILY.  . calcium-vitamin D (OSCAL WITH D) 500-200 MG-UNIT TABS tablet Take 1 tablet by mouth daily.  . cetirizine (ZYRTEC) 10 MG tablet TAKE ONE TABLET BY MOUTH DAILY.  . clotrimazole-betamethasone (LOTRISONE) cream Apply 1 application topically 2 (two) times daily.  . DOCETAXEL IV Inject into the vein every 21 ( twenty-one) days.  . Ferrous Sulfate (IRON) 325 (65 Fe) MG TABS Take 1 tablet by mouth daily.  . fluticasone (FLONASE) 50 MCG/ACT nasal spray USE ONE SPRAY IN EACH NOSTRIL ONCE DAILY. SHAKE GENTLY BEFORE USING.  . furosemide (LASIX) 40 MG tablet TAKE ONE TABLET BY MOUTH DAILY. TAKE WITH POTASSIUM.  Marland Kitchen hydrochlorothiazide (HYDRODIURIL) 25 MG tablet TAKE ONE TABLET BY MOUTH DAILY.  Marland Kitchen ILEVRO 0.3 % ophthalmic suspension INSTILL ONE DROP IN THE OPERATIVE EYE DAILY. START TWO DAYS BEFORE SURGERY AND CONTINUE AS DIRECTED.  Marland Kitchen ketoconazole  (NIZORAL) 2 % cream APPLY A SMALL AMOUNT TO FACE RASH TWICE DAILY FOR UP TO FOUR WEEKS IF NEEDED.  Marland Kitchen ketorolac (ACULAR) 0.5 % ophthalmic solution INSTILL ONE DROP IN THE OPERATIVE EYE TWICE DAILY.  Marland Kitchen losartan (COZAAR) 100 MG tablet Take 100 mg by mouth daily.  . meloxicam (MOBIC) 7.5 MG tablet Take 1 tablet (7.5 mg total) daily by mouth.  . methylphenidate (RITALIN) 20 MG tablet Take 20 mg by mouth daily.  . mirabegron ER (MYRBETRIQ) 25 MG TB24 tablet Take by mouth.  . moxifloxacin (VIGAMOX) 0.5 % ophthalmic solution INSTILL ONE DROP THREE TIMES DAILY IN THE OPERATIVE EYE. START TWO DAYS BEFORE SURGERY AND CONTINUE AS DIRECTED.  Marland Kitchen potassium chloride (KLOR-CON) 20 MEQ packet Take by mouth.  . lidocaine-prilocaine (EMLA) cream Apply small amount to port a cath site and cover with plastic wrap 1 hour prior to chemotherapy appointments (Patient not taking: Reported on 10/31/2019)  . prednisoLONE acetate (PRED FORTE) 1 % ophthalmic suspension INSTILL ONE DROP IN THE OPERATIVE EYE THREE TIMES DAILY. START ONE DAY AFTER SURGERY AND CONTINUE AS DIRECTED.  Marland Kitchen prochlorperazine (COMPAZINE) 10 MG tablet Take 1 tablet (10 mg total) by mouth every 6 (six) hours as needed for nausea or vomiting. (Patient not taking: Reported on 10/24/2019)  . selenium sulfide (SELSUN) 2.5 % shampoo Apply 1 application topically daily as needed for irritation. (Patient not taking: Reported on 10/24/2019)  . traMADol (ULTRAM) 50 MG tablet Take 1 tablet (50 mg total) by mouth every 6 (six) hours as needed. (Patient not taking: Reported on 10/24/2019)   No facility-administered encounter medications on file as of 10/31/2019.    ALLERGIES:  No Known Allergies   PHYSICAL EXAM:  ECOG Performance status: 1  Vitals:   10/31/19 1136  BP: 139/71  Pulse: 82  Resp: 18  Temp: 98.3 F (36.8 C)  SpO2: 100%   Filed Weights   10/31/19 1136  Weight: 264 lb (119.7 kg)    Physical Exam Constitutional:      Appearance: He is obese.    Cardiovascular:     Rate and Rhythm: Regular rhythm.     Heart sounds: Normal heart sounds.  Pulmonary:     Effort: Pulmonary effort is normal.     Breath sounds:  Normal breath sounds.  Abdominal:     General: Bowel sounds are normal.     Palpations: Abdomen is soft.  Musculoskeletal:        General: Normal range of motion.  Skin:    General: Skin is warm.  Neurological:     Mental Status: He is alert and oriented to person, place, and time. Mental status is at baseline.  Psychiatric:        Mood and Affect: Mood normal.        Behavior: Behavior normal.        Thought Content: Thought content normal.        Judgment: Judgment normal.      LABORATORY DATA:  I have reviewed the labs as listed.  CBC    Component Value Date/Time   WBC 3.9 (L) 10/31/2019 1051   RBC 3.26 (L) 10/31/2019 1051   HGB 9.6 (L) 10/31/2019 1051   HCT 29.7 (L) 10/31/2019 1051   PLT 243 10/31/2019 1051   MCV 91.1 10/31/2019 1051   MCH 29.4 10/31/2019 1051   MCHC 32.3 10/31/2019 1051   RDW 17.0 (H) 10/31/2019 1051   LYMPHSABS 1.4 10/31/2019 1051   MONOABS 0.2 10/31/2019 1051   EOSABS 0.1 10/31/2019 1051   BASOSABS 0.1 10/31/2019 1051   CMP Latest Ref Rng & Units 10/31/2019 10/24/2019 10/07/2019  Glucose 70 - 99 mg/dL 111(H) 94 107(H)  BUN 8 - 23 mg/dL 24(H) 18 22  Creatinine 0.61 - 1.24 mg/dL 1.92(H) 1.67(H) 1.95(H)  Sodium 135 - 145 mmol/L 135 137 135  Potassium 3.5 - 5.1 mmol/L 4.3 3.7 4.3  Chloride 98 - 111 mmol/L 103 102 100  CO2 22 - 32 mmol/L 21(L) 24 25  Calcium 8.9 - 10.3 mg/dL 8.8(L) 8.8(L) 8.8(L)  Total Protein 6.5 - 8.1 g/dL 7.5 6.9 -  Total Bilirubin 0.3 - 1.2 mg/dL 0.8 0.7 -  Alkaline Phos 38 - 126 U/L 79 86 -  AST 15 - 41 U/L 24 22 -  ALT 0 - 44 U/L 19 16 -    I personally performed a face-to-face visit.  All questions were answered to patient's stated satisfaction. Encouraged patient to call with any new concerns or questions before his next visit to the cancer center and we  can certain see him sooner, if needed.     ASSESSMENT & PLAN:   Prostate cancer metastatic to bone ([place]) 1.  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 -Enzalutamide 119m daily started towards the end of November 2019 and discontinued in September 2020 upon progression. -CT CAP on 06/30/2019 showed 3.4 x 5.2 cm left iliac lymphadenopathy, progressed from prior scan.  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  No evidence of skeletal metastasis.  Chronic compression fracture of T8. -Germline mutation testing was negative.  Guardant 360 did not show any FDA approved therapies. -Bone scan on 07/27/2019 showed uptake at T8.  No other uptake.  Last PSA was 16.8. -Cycle 1 of chemotherapy with docetaxel 50 mg/m with Neulasta support on 09/08/2019. -He did not experience any nausea or vomiting and had very mild diarrhea. -He fell on 09/12/2019 and presented to the ER.  X-rays of the right ankle showed small avulsion fracture of the anterior dorsal talus.  He will follow up with orthopedics. -He received  his second cycle of docetaxel. -He was seen back in the emergency room on 10/07/2019 for lumbar back pain.  CT of lumbar spine was negative for any acute findings.  Back pain has since resolved. -He received his cycle 3 docetaxel. -Labs done today on 10/31/2019 showed potassium 4.3, creatinine has slightly increased to 1.92, WBC 3.9, hemoglobin 9.6, platelets 243 -We will give a Neulasta injection today along with 1 L of fluids. -She will return to clinic in 2 weeks with repeat labs and office visit and treatment.  2.  Bone strengthening: -She will continue denosumab started on 03/05/2017. -She will continue take calcium and vitamin D  daily.  3.  CKD: -Serum creatinine is stable. -Labs done on 10/30/2018 showed creatinine has slightly increased to 1.92 -We will continue to monitor.      Orders placed this encounter:  Orders Placed This Encounter  Procedures  . PSA  . Lactate dehydrogenase  . Magnesium  . CBC with Differential/Platelet  . Comprehensive metabolic panel      Francene Finders, FNP-C [place] [phone]

## 2019-10-31 NOTE — Patient Instructions (Signed)
[facility] at [facility]  Discharge Instructions:   _______________________________________________________________  Thank you for choosing [facility] at [facility] to provide your oncology and hematology care.  To afford each patient quality time with our providers, please arrive at least 15 minutes before your scheduled appointment.  You need to re-schedule your appointment if you arrive 10 or more minutes late.  We strive to give you quality time with our providers, and arriving late affects you and other patients whose appointments are after yours.  Also, if you no show three or more times for appointments you may be dismissed from the clinic.  Again, thank you for choosing [facility] at [facility]. Our hope is that these requests will allow you access to exceptional care and in a timely manner. _______________________________________________________________  If you have questions after your visit, please contact our office at [phone] between the hours of 8:30 a.m. and 5:00 p.m. Voicemails left after 4:30 p.m. will not be returned until the following business day. _______________________________________________________________  For prescription refill requests, have your pharmacy contact our office. _______________________________________________________________  Recommendations made by the consultant and any test results will be sent to your referring physician. _______________________________________________________________ 

## 2019-10-31 NOTE — Patient Instructions (Signed)
[place] at [facility] Discharge Instructions  Follow-up in 2 weeks with labs treatment and office visit.   Thank you for choosing [place] at [facility] to provide your oncology and hematology care.  To afford each patient quality time with our provider, please arrive at least 15 minutes before your scheduled appointment time.   If you have a lab appointment with the [facility] please come in thru the Main Entrance and check in at the main information desk.  You need to re-schedule your appointment should you arrive 10 or more minutes late.  We strive to give you quality time with our providers, and arriving late affects you and other patients whose appointments are after yours.  Also, if you no show three or more times for appointments you may be dismissed from the clinic at the providers discretion.     Again, thank you for choosing [facility].  Our hope is that these requests will decrease the amount of time that you wait before being seen by our physicians.       _____________________________________________________________  Should you have questions after your visit to [facility], please contact our office at [phone] between the hours of 8:00 a.m. and 4:30 p.m.  Voicemails left after 4:00 p.m. will not be returned until the following business day.  For prescription refill requests, have your pharmacy contact our office and allow 72 hours.    Due to Covid, you will need to wear a mask upon entering the hospital. If you do not have a mask, a mask will be given to you at the Main Entrance upon arrival. For doctor visits, patients may have 1 support person with them. For treatment visits, patients can not have anyone with them due to social distancing guidelines and our immunocompromised population.

## 2019-10-31 NOTE — Assessment & Plan Note (Addendum)
1.  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 -Enzalutamide 160mg  daily started towards the end of November 2019 and discontinued in September 2020 upon progression. -CT CAP on 06/30/2019 showed 3.4 x 5.2 cm left iliac lymphadenopathy, progressed from prior scan.  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  No evidence of skeletal metastasis.  Chronic compression fracture of T8. -Germline mutation testing was negative.  Guardant 360 did not show any FDA approved therapies. -Bone scan on 07/27/2019 showed uptake at T8.  No other uptake.  Last PSA was 16.8. -Cycle 1 of chemotherapy with docetaxel 50 mg/m with Neulasta support on 09/08/2019. -He did not experience any nausea or vomiting and had very mild diarrhea. -He fell on 09/12/2019 and presented to the ER.  X-rays of the right ankle showed small avulsion fracture of the anterior dorsal talus.  He will follow up with orthopedics. -He received his second cycle of docetaxel. -He was seen back in the emergency room on 10/07/2019 for lumbar back pain.  CT of lumbar spine was negative for any acute findings.  Back pain has since resolved. -He received his cycle 3 docetaxel. -Labs done today on 10/31/2019 showed potassium 4.3, creatinine has slightly increased to 1.92, WBC 3.9, hemoglobin 9.6, platelets 243 -We will give a Neulasta injection today along with 1 L of fluids. -She will return to clinic in 2 weeks with repeat labs and office visit and treatment.  2.  Bone strengthening: -She will continue denosumab started on 03/05/2017. -She will continue take calcium and vitamin D daily.  3.  CKD: -Serum creatinine is stable. -Labs done on 10/30/2018 showed creatinine has slightly increased to  1.92 -We will continue to monitor.

## 2019-10-31 NOTE — Progress Notes (Signed)
1 Liter of Normal Saline given today per MD orders. Tolerated infusion without adverse affects. Vital signs stable. No complaints at this time. Discharged from clinic ambulatory. F/U with [facility] as scheduled.  

## 2019-11-14 ENCOUNTER — Inpatient Hospital Stay (HOSPITAL_COMMUNITY): Payer: Medicare HMO

## 2019-11-14 ENCOUNTER — Encounter (HOSPITAL_COMMUNITY): Payer: Self-pay | Admitting: Hematology

## 2019-11-14 ENCOUNTER — Inpatient Hospital Stay (HOSPITAL_BASED_OUTPATIENT_CLINIC_OR_DEPARTMENT_OTHER): Payer: Medicare HMO | Admitting: Hematology

## 2019-11-14 ENCOUNTER — Other Ambulatory Visit: Payer: Self-pay

## 2019-11-14 VITALS — BP 143/73 | HR 94 | Temp 97.9°F | Resp 20 | Wt 268.8 lb

## 2019-11-14 DIAGNOSIS — C61 Malignant neoplasm of prostate: Secondary | ICD-10-CM

## 2019-11-14 DIAGNOSIS — Z5189 Encounter for other specified aftercare: Secondary | ICD-10-CM | POA: Diagnosis not present

## 2019-11-14 DIAGNOSIS — C7951 Secondary malignant neoplasm of bone: Secondary | ICD-10-CM

## 2019-11-14 DIAGNOSIS — Z95828 Presence of other vascular implants and grafts: Secondary | ICD-10-CM

## 2019-11-14 DIAGNOSIS — Z452 Encounter for adjustment and management of vascular access device: Secondary | ICD-10-CM | POA: Diagnosis not present

## 2019-11-14 LAB — COMPREHENSIVE METABOLIC PANEL
ALT: 17 U/L (ref 0–44)
AST: 21 U/L (ref 15–41)
Albumin: 3.8 g/dL (ref 3.5–5.0)
Alkaline Phosphatase: 87 U/L (ref 38–126)
Anion gap: 7 (ref 5–15)
BUN: 13 mg/dL (ref 8–23)
CO2: 25 mmol/L (ref 22–32)
Calcium: 8.7 mg/dL — ABNORMAL LOW (ref 8.9–10.3)
Chloride: 107 mmol/L (ref 98–111)
Creatinine, Ser: 1.44 mg/dL — ABNORMAL HIGH (ref 0.61–1.24)
GFR calc Af Amer: 53 mL/min — ABNORMAL LOW (ref >60)
GFR calc non Af Amer: 46 mL/min — ABNORMAL LOW (ref >60)
Glucose, Bld: 96 mg/dL (ref 70–99)
Potassium: 3.7 mmol/L (ref 3.5–5.1)
Sodium: 139 mmol/L (ref 135–145)
Total Bilirubin: 0.6 mg/dL (ref 0.3–1.2)
Total Protein: 6.7 g/dL (ref 6.5–8.1)

## 2019-11-14 LAB — CBC WITH DIFFERENTIAL/PLATELET
Abs Immature Granulocytes: 0.06 10*3/uL (ref 0.00–0.07)
Basophils Absolute: 0.1 10*3/uL (ref 0.0–0.1)
Basophils Relative: 1 %
Eosinophils Absolute: 0.3 10*3/uL (ref 0.0–0.5)
Eosinophils Relative: 4 %
HCT: 30.5 % — ABNORMAL LOW (ref 39.0–52.0)
Hemoglobin: 9.6 g/dL — ABNORMAL LOW (ref 13.0–17.0)
Immature Granulocytes: 1 %
Lymphocytes Relative: 16 %
Lymphs Abs: 1.5 10*3/uL (ref 0.7–4.0)
MCH: 29.3 pg (ref 26.0–34.0)
MCHC: 31.5 g/dL (ref 30.0–36.0)
MCV: 93 fL (ref 80.0–100.0)
Monocytes Absolute: 0.5 10*3/uL (ref 0.1–1.0)
Monocytes Relative: 6 %
Neutro Abs: 6.7 10*3/uL (ref 1.7–7.7)
Neutrophils Relative %: 72 %
Platelets: 160 10*3/uL (ref 150–400)
RBC: 3.28 MIL/uL — ABNORMAL LOW (ref 4.22–5.81)
RDW: 18.6 % — ABNORMAL HIGH (ref 11.5–15.5)
WBC: 9.2 10*3/uL (ref 4.0–10.5)
nRBC: 0 % (ref 0.0–0.2)

## 2019-11-14 LAB — LACTATE DEHYDROGENASE: LDH: 213 U/L — ABNORMAL HIGH (ref 98–192)

## 2019-11-14 LAB — MAGNESIUM: Magnesium: 2.3 mg/dL (ref 1.7–2.4)

## 2019-11-14 LAB — PSA: Prostatic Specific Antigen: 28.97 ng/mL — ABNORMAL HIGH (ref 0.00–4.00)

## 2019-11-14 MED ORDER — SODIUM CHLORIDE 0.9% FLUSH
10.0000 mL | INTRAVENOUS | Status: DC | PRN
  Administered 2019-11-14: 09:00:00 10 mL

## 2019-11-14 MED ORDER — ALTEPLASE 2 MG IJ SOLR
INTRAMUSCULAR | Status: AC
  Filled 2019-11-14: qty 2

## 2019-11-14 MED ORDER — HEPARIN SOD (PORK) LOCK FLUSH 100 UNIT/ML IV SOLN
500.0000 [IU] | Freq: Once | INTRAVENOUS | Status: AC | PRN
  Administered 2019-11-14: 500 [IU]

## 2019-11-14 MED ORDER — SODIUM CHLORIDE 0.9 % IV SOLN
Freq: Once | INTRAVENOUS | Status: AC
  Filled 2019-11-14: qty 4

## 2019-11-14 MED ORDER — ALTEPLASE 2 MG IJ SOLR
2.0000 mg | Freq: Once | INTRAMUSCULAR | Status: AC
  Administered 2019-11-14: 09:00:00 2 mg

## 2019-11-14 MED ORDER — DIPHENHYDRAMINE HCL 50 MG/ML IJ SOLN
25.0000 mg | Freq: Once | INTRAMUSCULAR | Status: AC
  Administered 2019-11-14: 25 mg via INTRAVENOUS
  Filled 2019-11-14: qty 1

## 2019-11-14 MED ORDER — STERILE WATER FOR INJECTION IJ SOLN
INTRAMUSCULAR | Status: AC
  Filled 2019-11-14: qty 10

## 2019-11-14 MED ORDER — FAMOTIDINE IN NACL 20-0.9 MG/50ML-% IV SOLN
20.0000 mg | Freq: Once | INTRAVENOUS | Status: AC
  Administered 2019-11-14: 11:00:00 20 mg via INTRAVENOUS
  Filled 2019-11-14: qty 50

## 2019-11-14 MED ORDER — SODIUM CHLORIDE 0.9 % IV SOLN
50.0000 mg/m2 | Freq: Once | INTRAVENOUS | Status: AC
  Administered 2019-11-14: 12:00:00 120 mg via INTRAVENOUS
  Filled 2019-11-14: qty 12

## 2019-11-14 MED ORDER — SODIUM CHLORIDE 0.9 % IV SOLN: Freq: Once | INTRAVENOUS | Status: AC

## 2019-11-14 NOTE — Progress Notes (Signed)
Patient has been assessed, vital signs and labs have been reviewed by Dr. Katragadda. ANC, Creatinine, LFTs, and Platelets are within treatment parameters per Dr. Katragadda. The patient is good to proceed with treatment at this time.  

## 2019-11-14 NOTE — Patient Instructions (Addendum)
[name] at [facility] Discharge Instructions  You were seen today by Dr. Delton Coombes. He went over your recent lab results. He will repeat a CT scan and Bone scan prior to your next visit. He will see you back in 3 weeks for labs, treatment and follow up.   Thank you for choosing [place] at [facility] to provide your oncology and hematology care.  To afford each patient quality time with our provider, please arrive at least 15 minutes before your scheduled appointment time.   If you have a lab appointment with the [place] [place] please come in thru the  Main Entrance and check in at the main information desk  You need to re-schedule your appointment should you arrive 10 or more minutes late.  We strive to give you quality time with our providers, and arriving late affects you and other patients whose appointments are after yours.  Also, if you no show three or more times for appointments you may be dismissed from the clinic at the providers discretion.     Again, thank you for choosing [facility].  Our hope is that these requests will decrease the amount of time that you wait before being seen by our physicians.       _____________________________________________________________  Should you have questions after your visit to [facility], please contact our office at [phone] between the hours of 8:00 a.m. and 4:30 p.m.  Voicemails left after 4:00 p.m. will not be returned until the following business day.  For prescription refill requests, have your pharmacy contact our office and allow 72 hours.    Cancer Center Support Programs:   > Cancer Support Group  2nd Tuesday of the month 1pm-2pm, Journey Room

## 2019-11-14 NOTE — Progress Notes (Signed)
Labs reviewed today with Dr. Delton Coombes . Ok to proceed with treatment today.    No blood return this morning when IV accessed. Alteplase protocol performed. See MAR for details.  1030-blood return present. Will proceed as planned.   Treatment given per orders. Patient tolerated it well without problems. Vitals stable and discharged home from clinic ambulatory. Follow up as scheduled.

## 2019-11-14 NOTE — Assessment & Plan Note (Addendum)
1.  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 -CT CAP on 06/30/2019 showed 3.4 x 5.2 cm left iliac lymphadenopathy.  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  No evidence of skeletal meta stasis.  Chronic compression fracture of T8. -Bone scan on 07/27/2019 showed uptake at T8 with no other uptake. -Germline mutation testing was negative.  Guardant 360 did not show any FDA approved therapies. -PSA prior to start of therapy was 16.71. -3 cycles of docetaxel (50 mg/M2) from 09/08/2019 through 10/24/2019 -He reported cramping in the hands after last treatment. -We reviewed his labs.  Electrolytes are grossly within normal limits.  He will proceed with cycle 4 of docetaxel today. -I plan to repeat CT abdomen and pelvis and bone scan prior to next visit.  We will also check a PSA level.  2.  Bone metastasis: -He will continue denosumab monthly.  He will continue calcium and vitamin D daily.  3.  CKD: -His baseline creatinine is between 1.5-2.  4.  Normocytic anemia: -Hemoglobin is 9.3 with normal MCV.  Denies any bleeding per rectum or melena.  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 -I will check his [id number], folic acid, ferritin and iron panel.

## 2019-11-14 NOTE — Patient Instructions (Signed)

## 2019-11-14 NOTE — Progress Notes (Signed)
[name], [place] [address]   CLINIC:  Medical Oncology/Hematology  PCP:  Celene Squibb, MD [place] [address] [phone]   REASON FOR VISIT:  Follow-up for metastatic prostate cancer    INTERVAL HISTORY:  [name] 79 y.o. 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  He is here for toxicity assessment prior to cycle 4.  Denies any tingling or numbness in extremities.  Reports cramping in the hands since last treatment.  Appetite is 50%.  Energy levels are 50%.  Shortness of breath on exertion is also stable.  REVIEW OF SYSTEMS:  Review of Systems  Respiratory: Positive for shortness of breath.   All other systems reviewed and are negative.    PAST MEDICAL/SURGICAL HISTORY:  Past Medical History:  Diagnosis Date  . Cancer [facility])    prostate  . Family history of breast cancer   . Family history of prostate cancer   . GERD (gastroesophageal reflux disease)   . HOH (hard of hearing)   . Hyperlipidemia   . Hypertension   . Port-A-Cath in place 08/29/2019  . Prostate cancer metastatic to bone ([place]) 04/04/2016  . Sleep apnea 10/24/2016   Past Surgical History:  Procedure Laterality Date  . CATARACT EXTRACTION W/PHACO Right 04/22/2019   Procedure: CATARACT EXTRACTION PHACO AND INTRAOCULAR LENS PLACEMENT RIGHT EYE;  Surgeon: Baruch Goldmann, MD;  Location: [facility];  Service: Ophthalmology;  Laterality: Right;  right  . CATARACT EXTRACTION W/PHACO Left 05/13/2019   Procedure: CATARACT EXTRACTION PHACO AND INTRAOCULAR LENS PLACEMENT LEFT EYE;  Surgeon: Baruch Goldmann, MD;  Location: [facility];  Service: Ophthalmology;  Laterality: Left;  left  . CHOLECYSTECTOMY    . CYSTOSCOPY W/ URETERAL STENT PLACEMENT Left 08/17/2019   Procedure: CYSTOSCOPY WITH RETROGRADE PYELOGRAM/URETERAL STENT PLACEMENT;  Surgeon: Cleon Gustin, MD;  Location: [facility];  Service: Urology;   Laterality: Left;  . HAND SURGERY    . INGUINAL HERNIA REPAIR Left   . PORTACATH PLACEMENT Left 09/07/2019   Procedure: INSERTION PORT-A-CATH;  Surgeon: Virl Cagey, MD;  Location: [facility];  Service: General;  Laterality: Left;  . PROSTATECTOMY    . PROSTATECTOMY    . radical prostatectomy    . TONSILLECTOMY       SOCIAL HISTORY:  Social History   Socioeconomic History  . Marital status: Widowed    Spouse name: Not on file  . Number of children: 3  . Years of education: 9  . Highest education level: Not on file  Occupational History  . Occupation: Animator- retired    Comment: [employer]  Tobacco Use  . Smoking status: Former Smoker    Packs/day: 1.00    Years: 32.00    Pack years: 32.00    Types: Cigarettes    Quit date: 02/04/1989    Years since quitting: 30.7  . Smokeless tobacco: Never Used  Substance and Sexual Activity  . Alcohol use: No  . Drug use: No  . Sexual activity: Not Currently  Other Topics Concern  . Not on file  Social History Narrative   Widowed   Loves with son   Chronic low back pain   Social Determinants of Health   Financial Resource Strain:   . Difficulty of Paying Living Expenses: Not on file  Food Insecurity:   . Worried About Charity fundraiser in the Last Year: Not on file  .  Ran Out of Food in the Last Year: Not on file  Transportation Needs:   . Lack of Transportation (Medical): Not on file  . Lack of Transportation (Non-Medical): Not on file  Physical Activity:   . Days of Exercise per Week: Not on file  . Minutes of Exercise per Session: Not on file  Stress:   . Feeling of Stress : Not on file  Social Connections:   . Frequency of Communication with Friends and Family: Not on file  . Frequency of Social Gatherings with Friends and Family: Not on file  . Attends Religious Services: Not on file  . Active Member of Clubs or Organizations: Not on file  . Attends Archivist Meetings: Not on file  .  Marital Status: Not on file  Intimate Partner Violence:   . Fear of Current or Ex-Partner: Not on file  . Emotionally Abused: Not on file  . Physically Abused: Not on file  . Sexually Abused: Not on file    FAMILY HISTORY:  Family History  Problem Relation Age of Onset  . Heart failure Mother        CHF  . Cancer Mother        unknown  . Prostate cancer Father   . Narcolepsy Sister   . Cancer Daughter        unknown  . Breast cancer Maternal Aunt   . Cancer Maternal Grandmother        unknown  . Cancer Maternal Aunt        unknown type of cancer    CURRENT MEDICATIONS:  Outpatient Encounter Medications as of 11/14/2019  Medication Sig  . benazepril (LOTENSIN) 20 MG tablet TAKE ONE TABLET BY MOUTH DAILY.  . calcium-vitamin D (OSCAL WITH D) 500-200 MG-UNIT TABS tablet Take 1 tablet by mouth daily.  . cetirizine (ZYRTEC) 10 MG tablet TAKE ONE TABLET BY MOUTH DAILY.  . DOCETAXEL IV Inject into the vein every 21 ( twenty-one) days.  . Ferrous Sulfate (IRON) 325 (65 Fe) MG TABS Take 1 tablet by mouth daily.  . fluticasone (FLONASE) 50 MCG/ACT nasal spray USE ONE SPRAY IN EACH NOSTRIL ONCE DAILY. SHAKE GENTLY BEFORE USING.  . furosemide (LASIX) 40 MG tablet TAKE ONE TABLET BY MOUTH DAILY. TAKE WITH POTASSIUM.  Marland Kitchen hydrochlorothiazide (HYDRODIURIL) 25 MG tablet TAKE ONE TABLET BY MOUTH DAILY.  Marland Kitchen ILEVRO 0.3 % ophthalmic suspension INSTILL ONE DROP IN THE OPERATIVE EYE DAILY. START TWO DAYS BEFORE SURGERY AND CONTINUE AS DIRECTED.  Marland Kitchen ketorolac (ACULAR) 0.5 % ophthalmic solution INSTILL ONE DROP IN THE OPERATIVE EYE TWICE DAILY.  Marland Kitchen losartan (COZAAR) 100 MG tablet Take 100 mg by mouth daily.  . meloxicam (MOBIC) 7.5 MG tablet Take 1 tablet (7.5 mg total) daily by mouth.  . methylphenidate (RITALIN) 20 MG tablet Take 20 mg by mouth daily.  . mirabegron ER (MYRBETRIQ) 25 MG TB24 tablet Take by mouth.  . moxifloxacin (VIGAMOX) 0.5 % ophthalmic solution INSTILL ONE DROP THREE TIMES DAILY IN  THE OPERATIVE EYE. START TWO DAYS BEFORE SURGERY AND CONTINUE AS DIRECTED.  Marland Kitchen potassium chloride (KLOR-CON) 20 MEQ packet Take by mouth.  . prednisoLONE acetate (PRED FORTE) 1 % ophthalmic suspension INSTILL ONE DROP IN THE OPERATIVE EYE THREE TIMES DAILY. START ONE DAY AFTER SURGERY AND CONTINUE AS DIRECTED.  Marland Kitchen clotrimazole-betamethasone (LOTRISONE) cream Apply 1 application topically 2 (two) times daily. (Patient not taking: Reported on 11/14/2019)  . ketoconazole (NIZORAL) 2 % cream APPLY A SMALL AMOUNT TO FACE  RASH TWICE DAILY FOR UP TO FOUR WEEKS IF NEEDED.  Marland Kitchen lidocaine-prilocaine (EMLA) cream Apply small amount to port a cath site and cover with plastic wrap 1 hour prior to chemotherapy appointments (Patient not taking: Reported on 10/31/2019)  . prochlorperazine (COMPAZINE) 10 MG tablet Take 1 tablet (10 mg total) by mouth every 6 (six) hours as needed for nausea or vomiting. (Patient not taking: Reported on 10/24/2019)  . selenium sulfide (SELSUN) 2.5 % shampoo Apply 1 application topically daily as needed for irritation. (Patient not taking: Reported on 10/24/2019)  . traMADol (ULTRAM) 50 MG tablet Take 1 tablet (50 mg total) by mouth every 6 (six) hours as needed. (Patient not taking: Reported on 10/24/2019)   No facility-administered encounter medications on file as of 11/14/2019.    ALLERGIES:  No Known Allergies   PHYSICAL EXAM:  ECOG Performance status: 1  Vitals:   11/14/19 0926  BP: (!) 143/73  Pulse: 94  Resp: 20  Temp: 97.9 F (36.6 C)  SpO2: 100%   Filed Weights   11/14/19 0926  Weight: 268 lb 12.8 oz (121.9 kg)    Physical Exam Vitals reviewed.  Constitutional:      Appearance: Normal appearance.  Cardiovascular:     Rate and Rhythm: Normal rate and regular rhythm.     Heart sounds: Normal heart sounds.  Pulmonary:     Effort: Pulmonary effort is normal.     Breath sounds: Normal breath sounds.  Abdominal:     General: There is no distension.      Palpations: Abdomen is soft. There is no mass.  Musculoskeletal:        General: Swelling present.  Skin:    General: Skin is warm.  Neurological:     General: No focal deficit present.     Mental Status: He is alert and oriented to person, place, and time.  Psychiatric:        Mood and Affect: Mood normal.        Behavior: Behavior normal.      LABORATORY DATA:  I have reviewed the labs as listed.  CBC    Component Value Date/Time   WBC 9.2 11/14/2019 0928   RBC 3.28 (L) 11/14/2019 0928   HGB 9.6 (L) 11/14/2019 0928   HCT 30.5 (L) 11/14/2019 0928   PLT 160 11/14/2019 0928   MCV 93.0 11/14/2019 0928   MCH 29.3 11/14/2019 0928   MCHC 31.5 11/14/2019 0928   RDW 18.6 (H) 11/14/2019 0928   LYMPHSABS 1.5 11/14/2019 0928   MONOABS 0.5 11/14/2019 0928   EOSABS 0.3 11/14/2019 0928   BASOSABS 0.1 11/14/2019 0928   CMP Latest Ref Rng & Units 11/14/2019 10/31/2019 10/24/2019  Glucose 70 - 99 mg/dL 96 111(H) 94  BUN 8 - 23 mg/dL 13 24(H) 18  Creatinine 0.61 - 1.24 mg/dL 1.44(H) 1.92(H) 1.67(H)  Sodium 135 - 145 mmol/L 139 135 137  Potassium 3.5 - 5.1 mmol/L 3.7 4.3 3.7  Chloride 98 - 111 mmol/L 107 103 102  CO2 22 - 32 mmol/L 25 21(L) 24  Calcium 8.9 - 10.3 mg/dL 8.7(L) 8.8(L) 8.8(L)  Total Protein 6.5 - 8.1 g/dL 6.7 7.5 6.9  Total Bilirubin 0.3 - 1.2 mg/dL 0.6 0.8 0.7  Alkaline Phos 38 - 126 U/L 87 79 86  AST 15 - 41 U/L 21 24 22   ALT 0 - 44 U/L 17 19 16        DIAGNOSTIC IMAGING:  I have independently reviewed the scans and discussed with  the patient.   I have reviewed Venita Lick LPN's note and agree with the documentation.  I personally performed a face-to-face visit, made revisions and my assessment and plan is as follows.    ASSESSMENT & PLAN:   Prostate cancer metastatic to bone ([place]) 1.  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 -CT CAP on 06/30/2019 showed 3.4 x  5.2 cm left iliac lymphadenopathy.  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  No evidence of skeletal meta stasis.  Chronic compression fracture of T8. -Bone scan on 07/27/2019 showed uptake at T8 with no other uptake. -Germline mutation testing was negative.  Guardant 360 did not show any FDA approved therapies. -PSA prior to start of therapy was 16.71. -3 cycles of docetaxel (50 mg/M2) from 09/08/2019 through 10/24/2019 -He reported cramping in the hands after last treatment. -We reviewed his labs.  Electrolytes are grossly within normal limits.  He will proceed with cycle 4 of docetaxel today. -I plan to repeat CT abdomen and pelvis and bone scan prior to next visit.  We will also check a PSA level.  2.  Bone metastasis: -He will continue denosumab monthly.  He will continue calcium and vitamin D daily.  3.  CKD: -His baseline creatinine is between 1.5-2.  4.  Normocytic anemia: -Hemoglobin is 9.3 with normal MCV.  Denies any bleeding per rectum or melena.  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 -I will check his 123456, folic acid, ferritin and iron panel.    Orders placed this encounter:  Orders Placed This Encounter  Procedures  . CT Abdomen Pelvis W Contrast  . NM Bone Scan Whole Body  . CBC with Differential/Platelet  . Comprehensive metabolic panel  . Magnesium  . CBC with Differential/Platelet  . Comprehensive metabolic panel   Total time spent is 30 minutes with more than 50% of the time spent face-to-face discussing treatment plan, counseling and coordination of care.   Derek Jack, MD [place] [phone]

## 2019-11-16 ENCOUNTER — Encounter (HOSPITAL_COMMUNITY): Payer: Self-pay

## 2019-11-16 ENCOUNTER — Other Ambulatory Visit: Payer: Self-pay

## 2019-11-16 ENCOUNTER — Inpatient Hospital Stay (HOSPITAL_COMMUNITY): Payer: Medicare HMO

## 2019-11-16 VITALS — BP 143/63 | HR 99 | Temp 97.1°F | Resp 18

## 2019-11-16 DIAGNOSIS — C61 Malignant neoplasm of prostate: Secondary | ICD-10-CM | POA: Diagnosis not present

## 2019-11-16 DIAGNOSIS — Z95828 Presence of other vascular implants and grafts: Secondary | ICD-10-CM

## 2019-11-16 DIAGNOSIS — Z5189 Encounter for other specified aftercare: Secondary | ICD-10-CM | POA: Diagnosis not present

## 2019-11-16 DIAGNOSIS — C7951 Secondary malignant neoplasm of bone: Secondary | ICD-10-CM | POA: Diagnosis not present

## 2019-11-16 DIAGNOSIS — Z452 Encounter for adjustment and management of vascular access device: Secondary | ICD-10-CM | POA: Diagnosis not present

## 2019-11-16 MED ORDER — PEGFILGRASTIM INJECTION 6 MG/0.6ML ~~LOC~~
6.0000 mg | PREFILLED_SYRINGE | Freq: Once | SUBCUTANEOUS | Status: AC
  Administered 2019-11-16: 6 mg via SUBCUTANEOUS

## 2019-11-16 MED ORDER — PEGFILGRASTIM INJECTION 6 MG/0.6ML ~~LOC~~
PREFILLED_SYRINGE | SUBCUTANEOUS | Status: AC
  Filled 2019-11-16: qty 0.6

## 2019-11-16 NOTE — Progress Notes (Signed)
[name] tolerated Neulasta injection well without complaints or incident. VSS Pt discharged self ambulatory in satisfactory condition

## 2019-11-16 NOTE — Patient Instructions (Signed)
[place] at [facility] Discharge Instructions  Received Neulasta injection today. Follow-up as scheduled. Call clinic for any questions or concerns   Thank you for choosing [place] at [facility] to provide your oncology and hematology care.  To afford each patient quality time with our provider, please arrive at least 15 minutes before your scheduled appointment time.   If you have a lab appointment with the [facility] please come in thru the Main Entrance and check in at the main information desk.  You need to re-schedule your appointment should you arrive 10 or more minutes late.  We strive to give you quality time with our providers, and arriving late affects you and other patients whose appointments are after yours.  Also, if you no show three or more times for appointments you may be dismissed from the clinic at the providers discretion.     Again, thank you for choosing [facility].  Our hope is that these requests will decrease the amount of time that you wait before being seen by our physicians.       _____________________________________________________________  Should you have questions after your visit to [facility], please contact our office at [phone] between the hours of 8:00 a.m. and 4:30 p.m.  Voicemails left after 4:00 p.m. will not be returned until the following business day.  For prescription refill requests, have your pharmacy contact our office and allow 72 hours.    Due to Covid, you will need to wear a mask upon entering the hospital. If you do not have a mask, a mask will be given to you at the Main Entrance upon arrival. For doctor visits, patients may have 1 support person with them. For treatment visits, patients can not have anyone with them due to social distancing guidelines and our immunocompromised population.

## 2019-11-21 IMAGING — DX DG ANKLE COMPLETE 3+V*R*
3 series · 3 of 3 positions shown · non-contrast
Comparison: None.

CLINICAL DATA: Right ankle pain after fall down steps today.

EXAM:
RIGHT ANKLE - COMPLETE 3+ VIEW

[ankle ap]
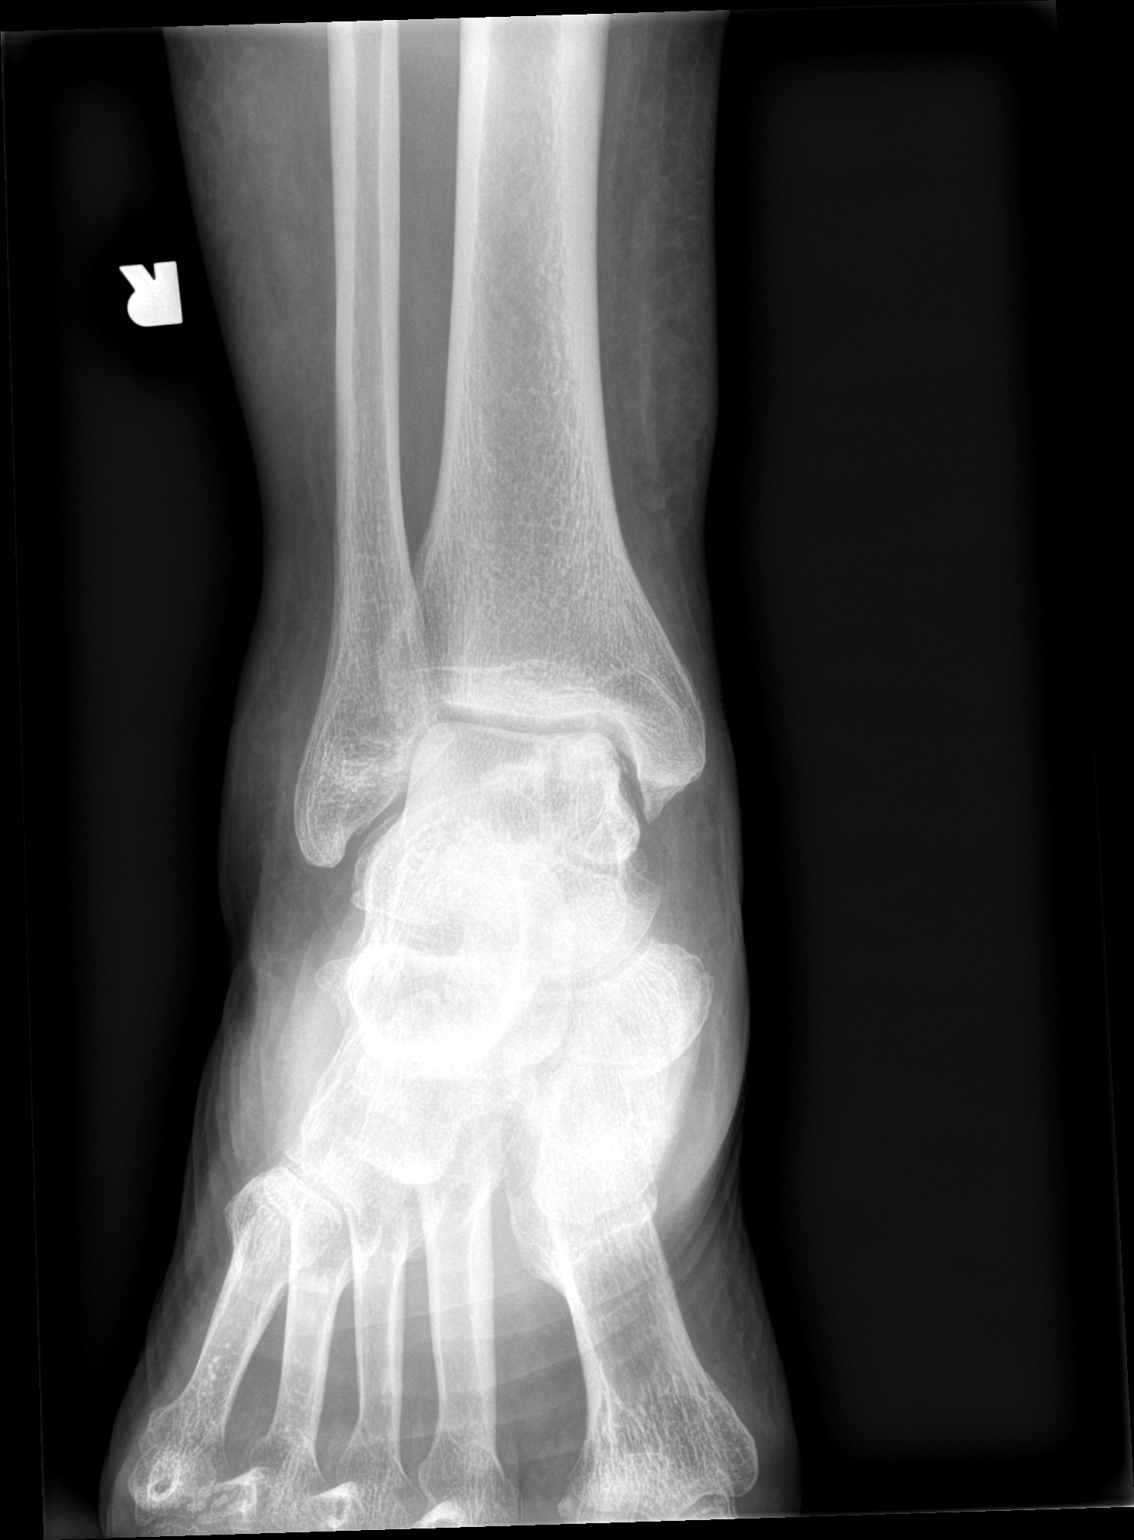

[ankle obl]
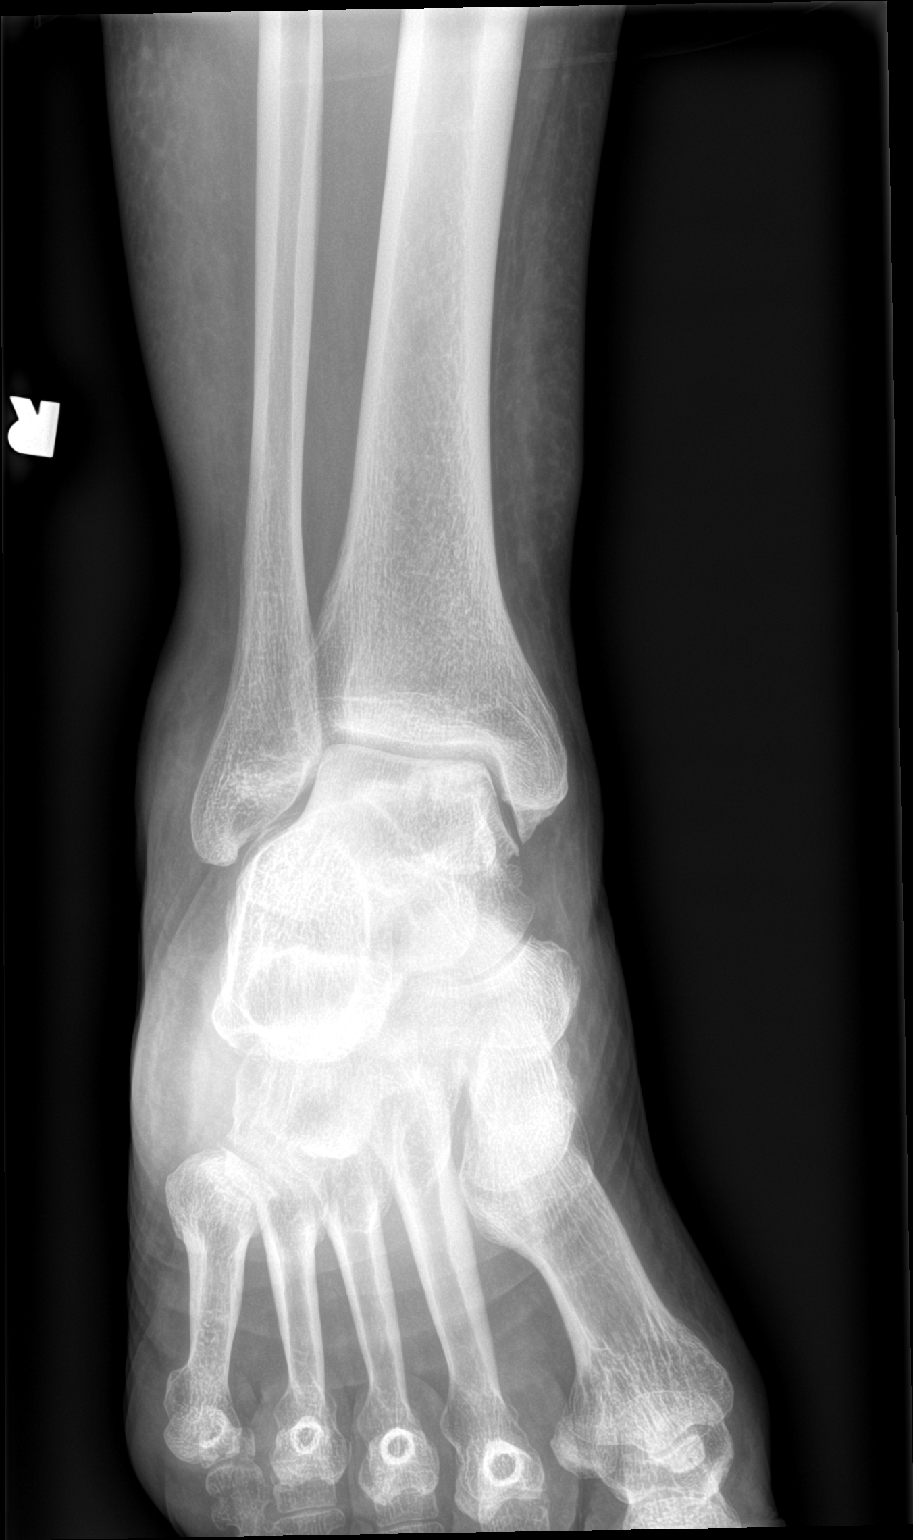

[ankle lat]
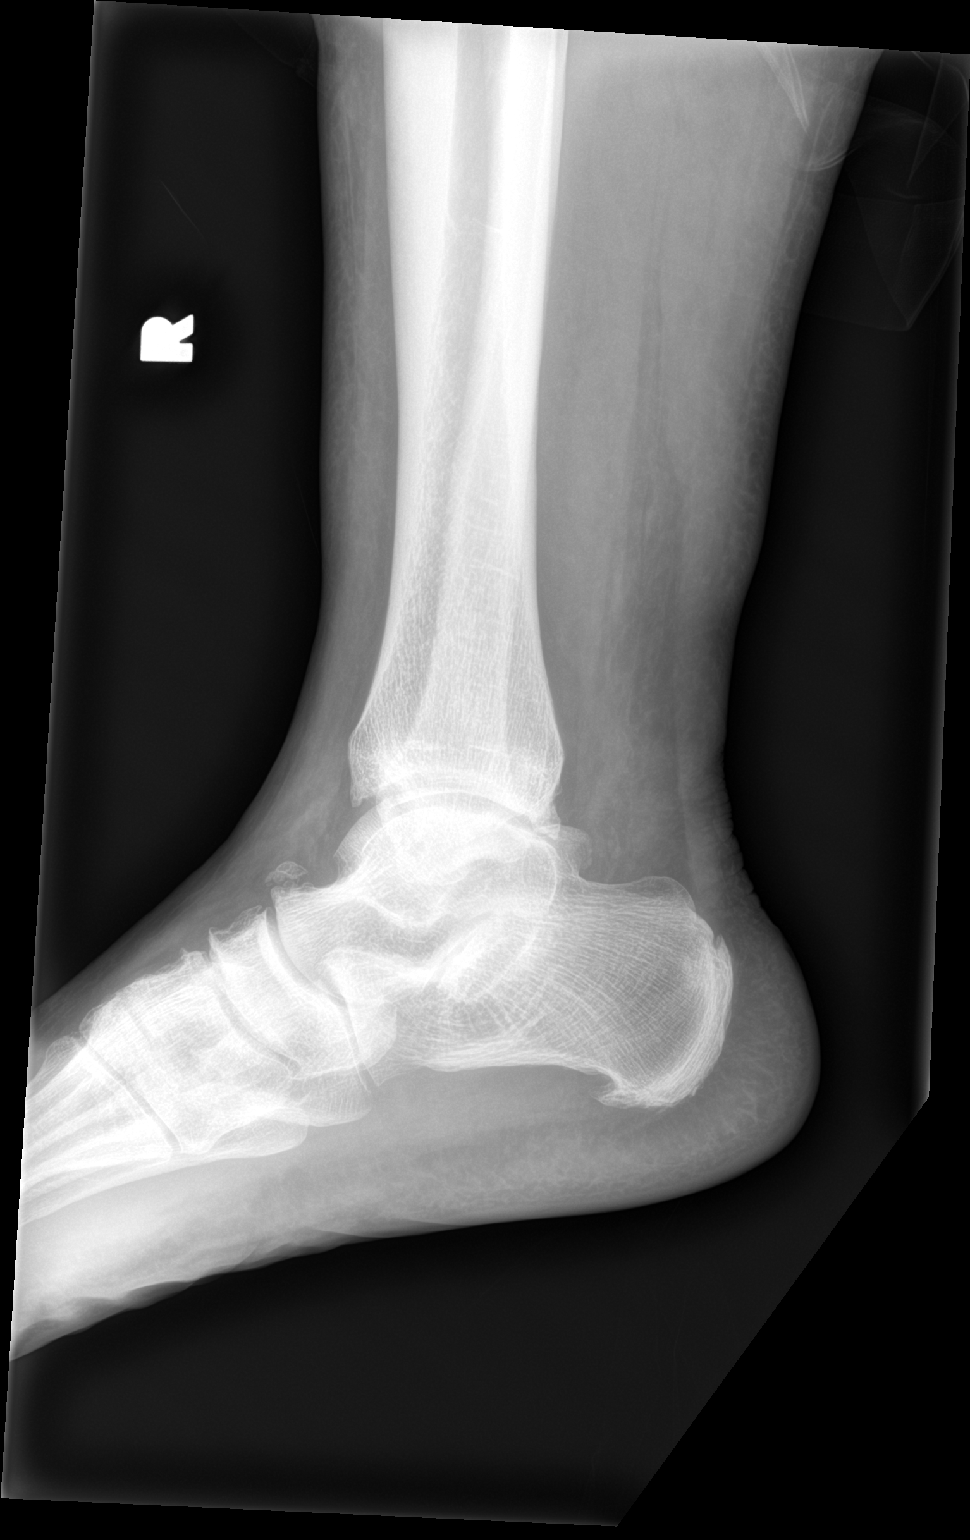

[3 of 3 positions shown; findings below may reference images not displayed]

FINDINGS: Small curvilinear osseous fragment adjacent to the anterior dorsal
talus suspicious for avulsion injury, possibly acute. There is
adjacent soft tissue edema. No other acute fracture. Subchondral
sclerosis involving the medial aspect of the talus is nonspecific
but may represent an osteochondral lesion. Generalized soft tissue
edema, slightly more focal over the anterior ankle. Plantar
calcaneal spur.
IMPRESSION: 1. Small curvilinear osseous fragment adjacent to the anterior
dorsal talus suspicious for avulsion injury, age indeterminate
possibly acute given adjacent soft tissue edema.
2. Subchondral sclerosis involving the medial aspect of the talar
dome is nonspecific but may represent an osteochondral lesion,
appears chronic.

## 2019-11-24 ENCOUNTER — Ambulatory Visit (HOSPITAL_COMMUNITY)
Admission: RE | Admit: 2019-11-24 | Discharge: 2019-11-24 | Disposition: A | Discharge status: Home / Self Care | Payer: Medicare HMO | Source: Ambulatory Visit | Attending: Hematology | Admitting: Hematology

## 2019-11-24 ENCOUNTER — Other Ambulatory Visit: Payer: Self-pay

## 2019-11-24 ENCOUNTER — Other Ambulatory Visit (HOSPITAL_COMMUNITY): Payer: Medicare HMO

## 2019-11-24 ENCOUNTER — Encounter (HOSPITAL_COMMUNITY): Payer: Self-pay

## 2019-11-24 DIAGNOSIS — C7951 Secondary malignant neoplasm of bone: Secondary | ICD-10-CM | POA: Diagnosis not present

## 2019-11-24 DIAGNOSIS — C61 Malignant neoplasm of prostate: Secondary | ICD-10-CM | POA: Insufficient documentation

## 2019-11-24 MED ORDER — TECHNETIUM TC 99M MEDRONATE IV KIT
20.0000 | PACK | Freq: Once | INTRAVENOUS | Status: AC | PRN
  Administered 2019-11-24: 18.3 via INTRAVENOUS

## 2019-12-01 ENCOUNTER — Other Ambulatory Visit (HOSPITAL_COMMUNITY): Payer: Medicare HMO

## 2019-12-01 ENCOUNTER — Ambulatory Visit (HOSPITAL_COMMUNITY)
Admission: RE | Admit: 2019-12-01 | Discharge: 2019-12-01 | Disposition: A | Discharge status: Home / Self Care | Payer: Medicare Other | Source: Ambulatory Visit | Attending: Hematology | Admitting: Hematology

## 2019-12-01 ENCOUNTER — Other Ambulatory Visit: Payer: Self-pay

## 2019-12-01 DIAGNOSIS — C7951 Secondary malignant neoplasm of bone: Secondary | ICD-10-CM | POA: Diagnosis not present

## 2019-12-01 DIAGNOSIS — C61 Malignant neoplasm of prostate: Secondary | ICD-10-CM | POA: Diagnosis present

## 2019-12-01 MED ORDER — IOHEXOL 9 MG/ML PO SOLN
ORAL | Status: AC
  Filled 2019-12-01: qty 1000

## 2019-12-01 MED ORDER — IOHEXOL 300 MG/ML  SOLN
100.0000 mL | Freq: Once | INTRAMUSCULAR | Status: AC | PRN
  Administered 2019-12-01: 100 mL via INTRAVENOUS

## 2019-12-05 ENCOUNTER — Inpatient Hospital Stay (HOSPITAL_COMMUNITY): Payer: Medicare Other

## 2019-12-05 ENCOUNTER — Other Ambulatory Visit: Payer: Self-pay

## 2019-12-05 ENCOUNTER — Inpatient Hospital Stay (HOSPITAL_COMMUNITY): Payer: Medicare Other | Attending: Hematology

## 2019-12-05 ENCOUNTER — Inpatient Hospital Stay (HOSPITAL_BASED_OUTPATIENT_CLINIC_OR_DEPARTMENT_OTHER): Payer: Medicare Other | Admitting: Hematology

## 2019-12-05 ENCOUNTER — Encounter (HOSPITAL_COMMUNITY): Payer: Self-pay | Admitting: Hematology

## 2019-12-05 VITALS — BP 146/70 | HR 81 | Temp 97.4°F | Resp 20

## 2019-12-05 DIAGNOSIS — Z5111 Encounter for antineoplastic chemotherapy: Secondary | ICD-10-CM | POA: Insufficient documentation

## 2019-12-05 DIAGNOSIS — C61 Malignant neoplasm of prostate: Secondary | ICD-10-CM | POA: Diagnosis not present

## 2019-12-05 DIAGNOSIS — C7951 Secondary malignant neoplasm of bone: Secondary | ICD-10-CM | POA: Diagnosis not present

## 2019-12-05 DIAGNOSIS — Z95828 Presence of other vascular implants and grafts: Secondary | ICD-10-CM

## 2019-12-05 DIAGNOSIS — Z5189 Encounter for other specified aftercare: Secondary | ICD-10-CM | POA: Diagnosis not present

## 2019-12-05 LAB — IRON AND TIBC
Iron: 69 ug/dL (ref 45–182)
Saturation Ratios: 21 % (ref 17.9–39.5)
TIBC: 322 ug/dL (ref 250–450)
UIBC: 253 ug/dL

## 2019-12-05 LAB — CBC WITH DIFFERENTIAL/PLATELET
Abs Immature Granulocytes: 0.01 10*3/uL (ref 0.00–0.07)
Basophils Absolute: 0.1 10*3/uL (ref 0.0–0.1)
Basophils Relative: 1 %
Eosinophils Absolute: 0.1 10*3/uL (ref 0.0–0.5)
Eosinophils Relative: 2 %
HCT: 29.7 % — ABNORMAL LOW (ref 39.0–52.0)
Hemoglobin: 9.4 g/dL — ABNORMAL LOW (ref 13.0–17.0)
Immature Granulocytes: 0 %
Lymphocytes Relative: 28 %
Lymphs Abs: 1.5 10*3/uL (ref 0.7–4.0)
MCH: 29.7 pg (ref 26.0–34.0)
MCHC: 31.6 g/dL (ref 30.0–36.0)
MCV: 93.7 fL (ref 80.0–100.0)
Monocytes Absolute: 0.4 10*3/uL (ref 0.1–1.0)
Monocytes Relative: 8 %
Neutro Abs: 3.3 10*3/uL (ref 1.7–7.7)
Neutrophils Relative %: 61 %
Platelets: 178 10*3/uL (ref 150–400)
RBC: 3.17 MIL/uL — ABNORMAL LOW (ref 4.22–5.81)
RDW: 18.6 % — ABNORMAL HIGH (ref 11.5–15.5)
WBC: 5.4 10*3/uL (ref 4.0–10.5)
nRBC: 0 % (ref 0.0–0.2)

## 2019-12-05 LAB — COMPREHENSIVE METABOLIC PANEL
ALT: 16 U/L (ref 0–44)
AST: 22 U/L (ref 15–41)
Albumin: 4 g/dL (ref 3.5–5.0)
Alkaline Phosphatase: 73 U/L (ref 38–126)
Anion gap: 9 (ref 5–15)
BUN: 23 mg/dL (ref 8–23)
CO2: 24 mmol/L (ref 22–32)
Calcium: 8.9 mg/dL (ref 8.9–10.3)
Chloride: 106 mmol/L (ref 98–111)
Creatinine, Ser: 1.68 mg/dL — ABNORMAL HIGH (ref 0.61–1.24)
GFR calc Af Amer: 44 mL/min — ABNORMAL LOW (ref >60)
GFR calc non Af Amer: 38 mL/min — ABNORMAL LOW (ref >60)
Glucose, Bld: 93 mg/dL (ref 70–99)
Potassium: 3.8 mmol/L (ref 3.5–5.1)
Sodium: 139 mmol/L (ref 135–145)
Total Bilirubin: 0.7 mg/dL (ref 0.3–1.2)
Total Protein: 6.8 g/dL (ref 6.5–8.1)

## 2019-12-05 LAB — FOLATE: Folate: 18.7 ng/mL (ref >5.9)

## 2019-12-05 LAB — FERRITIN: Ferritin: 246 ng/mL (ref 24–336)

## 2019-12-05 LAB — VITAMIN B12: Vitamin B-12: 3680 pg/mL — ABNORMAL HIGH (ref 180–914)

## 2019-12-05 MED ORDER — DIPHENHYDRAMINE HCL 50 MG/ML IJ SOLN
25.0000 mg | Freq: Once | INTRAMUSCULAR | Status: AC
  Administered 2019-12-05: 25 mg via INTRAVENOUS
  Filled 2019-12-05: qty 1

## 2019-12-05 MED ORDER — SODIUM CHLORIDE 0.9% FLUSH
10.0000 mL | INTRAVENOUS | Status: DC | PRN
  Administered 2019-12-05: 10 mL

## 2019-12-05 MED ORDER — SODIUM CHLORIDE 0.9 % IV SOLN
Freq: Once | INTRAVENOUS | Status: AC
  Filled 2019-12-05: qty 4

## 2019-12-05 MED ORDER — DENOSUMAB 120 MG/1.7ML ~~LOC~~ SOLN
120.0000 mg | Freq: Once | SUBCUTANEOUS | Status: AC
  Administered 2019-12-05: 120 mg via SUBCUTANEOUS

## 2019-12-05 MED ORDER — HEPARIN SOD (PORK) LOCK FLUSH 100 UNIT/ML IV SOLN
500.0000 [IU] | Freq: Once | INTRAVENOUS | Status: AC | PRN
  Administered 2019-12-05: 500 [IU]

## 2019-12-05 MED ORDER — SODIUM CHLORIDE 0.9 % IV SOLN
50.0000 mg/m2 | Freq: Once | INTRAVENOUS | Status: AC
  Administered 2019-12-05: 120 mg via INTRAVENOUS
  Filled 2019-12-05: qty 12

## 2019-12-05 MED ORDER — SODIUM CHLORIDE 0.9 % IV SOLN: Freq: Once | INTRAVENOUS | Status: AC

## 2019-12-05 MED ORDER — DENOSUMAB 120 MG/1.7ML ~~LOC~~ SOLN
SUBCUTANEOUS | Status: AC
  Filled 2019-12-05: qty 1.7

## 2019-12-05 MED ORDER — FAMOTIDINE IN NACL 20-0.9 MG/50ML-% IV SOLN
20.0000 mg | Freq: Once | INTRAVENOUS | Status: AC
  Administered 2019-12-05: 20 mg via INTRAVENOUS
  Filled 2019-12-05: qty 50

## 2019-12-05 NOTE — Patient Instructions (Addendum)
[facility] at [facility] Discharge Instructions  You were seen today by Dr. Katragadda. He went over your recent lab results. He will see you back in 3 weeks for labs, treatment and follow up.   Thank you for choosing [facility] at [facility] to provide your oncology and hematology care.  To afford each patient quality time with our provider, please arrive at least 15 minutes before your scheduled appointment time.   If you have a lab appointment with the Cancer Center please come in thru the  Main Entrance and check in at the main information desk  You need to re-schedule your appointment should you arrive 10 or more minutes late.  We strive to give you quality time with our providers, and arriving late affects you and other patients whose appointments are after yours.  Also, if you no show three or more times for appointments you may be dismissed from the clinic at the providers discretion.     Again, thank you for choosing [facility].  Our hope is that these requests will decrease the amount of time that you wait before being seen by our physicians.       _____________________________________________________________  Should you have questions after your visit to [facility], please contact our office at [phone] between the hours of 8:00 a.m. and 4:30 p.m.  Voicemails left after 4:00 p.m. will not be returned until the following business day.  For prescription refill requests, have your pharmacy contact our office and allow 72 hours.    Cancer Center Support Programs:   > Cancer Support Group  2nd Tuesday of the month 1pm-2pm, Journey Room    

## 2019-12-05 NOTE — Assessment & Plan Note (Addendum)
1.  Metastatic castration refractory prostate cancer to the lymph nodes and bones: -Enzalutamide from November 2019 through September 2020 with progression. -Germline mutation testing was negative.  Guardant 360 did not show any targetable mutations. -PSA on 08/11/2019 was 16.71. -4 cycles of docetaxel from 09/08/2019 through 11/14/2019 -PSA on 11/04/2019 was 28.97.  I have reviewed bone scan dated 11/24/2019 which showed uptake at chronic compression fracture of mid thoracic spine with no new lesions. -We have reviewed images of CT abdomen and pelvis shows mild enlargement of the left iliac adenopathy measuring 6.4 x 4.0 cm, previously 5.6 x 3.7 cm.  This scan was compared to CT scan from September 2020.  Did not start chemotherapy until mid November. -I have reviewed his CBC and LFTs.  He will proceed with cycle 5 today without any dose changes. -I will reevaluate him in 3 weeks for follow-up and toxicity assessment.  2.  Bone metastasis: -He will continue denosumab monthly.  We will continue calcium and vitamin D.  3.  CKD: -Baseline creatinine is between 1.5 -2.  4.  Normocytic anemia: -Hemoglobin today is 9.4.  MCV is normal.  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 -We will check ferritin, iron panel, 123456 and folic acid.

## 2019-12-05 NOTE — Patient Instructions (Signed)
[facility] Discharge Instructions for Patients Receiving Chemotherapy  Today you received the following chemotherapy agents   To help prevent nausea and vomiting after your treatment, we encourage you to take your nausea medication    If you develop nausea and vomiting that is not controlled by your nausea medication, call the clinic.   BELOW ARE SYMPTOMS THAT SHOULD BE REPORTED IMMEDIATELY:  *FEVER GREATER THAN 100.5 F  *CHILLS WITH OR WITHOUT FEVER  NAUSEA AND VOMITING THAT IS NOT CONTROLLED WITH YOUR NAUSEA MEDICATION  *UNUSUAL SHORTNESS OF BREATH  *UNUSUAL BRUISING OR BLEEDING  TENDERNESS IN MOUTH AND THROAT WITH OR WITHOUT PRESENCE OF ULCERS  *URINARY PROBLEMS  *BOWEL PROBLEMS  UNUSUAL RASH Items with * indicate a potential emergency and should be followed up as soon as possible.  Feel free to call the clinic should you have any questions or concerns. The clinic phone number is [phone].  Please show the CHEMO ALERT CARD at check-in to the Emergency Department and triage nurse.  [facility] Discharge Instructions for Patients Receiving Chemotherapy  Today you received the following chemotherapy agents   To help prevent nausea and vomiting after your treatment, we encourage you to take your nausea medication    If you develop nausea and vomiting that is not controlled by your nausea medication, call the clinic.   BELOW ARE SYMPTOMS THAT SHOULD BE REPORTED IMMEDIATELY: *FEVER GREATER THAN 100.5 F *CHILLS WITH OR WITHOUT FEVER NAUSEA AND VOMITING THAT IS NOT CONTROLLED WITH YOUR NAUSEA MEDICATION *UNUSUAL SHORTNESS OF BREATH *UNUSUAL BRUISING OR BLEEDING TENDERNESS IN MOUTH AND THROAT WITH OR WITHOUT PRESENCE OF ULCERS *URINARY PROBLEMS *BOWEL PROBLEMS UNUSUAL RASH Items with * indicate a potential emergency and should be followed up as soon as possible.  Feel free to call the clinic should you have any questions or concerns.  The clinic phone number is [phone].  Please show the CHEMO ALERT CARD at check-in to the Emergency Department and triage nurse.   

## 2019-12-05 NOTE — Progress Notes (Signed)
Patient presents today for treatment and follow visit with Dr. Delton Coombes. Labs drawn from port and send to lab . Pending. Vital signs within parameters for treatment today. Patient denies any changes since his last visit. Patient denies any jaw pain or dental work in the future. Patient states he is taking his Oscal with Vitamin D daily.   Labs added on by [facility] LPN. Creatinine today 1.68. Reported Creatinine to [facility] LPN via instant message.   Labs reviewed with Dr. Delton Coombes. Per MD proceed with treatment. No new orders received.   Treatment given today per MD orders. Tolerated infusion without adverse affects. Vital signs stable. No complaints at this time. Discharged from clinic ambulatory. F/U with [facility] as scheduled.

## 2019-12-05 NOTE — Progress Notes (Signed)
[place], [place] [address]   CLINIC:  Medical Oncology/Hematology  PCP:  Celene Squibb, MD [address] [place] [address] [phone]   REASON FOR VISIT:  Follow-up for metastatic prostate cancer    INTERVAL HISTORY:  [name] 79 y.o. 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  He had CT scan of the abdomen and pelvis and bone scan done recently.  Denies any tingling or numbness in the extremities.  However reports right leg giving out sometimes.  Some weakness in the legs reported.  Appetite and energy levels 100%.  He denies any falls.  He is taking tramadol as needed for his leg pain.  REVIEW OF SYSTEMS:  Review of Systems  All other systems reviewed and are negative.    PAST MEDICAL/SURGICAL HISTORY:  Past Medical History:  Diagnosis Date  . Cancer [facility])    prostate  . Family history of breast cancer   . Family history of prostate cancer   . GERD (gastroesophageal reflux disease)   . HOH (hard of hearing)   . Hyperlipidemia   . Hypertension   . Port-A-Cath in place 08/29/2019  . Prostate cancer metastatic to bone ([name]) 04/04/2016  . Sleep apnea 10/24/2016   Past Surgical History:  Procedure Laterality Date  . CATARACT EXTRACTION W/PHACO Right 04/22/2019   Procedure: CATARACT EXTRACTION PHACO AND INTRAOCULAR LENS PLACEMENT RIGHT EYE;  Surgeon: [name] Goldmann, MD;  Location: [facility];  Service: Ophthalmology;  Laterality: Right;  right  . CATARACT EXTRACTION W/PHACO Left 05/13/2019   Procedure: CATARACT EXTRACTION PHACO AND INTRAOCULAR LENS PLACEMENT LEFT EYE;  Surgeon: [name] Goldmann, MD;  Location: [facility];  Service: Ophthalmology;  Laterality: Left;  left  . CHOLECYSTECTOMY    . CYSTOSCOPY W/ URETERAL STENT PLACEMENT Left 08/17/2019   Procedure: CYSTOSCOPY WITH RETROGRADE PYELOGRAM/URETERAL STENT PLACEMENT;   Surgeon: Cleon Gustin, MD;  Location: [facility];  Service: Urology;  Laterality: Left;  . HAND SURGERY    . INGUINAL HERNIA REPAIR Left   . PORTACATH PLACEMENT Left 09/07/2019   Procedure: INSERTION PORT-A-CATH;  Surgeon: Virl Cagey, MD;  Location: [facility];  Service: General;  Laterality: Left;  . PROSTATECTOMY    . PROSTATECTOMY    . radical prostatectomy    . TONSILLECTOMY       SOCIAL HISTORY:  Social History   Socioeconomic History  . Marital status: Widowed    Spouse name: Not on file  . Number of children: 3  . Years of education: 9  . Highest education level: Not on file  Occupational History  . Occupation: Animator- retired    Comment: [employer]  Tobacco Use  . Smoking status: Former Smoker    Packs/day: 1.00    Years: 32.00    Pack years: 32.00    Types: Cigarettes    Quit date: 02/04/1989    Years since quitting: 30.8  . Smokeless tobacco: Never Used  Substance and Sexual Activity  . Alcohol use: No  . Drug use: No  . Sexual activity: Not Currently  Other Topics Concern  . Not on file  Social History Narrative   Widowed   Loves with son   Chronic low back pain   Social Determinants of Health   Financial Resource Strain:   . Difficulty of Paying Living Expenses: Not on file  Food Insecurity:   .  Worried About Charity fundraiser in the Last Year: Not on file  . Ran Out of Food in the Last Year: Not on file  Transportation Needs:   . Lack of Transportation (Medical): Not on file  . Lack of Transportation (Non-Medical): Not on file  Physical Activity:   . Days of Exercise per Week: Not on file  . Minutes of Exercise per Session: Not on file  Stress:   . Feeling of Stress : Not on file  Social Connections:   . Frequency of Communication with Friends and Family: Not on file  . Frequency of Social Gatherings with Friends and Family: Not on file  . Attends Religious Services: Not on file  . Active Member of Clubs or Organizations:  Not on file  . Attends Archivist Meetings: Not on file  . Marital Status: Not on file  Intimate Partner Violence:   . Fear of Current or Ex-Partner: Not on file  . Emotionally Abused: Not on file  . Physically Abused: Not on file  . Sexually Abused: Not on file    FAMILY HISTORY:  Family History  Problem Relation Age of Onset  . Heart failure Mother        CHF  . Cancer Mother        unknown  . Prostate cancer Father   . Narcolepsy Sister   . Cancer Daughter        unknown  . Breast cancer Maternal Aunt   . Cancer Maternal Grandmother        unknown  . Cancer Maternal Aunt        unknown type of cancer    CURRENT MEDICATIONS:  Outpatient Encounter Medications as of 12/05/2019  Medication Sig  . benazepril (LOTENSIN) 20 MG tablet TAKE ONE TABLET BY MOUTH DAILY.  . calcium-vitamin D (OSCAL WITH D) 500-200 MG-UNIT TABS tablet Take 1 tablet by mouth daily.  . cetirizine (ZYRTEC) 10 MG tablet TAKE ONE TABLET BY MOUTH DAILY.  . DOCETAXEL IV Inject into the vein every 21 ( twenty-one) days.  . Ferrous Sulfate (IRON) 325 (65 Fe) MG TABS Take 1 tablet by mouth daily.  . fluticasone (FLONASE) 50 MCG/ACT nasal spray USE ONE SPRAY IN EACH NOSTRIL ONCE DAILY. SHAKE GENTLY BEFORE USING.  . furosemide (LASIX) 40 MG tablet TAKE ONE TABLET BY MOUTH DAILY. TAKE WITH POTASSIUM.  Marland Kitchen hydrochlorothiazide (HYDRODIURIL) 25 MG tablet TAKE ONE TABLET BY MOUTH DAILY.  Marland Kitchen ILEVRO 0.3 % ophthalmic suspension INSTILL ONE DROP IN THE OPERATIVE EYE DAILY. START TWO DAYS BEFORE SURGERY AND CONTINUE AS DIRECTED.  Marland Kitchen ketoconazole (NIZORAL) 2 % cream APPLY A SMALL AMOUNT TO FACE RASH TWICE DAILY FOR UP TO FOUR WEEKS IF NEEDED.  Marland Kitchen ketorolac (ACULAR) 0.5 % ophthalmic solution INSTILL ONE DROP IN THE OPERATIVE EYE TWICE DAILY.  Marland Kitchen losartan (COZAAR) 100 MG tablet Take 100 mg by mouth daily.  . meloxicam (MOBIC) 7.5 MG tablet Take 1 tablet (7.5 mg total) daily by mouth.  . methylphenidate (RITALIN) 20 MG  tablet Take 20 mg by mouth daily.  . mirabegron ER (MYRBETRIQ) 25 MG TB24 tablet Take by mouth.  . moxifloxacin (VIGAMOX) 0.5 % ophthalmic solution INSTILL ONE DROP THREE TIMES DAILY IN THE OPERATIVE EYE. START TWO DAYS BEFORE SURGERY AND CONTINUE AS DIRECTED.  Marland Kitchen potassium chloride (KLOR-CON) 20 MEQ packet Take by mouth.  . prednisoLONE acetate (PRED FORTE) 1 % ophthalmic suspension INSTILL ONE DROP IN THE OPERATIVE EYE THREE TIMES DAILY. START ONE DAY  AFTER SURGERY AND CONTINUE AS DIRECTED.  Marland Kitchen clotrimazole-betamethasone (LOTRISONE) cream Apply 1 application topically 2 (two) times daily. (Patient not taking: Reported on 11/14/2019)  . lidocaine-prilocaine (EMLA) cream Apply small amount to port a cath site and cover with plastic wrap 1 hour prior to chemotherapy appointments (Patient not taking: Reported on 10/31/2019)  . prochlorperazine (COMPAZINE) 10 MG tablet Take 1 tablet (10 mg total) by mouth every 6 (six) hours as needed for nausea or vomiting. (Patient not taking: Reported on 12/05/2019)  . selenium sulfide (SELSUN) 2.5 % shampoo Apply 1 application topically daily as needed for irritation. (Patient not taking: Reported on 10/24/2019)  . traMADol (ULTRAM) 50 MG tablet Take 1 tablet (50 mg total) by mouth every 6 (six) hours as needed. (Patient not taking: Reported on 10/24/2019)   No facility-administered encounter medications on file as of 12/05/2019.    ALLERGIES:  No Known Allergies   PHYSICAL EXAM:  ECOG Performance status: 1  Vitals:   12/05/19 0946  BP: 140/68  Pulse: 91  Resp: 19  Temp: (!) 97.1 F (36.2 C)  SpO2: 100%   Filed Weights   12/05/19 0946  Weight: 267 lb 11.2 oz (121.4 kg)    Physical Exam Vitals reviewed.  Constitutional:      Appearance: Normal appearance.  Cardiovascular:     Rate and Rhythm: Normal rate and regular rhythm.     Heart sounds: Normal heart sounds.  Pulmonary:     Effort: Pulmonary effort is normal.     Breath sounds: Normal breath  sounds.  Abdominal:     General: There is no distension.     Palpations: Abdomen is soft. There is no mass.  Musculoskeletal:        General: Swelling present.  Skin:    General: Skin is warm.  Neurological:     General: No focal deficit present.     Mental Status: He is alert and oriented to person, place, and time.  Psychiatric:        Mood and Affect: Mood normal.        Behavior: Behavior normal.      LABORATORY DATA:  I have reviewed the labs as listed.  CBC    Component Value Date/Time   WBC 5.4 12/05/2019 1003   RBC 3.17 (L) 12/05/2019 1003   HGB 9.4 (L) 12/05/2019 1003   HCT 29.7 (L) 12/05/2019 1003   PLT 178 12/05/2019 1003   MCV 93.7 12/05/2019 1003   MCH 29.7 12/05/2019 1003   MCHC 31.6 12/05/2019 1003   RDW 18.6 (H) 12/05/2019 1003   LYMPHSABS 1.5 12/05/2019 1003   MONOABS 0.4 12/05/2019 1003   EOSABS 0.1 12/05/2019 1003   BASOSABS 0.1 12/05/2019 1003   CMP Latest Ref Rng & Units 12/05/2019 11/14/2019 10/31/2019  Glucose 70 - 99 mg/dL 93 96 111(H)  BUN 8 - 23 mg/dL 23 13 24(H)  Creatinine 0.61 - 1.24 mg/dL 1.68(H) 1.44(H) 1.92(H)  Sodium 135 - 145 mmol/L 139 139 135  Potassium 3.5 - 5.1 mmol/L 3.8 3.7 4.3  Chloride 98 - 111 mmol/L 106 107 103  CO2 22 - 32 mmol/L 24 25 21(L)  Calcium 8.9 - 10.3 mg/dL 8.9 8.7(L) 8.8(L)  Total Protein 6.5 - 8.1 g/dL 6.8 6.7 7.5  Total Bilirubin 0.3 - 1.2 mg/dL 0.7 0.6 0.8  Alkaline Phos 38 - 126 U/L 73 87 79  AST 15 - 41 U/L 22 21 24   ALT 0 - 44 U/L 16 17 19  DIAGNOSTIC IMAGING:  I have independently reviewed the scans and discussed with the patient.   I have reviewed Venita Lick LPN's note and agree with the documentation.  I personally performed a face-to-face visit, made revisions and my assessment and plan is as follows.    ASSESSMENT & PLAN:   Prostate cancer metastatic to bone ([place]) 1.  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 -Germline mutation testing was negative.  Guardant 360 did not show any targetable mutations. -PSA on 08/11/2019 was 16.71. -4 cycles of docetaxel from 09/08/2019 through 11/14/2019 -PSA on 11/04/2019 was 28.97.  I have reviewed bone scan dated 11/24/2019 which showed uptake at chronic compression fracture of mid thoracic spine with no new lesions. -We have reviewed images of CT abdomen and pelvis shows mild enlargement of the left iliac adenopathy measuring 6.4 x 4.0 cm, previously 5.6 x 3.7 cm.  This scan was compared to CT scan from September 2020.  Did not start chemotherapy until mid November. -I have reviewed his CBC and LFTs.  He will proceed with cycle 5 today without any dose changes. -I will reevaluate him in 3 weeks for follow-up and toxicity assessment.  2.  Bone metastasis: -He will continue denosumab monthly.  We will continue calcium and vitamin D.  3.  CKD: -Baseline creatinine is between 1.5 -2.  4.  Normocytic anemia: -Hemoglobin today is 9.4.  MCV is normal.  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 -We will check ferritin, iron panel, 123456 and folic acid.    Orders placed this encounter:  Orders Placed This Encounter  Procedures  . Iron and TIBC  . Ferritin  . Vitamin B12  . Folate     Derek Jack, MD [place] [phone]

## 2019-12-07 ENCOUNTER — Inpatient Hospital Stay (HOSPITAL_COMMUNITY): Payer: Medicare Other

## 2019-12-07 ENCOUNTER — Other Ambulatory Visit: Payer: Self-pay

## 2019-12-07 VITALS — BP 133/72 | HR 95 | Temp 97.1°F | Resp 18

## 2019-12-07 DIAGNOSIS — Z5111 Encounter for antineoplastic chemotherapy: Secondary | ICD-10-CM | POA: Diagnosis not present

## 2019-12-07 DIAGNOSIS — C61 Malignant neoplasm of prostate: Secondary | ICD-10-CM

## 2019-12-07 DIAGNOSIS — Z5189 Encounter for other specified aftercare: Secondary | ICD-10-CM | POA: Diagnosis not present

## 2019-12-07 DIAGNOSIS — C7951 Secondary malignant neoplasm of bone: Secondary | ICD-10-CM | POA: Diagnosis not present

## 2019-12-07 DIAGNOSIS — Z95828 Presence of other vascular implants and grafts: Secondary | ICD-10-CM

## 2019-12-07 MED ORDER — PEGFILGRASTIM INJECTION 6 MG/0.6ML ~~LOC~~
6.0000 mg | PREFILLED_SYRINGE | Freq: Once | SUBCUTANEOUS | Status: AC
  Administered 2019-12-07: 6 mg via SUBCUTANEOUS

## 2019-12-07 MED ORDER — PEGFILGRASTIM INJECTION 6 MG/0.6ML ~~LOC~~
PREFILLED_SYRINGE | SUBCUTANEOUS | Status: AC
  Filled 2019-12-07: qty 0.6

## 2019-12-07 NOTE — Patient Instructions (Signed)
[place] at [facility]  Discharge Instructions:  Neulasta injection received today. _______________________________________________________________  Thank you for choosing [place] at [facility] to provide your oncology and hematology care.  To afford each patient quality time with our providers, please arrive at least 15 minutes before your scheduled appointment.  You need to re-schedule your appointment if you arrive 10 or more minutes late.  We strive to give you quality time with our providers, and arriving late affects you and other patients whose appointments are after yours.  Also, if you no show three or more times for appointments you may be dismissed from the clinic.  Again, thank you for choosing [place] at [place] hope is that these requests will allow you access to exceptional care and in a timely manner. _______________________________________________________________  If you have questions after your visit, please contact our office at [phone] between the hours of 8:30 a.m. and 5:00 p.m. Voicemails left after 4:30 p.m. will not be returned until the following business day. _______________________________________________________________  For prescription refill requests, have your pharmacy contact our office. _______________________________________________________________  Recommendations made by the consultant and any test results will be sent to your referring physician. _______________________________________________________________

## 2019-12-07 NOTE — Progress Notes (Signed)
[name] presents today for Neulasta injection. VSS. Injection tolerated without incident or complaint. See MAR for details. Patient discharged in satisfactory condition with follow up instructions.

## 2019-12-26 ENCOUNTER — Inpatient Hospital Stay (HOSPITAL_BASED_OUTPATIENT_CLINIC_OR_DEPARTMENT_OTHER): Payer: Medicare Other | Admitting: Hematology

## 2019-12-26 ENCOUNTER — Other Ambulatory Visit: Payer: Self-pay

## 2019-12-26 ENCOUNTER — Inpatient Hospital Stay (HOSPITAL_COMMUNITY): Payer: Medicare Other

## 2019-12-26 ENCOUNTER — Inpatient Hospital Stay (HOSPITAL_COMMUNITY): Payer: Medicare Other | Attending: Hematology

## 2019-12-26 ENCOUNTER — Encounter (HOSPITAL_COMMUNITY): Payer: Self-pay | Admitting: Hematology

## 2019-12-26 VITALS — BP 150/84 | HR 80 | Temp 97.5°F | Resp 18

## 2019-12-26 VITALS — BP 136/63 | HR 95 | Temp 97.9°F | Resp 18 | Wt 267.2 lb

## 2019-12-26 DIAGNOSIS — D701 Agranulocytosis secondary to cancer chemotherapy: Secondary | ICD-10-CM | POA: Insufficient documentation

## 2019-12-26 DIAGNOSIS — C7951 Secondary malignant neoplasm of bone: Secondary | ICD-10-CM

## 2019-12-26 DIAGNOSIS — Z95828 Presence of other vascular implants and grafts: Secondary | ICD-10-CM

## 2019-12-26 DIAGNOSIS — E611 Iron deficiency: Secondary | ICD-10-CM | POA: Insufficient documentation

## 2019-12-26 DIAGNOSIS — Z5189 Encounter for other specified aftercare: Secondary | ICD-10-CM | POA: Diagnosis not present

## 2019-12-26 DIAGNOSIS — C61 Malignant neoplasm of prostate: Secondary | ICD-10-CM

## 2019-12-26 DIAGNOSIS — Z5111 Encounter for antineoplastic chemotherapy: Secondary | ICD-10-CM | POA: Diagnosis not present

## 2019-12-26 LAB — CBC WITH DIFFERENTIAL/PLATELET
Abs Immature Granulocytes: 0.03 10*3/uL (ref 0.00–0.07)
Basophils Absolute: 0 10*3/uL (ref 0.0–0.1)
Basophils Relative: 1 %
Eosinophils Absolute: 0.2 10*3/uL (ref 0.0–0.5)
Eosinophils Relative: 3 %
HCT: 30 % — ABNORMAL LOW (ref 39.0–52.0)
Hemoglobin: 9.5 g/dL — ABNORMAL LOW (ref 13.0–17.0)
Immature Granulocytes: 0 %
Lymphocytes Relative: 20 %
Lymphs Abs: 1.5 10*3/uL (ref 0.7–4.0)
MCH: 29.9 pg (ref 26.0–34.0)
MCHC: 31.7 g/dL (ref 30.0–36.0)
MCV: 94.3 fL (ref 80.0–100.0)
Monocytes Absolute: 0.5 10*3/uL (ref 0.1–1.0)
Monocytes Relative: 7 %
Neutro Abs: 4.9 10*3/uL (ref 1.7–7.7)
Neutrophils Relative %: 69 %
Platelets: 213 10*3/uL (ref 150–400)
RBC: 3.18 MIL/uL — ABNORMAL LOW (ref 4.22–5.81)
RDW: 17.3 % — ABNORMAL HIGH (ref 11.5–15.5)
WBC: 7.2 10*3/uL (ref 4.0–10.5)
nRBC: 0 % (ref 0.0–0.2)

## 2019-12-26 LAB — MAGNESIUM: Magnesium: 2 mg/dL (ref 1.7–2.4)

## 2019-12-26 LAB — COMPREHENSIVE METABOLIC PANEL
ALT: 15 U/L (ref 0–44)
AST: 24 U/L (ref 15–41)
Albumin: 3.8 g/dL (ref 3.5–5.0)
Alkaline Phosphatase: 75 U/L (ref 38–126)
Anion gap: 8 (ref 5–15)
BUN: 17 mg/dL (ref 8–23)
CO2: 25 mmol/L (ref 22–32)
Calcium: 8.8 mg/dL — ABNORMAL LOW (ref 8.9–10.3)
Chloride: 104 mmol/L (ref 98–111)
Creatinine, Ser: 1.5 mg/dL — ABNORMAL HIGH (ref 0.61–1.24)
GFR calc Af Amer: 50 mL/min — ABNORMAL LOW (ref >60)
GFR calc non Af Amer: 43 mL/min — ABNORMAL LOW (ref >60)
Glucose, Bld: 82 mg/dL (ref 70–99)
Potassium: 3.9 mmol/L (ref 3.5–5.1)
Sodium: 137 mmol/L (ref 135–145)
Total Bilirubin: 0.7 mg/dL (ref 0.3–1.2)
Total Protein: 6.9 g/dL (ref 6.5–8.1)

## 2019-12-26 MED ORDER — DIPHENHYDRAMINE HCL 50 MG/ML IJ SOLN
25.0000 mg | Freq: Once | INTRAMUSCULAR | Status: AC
  Administered 2019-12-26: 25 mg via INTRAVENOUS
  Filled 2019-12-26: qty 1

## 2019-12-26 MED ORDER — HEPARIN SOD (PORK) LOCK FLUSH 100 UNIT/ML IV SOLN
500.0000 [IU] | Freq: Once | INTRAVENOUS | Status: AC | PRN
  Administered 2019-12-26: 500 [IU]

## 2019-12-26 MED ORDER — SODIUM CHLORIDE 0.9 % IV SOLN
50.0000 mg/m2 | Freq: Once | INTRAVENOUS | Status: AC
  Administered 2019-12-26: 120 mg via INTRAVENOUS
  Filled 2019-12-26: qty 12

## 2019-12-26 MED ORDER — SODIUM CHLORIDE 0.9 % IV SOLN
510.0000 mg | Freq: Once | INTRAVENOUS | Status: AC
  Administered 2019-12-26: 510 mg via INTRAVENOUS
  Filled 2019-12-26: qty 510

## 2019-12-26 MED ORDER — SODIUM CHLORIDE 0.9 % IV SOLN: Freq: Once | INTRAVENOUS | Status: AC

## 2019-12-26 MED ORDER — SODIUM CHLORIDE 0.9 % IV SOLN
Freq: Once | INTRAVENOUS | Status: AC
  Filled 2019-12-26: qty 4

## 2019-12-26 MED ORDER — SODIUM CHLORIDE 0.9% FLUSH
10.0000 mL | INTRAVENOUS | Status: DC | PRN
  Administered 2019-12-26: 10 mL

## 2019-12-26 MED ORDER — FAMOTIDINE IN NACL 20-0.9 MG/50ML-% IV SOLN
20.0000 mg | Freq: Once | INTRAVENOUS | Status: AC
  Administered 2019-12-26: 20 mg via INTRAVENOUS
  Filled 2019-12-26: qty 50

## 2019-12-26 NOTE — Patient Instructions (Signed)
[facility] Discharge Instructions for Patients Receiving Chemotherapy  Today you received the following chemotherapy agents   To help prevent nausea and vomiting after your treatment, we encourage you to take your nausea medication   If you develop nausea and vomiting that is not controlled by your nausea medication, call the clinic.   BELOW ARE SYMPTOMS THAT SHOULD BE REPORTED IMMEDIATELY:  *FEVER GREATER THAN 100.5 F  *CHILLS WITH OR WITHOUT FEVER  NAUSEA AND VOMITING THAT IS NOT CONTROLLED WITH YOUR NAUSEA MEDICATION  *UNUSUAL SHORTNESS OF BREATH  *UNUSUAL BRUISING OR BLEEDING  TENDERNESS IN MOUTH AND THROAT WITH OR WITHOUT PRESENCE OF ULCERS  *URINARY PROBLEMS  *BOWEL PROBLEMS  UNUSUAL RASH Items with * indicate a potential emergency and should be followed up as soon as possible.  Feel free to call the clinic should you have any questions or concerns. The clinic phone number is [phone].  Please show the CHEMO ALERT CARD at check-in to the Emergency Department and triage nurse.   

## 2019-12-26 NOTE — Progress Notes (Signed)
Patient presents today for follow up visit and treatment. Vital signs within parameters for treatment. Labs pending. Patient teaching performed pertaining to Neulasta injection on Wednesday. Understanding verbalized. Patient has no complaints of any changes since his last visit.   Creatinine 1.50, Calcium 8.8 and Hgb 9.5 today. Labs within parameters for treatment.   Treatment given today per MD orders. Tolerated infusion without adverse affects. Vital signs stable. No complaints at this time. Patient given schedule with highlighted and written return dates for Neulasta injection. Understanding verbalized.  Discharged from clinic ambulatory. F/U with [facility] as scheduled.

## 2019-12-26 NOTE — Progress Notes (Signed)
[name], [place] [id number]   CLINIC:  Medical Oncology/Hematology  PCP:  Celene Squibb, MD [address] [place] [address] [phone]   REASON FOR VISIT:  Follow-up for metastatic prostate cancer    INTERVAL HISTORY:  [name] 79 y.o. male seen for follow-up of metastatic castration resistant prostate cancer prior to his chemotherapy and toxicity assessment.  Appetite is 75%.  Energy levels are 50%.  Occasional right leg pain after each treatment of chemotherapy reported.  Occasional numbness at times of the hands and feet present.  Denies any GI side effects including nausea vomiting or diarrhea.  No fevers or chills reported.  REVIEW OF SYSTEMS:  Review of Systems  Neurological: Positive for numbness.  All other systems reviewed and are negative.    PAST MEDICAL/SURGICAL HISTORY:  Past Medical History:  Diagnosis Date  . Cancer [facility])    prostate  . Family history of breast cancer   . Family history of prostate cancer   . GERD (gastroesophageal reflux disease)   . HOH (hard of hearing)   . Hyperlipidemia   . Hypertension   . Port-A-Cath in place 08/29/2019  . Prostate cancer metastatic to bone ([place]) 04/04/2016  . Sleep apnea 10/24/2016   Past Surgical History:  Procedure Laterality Date  . CATARACT EXTRACTION W/PHACO Right 04/22/2019   Procedure: CATARACT EXTRACTION PHACO AND INTRAOCULAR LENS PLACEMENT RIGHT EYE;  Surgeon: Baruch Goldmann, MD;  Location: [facility];  Service: Ophthalmology;  Laterality: Right;  right  . CATARACT EXTRACTION W/PHACO Left 05/13/2019   Procedure: CATARACT EXTRACTION PHACO AND INTRAOCULAR LENS PLACEMENT LEFT EYE;  Surgeon: Baruch Goldmann, MD;  Location: [facility];  Service: Ophthalmology;  Laterality: Left;  left  . CHOLECYSTECTOMY    . CYSTOSCOPY W/ URETERAL STENT PLACEMENT Left 08/17/2019   Procedure: CYSTOSCOPY WITH RETROGRADE PYELOGRAM/URETERAL STENT PLACEMENT;  Surgeon: Cleon Gustin, MD;   Location: [facility];  Service: Urology;  Laterality: Left;  . HAND SURGERY    . INGUINAL HERNIA REPAIR Left   . PORTACATH PLACEMENT Left 09/07/2019   Procedure: INSERTION PORT-A-CATH;  Surgeon: Virl Cagey, MD;  Location: [facility];  Service: General;  Laterality: Left;  . PROSTATECTOMY    . PROSTATECTOMY    . radical prostatectomy    . TONSILLECTOMY       SOCIAL HISTORY:  Social History   Socioeconomic History  . Marital status: Widowed    Spouse name: Not on file  . Number of children: 3  . Years of education: 9  . Highest education level: Not on file  Occupational History  . Occupation: Animator- retired    Comment: [employer]  Tobacco Use  . Smoking status: Former Smoker    Packs/day: 1.00    Years: 32.00    Pack years: 32.00    Types: Cigarettes    Quit date: 02/04/1989    Years since quitting: 30.9  . Smokeless tobacco: Never Used  Substance and Sexual Activity  . Alcohol use: No  . Drug use: No  . Sexual activity: Not Currently  Other Topics Concern  . Not on file  Social History Narrative   Widowed   Loves with son   Chronic low back pain   Social Determinants of Health   Financial Resource Strain:   . Difficulty of Paying Living Expenses: Not on file  Food Insecurity:   . Worried About Charity fundraiser in the Last Year: Not on file  .  Ran Out of Food in the Last Year: Not on file  Transportation Needs:   . Lack of Transportation (Medical): Not on file  . Lack of Transportation (Non-Medical): Not on file  Physical Activity:   . Days of Exercise per Week: Not on file  . Minutes of Exercise per Session: Not on file  Stress:   . Feeling of Stress : Not on file  Social Connections:   . Frequency of Communication with Friends and Family: Not on file  . Frequency of Social Gatherings with Friends and Family: Not on file  . Attends Religious Services: Not on file  . Active Member of Clubs or Organizations: Not on file  . Attends Theatre manager Meetings: Not on file  . Marital Status: Not on file  Intimate Partner Violence:   . Fear of Current or Ex-Partner: Not on file  . Emotionally Abused: Not on file  . Physically Abused: Not on file  . Sexually Abused: Not on file    FAMILY HISTORY:  Family History  Problem Relation Age of Onset  . Heart failure Mother        CHF  . Cancer Mother        unknown  . Prostate cancer Father   . Narcolepsy Sister   . Cancer Daughter        unknown  . Breast cancer Maternal Aunt   . Cancer Maternal Grandmother        unknown  . Cancer Maternal Aunt        unknown type of cancer    CURRENT MEDICATIONS:  Outpatient Encounter Medications as of 12/26/2019  Medication Sig  . calcium-vitamin D (OSCAL WITH D) 500-200 MG-UNIT TABS tablet Take 1 tablet by mouth daily.  . cetirizine (ZYRTEC) 10 MG tablet TAKE ONE TABLET BY MOUTH DAILY.  . DOCETAXEL IV Inject into the vein every 21 ( twenty-one) days.  . Ferrous Sulfate (IRON) 325 (65 Fe) MG TABS Take 1 tablet by mouth daily.  . fluticasone (FLONASE) 50 MCG/ACT nasal spray USE ONE SPRAY IN EACH NOSTRIL ONCE DAILY. SHAKE GENTLY BEFORE USING.  . furosemide (LASIX) 40 MG tablet TAKE ONE TABLET BY MOUTH DAILY. TAKE WITH POTASSIUM.  Marland Kitchen hydrochlorothiazide (HYDRODIURIL) 25 MG tablet TAKE ONE TABLET BY MOUTH DAILY.  Marland Kitchen ILEVRO 0.3 % ophthalmic suspension INSTILL ONE DROP IN THE OPERATIVE EYE DAILY. START TWO DAYS BEFORE SURGERY AND CONTINUE AS DIRECTED.  Marland Kitchen ketorolac (ACULAR) 0.5 % ophthalmic solution INSTILL ONE DROP IN THE OPERATIVE EYE TWICE DAILY.  Marland Kitchen losartan (COZAAR) 100 MG tablet Take 100 mg by mouth daily.  . meloxicam (MOBIC) 7.5 MG tablet Take 1 tablet (7.5 mg total) daily by mouth.  . methylphenidate (RITALIN) 20 MG tablet Take 20 mg by mouth daily.  . mirabegron ER (MYRBETRIQ) 25 MG TB24 tablet Take by mouth.  . potassium chloride (KLOR-CON) 20 MEQ packet Take by mouth.  . benazepril (LOTENSIN) 20 MG tablet TAKE ONE TABLET BY  MOUTH DAILY.  . clotrimazole-betamethasone (LOTRISONE) cream Apply 1 application topically 2 (two) times daily. (Patient not taking: Reported on 11/14/2019)  . ketoconazole (NIZORAL) 2 % cream APPLY A SMALL AMOUNT TO FACE RASH TWICE DAILY FOR UP TO FOUR WEEKS IF NEEDED.  Marland Kitchen lidocaine-prilocaine (EMLA) cream Apply small amount to port a cath site and cover with plastic wrap 1 hour prior to chemotherapy appointments (Patient not taking: Reported on 10/31/2019)  . moxifloxacin (VIGAMOX) 0.5 % ophthalmic solution INSTILL ONE DROP THREE TIMES DAILY IN THE  OPERATIVE EYE. START TWO DAYS BEFORE SURGERY AND CONTINUE AS DIRECTED.  Marland Kitchen prednisoLONE acetate (PRED FORTE) 1 % ophthalmic suspension INSTILL ONE DROP IN THE OPERATIVE EYE THREE TIMES DAILY. START ONE DAY AFTER SURGERY AND CONTINUE AS DIRECTED.  Marland Kitchen prochlorperazine (COMPAZINE) 10 MG tablet Take 1 tablet (10 mg total) by mouth every 6 (six) hours as needed for nausea or vomiting. (Patient not taking: Reported on 12/05/2019)  . selenium sulfide (SELSUN) 2.5 % shampoo Apply 1 application topically daily as needed for irritation. (Patient not taking: Reported on 10/24/2019)  . traMADol (ULTRAM) 50 MG tablet Take 1 tablet (50 mg total) by mouth every 6 (six) hours as needed. (Patient not taking: Reported on 10/24/2019)   No facility-administered encounter medications on file as of 12/26/2019.    ALLERGIES:  No Known Allergies   PHYSICAL EXAM:  ECOG Performance status: 1  Vitals:   12/26/19 1012  BP: 136/63  Pulse: 95  Resp: 18  Temp: 97.9 F (36.6 C)  SpO2: 100%   Filed Weights   12/26/19 1012  Weight: 267 lb 3.2 oz (121.2 kg)    Physical Exam Vitals reviewed.  Constitutional:      Appearance: Normal appearance.  Cardiovascular:     Rate and Rhythm: Normal rate and regular rhythm.     Heart sounds: Normal heart sounds.  Pulmonary:     Effort: Pulmonary effort is normal.     Breath sounds: Normal breath sounds.  Abdominal:     General:  There is no distension.     Palpations: Abdomen is soft. There is no mass.  Musculoskeletal:        General: No swelling.     Right lower leg: Edema present.     Left lower leg: Edema present.  Skin:    General: Skin is warm.  Neurological:     General: No focal deficit present.     Mental Status: He is alert and oriented to person, place, and time.  Psychiatric:        Mood and Affect: Mood normal.        Behavior: Behavior normal.      LABORATORY DATA:  I have reviewed the labs as listed.  CBC    Component Value Date/Time   WBC 7.2 12/26/2019 0935   RBC 3.18 (L) 12/26/2019 0935   HGB 9.5 (L) 12/26/2019 0935   HCT 30.0 (L) 12/26/2019 0935   PLT 213 12/26/2019 0935   MCV 94.3 12/26/2019 0935   MCH 29.9 12/26/2019 0935   MCHC 31.7 12/26/2019 0935   RDW 17.3 (H) 12/26/2019 0935   LYMPHSABS 1.5 12/26/2019 0935   MONOABS 0.5 12/26/2019 0935   EOSABS 0.2 12/26/2019 0935   BASOSABS 0.0 12/26/2019 0935   CMP Latest Ref Rng & Units 12/26/2019 12/05/2019 11/14/2019  Glucose 70 - 99 mg/dL 82 93 96  BUN 8 - 23 mg/dL 17 23 13   Creatinine 0.61 - 1.24 mg/dL 1.50(H) 1.68(H) 1.44(H)  Sodium 135 - 145 mmol/L 137 139 139  Potassium 3.5 - 5.1 mmol/L 3.9 3.8 3.7  Chloride 98 - 111 mmol/L 104 106 107  CO2 22 - 32 mmol/L 25 24 25   Calcium 8.9 - 10.3 mg/dL 8.8(L) 8.9 8.7(L)  Total Protein 6.5 - 8.1 g/dL 6.9 6.8 6.7  Total Bilirubin 0.3 - 1.2 mg/dL 0.7 0.7 0.6  Alkaline Phos 38 - 126 U/L 75 73 87  AST 15 - 41 U/L 24 22 21   ALT 0 - 44 U/L 15 16 17  DIAGNOSTIC IMAGING:  I have independently reviewed scans and discussed with the patient.   I have reviewed Venita Lick LPN's note and agree with the documentation.  I personally performed a face-to-face visit, made revisions and my assessment and plan is as follows.    ASSESSMENT & PLAN:   Prostate cancer metastatic to bone ([place]) 1.  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 -Germline mutation testing is negative.  Guardant 360 did not show any targetable mutations. -PSA on 08/11/2019 was 16.71.  PSA on 11/14/2019 was 28. -I have independently reviewed CTAP from 12/01/2019.  It showed mild enlargement of the left iliac adenopathy measuring 6.4 x 4.0 cm previously 5.6 x 3.7 cm.  However this was compared to CT scan from September 2020.  He did not start chemotherapy until mid November. -I have reviewed his CBC which shows normal white count and platelets.  Creatinine is 1.5.  LFTs are normal. -He will proceed with his next treatment today.  We will see him back in 3 weeks for follow-up.  2.  Bone metastasis: -His calcium is within normal limits.  He will proceed with denosumab injection.  He will continue calcium and vitamin D.  3.  CKD: -Today his creatinine is 1.5.  Baseline creatinine is between 1.5-2.0.  4.  Normocytic anemia: -Hemoglobin today is 9.5.  This is combination anemia from CKD and relative iron deficiency and bone marrow suppression. -I reviewed labs from 12/05/2019.  Ferritin is 246 and percent saturation is 21.  123456 and folic acid was normal. -He will benefit from parenteral iron therapy.  We talked about initiating him on Feraheme 2 infusions along with his treatments.  We discussed about side effects in detail.    Orders placed this encounter:  Orders Placed This Encounter  Procedures  . CBC with Differential/Platelet  . Comprehensive metabolic panel  . PSA     Derek Jack, MD [place] [phone]

## 2019-12-26 NOTE — Patient Instructions (Signed)
[facility] at [facility] Discharge Instructions  You were seen today by Dr. Katragadda. He went over your recent lab results. He will see you back in  for labs and follow up.   Thank you for choosing [facility] at [facility] to provide your oncology and hematology care.  To afford each patient quality time with our provider, please arrive at least 15 minutes before your scheduled appointment time.   If you have a lab appointment with the Cancer Center please come in thru the  Main Entrance and check in at the main information desk  You need to re-schedule your appointment should you arrive 10 or more minutes late.  We strive to give you quality time with our providers, and arriving late affects you and other patients whose appointments are after yours.  Also, if you no show three or more times for appointments you may be dismissed from the clinic at the providers discretion.     Again, thank you for choosing [facility].  Our hope is that these requests will decrease the amount of time that you wait before being seen by our physicians.       _____________________________________________________________  Should you have questions after your visit to [facility], please contact our office at [phone] between the hours of 8:00 a.m. and 4:30 p.m.  Voicemails left after 4:00 p.m. will not be returned until the following business day.  For prescription refill requests, have your pharmacy contact our office and allow 72 hours.    Cancer Center Support Programs:   > Cancer Support Group  2nd Tuesday of the month 1pm-2pm, Journey Room    

## 2019-12-26 NOTE — Assessment & Plan Note (Signed)
1.  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 -Germline mutation testing is negative.  Guardant 360 did not show any targetable mutations. -PSA on 08/11/2019 was 16.71.  PSA on 11/14/2019 was 28. -I have independently reviewed CTAP from 12/01/2019.  It showed mild enlargement of the left iliac adenopathy measuring 6.4 x 4.0 cm previously 5.6 x 3.7 cm.  However this was compared to CT scan from September 2020.  He did not start chemotherapy until mid November. -I have reviewed his CBC which shows normal white count and platelets.  Creatinine is 1.5.  LFTs are normal. -He will proceed with his next treatment today.  We will see him back in 3 weeks for follow-up.  2.  Bone metastasis: -His calcium is within normal limits.  He will proceed with denosumab injection.  He will continue calcium and vitamin D.  3.  CKD: -Today his creatinine is 1.5.  Baseline creatinine is between 1.5-2.0.  4.  Normocytic anemia: -Hemoglobin today is 9.5.  This is combination anemia from CKD and relative iron deficiency and bone marrow suppression. -I reviewed labs from 12/05/2019.  Ferritin is 246 and percent saturation is 21.  123456 and folic acid was normal. -He will benefit from parenteral iron therapy.  We talked about initiating him on Feraheme 2 infusions along with his treatments.  We discussed about side effects in detail.

## 2019-12-26 NOTE — Progress Notes (Signed)
Patient has been assessed, vital signs and labs have been reviewed by Dr. Delton Coombes. ANC, Creatinine, LFTs, and Platelets are within treatment parameters per Dr. Delton Coombes. The patient is good to proceed with treatment at this time. Please give Feraheme today per Dr. Delton Coombes.

## 2019-12-28 ENCOUNTER — Encounter (HOSPITAL_COMMUNITY): Payer: Self-pay

## 2019-12-28 ENCOUNTER — Other Ambulatory Visit: Payer: Self-pay

## 2019-12-28 ENCOUNTER — Inpatient Hospital Stay (HOSPITAL_COMMUNITY): Payer: Medicare Other

## 2019-12-28 VITALS — BP 153/79 | HR 99 | Temp 97.7°F | Resp 18

## 2019-12-28 DIAGNOSIS — C61 Malignant neoplasm of prostate: Secondary | ICD-10-CM

## 2019-12-28 DIAGNOSIS — D701 Agranulocytosis secondary to cancer chemotherapy: Secondary | ICD-10-CM | POA: Diagnosis not present

## 2019-12-28 DIAGNOSIS — Z95828 Presence of other vascular implants and grafts: Secondary | ICD-10-CM

## 2019-12-28 DIAGNOSIS — C7951 Secondary malignant neoplasm of bone: Secondary | ICD-10-CM | POA: Diagnosis not present

## 2019-12-28 DIAGNOSIS — E611 Iron deficiency: Secondary | ICD-10-CM | POA: Diagnosis not present

## 2019-12-28 DIAGNOSIS — Z5189 Encounter for other specified aftercare: Secondary | ICD-10-CM | POA: Diagnosis not present

## 2019-12-28 DIAGNOSIS — Z5111 Encounter for antineoplastic chemotherapy: Secondary | ICD-10-CM | POA: Diagnosis not present

## 2019-12-28 MED ORDER — PEGFILGRASTIM INJECTION 6 MG/0.6ML ~~LOC~~
6.0000 mg | PREFILLED_SYRINGE | Freq: Once | SUBCUTANEOUS | Status: AC
  Administered 2019-12-28: 6 mg via SUBCUTANEOUS

## 2019-12-29 DIAGNOSIS — C7951 Secondary malignant neoplasm of bone: Secondary | ICD-10-CM | POA: Diagnosis not present

## 2020-01-12 NOTE — Progress Notes (Signed)

## 2020-01-16 ENCOUNTER — Inpatient Hospital Stay (HOSPITAL_COMMUNITY): Payer: Medicare Other

## 2020-01-16 ENCOUNTER — Inpatient Hospital Stay (HOSPITAL_BASED_OUTPATIENT_CLINIC_OR_DEPARTMENT_OTHER): Payer: Medicare Other | Admitting: Hematology

## 2020-01-16 ENCOUNTER — Other Ambulatory Visit: Payer: Self-pay

## 2020-01-16 ENCOUNTER — Encounter (HOSPITAL_COMMUNITY): Payer: Self-pay | Admitting: Hematology

## 2020-01-16 VITALS — BP 104/61 | HR 88 | Temp 96.9°F | Resp 18 | Wt 263.6 lb

## 2020-01-16 VITALS — BP 135/68 | HR 84 | Temp 98.5°F | Resp 18

## 2020-01-16 DIAGNOSIS — C61 Malignant neoplasm of prostate: Secondary | ICD-10-CM

## 2020-01-16 DIAGNOSIS — C7951 Secondary malignant neoplasm of bone: Secondary | ICD-10-CM | POA: Diagnosis not present

## 2020-01-16 DIAGNOSIS — Z95828 Presence of other vascular implants and grafts: Secondary | ICD-10-CM

## 2020-01-16 DIAGNOSIS — D701 Agranulocytosis secondary to cancer chemotherapy: Secondary | ICD-10-CM | POA: Diagnosis not present

## 2020-01-16 DIAGNOSIS — R350 Frequency of micturition: Secondary | ICD-10-CM | POA: Diagnosis not present

## 2020-01-16 DIAGNOSIS — Z5189 Encounter for other specified aftercare: Secondary | ICD-10-CM | POA: Diagnosis not present

## 2020-01-16 DIAGNOSIS — Z5111 Encounter for antineoplastic chemotherapy: Secondary | ICD-10-CM | POA: Diagnosis not present

## 2020-01-16 DIAGNOSIS — E611 Iron deficiency: Secondary | ICD-10-CM | POA: Diagnosis not present

## 2020-01-16 LAB — CBC WITH DIFFERENTIAL/PLATELET
Abs Immature Granulocytes: 0.03 10*3/uL (ref 0.00–0.07)
Basophils Absolute: 0 10*3/uL (ref 0.0–0.1)
Basophils Relative: 1 %
Eosinophils Absolute: 0.4 10*3/uL (ref 0.0–0.5)
Eosinophils Relative: 6 %
HCT: 31 % — ABNORMAL LOW (ref 39.0–52.0)
Hemoglobin: 9.9 g/dL — ABNORMAL LOW (ref 13.0–17.0)
Immature Granulocytes: 0 %
Lymphocytes Relative: 20 %
Lymphs Abs: 1.5 10*3/uL (ref 0.7–4.0)
MCH: 29.9 pg (ref 26.0–34.0)
MCHC: 31.9 g/dL (ref 30.0–36.0)
MCV: 93.7 fL (ref 80.0–100.0)
Monocytes Absolute: 0.5 10*3/uL (ref 0.1–1.0)
Monocytes Relative: 6 %
Neutro Abs: 5.1 10*3/uL (ref 1.7–7.7)
Neutrophils Relative %: 67 %
Platelets: 209 10*3/uL (ref 150–400)
RBC: 3.31 MIL/uL — ABNORMAL LOW (ref 4.22–5.81)
RDW: 16.3 % — ABNORMAL HIGH (ref 11.5–15.5)
WBC: 7.6 10*3/uL (ref 4.0–10.5)
nRBC: 0 % (ref 0.0–0.2)

## 2020-01-16 LAB — COMPREHENSIVE METABOLIC PANEL
ALT: 17 U/L (ref 0–44)
AST: 23 U/L (ref 15–41)
Albumin: 4 g/dL (ref 3.5–5.0)
Alkaline Phosphatase: 81 U/L (ref 38–126)
Anion gap: 9 (ref 5–15)
BUN: 19 mg/dL (ref 8–23)
CO2: 24 mmol/L (ref 22–32)
Calcium: 9.5 mg/dL (ref 8.9–10.3)
Chloride: 105 mmol/L (ref 98–111)
Creatinine, Ser: 1.61 mg/dL — ABNORMAL HIGH (ref 0.61–1.24)
GFR calc Af Amer: 46 mL/min — ABNORMAL LOW (ref >60)
GFR calc non Af Amer: 40 mL/min — ABNORMAL LOW (ref >60)
Glucose, Bld: 100 mg/dL — ABNORMAL HIGH (ref 70–99)
Potassium: 3.9 mmol/L (ref 3.5–5.1)
Sodium: 138 mmol/L (ref 135–145)
Total Bilirubin: 0.4 mg/dL (ref 0.3–1.2)
Total Protein: 7.3 g/dL (ref 6.5–8.1)

## 2020-01-16 LAB — URINALYSIS, ROUTINE W REFLEX MICROSCOPIC
Bilirubin Urine: NEGATIVE
Glucose, UA: NEGATIVE mg/dL
Ketones, ur: NEGATIVE mg/dL
Leukocytes,Ua: NEGATIVE
Nitrite: NEGATIVE
Protein, ur: NEGATIVE mg/dL
Specific Gravity, Urine: 1.008 (ref 1.005–1.030)
pH: 5 (ref 5.0–8.0)

## 2020-01-16 LAB — PSA: Prostatic Specific Antigen: 6.89 ng/mL — ABNORMAL HIGH (ref 0.00–4.00)

## 2020-01-16 MED ORDER — FAMOTIDINE IN NACL 20-0.9 MG/50ML-% IV SOLN
20.0000 mg | Freq: Once | INTRAVENOUS | Status: AC
  Administered 2020-01-16: 11:00:00 20 mg via INTRAVENOUS
  Filled 2020-01-16: qty 50

## 2020-01-16 MED ORDER — DIPHENHYDRAMINE HCL 50 MG/ML IJ SOLN
25.0000 mg | Freq: Once | INTRAMUSCULAR | Status: AC
  Administered 2020-01-16: 11:00:00 25 mg via INTRAVENOUS
  Filled 2020-01-16: qty 1

## 2020-01-16 MED ORDER — HEPARIN SOD (PORK) LOCK FLUSH 100 UNIT/ML IV SOLN
500.0000 [IU] | Freq: Once | INTRAVENOUS | Status: AC | PRN
  Administered 2020-01-16: 500 [IU]

## 2020-01-16 MED ORDER — SODIUM CHLORIDE 0.9 % IV SOLN
510.0000 mg | Freq: Once | INTRAVENOUS | Status: AC
  Administered 2020-01-16: 510 mg via INTRAVENOUS
  Filled 2020-01-16: qty 510

## 2020-01-16 MED ORDER — SODIUM CHLORIDE 0.9% FLUSH
10.0000 mL | INTRAVENOUS | Status: DC | PRN
  Administered 2020-01-16: 10:00:00 10 mL

## 2020-01-16 MED ORDER — SODIUM CHLORIDE 0.9 % IV SOLN
Freq: Once | INTRAVENOUS | Status: AC
  Filled 2020-01-16: qty 4

## 2020-01-16 MED ORDER — DENOSUMAB 120 MG/1.7ML ~~LOC~~ SOLN
120.0000 mg | Freq: Once | SUBCUTANEOUS | Status: AC
  Administered 2020-01-16: 11:00:00 120 mg via SUBCUTANEOUS
  Filled 2020-01-16: qty 1.7

## 2020-01-16 MED ORDER — SODIUM CHLORIDE 0.9 % IV SOLN: Freq: Once | INTRAVENOUS | Status: AC

## 2020-01-16 MED ORDER — SODIUM CHLORIDE 0.9 % IV SOLN
50.0000 mg/m2 | Freq: Once | INTRAVENOUS | Status: AC
  Administered 2020-01-16: 120 mg via INTRAVENOUS
  Filled 2020-01-16: qty 12

## 2020-01-16 NOTE — Progress Notes (Signed)
[name] tolerated Taxotere  And Feraheme infusions and Xgeva injection well without complaints or incident. Labs reviewed with and pt seen by Dr. Delton Coombes prior to administering these medications. Calcium 9.5 today and pt denied any tooth or jaw pain prior to administering the Xgeva injection. VSS upon discharge. Pt discharged self ambulatory in satisfactory condition

## 2020-01-16 NOTE — Progress Notes (Signed)
[place], [place] [address]   CLINIC:  Medical Oncology/Hematology  PCP:  Celene Squibb, MD [place] [address] [phone]   REASON FOR VISIT:  Follow-up for metastatic prostate cancer    INTERVAL HISTORY:  [name] 79 y.o. 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  He has received cycle 6 of docetaxel on 12/26/2019.  Denied any tingling or numbness in extremities.  Reports increased urinary frequency having to go to the bathroom every hour.  Denies any dysuria.  Denies any fevers or chills.  No bleeding per rectum or melena.  Appetite and energy levels are 75%.  Denies any falls.  REVIEW OF SYSTEMS:  Review of Systems  Genitourinary: Positive for frequency.   All other systems reviewed and are negative.    PAST MEDICAL/SURGICAL HISTORY:  Past Medical History:  Diagnosis Date  . Cancer [facility])    prostate  . Family history of breast cancer   . Family history of prostate cancer   . GERD (gastroesophageal reflux disease)   . HOH (hard of hearing)   . Hyperlipidemia   . Hypertension   . Port-A-Cath in place 08/29/2019  . Prostate cancer metastatic to bone ([place]) 04/04/2016  . Sleep apnea 10/24/2016   Past Surgical History:  Procedure Laterality Date  . CATARACT EXTRACTION W/PHACO Right 04/22/2019   Procedure: CATARACT EXTRACTION PHACO AND INTRAOCULAR LENS PLACEMENT RIGHT EYE;  Surgeon: Baruch Goldmann, MD;  Location: [facility];  Service: Ophthalmology;  Laterality: Right;  right  . CATARACT EXTRACTION W/PHACO Left 05/13/2019   Procedure: CATARACT EXTRACTION PHACO AND INTRAOCULAR LENS PLACEMENT LEFT EYE;  Surgeon: Baruch Goldmann, MD;  Location: [facility];  Service: Ophthalmology;  Laterality: Left;  left  . CHOLECYSTECTOMY    . CYSTOSCOPY W/ URETERAL STENT PLACEMENT Left 08/17/2019   Procedure: CYSTOSCOPY WITH RETROGRADE PYELOGRAM/URETERAL STENT PLACEMENT;  Surgeon: Cleon Gustin, MD;  Location: [facility];  Service: Urology;  Laterality: Left;  . HAND SURGERY    . INGUINAL HERNIA REPAIR Left   . PORTACATH PLACEMENT Left 09/07/2019   Procedure: INSERTION PORT-A-CATH;  Surgeon: Virl Cagey, MD;  Location: [facility];  Service: General;  Laterality: Left;  . PROSTATECTOMY    . PROSTATECTOMY    . radical prostatectomy    . TONSILLECTOMY       SOCIAL HISTORY:  Social History   Socioeconomic History  . Marital status: Widowed    Spouse name: Not on file  . Number of children: 3  . Years of education: 9  . Highest education level: Not on file  Occupational History  . Occupation: Animator- retired    Comment: central [employer]  Tobacco Use  . Smoking status: Former Smoker    Packs/day: 1.00    Years: 32.00    Pack years: 32.00    Types: Cigarettes    Quit date: 02/04/1989    Years since quitting: 30.9  . Smokeless tobacco: Never Used  Substance and Sexual Activity  . Alcohol use: No  . Drug use: No  . Sexual activity: Not Currently  Other Topics Concern  . Not on file  Social History Narrative   Widowed   Loves with son   Chronic low back pain   Social Determinants of Health   Financial Resource Strain:   . Difficulty of Paying Living Expenses:   Food Insecurity:   . Worried About Charity fundraiser in  the Last Year:   . [place] in the Last Year:   Transportation Needs:   . Film/video editor (Medical):   Marland Kitchen Lack of Transportation (Non-Medical):   Physical Activity:   . Days of Exercise per Week:   . Minutes of Exercise per Session:   Stress:   . Feeling of Stress :   Social Connections:   . Frequency of Communication with Friends and Family:   . Frequency of Social Gatherings with Friends and Family:   . Attends Religious Services:   . Active Member of Clubs or Organizations:   . Attends Archivist Meetings:   Marland Kitchen Marital Status:   Intimate Partner Violence:   . Fear of Current or Ex-Partner:   .  Emotionally Abused:   Marland Kitchen Physically Abused:   . Sexually Abused:     FAMILY HISTORY:  Family History  Problem Relation Age of Onset  . Heart failure Mother        CHF  . Cancer Mother        unknown  . Prostate cancer Father   . Narcolepsy Sister   . Cancer Daughter        unknown  . Breast cancer Maternal Aunt   . Cancer Maternal Grandmother        unknown  . Cancer Maternal Aunt        unknown type of cancer    CURRENT MEDICATIONS:  Outpatient Encounter Medications as of 01/16/2020  Medication Sig  . benazepril (LOTENSIN) 20 MG tablet TAKE ONE TABLET BY MOUTH DAILY.  . calcium-vitamin D (OSCAL WITH D) 500-200 MG-UNIT TABS tablet Take 1 tablet by mouth daily.  . cetirizine (ZYRTEC) 10 MG tablet TAKE ONE TABLET BY MOUTH DAILY.  . DOCETAXEL IV Inject into the vein every 21 ( twenty-one) days.  . Ferrous Sulfate (IRON) 325 (65 Fe) MG TABS Take 1 tablet by mouth daily.  . fluticasone (FLONASE) 50 MCG/ACT nasal spray USE ONE SPRAY IN EACH NOSTRIL ONCE DAILY. SHAKE GENTLY BEFORE USING.  . furosemide (LASIX) 40 MG tablet TAKE ONE TABLET BY MOUTH DAILY. TAKE WITH POTASSIUM.  Marland Kitchen hydrochlorothiazide (HYDRODIURIL) 25 MG tablet TAKE ONE TABLET BY MOUTH DAILY.  Marland Kitchen ketorolac (ACULAR) 0.5 % ophthalmic solution INSTILL ONE DROP IN THE OPERATIVE EYE TWICE DAILY.  Marland Kitchen losartan (COZAAR) 100 MG tablet Take 100 mg by mouth daily.  . meloxicam (MOBIC) 7.5 MG tablet Take 1 tablet (7.5 mg total) daily by mouth.  . methylphenidate (RITALIN) 20 MG tablet Take 20 mg by mouth daily.  . mirabegron ER (MYRBETRIQ) 25 MG TB24 tablet Take by mouth.  . potassium chloride (KLOR-CON) 20 MEQ packet Take by mouth.  . clotrimazole-betamethasone (LOTRISONE) cream Apply 1 application topically 2 (two) times daily. (Patient not taking: Reported on 11/14/2019)  . ILEVRO 0.3 % ophthalmic suspension INSTILL ONE DROP IN THE OPERATIVE EYE DAILY. START TWO DAYS BEFORE SURGERY AND CONTINUE AS DIRECTED.  Marland Kitchen ketoconazole (NIZORAL)  2 % cream APPLY A SMALL AMOUNT TO FACE RASH TWICE DAILY FOR UP TO FOUR WEEKS IF NEEDED.  Marland Kitchen lidocaine-prilocaine (EMLA) cream Apply small amount to port a cath site and cover with plastic wrap 1 hour prior to chemotherapy appointments (Patient not taking: Reported on 10/31/2019)  . moxifloxacin (VIGAMOX) 0.5 % ophthalmic solution INSTILL ONE DROP THREE TIMES DAILY IN THE OPERATIVE EYE. START TWO DAYS BEFORE SURGERY AND CONTINUE AS DIRECTED.  Marland Kitchen prednisoLONE acetate (PRED FORTE) 1 % ophthalmic suspension INSTILL ONE DROP IN THE  OPERATIVE EYE THREE TIMES DAILY. START ONE DAY AFTER SURGERY AND CONTINUE AS DIRECTED.  Marland Kitchen prochlorperazine (COMPAZINE) 10 MG tablet Take 1 tablet (10 mg total) by mouth every 6 (six) hours as needed for nausea or vomiting. (Patient not taking: Reported on 12/05/2019)  . selenium sulfide (SELSUN) 2.5 % shampoo Apply 1 application topically daily as needed for irritation. (Patient not taking: Reported on 10/24/2019)  . traMADol (ULTRAM) 50 MG tablet Take 1 tablet (50 mg total) by mouth every 6 (six) hours as needed. (Patient not taking: Reported on 10/24/2019)   No facility-administered encounter medications on file as of 01/16/2020.    ALLERGIES:  No Known Allergies   PHYSICAL EXAM:  ECOG Performance status: 1  Vitals:   01/16/20 0928  BP: 104/61  Pulse: 88  Resp: 18  Temp: (!) 96.9 F (36.1 C)  SpO2: 100%   Filed Weights   01/16/20 0928  Weight: 263 lb 9.6 oz (119.6 kg)    Physical Exam Vitals reviewed.  Constitutional:      Appearance: Normal appearance.  Cardiovascular:     Rate and Rhythm: Normal rate and regular rhythm.     Heart sounds: Normal heart sounds.  Pulmonary:     Effort: Pulmonary effort is normal.     Breath sounds: Normal breath sounds.  Abdominal:     General: There is no distension.     Palpations: Abdomen is soft. There is no mass.  Musculoskeletal:        General: No swelling.     Right lower leg: Edema present.     Left lower leg:  Edema present.  Skin:    General: Skin is warm.  Neurological:     General: No focal deficit present.     Mental Status: He is alert and oriented to person, place, and time.  Psychiatric:        Mood and Affect: Mood normal.        Behavior: Behavior normal.      LABORATORY DATA:  I have reviewed the labs as listed.  CBC    Component Value Date/Time   WBC 7.6 01/16/2020 0932   RBC 3.31 (L) 01/16/2020 0932   HGB 9.9 (L) 01/16/2020 0932   HCT 31.0 (L) 01/16/2020 0932   PLT 209 01/16/2020 0932   MCV 93.7 01/16/2020 0932   MCH 29.9 01/16/2020 0932   MCHC 31.9 01/16/2020 0932   RDW 16.3 (H) 01/16/2020 0932   LYMPHSABS 1.5 01/16/2020 0932   MONOABS 0.5 01/16/2020 0932   EOSABS 0.4 01/16/2020 0932   BASOSABS 0.0 01/16/2020 0932   CMP Latest Ref Rng & Units 01/16/2020 12/26/2019 12/05/2019  Glucose 70 - 99 mg/dL 100(H) 82 93  BUN 8 - 23 mg/dL 19 17 23   Creatinine 0.61 - 1.24 mg/dL 1.61(H) 1.50(H) 1.68(H)  Sodium 135 - 145 mmol/L 138 137 139  Potassium 3.5 - 5.1 mmol/L 3.9 3.9 3.8  Chloride 98 - 111 mmol/L 105 104 106  CO2 22 - 32 mmol/L 24 25 24   Calcium 8.9 - 10.3 mg/dL 9.5 8.8(L) 8.9  Total Protein 6.5 - 8.1 g/dL 7.3 6.9 6.8  Total Bilirubin 0.3 - 1.2 mg/dL 0.4 0.7 0.7  Alkaline Phos 38 - 126 U/L 81 75 73  AST 15 - 41 U/L 23 24 22   ALT 0 - 44 U/L 17 15 16        DIAGNOSTIC IMAGING:  I have independently reviewed the scans.   I have reviewed Venita Lick LPN's note and agree  with the documentation.  I personally performed a face-to-face visit, made revisions and my assessment and plan is as follows.    ASSESSMENT & PLAN:   Prostate cancer metastatic to bone ([place]) 1.  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 -Germline mutation testing is negative.  Guardant 360 was negative. -6 cycles of docetaxel from 09/08/2019 through 12/26/2019. -CTAP on 12/01/2019 showed mild enlargement of left iliac adenopathy  measuring 6.4 x 4 cm, previously 5.6 x 3.7 cm.  This was compared with CT scan from September 2020. -He is tolerating docetaxel reasonably well.  We reviewed his CBC which is grossly normal.  LFTs are normal. -He will proceed with cycle 7 today.  We will check PSA at next cycle.  Last PSA was 28.97 on 18 2021. -He complained of increased frequency of urination.  We checked a UA which was negative for infection.  2.  Bone metastasis: -His calcium today is 9.5.  He will proceed with Xgeva.  3.  CKD: -His baseline creatinine is between 1.5-2.0.  Today's creatinine is 1.61.  We will closely monitor it.  4.  Normocytic anemia: -This is from combination of CKD, relative iron deficiency and bone marrow suppression. -He received Feraheme on 12/26/2019.  He will receive second dose today.  Hemoglobin today is 9.5.    Orders placed this encounter:  Orders Placed This Encounter  Procedures  . Urinalysis, Routine w reflex microscopic     [name] Jack, MD [place] [phone]

## 2020-01-16 NOTE — Patient Instructions (Addendum)
[facility] Discharge Instructions for Patients Receiving Chemotherapy   Beginning January 23rd 2017 lab work for the [facility] will be done in the  Main lab at [facility] on 1st floor. If you have a lab appointment with the [facility] please come in thru the  Main Entrance and check in at the main information desk   Today you received the following chemotherapy agents Taxotere as well as Feraheme infusion and Xgeva injection. Follow-up as scheduled. Call clinic for any questions or concerns  To help prevent nausea and vomiting after your treatment, we encourage you to take your nausea medication   If you develop nausea and vomiting, or diarrhea that is not controlled by your medication, call the clinic.  The clinic phone number is [phone]. Office hours are Monday-Friday 8:30am-5:00pm.  BELOW ARE SYMPTOMS THAT SHOULD BE REPORTED IMMEDIATELY:  *FEVER GREATER THAN 101.0 F  *CHILLS WITH OR WITHOUT FEVER  NAUSEA AND VOMITING THAT IS NOT CONTROLLED WITH YOUR NAUSEA MEDICATION  *UNUSUAL SHORTNESS OF BREATH  *UNUSUAL BRUISING OR BLEEDING  TENDERNESS IN MOUTH AND THROAT WITH OR WITHOUT PRESENCE OF ULCERS  *URINARY PROBLEMS  *BOWEL PROBLEMS  UNUSUAL RASH Items with * indicate a potential emergency and should be followed up as soon as possible. If you have an emergency after office hours please contact your primary care physician or go to the nearest emergency department.  Please call the clinic during office hours if you have any questions or concerns.   You may also contact the Patient Navigator at [phone] should you have any questions or need assistance in obtaining follow up care.      Resources For Cancer Patients and their Caregivers ? American Cancer Society: Can assist with transportation, wigs, general needs, runs Look Good Feel Better.        865-401-5077 ? Cancer Care: Provides financial assistance, online support groups,  medication/co-pay assistance.  1-800-813-HOPE 340 504 6199) ? [place] Assists [place] cancer patients and their families through emotional , educational and financial support.  469-735-2501 ? [place] DSS Where to apply for food stamps, Medicaid and utility assistance. (801)442-7495 ? [facility]: Transportation to medical appointments. 706-319-6412 ? Social Security Administration: May apply for disability if have a Stage IV cancer. [phone] 352-729-4596 ? [employer], Disability and Transit Services: Assists with nutrition, care and transit needs. 5204572609

## 2020-01-16 NOTE — Progress Notes (Signed)
Patient has been assessed, vital signs and labs have been reviewed by Dr. Delton Coombes. ANC, Creatinine, LFTs, and Platelets are within treatment parameters per Dr. Delton Coombes. The patient is good to proceed with treatment at this time. Please give 2nd dose of Feraheme as well as Xgeva today. Please also obtain a urinalysis, order is in, per Dr. Delton Coombes.

## 2020-01-16 NOTE — Assessment & Plan Note (Signed)
1.  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 -Germline mutation testing is negative.  Guardant 360 was negative. -6 cycles of docetaxel from 09/08/2019 through 12/26/2019. -CTAP on 12/01/2019 showed mild enlargement of left iliac adenopathy measuring 6.4 x 4 cm, previously 5.6 x 3.7 cm.  This was compared with CT scan from September 2020. -He is tolerating docetaxel reasonably well.  We reviewed his CBC which is grossly normal.  LFTs are normal. -He will proceed with cycle 7 today.  We will check PSA at next cycle.  Last PSA was 28.97 on 18 2021. -He complained of increased frequency of urination.  We checked a UA which was negative for infection.  2.  Bone metastasis: -His calcium today is 9.5.  He will proceed with Xgeva.  3.  CKD: -His baseline creatinine is between 1.5-2.0.  Today's creatinine is 1.61.  We will closely monitor it.  4.  Normocytic anemia: -This is from combination of CKD, relative iron deficiency and bone marrow suppression. -He received Feraheme on 12/26/2019.  He will receive second dose today.  Hemoglobin today is 9.5.

## 2020-01-16 NOTE — Patient Instructions (Addendum)
Cancer Center at [facility] Discharge Instructions  You were seen today by Dr. Katragadda. He went over your recent lab results. He will see you back in 3 weeks for labs, treatment and follow up.   Thank you for choosing  Cancer Center at [facility] to provide your oncology and hematology care.  To afford each patient quality time with our provider, please arrive at least 15 minutes before your scheduled appointment time.   If you have a lab appointment with the Cancer Center please come in thru the  Main Entrance and check in at the main information desk  You need to re-schedule your appointment should you arrive 10 or more minutes late.  We strive to give you quality time with our providers, and arriving late affects you and other patients whose appointments are after yours.  Also, if you no show three or more times for appointments you may be dismissed from the clinic at the providers discretion.     Again, thank you for choosing [facility].  Our hope is that these requests will decrease the amount of time that you wait before being seen by our physicians.       _____________________________________________________________  Should you have questions after your visit to [facility], please contact our office at [phone] between the hours of 8:00 a.m. and 4:30 p.m.  Voicemails left after 4:00 p.m. will not be returned until the following business day.  For prescription refill requests, have your pharmacy contact our office and allow 72 hours.    Cancer Center Support Programs:   > Cancer Support Group  2nd Tuesday of the month 1pm-2pm, Journey Room    

## 2020-01-16 NOTE — Patient Instructions (Signed)
[facility] Discharge Instructions  Labs drawn from portacath today   Thank you for choosing [facility] at [facility] to provide your oncology and hematology care.  To afford each patient quality time with our provider, please arrive at least 15 minutes before your scheduled appointment time.   If you have a lab appointment with the Cancer Center please come in thru the Main Entrance and check in at the main information desk.  You need to re-schedule your appointment should you arrive 10 or more minutes late.  We strive to give you quality time with our providers, and arriving late affects you and other patients whose appointments are after yours.  Also, if you no show three or more times for appointments you may be dismissed from the clinic at the providers discretion.     Again, thank you for choosing [facility].  Our hope is that these requests will decrease the amount of time that you wait before being seen by our physicians.       _____________________________________________________________  Should you have questions after your visit to [facility], please contact our office at [phone] between the hours of 8:00 a.m. and 4:30 p.m.  Voicemails left after 4:00 p.m. will not be returned until the following business day.  For prescription refill requests, have your pharmacy contact our office and allow 72 hours.    Due to Covid, you will need to wear a mask upon entering the hospital. If you do not have a mask, a mask will be given to you at the Main Entrance upon arrival. For doctor visits, patients may have 1 support person with them. For treatment visits, patients can not have anyone with them due to social distancing guidelines and our immunocompromised population.     

## 2020-01-17 ENCOUNTER — Inpatient Hospital Stay (HOSPITAL_COMMUNITY): Payer: Medicare Other

## 2020-01-17 ENCOUNTER — Encounter (HOSPITAL_COMMUNITY): Payer: Self-pay

## 2020-01-17 VITALS — BP 138/59 | HR 88 | Temp 96.5°F | Resp 18

## 2020-01-17 DIAGNOSIS — C7951 Secondary malignant neoplasm of bone: Secondary | ICD-10-CM | POA: Diagnosis not present

## 2020-01-17 DIAGNOSIS — Z5111 Encounter for antineoplastic chemotherapy: Secondary | ICD-10-CM | POA: Diagnosis not present

## 2020-01-17 DIAGNOSIS — Z5189 Encounter for other specified aftercare: Secondary | ICD-10-CM | POA: Diagnosis not present

## 2020-01-17 DIAGNOSIS — D701 Agranulocytosis secondary to cancer chemotherapy: Secondary | ICD-10-CM | POA: Diagnosis not present

## 2020-01-17 DIAGNOSIS — C61 Malignant neoplasm of prostate: Secondary | ICD-10-CM

## 2020-01-17 DIAGNOSIS — Z95828 Presence of other vascular implants and grafts: Secondary | ICD-10-CM

## 2020-01-17 DIAGNOSIS — E611 Iron deficiency: Secondary | ICD-10-CM | POA: Diagnosis not present

## 2020-01-17 MED ORDER — PEGFILGRASTIM INJECTION 6 MG/0.6ML ~~LOC~~
PREFILLED_SYRINGE | SUBCUTANEOUS | Status: AC
  Filled 2020-01-17: qty 0.6

## 2020-01-17 MED ORDER — PEGFILGRASTIM INJECTION 6 MG/0.6ML ~~LOC~~
6.0000 mg | PREFILLED_SYRINGE | Freq: Once | SUBCUTANEOUS | Status: AC
  Administered 2020-01-17: 6 mg via SUBCUTANEOUS

## 2020-01-17 NOTE — Progress Notes (Signed)
Patient tolerated Neulasta injection with no complaints voiced.  Site clean and dry with no bruising or swelling noted at site.  Band aid applied.  Vss with discharge and left ambulatory with no s/s of distress noted.

## 2020-01-18 ENCOUNTER — Ambulatory Visit (HOSPITAL_COMMUNITY): Payer: Medicare Other

## 2020-01-30 DIAGNOSIS — C7951 Secondary malignant neoplasm of bone: Secondary | ICD-10-CM | POA: Diagnosis not present

## 2020-01-30 DIAGNOSIS — I129 Hypertensive chronic kidney disease with stage 1 through stage 4 chronic kidney disease, or unspecified chronic kidney disease: Secondary | ICD-10-CM | POA: Diagnosis not present

## 2020-01-30 DIAGNOSIS — M79606 Pain in leg, unspecified: Secondary | ICD-10-CM | POA: Diagnosis not present

## 2020-01-31 NOTE — Progress Notes (Signed)

## 2020-02-02 DIAGNOSIS — H3540 Unspecified peripheral retinal degeneration: Secondary | ICD-10-CM | POA: Diagnosis not present

## 2020-02-02 DIAGNOSIS — Z961 Presence of intraocular lens: Secondary | ICD-10-CM | POA: Diagnosis not present

## 2020-02-06 ENCOUNTER — Encounter (HOSPITAL_COMMUNITY): Payer: Self-pay | Admitting: Hematology

## 2020-02-06 ENCOUNTER — Inpatient Hospital Stay (HOSPITAL_BASED_OUTPATIENT_CLINIC_OR_DEPARTMENT_OTHER): Payer: Medicare Other | Admitting: Hematology

## 2020-02-06 ENCOUNTER — Other Ambulatory Visit: Payer: Self-pay

## 2020-02-06 ENCOUNTER — Inpatient Hospital Stay (HOSPITAL_COMMUNITY): Payer: Medicare Other

## 2020-02-06 ENCOUNTER — Inpatient Hospital Stay (HOSPITAL_COMMUNITY): Payer: Medicare Other | Attending: Hematology

## 2020-02-06 VITALS — BP 137/68 | HR 98 | Temp 96.9°F | Resp 18 | Wt 261.8 lb

## 2020-02-06 VITALS — BP 138/79 | HR 82 | Temp 97.9°F | Resp 18

## 2020-02-06 DIAGNOSIS — Z5189 Encounter for other specified aftercare: Secondary | ICD-10-CM | POA: Diagnosis not present

## 2020-02-06 DIAGNOSIS — Z95828 Presence of other vascular implants and grafts: Secondary | ICD-10-CM

## 2020-02-06 DIAGNOSIS — Z5111 Encounter for antineoplastic chemotherapy: Secondary | ICD-10-CM | POA: Diagnosis not present

## 2020-02-06 DIAGNOSIS — C7951 Secondary malignant neoplasm of bone: Secondary | ICD-10-CM | POA: Diagnosis not present

## 2020-02-06 DIAGNOSIS — C61 Malignant neoplasm of prostate: Secondary | ICD-10-CM | POA: Insufficient documentation

## 2020-02-06 LAB — CBC WITH DIFFERENTIAL/PLATELET
Abs Immature Granulocytes: 0.02 10*3/uL (ref 0.00–0.07)
Basophils Absolute: 0.1 10*3/uL (ref 0.0–0.1)
Basophils Relative: 1 %
Eosinophils Absolute: 0.7 10*3/uL — ABNORMAL HIGH (ref 0.0–0.5)
Eosinophils Relative: 13 %
HCT: 31.5 % — ABNORMAL LOW (ref 39.0–52.0)
Hemoglobin: 9.9 g/dL — ABNORMAL LOW (ref 13.0–17.0)
Immature Granulocytes: 0 %
Lymphocytes Relative: 24 %
Lymphs Abs: 1.3 10*3/uL (ref 0.7–4.0)
MCH: 29.6 pg (ref 26.0–34.0)
MCHC: 31.4 g/dL (ref 30.0–36.0)
MCV: 94 fL (ref 80.0–100.0)
Monocytes Absolute: 0.4 10*3/uL (ref 0.1–1.0)
Monocytes Relative: 7 %
Neutro Abs: 3 10*3/uL (ref 1.7–7.7)
Neutrophils Relative %: 55 %
Platelets: 212 10*3/uL (ref 150–400)
RBC: 3.35 MIL/uL — ABNORMAL LOW (ref 4.22–5.81)
RDW: 16.2 % — ABNORMAL HIGH (ref 11.5–15.5)
WBC: 5.5 10*3/uL (ref 4.0–10.5)
nRBC: 0 % (ref 0.0–0.2)

## 2020-02-06 LAB — COMPREHENSIVE METABOLIC PANEL
ALT: 16 U/L (ref 0–44)
AST: 23 U/L (ref 15–41)
Albumin: 3.8 g/dL (ref 3.5–5.0)
Alkaline Phosphatase: 74 U/L (ref 38–126)
Anion gap: 12 (ref 5–15)
BUN: 15 mg/dL (ref 8–23)
CO2: 24 mmol/L (ref 22–32)
Calcium: 9.6 mg/dL (ref 8.9–10.3)
Chloride: 104 mmol/L (ref 98–111)
Creatinine, Ser: 1.64 mg/dL — ABNORMAL HIGH (ref 0.61–1.24)
GFR calc Af Amer: 45 mL/min — ABNORMAL LOW (ref >60)
GFR calc non Af Amer: 39 mL/min — ABNORMAL LOW (ref >60)
Glucose, Bld: 113 mg/dL — ABNORMAL HIGH (ref 70–99)
Potassium: 3.6 mmol/L (ref 3.5–5.1)
Sodium: 140 mmol/L (ref 135–145)
Total Bilirubin: 0.4 mg/dL (ref 0.3–1.2)
Total Protein: 7 g/dL (ref 6.5–8.1)

## 2020-02-06 LAB — MAGNESIUM: Magnesium: 1.9 mg/dL (ref 1.7–2.4)

## 2020-02-06 LAB — PSA: Prostatic Specific Antigen: 5.9 ng/mL — ABNORMAL HIGH (ref 0.00–4.00)

## 2020-02-06 MED ORDER — SODIUM CHLORIDE 0.9 % IV SOLN
Freq: Once | INTRAVENOUS | Status: AC
  Filled 2020-02-06: qty 4

## 2020-02-06 MED ORDER — SODIUM CHLORIDE 0.9 % IV SOLN
50.0000 mg/m2 | Freq: Once | INTRAVENOUS | Status: AC
  Administered 2020-02-06: 12:00:00 120 mg via INTRAVENOUS
  Filled 2020-02-06: qty 12

## 2020-02-06 MED ORDER — SODIUM CHLORIDE 0.9% FLUSH
10.0000 mL | INTRAVENOUS | Status: DC | PRN
  Administered 2020-02-06: 10 mL

## 2020-02-06 MED ORDER — DIPHENHYDRAMINE HCL 50 MG/ML IJ SOLN
25.0000 mg | Freq: Once | INTRAMUSCULAR | Status: AC
  Administered 2020-02-06: 25 mg via INTRAVENOUS
  Filled 2020-02-06: qty 1

## 2020-02-06 MED ORDER — HEPARIN SOD (PORK) LOCK FLUSH 100 UNIT/ML IV SOLN
500.0000 [IU] | Freq: Once | INTRAVENOUS | Status: AC | PRN
  Administered 2020-02-06: 500 [IU]

## 2020-02-06 MED ORDER — FAMOTIDINE IN NACL 20-0.9 MG/50ML-% IV SOLN
20.0000 mg | Freq: Once | INTRAVENOUS | Status: AC
  Administered 2020-02-06: 20 mg via INTRAVENOUS
  Filled 2020-02-06: qty 50

## 2020-02-06 MED ORDER — PREDNISONE 5 MG PO TABS: 5.0000 mg | ORAL_TABLET | Freq: Every day | ORAL | 2 refills | Status: DC

## 2020-02-06 MED ORDER — SODIUM CHLORIDE 0.9 % IV SOLN: Freq: Once | INTRAVENOUS | Status: AC

## 2020-02-06 NOTE — Progress Notes (Signed)
[facility] [place], [place] [address]   CLINIC:  Medical Oncology/Hematology  PCP:  Celene Squibb, MD [place] [address] [phone]   REASON FOR VISIT:  Follow-up for metastatic prostate cancer    INTERVAL HISTORY:  Mr. Rosenboom 79 y.o. 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  Appetite and energy levels are 50%.  Reports chronic left leg pain.  Reports some chronic cough which is also stable.  Had some nausea but denied any vomiting after last treatment.  Continues to have chronic problem of increased urinary frequency.  REVIEW OF SYSTEMS:  Review of Systems  Respiratory: Positive for cough.   Gastrointestinal: Positive for nausea.  Genitourinary: Positive for frequency.   All other systems reviewed and are negative.    PAST MEDICAL/SURGICAL HISTORY:  Past Medical History:  Diagnosis Date  . Cancer [facility])    prostate  . Family history of breast cancer   . Family history of prostate cancer   . GERD (gastroesophageal reflux disease)   . HOH (hard of hearing)   . Hyperlipidemia   . Hypertension   . Port-A-Cath in place 08/29/2019  . Prostate cancer metastatic to bone ([place]) 04/04/2016  . Sleep apnea 10/24/2016   Past Surgical History:  Procedure Laterality Date  . CATARACT EXTRACTION W/PHACO Right 04/22/2019   Procedure: CATARACT EXTRACTION PHACO AND INTRAOCULAR LENS PLACEMENT RIGHT EYE;  Surgeon: Baruch Goldmann, MD;  Location: [facility];  Service: Ophthalmology;  Laterality: Right;  right  . CATARACT EXTRACTION W/PHACO Left 05/13/2019   Procedure: CATARACT EXTRACTION PHACO AND INTRAOCULAR LENS PLACEMENT LEFT EYE;  Surgeon: Baruch Goldmann, MD;  Location: [facility];  Service: Ophthalmology;  Laterality: Left;  left  . CHOLECYSTECTOMY    . CYSTOSCOPY W/ URETERAL STENT PLACEMENT Left 08/17/2019   Procedure: CYSTOSCOPY WITH RETROGRADE PYELOGRAM/URETERAL STENT PLACEMENT;  Surgeon: Cleon Gustin,  MD;  Location: [facility];  Service: Urology;  Laterality: Left;  . HAND SURGERY    . INGUINAL HERNIA REPAIR Left   . PORTACATH PLACEMENT Left 09/07/2019   Procedure: INSERTION PORT-A-CATH;  Surgeon: Virl Cagey, MD;  Location: [facility];  Service: General;  Laterality: Left;  . PROSTATECTOMY    . PROSTATECTOMY    . radical prostatectomy    . TONSILLECTOMY       SOCIAL HISTORY:  Social History   Socioeconomic History  . Marital status: Widowed    Spouse name: Not on file  . Number of children: 3  . Years of education: 9  . Highest education level: Not on file  Occupational History  . Occupation: Animator- retired    Comment: central [employer]  Tobacco Use  . Smoking status: Former Smoker    Packs/day: 1.00    Years: 32.00    Pack years: 32.00    Types: Cigarettes    Quit date: 02/04/1989    Years since quitting: 31.0  . Smokeless tobacco: Never Used  Substance and Sexual Activity  . Alcohol use: No  . Drug use: No  . Sexual activity: Not Currently  Other Topics Concern  . Not on file  Social History Narrative   Widowed   Loves with son   Chronic low back pain   Social Determinants of Health   Financial Resource Strain:   . Difficulty of Paying Living Expenses:   Food Insecurity:   . Worried About Charity fundraiser in the Last Year:   .  Ran Out of Food in the Last Year:   Transportation Needs:   . Film/video editor (Medical):   Marland Kitchen Lack of Transportation (Non-Medical):   Physical Activity:   . Days of Exercise per Week:   . Minutes of Exercise per Session:   Stress:   . Feeling of Stress :   Social Connections:   . Frequency of Communication with Friends and Family:   . Frequency of Social Gatherings with Friends and Family:   . Attends Religious Services:   . Active Member of Clubs or Organizations:   . Attends Archivist Meetings:   Marland Kitchen Marital Status:   Intimate Partner Violence:   . Fear of Current or Ex-Partner:   . Emotionally  Abused:   Marland Kitchen Physically Abused:   . Sexually Abused:     FAMILY HISTORY:  Family History  Problem Relation Age of Onset  . Heart failure Mother        CHF  . Cancer Mother        unknown  . Prostate cancer Father   . Narcolepsy Sister   . Cancer Daughter        unknown  . Breast cancer Maternal Aunt   . Cancer Maternal Grandmother        unknown  . Cancer Maternal Aunt        unknown type of cancer    CURRENT MEDICATIONS:  Outpatient Encounter Medications as of 02/06/2020  Medication Sig  . benazepril (LOTENSIN) 20 MG tablet TAKE ONE TABLET BY MOUTH DAILY.  . calcium-vitamin D (OSCAL WITH D) 500-200 MG-UNIT TABS tablet Take 1 tablet by mouth daily.  . cetirizine (ZYRTEC) 10 MG tablet TAKE ONE TABLET BY MOUTH DAILY.  . clotrimazole-betamethasone (LOTRISONE) cream Apply 1 application topically 2 (two) times daily.  . DOCETAXEL IV Inject into the vein every 21 ( twenty-one) days.  . Ferrous Sulfate (IRON) 325 (65 Fe) MG TABS Take 1 tablet by mouth daily.  . fluticasone (FLONASE) 50 MCG/ACT nasal spray USE ONE SPRAY IN EACH NOSTRIL ONCE DAILY. SHAKE GENTLY BEFORE USING.  . furosemide (LASIX) 40 MG tablet TAKE ONE TABLET BY MOUTH DAILY. TAKE WITH POTASSIUM.  Marland Kitchen hydrochlorothiazide (HYDRODIURIL) 25 MG tablet TAKE ONE TABLET BY MOUTH DAILY.  Marland Kitchen ILEVRO 0.3 % ophthalmic suspension INSTILL ONE DROP IN THE OPERATIVE EYE DAILY. START TWO DAYS BEFORE SURGERY AND CONTINUE AS DIRECTED.  Marland Kitchen ketorolac (ACULAR) 0.5 % ophthalmic solution INSTILL ONE DROP IN THE OPERATIVE EYE TWICE DAILY.  Marland Kitchen losartan (COZAAR) 100 MG tablet Take 100 mg by mouth daily.  . meloxicam (MOBIC) 7.5 MG tablet Take 1 tablet (7.5 mg total) daily by mouth.  . methylphenidate (RITALIN) 20 MG tablet Take 20 mg by mouth daily.  . mirabegron ER (MYRBETRIQ) 25 MG TB24 tablet Take by mouth.  . moxifloxacin (VIGAMOX) 0.5 % ophthalmic solution INSTILL ONE DROP THREE TIMES DAILY IN THE OPERATIVE EYE. START TWO DAYS BEFORE SURGERY AND  CONTINUE AS DIRECTED.  Marland Kitchen potassium chloride (KLOR-CON) 20 MEQ packet Take by mouth.  . prednisoLONE acetate (PRED FORTE) 1 % ophthalmic suspension INSTILL ONE DROP IN THE OPERATIVE EYE THREE TIMES DAILY. START ONE DAY AFTER SURGERY AND CONTINUE AS DIRECTED.  Marland Kitchen ketoconazole (NIZORAL) 2 % cream APPLY A SMALL AMOUNT TO FACE RASH TWICE DAILY FOR UP TO FOUR WEEKS IF NEEDED.  Marland Kitchen lidocaine-prilocaine (EMLA) cream Apply small amount to port a cath site and cover with plastic wrap 1 hour prior to chemotherapy appointments (Patient not taking: Reported  on 02/06/2020)  . predniSONE (DELTASONE) 5 MG tablet Take 1 tablet (5 mg total) by mouth daily with breakfast.  . prochlorperazine (COMPAZINE) 10 MG tablet Take 1 tablet (10 mg total) by mouth every 6 (six) hours as needed for nausea or vomiting. (Patient not taking: Reported on 02/06/2020)  . selenium sulfide (SELSUN) 2.5 % shampoo Apply 1 application topically daily as needed for irritation. (Patient not taking: Reported on 02/06/2020)  . traMADol (ULTRAM) 50 MG tablet Take 1 tablet (50 mg total) by mouth every 6 (six) hours as needed. (Patient not taking: Reported on 02/06/2020)   No facility-administered encounter medications on file as of 02/06/2020.    ALLERGIES:  No Known Allergies   PHYSICAL EXAM:  ECOG Performance status: 1  Vitals:   02/06/20 0954  BP: 137/68  Pulse: 98  Resp: 18  Temp: (!) 96.9 F (36.1 C)  SpO2: 99%   Filed Weights   02/06/20 0954  Weight: 261 lb 12.8 oz (118.8 kg)    Physical Exam Vitals reviewed.  Constitutional:      Appearance: Normal appearance.  Cardiovascular:     Rate and Rhythm: Normal rate and regular rhythm.     Heart sounds: Normal heart sounds.  Pulmonary:     Effort: Pulmonary effort is normal.     Breath sounds: Normal breath sounds.  Abdominal:     General: There is no distension.     Palpations: Abdomen is soft. There is no mass.  Musculoskeletal:        General: No swelling.     Right  lower leg: Edema present.     Left lower leg: Edema present.  Skin:    General: Skin is warm.  Neurological:     General: No focal deficit present.     Mental Status: He is alert and oriented to person, place, and time.  Psychiatric:        Mood and Affect: Mood normal.        Behavior: Behavior normal.      LABORATORY DATA:  I have reviewed the labs as listed.  CBC    Component Value Date/Time   WBC 5.5 02/06/2020 0940   RBC 3.35 (L) 02/06/2020 0940   HGB 9.9 (L) 02/06/2020 0940   HCT 31.5 (L) 02/06/2020 0940   PLT 212 02/06/2020 0940   MCV 94.0 02/06/2020 0940   MCH 29.6 02/06/2020 0940   MCHC 31.4 02/06/2020 0940   RDW 16.2 (H) 02/06/2020 0940   LYMPHSABS 1.3 02/06/2020 0940   MONOABS 0.4 02/06/2020 0940   EOSABS 0.7 (H) 02/06/2020 0940   BASOSABS 0.1 02/06/2020 0940   CMP Latest Ref Rng & Units 02/06/2020 01/16/2020 12/26/2019  Glucose 70 - 99 mg/dL 113(H) 100(H) 82  BUN 8 - 23 mg/dL 15 19 17   Creatinine 0.61 - 1.24 mg/dL 1.64(H) 1.61(H) 1.50(H)  Sodium 135 - 145 mmol/L 140 138 137  Potassium 3.5 - 5.1 mmol/L 3.6 3.9 3.9  Chloride 98 - 111 mmol/L 104 105 104  CO2 22 - 32 mmol/L 24 24 25   Calcium 8.9 - 10.3 mg/dL 9.6 9.5 8.8(L)  Total Protein 6.5 - 8.1 g/dL 7.0 7.3 6.9  Total Bilirubin 0.3 - 1.2 mg/dL 0.4 0.4 0.7  Alkaline Phos 38 - 126 U/L 74 81 75  AST 15 - 41 U/L 23 23 24   ALT 0 - 44 U/L 16 17 15        DIAGNOSTIC IMAGING:  I have reviewed scans.   I have reviewed Caryl Pina  Darnelle Maffucci LPN's note and agree with the documentation.  I personally performed a face-to-face visit, made revisions and my assessment and plan is as follows.    ASSESSMENT & PLAN:   Prostate cancer metastatic to bone ([place]) 1.  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 -Germline mutation testing negative.  Guardant 360 -. -7 cycles of docetaxel from 09/18/2019 through 01/16/2020. -CTAP on 12/01/2019 showed mild  enlargement of left iliac adenopathy measuring 6.4 x 4 cm, previously 5.6 x 3.7 cm.  This was compared to CT scan from September 2020. -He has tolerated last cycle very well.  His PSA today is down to 5.9.  CBC and LFTs are within normal limits. -He will proceed with his treatment today.  We will give docetaxel at 50 mg/m.  I will reevaluate him in 3 weeks.  2.  Bone metastasis: -Calcium is 9.6.  He will continue monthly denosumab.  3.  CKD: -Baseline creatinine between 1.5-2.0.  Today's creatinine is 1.64.  We will closely monitor it.  4.  Normocytic anemia: -This is from combination of CKD, relative iron deficiency and bone marrow suppression. -Last Feraheme was on 12/26/2019 and 01/16/2020. -Hemoglobin today improved to 9.9.    Orders placed this encounter:  Orders Placed This Encounter  Procedures  . Iron and TIBC  . Ferritin  . Vitamin B12  . Folate     Derek Jack, MD [place] [phone]

## 2020-02-06 NOTE — Progress Notes (Signed)
Patient has been assessed, vital signs and labs have been reviewed by Dr. Katragadda. ANC, Creatinine, LFTs, and Platelets are within treatment parameters per Dr. Katragadda. The patient is good to proceed with treatment at this time.  

## 2020-02-06 NOTE — Progress Notes (Signed)
1020- Lab results and vital signs reviewed and patient seen by Dr. Delton Coombes. Cr increased slightly from 1.61 to 1.64. Reviewed with Dr. Delton Coombes and ok to proceed with treatment today.  [name] tolerated treatment without incident or complaint. VSS. Discharged in satisfactory condition with follow up instructions.

## 2020-02-06 NOTE — Patient Instructions (Signed)
[facility] Discharge Instructions for Patients Receiving Chemotherapy   Beginning January 23rd 2017 lab work for the [facility] will be done in the  Main lab at [facility] on 1st floor. If you have a lab appointment with the [facility] please come in thru the  Main Entrance and check in at the main information desk   Today you received the following chemotherapy agents Taxotere  To help prevent nausea and vomiting after your treatment, we encourage you to take your nausea medication    If you develop nausea and vomiting, or diarrhea that is not controlled by your medication, call the clinic.  The clinic phone number is [phone]. Office hours are Monday-Friday 8:30am-5:00pm.  BELOW ARE SYMPTOMS THAT SHOULD BE REPORTED IMMEDIATELY:  *FEVER GREATER THAN 101.0 F  *CHILLS WITH OR WITHOUT FEVER  NAUSEA AND VOMITING THAT IS NOT CONTROLLED WITH YOUR NAUSEA MEDICATION  *UNUSUAL SHORTNESS OF BREATH  *UNUSUAL BRUISING OR BLEEDING  TENDERNESS IN MOUTH AND THROAT WITH OR WITHOUT PRESENCE OF ULCERS  *URINARY PROBLEMS  *BOWEL PROBLEMS  UNUSUAL RASH Items with * indicate a potential emergency and should be followed up as soon as possible. If you have an emergency after office hours please contact your primary care physician or go to the nearest emergency department.  Please call the clinic during office hours if you have any questions or concerns.   You may also contact the Patient Navigator at [phone] should you have any questions or need assistance in obtaining follow up care.      Resources For Cancer Patients and their Caregivers ? American Cancer Society: Can assist with transportation, wigs, general needs, runs Look Good Feel Better.        6316245852 ? Cancer Care: Provides financial assistance, online support groups, medication/co-pay assistance.  1-800-813-HOPE 440-357-2245) ? [employer] Assists [place]  cancer patients and their families through emotional , educational and financial support.  339-709-2849 ? [place] DSS Where to apply for food stamps, Medicaid and utility assistance. (873)121-5942 ? [employer]: Transportation to medical appointments. 587-344-2867 ? Social Security Administration: May apply for disability if have a Stage IV cancer. (913)296-9593 503-002-9599 ? [employer], Disability and Transit Services: Assists with nutrition, care and transit needs. 478-724-2887

## 2020-02-06 NOTE — Assessment & Plan Note (Signed)
1.  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 -Germline mutation testing negative.  Guardant 360 -. -7 cycles of docetaxel from 09/18/2019 through 01/16/2020. -CTAP on 12/01/2019 showed mild enlargement of left iliac adenopathy measuring 6.4 x 4 cm, previously 5.6 x 3.7 cm.  This was compared to CT scan from September 2020. -He has tolerated last cycle very well.  His PSA today is down to 5.9.  CBC and LFTs are within normal limits. -He will proceed with his treatment today.  We will give docetaxel at 50 mg/m.  I will reevaluate him in 3 weeks.  2.  Bone metastasis: -Calcium is 9.6.  He will continue monthly denosumab.  3.  CKD: -Baseline creatinine between 1.5-2.0.  Today's creatinine is 1.64.  We will closely monitor it.  4.  Normocytic anemia: -This is from combination of CKD, relative iron deficiency and bone marrow suppression. -Last Feraheme was on 12/26/2019 and 01/16/2020. -Hemoglobin today improved to 9.9.

## 2020-02-06 NOTE — Patient Instructions (Addendum)
[facility] Discharge Instructions  You were seen today by Dr. Katragadda. He went over your recent lab results. He will see you back in 3 weeks for labs, treatment and follow up.   Thank you for choosing [facility] at [facility] to provide your oncology and hematology care.  To afford each patient quality time with our provider, please arrive at least 15 minutes before your scheduled appointment time.   If you have a lab appointment with the Cancer Center please come in thru the  Main Entrance and check in at the main information desk  You need to re-schedule your appointment should you arrive 10 or more minutes late.  We strive to give you quality time with our providers, and arriving late affects you and other patients whose appointments are after yours.  Also, if you no show three or more times for appointments you may be dismissed from the clinic at the providers discretion.     Again, thank you for choosing [facility].  Our hope is that these requests will decrease the amount of time that you wait before being seen by our physicians.       _____________________________________________________________  Should you have questions after your visit to [facility], please contact our office at [phone] between the hours of 8:00 a.m. and 4:30 p.m.  Voicemails left after 4:00 p.m. will not be returned until the following business day.  For prescription refill requests, have your pharmacy contact our office and allow 72 hours.    Cancer Center Support Programs:   > Cancer Support Group  2nd Tuesday of the month 1pm-2pm, Journey Room    

## 2020-02-08 ENCOUNTER — Encounter (HOSPITAL_COMMUNITY): Payer: Self-pay

## 2020-02-08 ENCOUNTER — Inpatient Hospital Stay (HOSPITAL_COMMUNITY): Payer: Medicare Other

## 2020-02-08 ENCOUNTER — Other Ambulatory Visit: Payer: Self-pay

## 2020-02-08 VITALS — BP 118/62 | HR 99 | Temp 96.9°F | Resp 18

## 2020-02-08 DIAGNOSIS — C7951 Secondary malignant neoplasm of bone: Secondary | ICD-10-CM | POA: Diagnosis not present

## 2020-02-08 DIAGNOSIS — Z5111 Encounter for antineoplastic chemotherapy: Secondary | ICD-10-CM | POA: Diagnosis not present

## 2020-02-08 DIAGNOSIS — Z95828 Presence of other vascular implants and grafts: Secondary | ICD-10-CM

## 2020-02-08 DIAGNOSIS — Z5189 Encounter for other specified aftercare: Secondary | ICD-10-CM | POA: Diagnosis not present

## 2020-02-08 DIAGNOSIS — M79606 Pain in leg, unspecified: Secondary | ICD-10-CM | POA: Diagnosis not present

## 2020-02-08 DIAGNOSIS — C61 Malignant neoplasm of prostate: Secondary | ICD-10-CM

## 2020-02-08 MED ORDER — PEGFILGRASTIM INJECTION 6 MG/0.6ML ~~LOC~~
6.0000 mg | PREFILLED_SYRINGE | Freq: Once | SUBCUTANEOUS | Status: AC
  Administered 2020-02-08: 6 mg via SUBCUTANEOUS
  Filled 2020-02-08: qty 0.6

## 2020-02-08 NOTE — Progress Notes (Signed)
Patient tolerated injection with no complaints voiced.  Site clean and dry with no bruising or swelling noted at site.  Band aid applied.  Vss with discharge and left ambulatory with no s/s of distress noted.  

## 2020-02-27 ENCOUNTER — Other Ambulatory Visit: Payer: Self-pay

## 2020-02-27 ENCOUNTER — Encounter (HOSPITAL_COMMUNITY): Payer: Self-pay | Admitting: Hematology

## 2020-02-27 ENCOUNTER — Inpatient Hospital Stay (HOSPITAL_COMMUNITY): Payer: Medicare Other

## 2020-02-27 ENCOUNTER — Inpatient Hospital Stay (HOSPITAL_COMMUNITY): Payer: Medicare Other | Attending: Hematology

## 2020-02-27 ENCOUNTER — Inpatient Hospital Stay (HOSPITAL_BASED_OUTPATIENT_CLINIC_OR_DEPARTMENT_OTHER): Payer: Medicare Other | Admitting: Hematology

## 2020-02-27 VITALS — BP 131/75 | HR 81 | Temp 97.0°F | Resp 20

## 2020-02-27 DIAGNOSIS — C61 Malignant neoplasm of prostate: Secondary | ICD-10-CM | POA: Insufficient documentation

## 2020-02-27 DIAGNOSIS — Z87891 Personal history of nicotine dependence: Secondary | ICD-10-CM | POA: Insufficient documentation

## 2020-02-27 DIAGNOSIS — D6489 Other specified anemias: Secondary | ICD-10-CM | POA: Diagnosis not present

## 2020-02-27 DIAGNOSIS — C779 Secondary and unspecified malignant neoplasm of lymph node, unspecified: Secondary | ICD-10-CM | POA: Insufficient documentation

## 2020-02-27 DIAGNOSIS — E611 Iron deficiency: Secondary | ICD-10-CM | POA: Diagnosis not present

## 2020-02-27 DIAGNOSIS — C7951 Secondary malignant neoplasm of bone: Secondary | ICD-10-CM

## 2020-02-27 DIAGNOSIS — Z5189 Encounter for other specified aftercare: Secondary | ICD-10-CM | POA: Insufficient documentation

## 2020-02-27 DIAGNOSIS — Z5111 Encounter for antineoplastic chemotherapy: Secondary | ICD-10-CM | POA: Diagnosis not present

## 2020-02-27 DIAGNOSIS — N189 Chronic kidney disease, unspecified: Secondary | ICD-10-CM | POA: Diagnosis not present

## 2020-02-27 DIAGNOSIS — Z95828 Presence of other vascular implants and grafts: Secondary | ICD-10-CM

## 2020-02-27 LAB — CBC WITH DIFFERENTIAL/PLATELET
Abs Immature Granulocytes: 0.03 10*3/uL (ref 0.00–0.07)
Basophils Absolute: 0.1 10*3/uL (ref 0.0–0.1)
Basophils Relative: 1 %
Eosinophils Absolute: 0.6 10*3/uL — ABNORMAL HIGH (ref 0.0–0.5)
Eosinophils Relative: 8 %
HCT: 31.5 % — ABNORMAL LOW (ref 39.0–52.0)
Hemoglobin: 10 g/dL — ABNORMAL LOW (ref 13.0–17.0)
Immature Granulocytes: 0 %
Lymphocytes Relative: 19 %
Lymphs Abs: 1.5 10*3/uL (ref 0.7–4.0)
MCH: 29.5 pg (ref 26.0–34.0)
MCHC: 31.7 g/dL (ref 30.0–36.0)
MCV: 92.9 fL (ref 80.0–100.0)
Monocytes Absolute: 0.7 10*3/uL (ref 0.1–1.0)
Monocytes Relative: 9 %
Neutro Abs: 4.9 10*3/uL (ref 1.7–7.7)
Neutrophils Relative %: 63 %
Platelets: 228 10*3/uL (ref 150–400)
RBC: 3.39 MIL/uL — ABNORMAL LOW (ref 4.22–5.81)
RDW: 16.1 % — ABNORMAL HIGH (ref 11.5–15.5)
WBC: 7.8 10*3/uL (ref 4.0–10.5)
nRBC: 0 % (ref 0.0–0.2)

## 2020-02-27 LAB — IRON AND TIBC
Iron: 28 ug/dL — ABNORMAL LOW (ref 45–182)
Saturation Ratios: 12 % — ABNORMAL LOW (ref 17.9–39.5)
TIBC: 227 ug/dL — ABNORMAL LOW (ref 250–450)
UIBC: 199 ug/dL

## 2020-02-27 LAB — COMPREHENSIVE METABOLIC PANEL
ALT: 17 U/L (ref 0–44)
AST: 19 U/L (ref 15–41)
Albumin: 3.8 g/dL (ref 3.5–5.0)
Alkaline Phosphatase: 77 U/L (ref 38–126)
Anion gap: 11 (ref 5–15)
BUN: 21 mg/dL (ref 8–23)
CO2: 23 mmol/L (ref 22–32)
Calcium: 9.7 mg/dL (ref 8.9–10.3)
Chloride: 101 mmol/L (ref 98–111)
Creatinine, Ser: 1.68 mg/dL — ABNORMAL HIGH (ref 0.61–1.24)
GFR calc Af Amer: 44 mL/min — ABNORMAL LOW (ref >60)
GFR calc non Af Amer: 38 mL/min — ABNORMAL LOW (ref >60)
Glucose, Bld: 95 mg/dL (ref 70–99)
Potassium: 4 mmol/L (ref 3.5–5.1)
Sodium: 135 mmol/L (ref 135–145)
Total Bilirubin: 0.8 mg/dL (ref 0.3–1.2)
Total Protein: 7.4 g/dL (ref 6.5–8.1)

## 2020-02-27 LAB — FOLATE: Folate: 20.1 ng/mL (ref >5.9)

## 2020-02-27 LAB — FERRITIN: Ferritin: 510 ng/mL — ABNORMAL HIGH (ref 24–336)

## 2020-02-27 LAB — PSA: Prostatic Specific Antigen: 5.52 ng/mL — ABNORMAL HIGH (ref 0.00–4.00)

## 2020-02-27 LAB — VITAMIN B12: Vitamin B-12: 2135 pg/mL — ABNORMAL HIGH (ref 180–914)

## 2020-02-27 MED ORDER — DIPHENHYDRAMINE HCL 50 MG/ML IJ SOLN
INTRAMUSCULAR | Status: AC
  Filled 2020-02-27: qty 1

## 2020-02-27 MED ORDER — HEPARIN SOD (PORK) LOCK FLUSH 100 UNIT/ML IV SOLN
500.0000 [IU] | Freq: Once | INTRAVENOUS | Status: AC | PRN
  Administered 2020-02-27: 500 [IU]

## 2020-02-27 MED ORDER — SODIUM CHLORIDE 0.9 % IV SOLN: Freq: Once | INTRAVENOUS | Status: AC

## 2020-02-27 MED ORDER — DENOSUMAB 120 MG/1.7ML ~~LOC~~ SOLN
120.0000 mg | Freq: Once | SUBCUTANEOUS | Status: AC
  Administered 2020-02-27: 12:00:00 120 mg via SUBCUTANEOUS

## 2020-02-27 MED ORDER — FAMOTIDINE IN NACL 20-0.9 MG/50ML-% IV SOLN
20.0000 mg | Freq: Once | INTRAVENOUS | Status: AC
  Administered 2020-02-27: 12:00:00 20 mg via INTRAVENOUS

## 2020-02-27 MED ORDER — SODIUM CHLORIDE 0.9% FLUSH
10.0000 mL | INTRAVENOUS | Status: DC | PRN
  Administered 2020-02-27: 11:00:00 10 mL

## 2020-02-27 MED ORDER — DENOSUMAB 120 MG/1.7ML ~~LOC~~ SOLN
SUBCUTANEOUS | Status: AC
  Filled 2020-02-27: qty 1.7

## 2020-02-27 MED ORDER — SODIUM CHLORIDE 0.9 % IV SOLN
Freq: Once | INTRAVENOUS | Status: AC
  Filled 2020-02-27: qty 4

## 2020-02-27 MED ORDER — FAMOTIDINE IN NACL 20-0.9 MG/50ML-% IV SOLN
INTRAVENOUS | Status: AC
  Filled 2020-02-27: qty 50

## 2020-02-27 MED ORDER — DIPHENHYDRAMINE HCL 50 MG/ML IJ SOLN
25.0000 mg | Freq: Once | INTRAMUSCULAR | Status: AC
  Administered 2020-02-27: 25 mg via INTRAVENOUS

## 2020-02-27 MED ORDER — SODIUM CHLORIDE 0.9 % IV SOLN
50.0000 mg/m2 | Freq: Once | INTRAVENOUS | Status: AC
  Administered 2020-02-27: 120 mg via INTRAVENOUS
  Filled 2020-02-27: qty 12

## 2020-02-27 NOTE — Progress Notes (Signed)
[name] presents today for D1C9 Docetaxel. Reports no significant changes since last treatment. Lab results and vital signs stable. Also due for Xgeva today. Pt reports taking Ca and Vit D as instructed. Pt denies tooth/jaw pain and denies recent or future invasive dental work. Assessed by Dr. Delton Coombes. Per MD and parameters, ok to proceed with treatment today.  Treatment tolerated without incident or complaint. VSS upon completion of treatment. Discharged self ambulatory in satisfactory condition with follow up instructions.

## 2020-02-27 NOTE — Progress Notes (Signed)
[name], [name] [id number]   CLINIC:  Medical Oncology/Hematology  PCP:  Celene Squibb, MD [place] [address] [phone]   REASON FOR VISIT:  Follow-up for metastatic prostate cancer    INTERVAL HISTORY:  [name] 79 y.o. 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  Denies any major tingling or numbness in extremities.  Very mild numbness in the hands and feet reported.  Mild ankle swellings have been stable.  Appetite and energy levels are 75%.  No fevers or chills reported.  REVIEW OF SYSTEMS:  Review of Systems  Respiratory: Positive for cough.   Genitourinary: Positive for frequency.   Neurological: Positive for numbness.  All other systems reviewed and are negative.    PAST MEDICAL/SURGICAL HISTORY:  Past Medical History:  Diagnosis Date  . Cancer [facility])    prostate  . Family history of breast cancer   . Family history of prostate cancer   . GERD (gastroesophageal reflux disease)   . HOH (hard of hearing)   . Hyperlipidemia   . Hypertension   . Port-A-Cath in place 08/29/2019  . Prostate cancer metastatic to bone ([place]) 04/04/2016  . Sleep apnea 10/24/2016   Past Surgical History:  Procedure Laterality Date  . CATARACT EXTRACTION W/PHACO Right 04/22/2019   Procedure: CATARACT EXTRACTION PHACO AND INTRAOCULAR LENS PLACEMENT RIGHT EYE;  Surgeon: Baruch Goldmann, MD;  Location: [facility];  Service: Ophthalmology;  Laterality: Right;  right  . CATARACT EXTRACTION W/PHACO Left 05/13/2019   Procedure: CATARACT EXTRACTION PHACO AND INTRAOCULAR LENS PLACEMENT LEFT EYE;  Surgeon: Baruch Goldmann, MD;  Location: [facility];  Service: Ophthalmology;  Laterality: Left;  left  . CHOLECYSTECTOMY    . CYSTOSCOPY W/ URETERAL STENT PLACEMENT Left 08/17/2019   Procedure: CYSTOSCOPY WITH RETROGRADE PYELOGRAM/URETERAL STENT PLACEMENT;  Surgeon:  Cleon Gustin, MD;  Location: [facility];  Service: Urology;  Laterality: Left;  . HAND SURGERY    . INGUINAL HERNIA REPAIR Left   . PORTACATH PLACEMENT Left 09/07/2019   Procedure: INSERTION PORT-A-CATH;  Surgeon: Virl Cagey, MD;  Location: [facility];  Service: General;  Laterality: Left;  . PROSTATECTOMY    . PROSTATECTOMY    . radical prostatectomy    . TONSILLECTOMY       SOCIAL HISTORY:  Social History   Socioeconomic History  . Marital status: Widowed    Spouse name: Not on file  . Number of children: 3  . Years of education: 9  . Highest education level: Not on file  Occupational History  . Occupation: Animator- retired    Comment: central [employer]  Tobacco Use  . Smoking status: Former Smoker    Packs/day: 1.00    Years: 32.00    Pack years: 32.00    Types: Cigarettes    Quit date: 02/04/1989    Years since quitting: 31.0  . Smokeless tobacco: Never Used  Substance and Sexual Activity  . Alcohol use: No  . Drug use: No  . Sexual activity: Not Currently  Other Topics Concern  . Not on file  Social History Narrative   Widowed   Loves with son   Chronic low back pain   Social Determinants of Health   Financial Resource Strain:   . Difficulty of Paying Living Expenses:   Food Insecurity:   . Worried About Charity fundraiser in  the Last Year:   . [place] in the Last Year:   Transportation Needs:   . Film/video editor (Medical):   Marland Kitchen Lack of Transportation (Non-Medical):   Physical Activity:   . Days of Exercise per Week:   . Minutes of Exercise per Session:   Stress:   . Feeling of Stress :   Social Connections:   . Frequency of Communication with Friends and Family:   . Frequency of Social Gatherings with Friends and Family:   . Attends Religious Services:   . Active Member of Clubs or Organizations:   . Attends Archivist Meetings:   Marland Kitchen Marital Status:   Intimate Partner Violence:   . Fear of Current or Ex-Partner:     . Emotionally Abused:   Marland Kitchen Physically Abused:   . Sexually Abused:     FAMILY HISTORY:  Family History  Problem Relation Age of Onset  . Heart failure Mother        CHF  . Cancer Mother        unknown  . Prostate cancer Father   . Narcolepsy Sister   . Cancer Daughter        unknown  . Breast cancer Maternal Aunt   . Cancer Maternal Grandmother        unknown  . Cancer Maternal Aunt        unknown type of cancer    CURRENT MEDICATIONS:  Outpatient Encounter Medications as of 02/27/2020  Medication Sig  . benazepril (LOTENSIN) 20 MG tablet TAKE ONE TABLET BY MOUTH DAILY.  . calcium-vitamin D (OSCAL WITH D) 500-200 MG-UNIT TABS tablet Take 1 tablet by mouth daily.  . cetirizine (ZYRTEC) 10 MG tablet TAKE ONE TABLET BY MOUTH DAILY.  . clotrimazole-betamethasone (LOTRISONE) cream Apply 1 application topically 2 (two) times daily.  . DOCETAXEL IV Inject into the vein every 21 ( twenty-one) days.  . Ferrous Sulfate (IRON) 325 (65 Fe) MG TABS Take 1 tablet by mouth daily.  . fluticasone (FLONASE) 50 MCG/ACT nasal spray USE ONE SPRAY IN EACH NOSTRIL ONCE DAILY. SHAKE GENTLY BEFORE USING.  . furosemide (LASIX) 40 MG tablet TAKE ONE TABLET BY MOUTH DAILY. TAKE WITH POTASSIUM.  Marland Kitchen hydrochlorothiazide (HYDRODIURIL) 25 MG tablet TAKE ONE TABLET BY MOUTH DAILY.  Marland Kitchen ILEVRO 0.3 % ophthalmic suspension INSTILL ONE DROP IN THE OPERATIVE EYE DAILY. START TWO DAYS BEFORE SURGERY AND CONTINUE AS DIRECTED.  Marland Kitchen ketoconazole (NIZORAL) 2 % cream APPLY A SMALL AMOUNT TO FACE RASH TWICE DAILY FOR UP TO FOUR WEEKS IF NEEDED.  Marland Kitchen ketorolac (ACULAR) 0.5 % ophthalmic solution INSTILL ONE DROP IN THE OPERATIVE EYE TWICE DAILY.  Marland Kitchen losartan (COZAAR) 100 MG tablet Take 100 mg by mouth daily.  . meloxicam (MOBIC) 7.5 MG tablet Take 1 tablet (7.5 mg total) daily by mouth.  . methylphenidate (RITALIN) 20 MG tablet Take 20 mg by mouth daily.  . mirabegron ER (MYRBETRIQ) 25 MG TB24 tablet Take by mouth.  . moxifloxacin  (VIGAMOX) 0.5 % ophthalmic solution INSTILL ONE DROP THREE TIMES DAILY IN THE OPERATIVE EYE. START TWO DAYS BEFORE SURGERY AND CONTINUE AS DIRECTED.  Marland Kitchen potassium chloride (KLOR-CON) 20 MEQ packet Take by mouth.  . prednisoLONE acetate (PRED FORTE) 1 % ophthalmic suspension INSTILL ONE DROP IN THE OPERATIVE EYE THREE TIMES DAILY. START ONE DAY AFTER SURGERY AND CONTINUE AS DIRECTED.  Marland Kitchen predniSONE (DELTASONE) 5 MG tablet Take 1 tablet (5 mg total) by mouth daily with breakfast.  . prochlorperazine (  COMPAZINE) 10 MG tablet Take 1 tablet (10 mg total) by mouth every 6 (six) hours as needed for nausea or vomiting.  . selenium sulfide (SELSUN) 2.5 % shampoo Apply 1 application topically daily as needed for irritation.  . lidocaine-prilocaine (EMLA) cream Apply small amount to port a cath site and cover with plastic wrap 1 hour prior to chemotherapy appointments (Patient not taking: Reported on 02/27/2020)  . traMADol (ULTRAM) 50 MG tablet Take 1 tablet (50 mg total) by mouth every 6 (six) hours as needed. (Patient not taking: Reported on 02/27/2020)   No facility-administered encounter medications on file as of 02/27/2020.    ALLERGIES:  No Known Allergies   PHYSICAL EXAM:  ECOG Performance status: 1  Vitals:   02/27/20 1032  BP: 121/68  Pulse: 95  Resp: 20  Temp: (!) 97.3 F (36.3 C)  SpO2: 100%   Filed Weights   02/27/20 1032  Weight: 257 lb 12.8 oz (116.9 kg)    Physical Exam Vitals reviewed.  Constitutional:      Appearance: Normal appearance.  Cardiovascular:     Rate and Rhythm: Normal rate and regular rhythm.     Heart sounds: Normal heart sounds.  Pulmonary:     Effort: Pulmonary effort is normal.     Breath sounds: Normal breath sounds.  Abdominal:     General: There is no distension.     Palpations: Abdomen is soft. There is no mass.  Musculoskeletal:        General: No swelling.     Right lower leg: Edema present.     Left lower leg: Edema present.  Skin:    General:  Skin is warm.  Neurological:     General: No focal deficit present.     Mental Status: He is alert and oriented to person, place, and time.  Psychiatric:        Mood and Affect: Mood normal.        Behavior: Behavior normal.      LABORATORY DATA:  I have reviewed the labs as listed.  CBC    Component Value Date/Time   WBC 7.8 02/27/2020 1029   RBC 3.39 (L) 02/27/2020 1029   HGB 10.0 (L) 02/27/2020 1029   HCT 31.5 (L) 02/27/2020 1029   PLT 228 02/27/2020 1029   MCV 92.9 02/27/2020 1029   MCH 29.5 02/27/2020 1029   MCHC 31.7 02/27/2020 1029   RDW 16.1 (H) 02/27/2020 1029   LYMPHSABS 1.5 02/27/2020 1029   MONOABS 0.7 02/27/2020 1029   EOSABS 0.6 (H) 02/27/2020 1029   BASOSABS 0.1 02/27/2020 1029   CMP Latest Ref Rng & Units 02/27/2020 02/06/2020 01/16/2020  Glucose 70 - 99 mg/dL 95 113(H) 100(H)  BUN 8 - 23 mg/dL 21 15 19   Creatinine 0.61 - 1.24 mg/dL 1.68(H) 1.64(H) 1.61(H)  Sodium 135 - 145 mmol/L 135 140 138  Potassium 3.5 - 5.1 mmol/L 4.0 3.6 3.9  Chloride 98 - 111 mmol/L 101 104 105  CO2 22 - 32 mmol/L 23 24 24   Calcium 8.9 - 10.3 mg/dL 9.7 9.6 9.5  Total Protein 6.5 - 8.1 g/dL 7.4 7.0 7.3  Total Bilirubin 0.3 - 1.2 mg/dL 0.8 0.4 0.4  Alkaline Phos 38 - 126 U/L 77 74 81  AST 15 - 41 U/L 19 23 23   ALT 0 - 44 U/L 17 16 17        DIAGNOSTIC IMAGING:  I have reviewed scans.   I have reviewed Venita Lick LPN's note and agree  with the documentation.  I personally performed a face-to-face visit, made revisions and my assessment and plan is as follows.    ASSESSMENT & PLAN:   Prostate cancer metastatic to bone ([place]) 1.  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 -Germline mutation testing negative.  Guardant 360 -. -8 cycles of docetaxel from 09/18/2019 through 02/06/2020. -CTAP on 12/01/2019 showed mild enlargement of left iliac adenopathy measuring 6.4 x 4 cm, previously 5.6 x 3.7 cm.  This  was compared to CT scan from September 2020. -His PSA is continuing to trend down.  Last PSA was 5.9 on 02/06/2020, down from 6.89. -I have reviewed his labs.  White count and platelets are adequate to proceed with next cycle.  He does not report any major toxicities.  We will reassess him in 3 weeks.  2.  Bone metastasis: -He will continue monthly denosumab.  Calcium today is 9.7.  3.  CKD: -Baseline creatinine between 1.5-2.0.  Today creatinine is 1.68.  4.  Normocytic anemia: -Combination of CKD, iron deficiency and bone marrow suppression. -Last Feraheme on 01/16/2020. -Hemoglobin today is 10.    Orders placed this encounter:  No orders of the defined types were placed in this encounter.    Derek Jack, MD [place] [phone]

## 2020-02-27 NOTE — Progress Notes (Signed)
Patient has been assessed, vital signs and labs have been reviewed by Dr. Katragadda. ANC, Creatinine, LFTs, and Platelets are within treatment parameters per Dr. Katragadda. The patient is good to proceed with treatment at this time.  

## 2020-02-27 NOTE — Patient Instructions (Signed)
[facility] Discharge Instructions for Patients Receiving Chemotherapy   Beginning January 23rd 2017 lab work for the [facility] will be done in the  Main lab at [facility] on 1st floor. If you have a lab appointment with the [place] please come in thru the  Main Entrance and check in at the main information desk   Today you received the following chemotherapy agents Docetaxel and Xgeva  To help prevent nausea and vomiting after your treatment, we encourage you to take your nausea medication If you develop nausea and vomiting, or diarrhea that is not controlled by your medication, call the clinic.  The clinic phone number is [phone]. Office hours are Monday-Friday 8:30am-5:00pm.  BELOW ARE SYMPTOMS THAT SHOULD BE REPORTED IMMEDIATELY:  *FEVER GREATER THAN 101.0 F  *CHILLS WITH OR WITHOUT FEVER  NAUSEA AND VOMITING THAT IS NOT CONTROLLED WITH YOUR NAUSEA MEDICATION  *UNUSUAL SHORTNESS OF BREATH  *UNUSUAL BRUISING OR BLEEDING  TENDERNESS IN MOUTH AND THROAT WITH OR WITHOUT PRESENCE OF ULCERS  *URINARY PROBLEMS  *BOWEL PROBLEMS  UNUSUAL RASH Items with * indicate a potential emergency and should be followed up as soon as possible. If you have an emergency after office hours please contact your primary care physician or go to the nearest emergency department.  Please call the clinic during office hours if you have any questions or concerns.   You may also contact the Patient Navigator at [phone] should you have any questions or need assistance in obtaining follow up care.      Resources For Cancer Patients and their Caregivers ? American Cancer Society: Can assist with transportation, wigs, general needs, runs Look Good Feel Better.        9852119014 ? Cancer Care: Provides financial assistance, online support groups, medication/co-pay assistance.  1-800-813-HOPE 325-307-4837) ? [place] Assists [place] cancer patients and their families through emotional , educational and financial support.  (423)587-3469 ? [place] DSS Where to apply for food stamps, Medicaid and utility assistance. (323)554-1922 ? [facility]: Transportation to medical appointments. 531-880-6762 ? Social Security Administration: May apply for disability if have a Stage IV cancer. 386-124-5028 956-259-1840 ? [employer], Disability and Transit Services: Assists with nutrition, care and transit needs. 804-695-3972

## 2020-02-27 NOTE — Patient Instructions (Addendum)
[place] at [facility] Discharge Instructions  You were seen today by Dr. Delton Coombes. He went over your recent results. He will see you back in 3 weeks for labs, treatment and follow up.   Thank you for choosing [place] at [facility] to provide your oncology and hematology care.  To afford each patient quality time with our provider, please arrive at least 15 minutes before your scheduled appointment time.   If you have a lab appointment with the [place] please come in thru the  Main Entrance and check in at the main information desk  You need to re-schedule your appointment should you arrive 10 or more minutes late.  We strive to give you quality time with our providers, and arriving late affects you and other patients whose appointments are after yours.  Also, if you no show three or more times for appointments you may be dismissed from the clinic at the providers discretion.     Again, thank you for choosing [facility].  Our hope is that these requests will decrease the amount of time that you wait before being seen by our physicians.       _____________________________________________________________  Should you have questions after your visit to [facility], please contact our office at [phone] between the hours of 8:00 a.m. and 4:30 p.m.  Voicemails left after 4:00 p.m. will not be returned until the following business day.  For prescription refill requests, have your pharmacy contact our office and allow 72 hours.    Cancer Center Support Programs:   > Cancer Support Group  2nd Tuesday of the month 1pm-2pm, Journey Room

## 2020-02-27 NOTE — Assessment & Plan Note (Signed)
1.  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 -Germline mutation testing negative.  Guardant 360 -. -8 cycles of docetaxel from 09/18/2019 through 02/06/2020. -CTAP on 12/01/2019 showed mild enlargement of left iliac adenopathy measuring 6.4 x 4 cm, previously 5.6 x 3.7 cm.  This was compared to CT scan from September 2020. -His PSA is continuing to trend down.  Last PSA was 5.9 on 02/06/2020, down from 6.89. -I have reviewed his labs.  White count and platelets are adequate to proceed with next cycle.  He does not report any major toxicities.  We will reassess him in 3 weeks.  2.  Bone metastasis: -He will continue monthly denosumab.  Calcium today is 9.7.  3.  CKD: -Baseline creatinine between 1.5-2.0.  Today creatinine is 1.68.  4.  Normocytic anemia: -Combination of CKD, iron deficiency and bone marrow suppression. -Last Feraheme on 01/16/2020. -Hemoglobin today is 10.

## 2020-02-29 ENCOUNTER — Inpatient Hospital Stay (HOSPITAL_COMMUNITY): Payer: Medicare Other

## 2020-02-29 ENCOUNTER — Other Ambulatory Visit: Payer: Self-pay

## 2020-02-29 ENCOUNTER — Encounter (HOSPITAL_COMMUNITY): Payer: Self-pay

## 2020-02-29 VITALS — BP 110/53 | HR 97 | Temp 97.1°F | Resp 20

## 2020-02-29 DIAGNOSIS — Z5111 Encounter for antineoplastic chemotherapy: Secondary | ICD-10-CM | POA: Diagnosis not present

## 2020-02-29 DIAGNOSIS — Z87891 Personal history of nicotine dependence: Secondary | ICD-10-CM | POA: Diagnosis not present

## 2020-02-29 DIAGNOSIS — C7951 Secondary malignant neoplasm of bone: Secondary | ICD-10-CM

## 2020-02-29 DIAGNOSIS — C779 Secondary and unspecified malignant neoplasm of lymph node, unspecified: Secondary | ICD-10-CM | POA: Diagnosis not present

## 2020-02-29 DIAGNOSIS — N189 Chronic kidney disease, unspecified: Secondary | ICD-10-CM | POA: Diagnosis not present

## 2020-02-29 DIAGNOSIS — Z5189 Encounter for other specified aftercare: Secondary | ICD-10-CM | POA: Diagnosis not present

## 2020-02-29 DIAGNOSIS — E611 Iron deficiency: Secondary | ICD-10-CM | POA: Diagnosis not present

## 2020-02-29 DIAGNOSIS — Z95828 Presence of other vascular implants and grafts: Secondary | ICD-10-CM

## 2020-02-29 DIAGNOSIS — D6489 Other specified anemias: Secondary | ICD-10-CM | POA: Diagnosis not present

## 2020-02-29 MED ORDER — PEGFILGRASTIM INJECTION 6 MG/0.6ML ~~LOC~~
PREFILLED_SYRINGE | SUBCUTANEOUS | Status: AC
  Filled 2020-02-29: qty 0.6

## 2020-02-29 MED ORDER — PEGFILGRASTIM INJECTION 6 MG/0.6ML ~~LOC~~
6.0000 mg | PREFILLED_SYRINGE | Freq: Once | SUBCUTANEOUS | Status: AC
  Administered 2020-02-29: 6 mg via SUBCUTANEOUS

## 2020-02-29 NOTE — Patient Instructions (Signed)
[place] at [facility] Discharge Instructions  Received Neulasta injection today. Follow-up as scheduled. Call clinic for any questions or concerns   Thank you for choosing [place] at [facility] to provide your oncology and hematology care.  To afford each patient quality time with our provider, please arrive at least 15 minutes before your scheduled appointment time.   If you have a lab appointment with the [place] please come in thru the Main Entrance and check in at the main information desk.  You need to re-schedule your appointment should you arrive 10 or more minutes late.  We strive to give you quality time with our providers, and arriving late affects you and other patients whose appointments are after yours.  Also, if you no show three or more times for appointments you may be dismissed from the clinic at the providers discretion.     Again, thank you for choosing [facility].  Our hope is that these requests will decrease the amount of time that you wait before being seen by our physicians.       _____________________________________________________________  Should you have questions after your visit to [facility], please contact our office at [phone] between the hours of 8:00 a.m. and 4:30 p.m.  Voicemails left after 4:00 p.m. will not be returned until the following business day.  For prescription refill requests, have your pharmacy contact our office and allow 72 hours.    Due to Covid, you will need to wear a mask upon entering the hospital. If you do not have a mask, a mask will be given to you at the Main Entrance upon arrival. For doctor visits, patients may have 1 support person with them. For treatment visits, patients can not have anyone with them due to social distancing guidelines and our immunocompromised population.

## 2020-02-29 NOTE — Progress Notes (Signed)
Moishe Spice tolerated Neulasta injection well without complaints or incident. VSS Pt discharged self ambulatory in satisfactory condition

## 2020-03-08 DIAGNOSIS — M79606 Pain in leg, unspecified: Secondary | ICD-10-CM | POA: Diagnosis not present

## 2020-03-08 DIAGNOSIS — C7951 Secondary malignant neoplasm of bone: Secondary | ICD-10-CM | POA: Diagnosis not present

## 2020-03-19 ENCOUNTER — Other Ambulatory Visit: Payer: Self-pay

## 2020-03-19 ENCOUNTER — Inpatient Hospital Stay (HOSPITAL_COMMUNITY): Payer: Medicare Other

## 2020-03-19 ENCOUNTER — Inpatient Hospital Stay (HOSPITAL_BASED_OUTPATIENT_CLINIC_OR_DEPARTMENT_OTHER): Payer: Medicare Other | Admitting: Hematology

## 2020-03-19 VITALS — BP 132/88 | HR 86 | Resp 16

## 2020-03-19 VITALS — BP 131/62 | HR 95 | Temp 97.1°F | Resp 18 | Wt 255.0 lb

## 2020-03-19 DIAGNOSIS — C61 Malignant neoplasm of prostate: Secondary | ICD-10-CM

## 2020-03-19 DIAGNOSIS — D6489 Other specified anemias: Secondary | ICD-10-CM | POA: Diagnosis not present

## 2020-03-19 DIAGNOSIS — C7951 Secondary malignant neoplasm of bone: Secondary | ICD-10-CM

## 2020-03-19 DIAGNOSIS — E611 Iron deficiency: Secondary | ICD-10-CM | POA: Diagnosis not present

## 2020-03-19 DIAGNOSIS — N189 Chronic kidney disease, unspecified: Secondary | ICD-10-CM | POA: Diagnosis not present

## 2020-03-19 DIAGNOSIS — C779 Secondary and unspecified malignant neoplasm of lymph node, unspecified: Secondary | ICD-10-CM | POA: Diagnosis not present

## 2020-03-19 DIAGNOSIS — Z95828 Presence of other vascular implants and grafts: Secondary | ICD-10-CM

## 2020-03-19 DIAGNOSIS — Z87891 Personal history of nicotine dependence: Secondary | ICD-10-CM | POA: Diagnosis not present

## 2020-03-19 DIAGNOSIS — Z5189 Encounter for other specified aftercare: Secondary | ICD-10-CM | POA: Diagnosis not present

## 2020-03-19 DIAGNOSIS — Z5111 Encounter for antineoplastic chemotherapy: Secondary | ICD-10-CM | POA: Diagnosis not present

## 2020-03-19 LAB — CBC WITH DIFFERENTIAL/PLATELET
Abs Immature Granulocytes: 0.04 10*3/uL (ref 0.00–0.07)
Basophils Absolute: 0.1 10*3/uL (ref 0.0–0.1)
Basophils Relative: 1 %
Eosinophils Absolute: 0.5 10*3/uL (ref 0.0–0.5)
Eosinophils Relative: 5 %
HCT: 32.9 % — ABNORMAL LOW (ref 39.0–52.0)
Hemoglobin: 10.2 g/dL — ABNORMAL LOW (ref 13.0–17.0)
Immature Granulocytes: 1 %
Lymphocytes Relative: 17 %
Lymphs Abs: 1.5 10*3/uL (ref 0.7–4.0)
MCH: 28.5 pg (ref 26.0–34.0)
MCHC: 31 g/dL (ref 30.0–36.0)
MCV: 91.9 fL (ref 80.0–100.0)
Monocytes Absolute: 0.5 10*3/uL (ref 0.1–1.0)
Monocytes Relative: 6 %
Neutro Abs: 5.9 10*3/uL (ref 1.7–7.7)
Neutrophils Relative %: 70 %
Platelets: 200 10*3/uL (ref 150–400)
RBC: 3.58 MIL/uL — ABNORMAL LOW (ref 4.22–5.81)
RDW: 17 % — ABNORMAL HIGH (ref 11.5–15.5)
WBC: 8.4 10*3/uL (ref 4.0–10.5)
nRBC: 0 % (ref 0.0–0.2)

## 2020-03-19 LAB — COMPREHENSIVE METABOLIC PANEL
ALT: 17 U/L (ref 0–44)
AST: 23 U/L (ref 15–41)
Albumin: 3.8 g/dL (ref 3.5–5.0)
Alkaline Phosphatase: 77 U/L (ref 38–126)
Anion gap: 10 (ref 5–15)
BUN: 16 mg/dL (ref 8–23)
CO2: 22 mmol/L (ref 22–32)
Calcium: 8.7 mg/dL — ABNORMAL LOW (ref 8.9–10.3)
Chloride: 102 mmol/L (ref 98–111)
Creatinine, Ser: 1.6 mg/dL — ABNORMAL HIGH (ref 0.61–1.24)
GFR calc Af Amer: 46 mL/min — ABNORMAL LOW (ref >60)
GFR calc non Af Amer: 40 mL/min — ABNORMAL LOW (ref >60)
Glucose, Bld: 124 mg/dL — ABNORMAL HIGH (ref 70–99)
Potassium: 3.8 mmol/L (ref 3.5–5.1)
Sodium: 134 mmol/L — ABNORMAL LOW (ref 135–145)
Total Bilirubin: 0.7 mg/dL (ref 0.3–1.2)
Total Protein: 7.1 g/dL (ref 6.5–8.1)

## 2020-03-19 LAB — MAGNESIUM: Magnesium: 2.2 mg/dL (ref 1.7–2.4)

## 2020-03-19 LAB — PSA: Prostatic Specific Antigen: 7.05 ng/mL — ABNORMAL HIGH (ref 0.00–4.00)

## 2020-03-19 MED ORDER — DIPHENHYDRAMINE HCL 50 MG/ML IJ SOLN
25.0000 mg | Freq: Once | INTRAMUSCULAR | Status: AC
  Administered 2020-03-19: 25 mg via INTRAVENOUS
  Filled 2020-03-19: qty 1

## 2020-03-19 MED ORDER — FAMOTIDINE IN NACL 20-0.9 MG/50ML-% IV SOLN
20.0000 mg | Freq: Once | INTRAVENOUS | Status: AC
  Administered 2020-03-19: 20 mg via INTRAVENOUS
  Filled 2020-03-19: qty 50

## 2020-03-19 MED ORDER — SODIUM CHLORIDE 0.9% FLUSH
10.0000 mL | INTRAVENOUS | Status: DC | PRN
  Administered 2020-03-19: 10 mL

## 2020-03-19 MED ORDER — FUROSEMIDE 40 MG PO TABS: ORAL_TABLET | ORAL | 5 refills | Status: AC

## 2020-03-19 MED ORDER — SODIUM CHLORIDE 0.9 % IV SOLN: INTRAVENOUS | Status: DC

## 2020-03-19 MED ORDER — HEPARIN SOD (PORK) LOCK FLUSH 100 UNIT/ML IV SOLN
500.0000 [IU] | Freq: Once | INTRAVENOUS | Status: AC | PRN
  Administered 2020-03-19: 500 [IU]

## 2020-03-19 MED ORDER — SODIUM CHLORIDE 0.9 % IV SOLN: Freq: Once | INTRAVENOUS | Status: AC

## 2020-03-19 MED ORDER — SODIUM CHLORIDE 0.9 % IV SOLN
Freq: Once | INTRAVENOUS | Status: AC
  Filled 2020-03-19: qty 4

## 2020-03-19 MED ORDER — SODIUM CHLORIDE 0.9 % IV SOLN
50.0000 mg/m2 | Freq: Once | INTRAVENOUS | Status: AC
  Administered 2020-03-19: 120 mg via INTRAVENOUS
  Filled 2020-03-19: qty 12

## 2020-03-19 NOTE — Progress Notes (Signed)
Labs reviewed with MD today at office visit today. Will proceed with treatment today per MD.   Treatment given per orders. Patient tolerated it well without problems. Vitals stable and discharged home from clinic via wheelchair.. Follow up as scheduled.

## 2020-03-19 NOTE — Patient Instructions (Addendum)
[place] at [facility] Discharge Instructions  You were seen today by Dr. Delton Coombes. He went over your recent lab results. All of your blood work looks good. Try taking a spoonful of mustard for the cramps in your hands. Try taking Tylenol for the pain in your hips. He will see you back in 3 weeks for labs, treatment and follow up.   Thank you for choosing [place] at [facility] to provide your oncology and hematology care.  To afford each patient quality time with our provider, please arrive at least 15 minutes before your scheduled appointment time.   If you have a lab appointment with the [place] please come in thru the  Main Entrance and check in at the main information desk  You need to re-schedule your appointment should you arrive 10 or more minutes late.  We strive to give you quality time with our providers, and arriving late affects you and other patients whose appointments are after yours.  Also, if you no show three or more times for appointments you may be dismissed from the clinic at the providers discretion.     Again, thank you for choosing [facility].  Our hope is that these requests will decrease the amount of time that you wait before being seen by our physicians.       _____________________________________________________________  Should you have questions after your visit to [facility], please contact our office at [phone] between the hours of 8:00 a.m. and 4:30 p.m.  Voicemails left after 4:00 p.m. will not be returned until the following business day.  For prescription refill requests, have your pharmacy contact our office and allow 72 hours.    Cancer Center Support Programs:   > Cancer Support Group  2nd Tuesday of the month 1pm-2pm, Journey Room

## 2020-03-19 NOTE — Progress Notes (Signed)
Patient has been assessed, vital signs and labs have been reviewed by Dr. Katragadda. ANC, Creatinine, LFTs, and Platelets are within treatment parameters per Dr. Katragadda. The patient is good to proceed with treatment at this time.  

## 2020-03-19 NOTE — Progress Notes (Signed)
[place], [place] [address]   CLINIC:  Medical Oncology/Hematology  PCP:  Celene Squibb, MD [address] [place] [address] [phone]   REASON FOR VISIT:  Follow-up for metastatic prostate cancer    INTERVAL HISTORY:  [name] 79 y.o. 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  Reports appetite 100%.  Energy levels are 75%.  Reports mild leg swelling.  Denies any major tingling or numbness in extremities.  Denies any GI side effects.  REVIEW OF SYSTEMS:  Review of Systems  Cardiovascular: Positive for leg swelling.  All other systems reviewed and are negative.    PAST MEDICAL/SURGICAL HISTORY:  Past Medical History:  Diagnosis Date  . Cancer [facility])    prostate  . Family history of breast cancer   . Family history of prostate cancer   . GERD (gastroesophageal reflux disease)   . HOH (hard of hearing)   . Hyperlipidemia   . Hypertension   . Port-A-Cath in place 08/29/2019  . Prostate cancer metastatic to bone ([place]) 04/04/2016  . Sleep apnea 10/24/2016   Past Surgical History:  Procedure Laterality Date  . CATARACT EXTRACTION W/PHACO Right 04/22/2019   Procedure: CATARACT EXTRACTION PHACO AND INTRAOCULAR LENS PLACEMENT RIGHT EYE;  Surgeon: Baruch Goldmann, MD;  Location: [facility];  Service: Ophthalmology;  Laterality: Right;  right  . CATARACT EXTRACTION W/PHACO Left 05/13/2019   Procedure: CATARACT EXTRACTION PHACO AND INTRAOCULAR LENS PLACEMENT LEFT EYE;  Surgeon: Baruch Goldmann, MD;  Location: [facility];  Service: Ophthalmology;  Laterality: Left;  left  . CHOLECYSTECTOMY    . CYSTOSCOPY W/ URETERAL STENT PLACEMENT Left 08/17/2019   Procedure: CYSTOSCOPY WITH RETROGRADE PYELOGRAM/URETERAL STENT PLACEMENT;  Surgeon: Cleon Gustin, MD;  Location: [facility];  Service: Urology;  Laterality: Left;  . HAND SURGERY    . INGUINAL HERNIA REPAIR  Left   . PORTACATH PLACEMENT Left 09/07/2019   Procedure: INSERTION PORT-A-CATH;  Surgeon: Virl Cagey, MD;  Location: [facility];  Service: General;  Laterality: Left;  . PROSTATECTOMY    . PROSTATECTOMY    . radical prostatectomy    . TONSILLECTOMY       SOCIAL HISTORY:  Social History   Socioeconomic History  . Marital status: Widowed    Spouse name: Not on file  . Number of children: 3  . Years of education: 9  . Highest education level: Not on file  Occupational History  . Occupation: Animator- retired    Comment: [employer]  Tobacco Use  . Smoking status: Former Smoker    Packs/day: 1.00    Years: 32.00    Pack years: 32.00    Types: Cigarettes    Quit date: 02/04/1989    Years since quitting: 31.1  . Smokeless tobacco: Never Used  Substance and Sexual Activity  . Alcohol use: No  . Drug use: No  . Sexual activity: Not Currently  Other Topics Concern  . Not on file  Social History Narrative   Widowed   Loves with son   Chronic low back pain   Social Determinants of Health   Financial Resource Strain:   . Difficulty of Paying Living Expenses:   Food Insecurity:   . Worried About Charity fundraiser in the Last Year:   . Arboriculturist in the Last Year:   Transportation Needs:   .  Lack of Transportation (Medical):   Marland Kitchen Lack of Transportation (Non-Medical):   Physical Activity:   . Days of Exercise per Week:   . Minutes of Exercise per Session:   Stress:   . Feeling of Stress :   Social Connections:   . Frequency of Communication with Friends and Family:   . Frequency of Social Gatherings with Friends and Family:   . Attends Religious Services:   . Active Member of Clubs or Organizations:   . Attends Archivist Meetings:   Marland Kitchen Marital Status:   Intimate Partner Violence:   . Fear of Current or Ex-Partner:   . Emotionally Abused:   Marland Kitchen Physically Abused:   . Sexually Abused:     FAMILY HISTORY:  Family History  Problem Relation  Age of Onset  . Heart failure Mother        CHF  . Cancer Mother        unknown  . Prostate cancer Father   . Narcolepsy Sister   . Cancer Daughter        unknown  . Breast cancer Maternal Aunt   . Cancer Maternal Grandmother        unknown  . Cancer Maternal Aunt        unknown type of cancer    CURRENT MEDICATIONS:  Outpatient Encounter Medications as of 03/19/2020  Medication Sig  . benazepril (LOTENSIN) 20 MG tablet TAKE ONE TABLET BY MOUTH DAILY.  . calcium-vitamin D (OSCAL WITH D) 500-200 MG-UNIT TABS tablet Take 1 tablet by mouth daily.  . cetirizine (ZYRTEC) 10 MG tablet TAKE ONE TABLET BY MOUTH DAILY.  Marland Kitchen Cholecalciferol (VITAMIN D3) 25 MCG (1000 UT) CHEW Chew by mouth daily.  . DOCETAXEL IV Inject into the vein every 21 ( twenty-one) days.  . furosemide (LASIX) 40 MG tablet TAKE ONE TABLET BY MOUTH DAILY. TAKE WITH POTASSIUM.  Marland Kitchen losartan (COZAAR) 100 MG tablet Take 100 mg by mouth daily.  . methylphenidate (RITALIN) 20 MG tablet Take 20 mg by mouth daily.  . mirabegron ER (MYRBETRIQ) 25 MG TB24 tablet Take by mouth.  . potassium chloride (KLOR-CON) 20 MEQ packet Take by mouth.  . predniSONE (DELTASONE) 5 MG tablet Take 1 tablet (5 mg total) by mouth daily with breakfast.  . [DISCONTINUED] furosemide (LASIX) 40 MG tablet TAKE ONE TABLET BY MOUTH DAILY. TAKE WITH POTASSIUM.  Marland Kitchen lidocaine-prilocaine (EMLA) cream Apply small amount to port a cath site and cover with plastic wrap 1 hour prior to chemotherapy appointments (Patient not taking: Reported on 02/27/2020)  . prochlorperazine (COMPAZINE) 10 MG tablet Take 1 tablet (10 mg total) by mouth every 6 (six) hours as needed for nausea or vomiting. (Patient not taking: Reported on 03/19/2020)  . traMADol (ULTRAM) 50 MG tablet Take 1 tablet (50 mg total) by mouth every 6 (six) hours as needed. (Patient not taking: Reported on 02/27/2020)  . [DISCONTINUED] clotrimazole-betamethasone (LOTRISONE) cream Apply 1 application topically 2 (two)  times daily.  . [DISCONTINUED] Ferrous Sulfate (IRON) 325 (65 Fe) MG TABS Take 1 tablet by mouth daily.  . [DISCONTINUED] fluticasone (FLONASE) 50 MCG/ACT nasal spray USE ONE SPRAY IN EACH NOSTRIL ONCE DAILY. SHAKE GENTLY BEFORE USING.  . [DISCONTINUED] hydrochlorothiazide (HYDRODIURIL) 25 MG tablet TAKE ONE TABLET BY MOUTH DAILY.  . [DISCONTINUED] ILEVRO 0.3 % ophthalmic suspension INSTILL ONE DROP IN THE OPERATIVE EYE DAILY. START TWO DAYS BEFORE SURGERY AND CONTINUE AS DIRECTED.  . [DISCONTINUED] ketoconazole (NIZORAL) 2 % cream APPLY A SMALL AMOUNT TO  FACE RASH TWICE DAILY FOR UP TO FOUR WEEKS IF NEEDED.  . [DISCONTINUED] ketorolac (ACULAR) 0.5 % ophthalmic solution INSTILL ONE DROP IN THE OPERATIVE EYE TWICE DAILY.  . [DISCONTINUED] meloxicam (MOBIC) 7.5 MG tablet Take 1 tablet (7.5 mg total) daily by mouth.  . [DISCONTINUED] moxifloxacin (VIGAMOX) 0.5 % ophthalmic solution INSTILL ONE DROP THREE TIMES DAILY IN THE OPERATIVE EYE. START TWO DAYS BEFORE SURGERY AND CONTINUE AS DIRECTED.  . [DISCONTINUED] prednisoLONE acetate (PRED FORTE) 1 % ophthalmic suspension INSTILL ONE DROP IN THE OPERATIVE EYE THREE TIMES DAILY. START ONE DAY AFTER SURGERY AND CONTINUE AS DIRECTED.  . [DISCONTINUED] selenium sulfide (SELSUN) 2.5 % shampoo Apply 1 application topically daily as needed for irritation.   No facility-administered encounter medications on file as of 03/19/2020.    ALLERGIES:  No Known Allergies   PHYSICAL EXAM:  ECOG Performance status: 1  Vitals:   03/19/20 1013  BP: 131/62  Pulse: 95  Resp: 18  Temp: (!) 97.1 F (36.2 C)  SpO2: 100%   Filed Weights   03/19/20 1013  Weight: 255 lb (115.7 kg)    Physical Exam Vitals reviewed.  Constitutional:      Appearance: Normal appearance.  Cardiovascular:     Rate and Rhythm: Normal rate and regular rhythm.     Heart sounds: Normal heart sounds.  Pulmonary:     Effort: Pulmonary effort is normal.     Breath sounds: Normal breath  sounds.  Abdominal:     General: There is no distension.     Palpations: Abdomen is soft. There is no mass.  Musculoskeletal:        General: No swelling.     Right lower leg: Edema present.     Left lower leg: Edema present.  Skin:    General: Skin is warm.  Neurological:     General: No focal deficit present.     Mental Status: He is alert and oriented to person, place, and time.  Psychiatric:        Mood and Affect: Mood normal.        Behavior: Behavior normal.      LABORATORY DATA:  I have reviewed the labs as listed.  CBC    Component Value Date/Time   WBC 8.4 03/19/2020 0916   RBC 3.58 (L) 03/19/2020 0916   HGB 10.2 (L) 03/19/2020 0916   HCT 32.9 (L) 03/19/2020 0916   PLT 200 03/19/2020 0916   MCV 91.9 03/19/2020 0916   MCH 28.5 03/19/2020 0916   MCHC 31.0 03/19/2020 0916   RDW 17.0 (H) 03/19/2020 0916   LYMPHSABS 1.5 03/19/2020 0916   MONOABS 0.5 03/19/2020 0916   EOSABS 0.5 03/19/2020 0916   BASOSABS 0.1 03/19/2020 0916   CMP Latest Ref Rng & Units 03/19/2020 02/27/2020 02/06/2020  Glucose 70 - 99 mg/dL 124(H) 95 113(H)  BUN 8 - 23 mg/dL 16 21 15   Creatinine 0.61 - 1.24 mg/dL 1.60(H) 1.68(H) 1.64(H)  Sodium 135 - 145 mmol/L 134(L) 135 140  Potassium 3.5 - 5.1 mmol/L 3.8 4.0 3.6  Chloride 98 - 111 mmol/L 102 101 104  CO2 22 - 32 mmol/L 22 23 24   Calcium 8.9 - 10.3 mg/dL 8.7(L) 9.7 9.6  Total Protein 6.5 - 8.1 g/dL 7.1 7.4 7.0  Total Bilirubin 0.3 - 1.2 mg/dL 0.7 0.8 0.4  Alkaline Phos 38 - 126 U/L 77 77 74  AST 15 - 41 U/L 23 19 23   ALT 0 - 44 U/L 17 17 16  DIAGNOSTIC IMAGING:  I have reviewed scans.   I have reviewed Venita Lick LPN's note and agree with the documentation.  I personally performed a face-to-face visit, made revisions and my assessment and plan is as follows.    ASSESSMENT:  1.  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 -Germline mutation testing  negative.  Guardant 360 was negative for any actionable mutations. -9 cycles of docetaxel from 09/16/2019 through 02/27/2020. -CTAP on 12/01/2019 showed mild enlargement of the left iliac adenopathy measuring 6.4 x 4 cm, previously 5.6 x 3.77.  This was compared to CT scan from September 2020. -His PSA continuing to trend down.  His last PSA was 5.52 on 02/27/2020.  PLAN:  1.  Metastatic CRPC to lymph nodes and bones: -I reviewed his labs today which showed normal white count and platelet count.  LFTs are normal. -He will proceed with cycle 10 of chemotherapy with dose reduction of docetaxel at 50 mg per metered squared. -We will reevaluate him in 3 weeks.  We will follow up on the PSA from today.  2.  Bone metastasis: -Continue monthly denosumab.  Calcium is normal.  3.  CKD: -Baseline creatinine is 1.5-2.0.  Today creatinine is 1.6.  4.  Normocytic anemia: -Hemoglobin is 10.2.  MCV is 91.9. -Ferritin on 02/27/2020 was 510 with percent saturation of 12.  123456 and folic acid was normal.     Orders placed this encounter:  No orders of the defined types were placed in this encounter.    Derek Jack, MD [place] [phone]

## 2020-03-19 NOTE — Patient Instructions (Signed)
[facility] Discharge Instructions for Patients Receiving Chemotherapy  Today you received the following chemotherapy agents   To help prevent nausea and vomiting after your treatment, we encourage you to take your nausea medication   If you develop nausea and vomiting that is not controlled by your nausea medication, call the clinic.   BELOW ARE SYMPTOMS THAT SHOULD BE REPORTED IMMEDIATELY:  *FEVER GREATER THAN 100.5 F  *CHILLS WITH OR WITHOUT FEVER  NAUSEA AND VOMITING THAT IS NOT CONTROLLED WITH YOUR NAUSEA MEDICATION  *UNUSUAL SHORTNESS OF BREATH  *UNUSUAL BRUISING OR BLEEDING  TENDERNESS IN MOUTH AND THROAT WITH OR WITHOUT PRESENCE OF ULCERS  *URINARY PROBLEMS  *BOWEL PROBLEMS  UNUSUAL RASH Items with * indicate a potential emergency and should be followed up as soon as possible.  Feel free to call the clinic should you have any questions or concerns. The clinic phone number is [phone].  Please show the CHEMO ALERT CARD at check-in to the Emergency Department and triage nurse.   

## 2020-03-19 NOTE — Patient Instructions (Signed)
[facility] at [facility] Discharge Instructions  Labs drawn from portacath today   Thank you for choosing [facility] at [facility] to provide your oncology and hematology care.  To afford each patient quality time with our provider, please arrive at least 15 minutes before your scheduled appointment time.   If you have a lab appointment with the Cancer Center please come in thru the Main Entrance and check in at the main information desk.  You need to re-schedule your appointment should you arrive 10 or more minutes late.  We strive to give you quality time with our providers, and arriving late affects you and other patients whose appointments are after yours.  Also, if you no show three or more times for appointments you may be dismissed from the clinic at the providers discretion.     Again, thank you for choosing [facility].  Our hope is that these requests will decrease the amount of time that you wait before being seen by our physicians.       _____________________________________________________________  Should you have questions after your visit to  Cancer Center, please contact our office at [phone] between the hours of 8:00 a.m. and 4:30 p.m.  Voicemails left after 4:00 p.m. will not be returned until the following business day.  For prescription refill requests, have your pharmacy contact our office and allow 72 hours.    Due to Covid, you will need to wear a mask upon entering the hospital. If you do not have a mask, a mask will be given to you at the Main Entrance upon arrival. For doctor visits, patients may have 1 support person with them. For treatment visits, patients can not have anyone with them due to social distancing guidelines and our immunocompromised population.     

## 2020-03-21 ENCOUNTER — Inpatient Hospital Stay (HOSPITAL_COMMUNITY): Payer: Medicare Other

## 2020-03-21 ENCOUNTER — Other Ambulatory Visit: Payer: Self-pay

## 2020-03-21 VITALS — BP 113/57 | HR 99 | Temp 97.3°F | Resp 18

## 2020-03-21 DIAGNOSIS — D6489 Other specified anemias: Secondary | ICD-10-CM | POA: Diagnosis not present

## 2020-03-21 DIAGNOSIS — C779 Secondary and unspecified malignant neoplasm of lymph node, unspecified: Secondary | ICD-10-CM | POA: Diagnosis not present

## 2020-03-21 DIAGNOSIS — Z5189 Encounter for other specified aftercare: Secondary | ICD-10-CM | POA: Diagnosis not present

## 2020-03-21 DIAGNOSIS — Z5111 Encounter for antineoplastic chemotherapy: Secondary | ICD-10-CM | POA: Diagnosis not present

## 2020-03-21 DIAGNOSIS — C7951 Secondary malignant neoplasm of bone: Secondary | ICD-10-CM | POA: Diagnosis not present

## 2020-03-21 DIAGNOSIS — Z87891 Personal history of nicotine dependence: Secondary | ICD-10-CM | POA: Diagnosis not present

## 2020-03-21 DIAGNOSIS — E611 Iron deficiency: Secondary | ICD-10-CM | POA: Diagnosis not present

## 2020-03-21 DIAGNOSIS — Z95828 Presence of other vascular implants and grafts: Secondary | ICD-10-CM

## 2020-03-21 DIAGNOSIS — N189 Chronic kidney disease, unspecified: Secondary | ICD-10-CM | POA: Diagnosis not present

## 2020-03-21 MED ORDER — PEGFILGRASTIM INJECTION 6 MG/0.6ML ~~LOC~~
6.0000 mg | PREFILLED_SYRINGE | Freq: Once | SUBCUTANEOUS | Status: AC
  Administered 2020-03-21: 6 mg via SUBCUTANEOUS

## 2020-03-21 NOTE — Progress Notes (Signed)
[name] presents today for Neulasta injection. Pt showed up early, as his appointment was scheduled for 1pm. He had treatment on Monday. Per [name], RPH, ok to proceed with giving injection this morning. Injection tolerated without incident or complaint. VSS. Discharged in satisfactory condition with follow up instructions.

## 2020-03-21 NOTE — Patient Instructions (Signed)
[place] at [facility]  Discharge Instructions:  Neulasta injection received today. _______________________________________________________________  Thank you for choosing [place] at [facility] to provide your oncology and hematology care.  To afford each patient quality time with our providers, please arrive at least 15 minutes before your scheduled appointment.  You need to re-schedule your appointment if you arrive 10 or more minutes late.  We strive to give you quality time with our providers, and arriving late affects you and other patients whose appointments are after yours.  Also, if you no show three or more times for appointments you may be dismissed from the clinic.  Again, thank you for choosing [place] [facility] hope is that these requests will allow you access to exceptional care and in a timely manner. _______________________________________________________________  If you have questions after your visit, please contact our office at [phone] between the hours of 8:30 a.m. and 5:00 p.m. Voicemails left after 4:30 p.m. will not be returned until the following business day. _______________________________________________________________  For prescription refill requests, have your pharmacy contact our office. _______________________________________________________________  Recommendations made by the consultant and any test results will be sent to your referring physician. _______________________________________________________________

## 2020-04-09 ENCOUNTER — Ambulatory Visit (HOSPITAL_COMMUNITY): Payer: Medicare Other | Admitting: Hematology

## 2020-04-09 ENCOUNTER — Ambulatory Visit (HOSPITAL_COMMUNITY): Payer: Medicare Other

## 2020-04-09 ENCOUNTER — Other Ambulatory Visit (HOSPITAL_COMMUNITY): Payer: Medicare Other

## 2020-04-11 ENCOUNTER — Ambulatory Visit (HOSPITAL_COMMUNITY): Payer: Medicare Other

## 2020-04-12 ENCOUNTER — Inpatient Hospital Stay (HOSPITAL_COMMUNITY): Payer: Medicare Other

## 2020-04-12 ENCOUNTER — Inpatient Hospital Stay (HOSPITAL_BASED_OUTPATIENT_CLINIC_OR_DEPARTMENT_OTHER): Payer: Medicare Other | Admitting: Hematology

## 2020-04-12 ENCOUNTER — Other Ambulatory Visit: Payer: Self-pay

## 2020-04-12 ENCOUNTER — Inpatient Hospital Stay (HOSPITAL_COMMUNITY): Payer: Medicare Other | Attending: Hematology

## 2020-04-12 VITALS — BP 137/87 | HR 87 | Temp 97.5°F | Resp 18 | Wt 255.6 lb

## 2020-04-12 DIAGNOSIS — M25572 Pain in left ankle and joints of left foot: Secondary | ICD-10-CM | POA: Diagnosis not present

## 2020-04-12 DIAGNOSIS — Z95828 Presence of other vascular implants and grafts: Secondary | ICD-10-CM

## 2020-04-12 DIAGNOSIS — Z743 Need for continuous supervision: Secondary | ICD-10-CM | POA: Diagnosis not present

## 2020-04-12 DIAGNOSIS — I499 Cardiac arrhythmia, unspecified: Secondary | ICD-10-CM | POA: Diagnosis not present

## 2020-04-12 DIAGNOSIS — Z5111 Encounter for antineoplastic chemotherapy: Secondary | ICD-10-CM | POA: Diagnosis not present

## 2020-04-12 DIAGNOSIS — S7221XA Displaced subtrochanteric fracture of right femur, initial encounter for closed fracture: Secondary | ICD-10-CM | POA: Diagnosis not present

## 2020-04-12 DIAGNOSIS — C61 Malignant neoplasm of prostate: Secondary | ICD-10-CM

## 2020-04-12 DIAGNOSIS — R9082 White matter disease, unspecified: Secondary | ICD-10-CM | POA: Diagnosis not present

## 2020-04-12 DIAGNOSIS — C7951 Secondary malignant neoplasm of bone: Secondary | ICD-10-CM | POA: Diagnosis not present

## 2020-04-12 DIAGNOSIS — G319 Degenerative disease of nervous system, unspecified: Secondary | ICD-10-CM | POA: Diagnosis not present

## 2020-04-12 DIAGNOSIS — M25551 Pain in right hip: Secondary | ICD-10-CM | POA: Diagnosis not present

## 2020-04-12 LAB — COMPREHENSIVE METABOLIC PANEL
ALT: 17 U/L (ref 0–44)
AST: 22 U/L (ref 15–41)
Albumin: 3.7 g/dL (ref 3.5–5.0)
Alkaline Phosphatase: 76 U/L (ref 38–126)
Anion gap: 12 (ref 5–15)
BUN: 17 mg/dL (ref 8–23)
CO2: 26 mmol/L (ref 22–32)
Calcium: 9.1 mg/dL (ref 8.9–10.3)
Chloride: 101 mmol/L (ref 98–111)
Creatinine, Ser: 1.67 mg/dL — ABNORMAL HIGH (ref 0.61–1.24)
GFR calc Af Amer: 44 mL/min — ABNORMAL LOW (ref >60)
GFR calc non Af Amer: 38 mL/min — ABNORMAL LOW (ref >60)
Glucose, Bld: 91 mg/dL (ref 70–99)
Potassium: 3.9 mmol/L (ref 3.5–5.1)
Sodium: 139 mmol/L (ref 135–145)
Total Bilirubin: 0.7 mg/dL (ref 0.3–1.2)
Total Protein: 6.9 g/dL (ref 6.5–8.1)

## 2020-04-12 LAB — CBC WITH DIFFERENTIAL/PLATELET
Abs Immature Granulocytes: 0.03 10*3/uL (ref 0.00–0.07)
Basophils Absolute: 0.1 10*3/uL (ref 0.0–0.1)
Basophils Relative: 1 %
Eosinophils Absolute: 1.1 10*3/uL — ABNORMAL HIGH (ref 0.0–0.5)
Eosinophils Relative: 14 %
HCT: 31.7 % — ABNORMAL LOW (ref 39.0–52.0)
Hemoglobin: 9.9 g/dL — ABNORMAL LOW (ref 13.0–17.0)
Immature Granulocytes: 0 %
Lymphocytes Relative: 17 %
Lymphs Abs: 1.4 10*3/uL (ref 0.7–4.0)
MCH: 29 pg (ref 26.0–34.0)
MCHC: 31.2 g/dL (ref 30.0–36.0)
MCV: 93 fL (ref 80.0–100.0)
Monocytes Absolute: 0.5 10*3/uL (ref 0.1–1.0)
Monocytes Relative: 6 %
Neutro Abs: 5 10*3/uL (ref 1.7–7.7)
Neutrophils Relative %: 62 %
Platelets: 214 10*3/uL (ref 150–400)
RBC: 3.41 MIL/uL — ABNORMAL LOW (ref 4.22–5.81)
RDW: 16.9 % — ABNORMAL HIGH (ref 11.5–15.5)
WBC: 8.1 10*3/uL (ref 4.0–10.5)
nRBC: 0 % (ref 0.0–0.2)

## 2020-04-12 LAB — VITAMIN B12: Vitamin B-12: 1425 pg/mL — ABNORMAL HIGH (ref 180–914)

## 2020-04-12 LAB — IRON AND TIBC
Iron: 46 ug/dL (ref 45–182)
Saturation Ratios: 17 % — ABNORMAL LOW (ref 17.9–39.5)
TIBC: 273 ug/dL (ref 250–450)
UIBC: 227 ug/dL

## 2020-04-12 LAB — FOLATE: Folate: 28.5 ng/mL (ref >5.9)

## 2020-04-12 LAB — FERRITIN: Ferritin: 341 ng/mL — ABNORMAL HIGH (ref 24–336)

## 2020-04-12 MED ORDER — SODIUM CHLORIDE 0.9 % IV SOLN: Freq: Once | INTRAVENOUS | Status: AC

## 2020-04-12 MED ORDER — SODIUM CHLORIDE 0.9% FLUSH
10.0000 mL | INTRAVENOUS | Status: DC | PRN
  Administered 2020-04-12: 10 mL

## 2020-04-12 MED ORDER — SODIUM CHLORIDE 0.9 % IV SOLN
50.0000 mg/m2 | Freq: Once | INTRAVENOUS | Status: AC
  Administered 2020-04-12: 120 mg via INTRAVENOUS
  Filled 2020-04-12: qty 12

## 2020-04-12 MED ORDER — DENOSUMAB 120 MG/1.7ML ~~LOC~~ SOLN
120.0000 mg | Freq: Once | SUBCUTANEOUS | Status: AC
  Administered 2020-04-12: 120 mg via SUBCUTANEOUS
  Filled 2020-04-12: qty 1.7

## 2020-04-12 MED ORDER — SODIUM CHLORIDE 0.9 % IV SOLN
Freq: Once | INTRAVENOUS | Status: AC
  Filled 2020-04-12: qty 4

## 2020-04-12 MED ORDER — DIPHENHYDRAMINE HCL 50 MG/ML IJ SOLN
25.0000 mg | Freq: Once | INTRAMUSCULAR | Status: AC
  Administered 2020-04-12: 25 mg via INTRAVENOUS
  Filled 2020-04-12: qty 1

## 2020-04-12 MED ORDER — FAMOTIDINE IN NACL 20-0.9 MG/50ML-% IV SOLN
20.0000 mg | Freq: Once | INTRAVENOUS | Status: AC
  Administered 2020-04-12: 20 mg via INTRAVENOUS
  Filled 2020-04-12: qty 50

## 2020-04-12 MED ORDER — HEPARIN SOD (PORK) LOCK FLUSH 100 UNIT/ML IV SOLN
500.0000 [IU] | Freq: Once | INTRAVENOUS | Status: AC | PRN
  Administered 2020-04-12: 500 [IU]

## 2020-04-12 NOTE — Progress Notes (Signed)
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  NGS Results: Guardant 360 did not show any actionable mutations.  CURRENT THERAPY: Docetaxel  BRIEF ONCOLOGIC HISTORY:  Oncology History  Prostate cancer metastatic to bone ([place])  04/04/2016 Initial Diagnosis   Prostate cancer metastatic to bone ([place])   07/14/2019 Genetic Testing   Negative genetic testing on the common hereditary cancer panel.  The Common Hereditary Gene Panel offered by [facility] includes sequencing and/or deletion duplication testing of the following 48 genes: APC, ATM, AXIN2, BARD1, BMPR1A, BRCA1, BRCA2, BRIP1, CDH1, CDK4, CDKN2A (p14ARF), CDKN2A (p16INK4a), CHEK2, CTNNA1, DICER1, EPCAM (Deletion/duplication testing only), GREM1 (promoter region deletion/duplication testing only), KIT, MEN1, MLH1, MSH2, MSH3, MSH6, MUTYH, NBN, NF1, NHTL1, PALB2, PDGFRA, PMS2, POLD1, POLE, PTEN, RAD50, RAD51C, RAD51D, RNF43, SDHB, SDHC, SDHD, SMAD4, SMARCA4. STK11, TP53, TSC1, TSC2, and VHL.  The following genes were evaluated for sequence changes only: SDHA and HOXB13 c.251G>A variant only. The report date is July 14, 2019.   09/08/2019 -  Chemotherapy   The patient had pegfilgrastim (NEULASTA) injection 6 mg, 6 mg, Subcutaneous, Once, 10 of 12 cycles Administration: 6 mg (09/09/2019), 6 mg (10/04/2019), 6 mg (10/31/2019), 6 mg (11/16/2019), 6 mg (12/07/2019), 6 mg (12/28/2019), 6 mg (01/17/2020), 6 mg (02/08/2020), 6 mg (02/29/2020), 6 mg (03/21/2020) ondansetron (ZOFRAN) 8 mg in sodium chloride 0.9 % 50 mL IVPB, 8 mg (100 % of original dose 8 mg), Intravenous,  Once, 1 of 1 cycle Dose modification: 8 mg (original dose 8 mg, Cycle 1) Administration: 8 mg (09/08/2019) DOCEtaxel (TAXOTERE) 120 mg in sodium  chloride 0.9 % 250 mL chemo infusion, 50 mg/m2 = 120 mg (66.7 % of original dose 75 mg/m2), Intravenous,  Once, 10 of 12 cycles Dose modification: 50 mg/m2 (66.7 % of original dose 75 mg/m2, Cycle 1, Reason: Provider Judgment) Administration: 120 mg (09/08/2019), 120 mg (09/30/2019), 120 mg (10/24/2019), 120 mg (11/14/2019), 120 mg (12/05/2019), 120 mg (12/26/2019), 120 mg (01/16/2020), 120 mg (02/06/2020), 120 mg (02/27/2020), 120 mg (03/19/2020) ondansetron (ZOFRAN) 8 mg, dexamethasone (DECADRON) 10 mg in sodium chloride 0.9 % 50 mL IVPB, , Intravenous,  Once, 9 of 11 cycles Administration:  (09/30/2019),  (10/24/2019),  (11/14/2019),  (12/05/2019),  (12/26/2019),  (01/16/2020),  (02/06/2020),  (02/27/2020),  (03/19/2020)  for chemotherapy treatment.      CANCER STAGING: Cancer Staging No matching staging information was found for the patient.  INTERVAL HISTORY:  [name], a 79 y.o. male, returns for routine follow-up and consideration for next cycle of chemotherapy. Sabri was last seen on 03/19/2020.  Due for cycle #11 of docetaxel today.   Overall, he tells me he has been feeling pretty well. He reports difficulty getting up from sitting position, but once he stands he has no leg weakness. He denies having numbness or tingling and denies any jaw pain.  Overall, he feels ready for next cycle of chemo today.    REVIEW OF SYSTEMS:  Review of Systems  Constitutional: Positive for fatigue (moderate, occasional). Negative for appetite change.  HENT:   Positive for hearing loss.   All other systems reviewed and are negative.   PAST MEDICAL/SURGICAL HISTORY:  Past Medical History:  Diagnosis Date  . Cancer [facility])    prostate  .  Family history of breast cancer   . Family history of prostate cancer   . GERD (gastroesophageal reflux disease)   . HOH (hard of hearing)   . Hyperlipidemia   . Hypertension   . Port-A-Cath in place 08/29/2019  . Prostate cancer metastatic to bone ([place]) 04/04/2016  .  Sleep apnea 10/24/2016   Past Surgical History:  Procedure Laterality Date  . CATARACT EXTRACTION W/PHACO Right 04/22/2019   Procedure: CATARACT EXTRACTION PHACO AND INTRAOCULAR LENS PLACEMENT RIGHT EYE;  Surgeon: Baruch Goldmann, MD;  Location: [facility];  Service: Ophthalmology;  Laterality: Right;  right  . CATARACT EXTRACTION W/PHACO Left 05/13/2019   Procedure: CATARACT EXTRACTION PHACO AND INTRAOCULAR LENS PLACEMENT LEFT EYE;  Surgeon: Baruch Goldmann, MD;  Location: [facility];  Service: Ophthalmology;  Laterality: Left;  left  . CHOLECYSTECTOMY    . CYSTOSCOPY W/ URETERAL STENT PLACEMENT Left 08/17/2019   Procedure: CYSTOSCOPY WITH RETROGRADE PYELOGRAM/URETERAL STENT PLACEMENT;  Surgeon: Cleon Gustin, MD;  Location: [facility];  Service: Urology;  Laterality: Left;  . HAND SURGERY    . INGUINAL HERNIA REPAIR Left   . PORTACATH PLACEMENT Left 09/07/2019   Procedure: INSERTION PORT-A-CATH;  Surgeon: Virl Cagey, MD;  Location: [facility];  Service: General;  Laterality: Left;  . PROSTATECTOMY    . PROSTATECTOMY    . radical prostatectomy    . TONSILLECTOMY      SOCIAL HISTORY:  Social History   Socioeconomic History  . Marital status: Widowed    Spouse name: Not on file  . Number of children: 3  . Years of education: 9  . Highest education level: Not on file  Occupational History  . Occupation: Animator- retired    Comment: central telephone  Tobacco Use  . Smoking status: Former Smoker    Packs/day: 1.00    Years: 32.00    Pack years: 32.00    Types: Cigarettes    Quit date: 02/04/1989    Years since quitting: 31.2  . Smokeless tobacco: Never Used  Vaping Use  . Vaping Use: Never used  Substance and Sexual Activity  . Alcohol use: No  . Drug use: No  . Sexual activity: Not Currently  Other Topics Concern  . Not on file  Social History Narrative   Widowed   Loves with son   Chronic low back pain   Social Determinants of Health   Financial Resource Strain:   .  Difficulty of Paying Living Expenses:   Food Insecurity:   . Worried About Charity fundraiser in the Last Year:   . Arboriculturist in the Last Year:   Transportation Needs:   . Film/video editor (Medical):   Marland Kitchen Lack of Transportation (Non-Medical):   Physical Activity:   . Days of Exercise per Week:   . Minutes of Exercise per Session:   Stress:   . Feeling of Stress :   Social Connections:   . Frequency of Communication with Friends and Family:   . Frequency of Social Gatherings with Friends and Family:   . Attends Religious Services:   . Active Member of Clubs or Organizations:   . Attends Archivist Meetings:   Marland Kitchen Marital Status:   Intimate Partner Violence:   . Fear of Current or Ex-Partner:   . Emotionally Abused:   Marland Kitchen Physically Abused:   . Sexually Abused:     FAMILY HISTORY:  Family History  Problem Relation Age of Onset  . Heart failure Mother  CHF  . Cancer Mother        unknown  . Prostate cancer Father   . Narcolepsy Sister   . Cancer Daughter        unknown  . Breast cancer Maternal Aunt   . Cancer Maternal Grandmother        unknown  . Cancer Maternal Aunt        unknown type of cancer    CURRENT MEDICATIONS:  Current Outpatient Medications  Medication Sig Dispense Refill  . benazepril (LOTENSIN) 20 MG tablet TAKE ONE TABLET BY MOUTH DAILY. 90 tablet 3  . calcium-vitamin D (OSCAL WITH D) 500-200 MG-UNIT TABS tablet Take 1 tablet by mouth daily.  3  . cetirizine (ZYRTEC) 10 MG tablet TAKE ONE TABLET BY MOUTH DAILY. 30 tablet 0  . Cholecalciferol (VITAMIN D3) 25 MCG (1000 UT) CHEW Chew by mouth daily.    . DOCETAXEL IV Inject into the vein every 21 ( twenty-one) days.    . furosemide (LASIX) 40 MG tablet TAKE ONE TABLET BY MOUTH DAILY. TAKE WITH POTASSIUM. 30 tablet 5  . losartan (COZAAR) 100 MG tablet Take 100 mg by mouth daily.  5  . methylphenidate (RITALIN) 20 MG tablet Take 20 mg by mouth daily.  0  . mirabegron ER  (MYRBETRIQ) 25 MG TB24 tablet Take by mouth.    . potassium chloride (KLOR-CON) 20 MEQ packet Take by mouth.    . predniSONE (DELTASONE) 5 MG tablet Take 1 tablet (5 mg total) by mouth daily with breakfast. 30 tablet 2  . lidocaine-prilocaine (EMLA) cream Apply small amount to port a cath site and cover with plastic wrap 1 hour prior to chemotherapy appointments (Patient not taking: Reported on 04/12/2020) 30 g 0  . prochlorperazine (COMPAZINE) 10 MG tablet Take 1 tablet (10 mg total) by mouth every 6 (six) hours as needed for nausea or vomiting. (Patient not taking: Reported on 04/12/2020) 30 tablet 0  . traMADol (ULTRAM) 50 MG tablet Take 1 tablet (50 mg total) by mouth every 6 (six) hours as needed. (Patient not taking: Reported on 04/12/2020) 30 tablet 0   No current facility-administered medications for this visit.    ALLERGIES:  No Known Allergies  PHYSICAL EXAM:  Performance status (ECOG): 1 - Symptomatic but completely ambulatory  There were no vitals filed for this visit. Wt Readings from Last 3 Encounters:  04/12/20 255 lb 9.6 oz (115.9 kg)  03/19/20 255 lb (115.7 kg)  02/27/20 257 lb 12.8 oz (116.9 kg)   Physical Exam Vitals reviewed.  Constitutional:      Appearance: Normal appearance. He is obese.  Cardiovascular:     Rate and Rhythm: Normal rate and regular rhythm.     Pulses: Normal pulses.     Heart sounds: Normal heart sounds.  Pulmonary:     Effort: Pulmonary effort is normal.     Breath sounds: Normal breath sounds.  Abdominal:     Palpations: Abdomen is soft. There is no mass.     Tenderness: There is no abdominal tenderness.  Musculoskeletal:     Right lower leg: Edema (trace) present.     Left lower leg: Edema (trace) present.  Neurological:     General: No focal deficit present.     Mental Status: He is alert and oriented to person, place, and time.  Psychiatric:        Mood and Affect: Mood normal.        Behavior: Behavior normal.  LABORATORY  DATA:  I have reviewed the labs as listed.  CBC Latest Ref Rng & Units 04/12/2020 03/19/2020 02/27/2020  WBC 4.0 - 10.5 K/uL 8.1 8.4 7.8  Hemoglobin 13.0 - 17.0 g/dL 9.9(L) 10.2(L) 10.0(L)  Hematocrit 39 - 52 % 31.7(L) 32.9(L) 31.5(L)  Platelets 150 - 400 K/uL 214 200 228   CMP Latest Ref Rng & Units 04/12/2020 03/19/2020 02/27/2020  Glucose 70 - 99 mg/dL 91 124(H) 95  BUN 8 - 23 mg/dL 17 16 21   Creatinine 0.61 - 1.24 mg/dL 1.67(H) 1.60(H) 1.68(H)  Sodium 135 - 145 mmol/L 139 134(L) 135  Potassium 3.5 - 5.1 mmol/L 3.9 3.8 4.0  Chloride 98 - 111 mmol/L 101 102 101  CO2 22 - 32 mmol/L 26 22 23   Calcium 8.9 - 10.3 mg/dL 9.1 8.7(L) 9.7  Total Protein 6.5 - 8.1 g/dL 6.9 7.1 7.4  Total Bilirubin 0.3 - 1.2 mg/dL 0.7 0.7 0.8  Alkaline Phos 38 - 126 U/L 76 77 77  AST 15 - 41 U/L 22 23 19   ALT 0 - 44 U/L 17 17 17    Lab Results  Component Value Date   PSA 0.16 02/05/2017   PSA 0.15 01/07/2017   PSA 0.13 12/10/2016    DIAGNOSTIC IMAGING:  I have independently reviewed the scans and discussed with the patient.   ASSESSMENT:  1.  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 -Germline mutation testing negative.  Guardant 360 was negative for any actionable mutations. -9 cycles of docetaxel from 09/16/2019 through 02/27/2020. -CTAP on 12/01/2019 showed mild enlargement of the left iliac adenopathy measuring 6.4 x 4 cm, previously 5.6 x 3.77.  This was compared to CT scan from September 2020. -His PSA continuing to trend down.  His last PSA was 5.52 on 02/27/2020.   PLAN:  1.  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  We have reviewed his labs.  White count and LFTs are normal. -His last PSA on 03/19/2020 has increased to 7.05.  Prior to that it was trending down to as low as 5.52. -We will follow up on today's PSA.  He will proceed with his next cycle of docetaxel at the  same dose reduction level. -If the PSA continues to trend up, will consider doing CT of the chest, abdomen and pelvis.  Plan-he will come back in 3 weeks for follow-up.  2.  Bone metastasis: -Calcium is 9.1.  Continue monthly denosumab.  3.  CKD: -Baseline creatinine 1.5-1.9.  Today creatinine is 1.67.  4.  Normocytic anemia: -Hemoglobin today is 9.9 with MCV of 93. -Ferritin is 341, percent saturation is 17.  W80 and folic acid was normal.   Orders placed this encounter:  No orders of the defined types were placed in this encounter.    Derek Jack, MD [place] [phone]   I, Milinda Antis, am acting as a scribe for Dr. Sanda Linger.  I, Derek Jack MD, have reviewed the above documentation for accuracy and completeness, and I agree with the above.

## 2020-04-12 NOTE — Progress Notes (Signed)
Patient has been assessed, vital signs and labs have been reviewed by Dr. Katragadda. ANC, Creatinine, LFTs, and Platelets are within treatment parameters per Dr. Katragadda. The patient is good to proceed with treatment at this time.  

## 2020-04-12 NOTE — Patient Instructions (Addendum)
[name] at [facility] Discharge Instructions  You were seen today by Dr. Delton Coombes. He went over your recent results. Continue being physically active daily. Continue your routine care with your primary care provider. Dr. Delton Coombes will see you back in 3 weeks for labs and follow up.   Thank you for choosing [place] at [facility] to provide your oncology and hematology care.  To afford each patient quality time with our provider, please arrive at least 15 minutes before your scheduled appointment time.   If you have a lab appointment with the [facility] please come in thru the Main Entrance and check in at the main information desk  You need to re-schedule your appointment should you arrive 10 or more minutes late.  We strive to give you quality time with our providers, and arriving late affects you and other patients whose appointments are after yours.  Also, if you no show three or more times for appointments you may be dismissed from the clinic at the providers discretion.     Again, thank you for choosing [facility].  Our hope is that these requests will decrease the amount of time that you wait before being seen by our physicians.       _____________________________________________________________  Should you have questions after your visit to [facility], please contact our office at [phone] between the hours of 8:00 a.m. and 4:30 p.m.  Voicemails left after 4:00 p.m. will not be returned until the following business day.  For prescription refill requests, have your pharmacy contact our office and allow 72 hours.    Cancer Center Support Programs:   > Cancer Support Group  2nd Tuesday of the month 1pm-2pm, Journey Room

## 2020-04-12 NOTE — Progress Notes (Signed)
Verbal order ok to treat today per Dr. Delton Coombes.   Patient tolerated chemotherapy with no complaints voiced.  Side effects with management reviewed with understanding verbalized.  Port site clean and dry with no bruising or swelling noted at site.  Good blood return noted before and after administration of chemotherapy.  Band aid applied.  Patient left ambulatory with VSS and no s/s of distress noted.

## 2020-04-13 ENCOUNTER — Inpatient Hospital Stay (HOSPITAL_COMMUNITY): Payer: Medicare Other

## 2020-04-13 DIAGNOSIS — S8991XA Unspecified injury of right lower leg, initial encounter: Secondary | ICD-10-CM | POA: Diagnosis not present

## 2020-04-13 DIAGNOSIS — S80922A Unspecified superficial injury of left lower leg, initial encounter: Secondary | ICD-10-CM | POA: Diagnosis not present

## 2020-04-13 DIAGNOSIS — I130 Hypertensive heart and chronic kidney disease with heart failure and stage 1 through stage 4 chronic kidney disease, or unspecified chronic kidney disease: Secondary | ICD-10-CM | POA: Diagnosis not present

## 2020-04-13 DIAGNOSIS — S7221XD Displaced subtrochanteric fracture of right femur, subsequent encounter for closed fracture with routine healing: Secondary | ICD-10-CM | POA: Diagnosis not present

## 2020-04-13 DIAGNOSIS — R0902 Hypoxemia: Secondary | ICD-10-CM | POA: Diagnosis not present

## 2020-04-13 DIAGNOSIS — W19XXXA Unspecified fall, initial encounter: Secondary | ICD-10-CM | POA: Diagnosis not present

## 2020-04-13 DIAGNOSIS — S7222XA Displaced subtrochanteric fracture of left femur, initial encounter for closed fracture: Secondary | ICD-10-CM | POA: Diagnosis not present

## 2020-04-13 DIAGNOSIS — G8918 Other acute postprocedural pain: Secondary | ICD-10-CM | POA: Diagnosis not present

## 2020-04-13 DIAGNOSIS — Z01818 Encounter for other preprocedural examination: Secondary | ICD-10-CM | POA: Diagnosis not present

## 2020-04-13 DIAGNOSIS — C7951 Secondary malignant neoplasm of bone: Secondary | ICD-10-CM | POA: Diagnosis not present

## 2020-04-13 DIAGNOSIS — Z20822 Contact with and (suspected) exposure to covid-19: Secondary | ICD-10-CM | POA: Diagnosis not present

## 2020-04-13 DIAGNOSIS — S72001A Fracture of unspecified part of neck of right femur, initial encounter for closed fracture: Secondary | ICD-10-CM | POA: Diagnosis not present

## 2020-04-13 DIAGNOSIS — N3281 Overactive bladder: Secondary | ICD-10-CM | POA: Diagnosis not present

## 2020-04-13 DIAGNOSIS — Z743 Need for continuous supervision: Secondary | ICD-10-CM | POA: Diagnosis not present

## 2020-04-13 DIAGNOSIS — I10 Essential (primary) hypertension: Secondary | ICD-10-CM | POA: Diagnosis not present

## 2020-04-13 DIAGNOSIS — E785 Hyperlipidemia, unspecified: Secondary | ICD-10-CM | POA: Diagnosis not present

## 2020-04-13 DIAGNOSIS — R279 Unspecified lack of coordination: Secondary | ICD-10-CM | POA: Diagnosis not present

## 2020-04-13 DIAGNOSIS — I5032 Chronic diastolic (congestive) heart failure: Secondary | ICD-10-CM | POA: Diagnosis not present

## 2020-04-13 DIAGNOSIS — I452 Bifascicular block: Secondary | ICD-10-CM | POA: Diagnosis not present

## 2020-04-13 DIAGNOSIS — N1831 Chronic kidney disease, stage 3a: Secondary | ICD-10-CM | POA: Diagnosis not present

## 2020-04-13 DIAGNOSIS — D62 Acute posthemorrhagic anemia: Secondary | ICD-10-CM | POA: Diagnosis not present

## 2020-04-13 DIAGNOSIS — S7221XA Displaced subtrochanteric fracture of right femur, initial encounter for closed fracture: Secondary | ICD-10-CM | POA: Diagnosis not present

## 2020-04-13 DIAGNOSIS — R000 Tachycardia, unspecified: Secondary | ICD-10-CM | POA: Diagnosis not present

## 2020-04-17 DIAGNOSIS — C7982 Secondary malignant neoplasm of genital organs: Secondary | ICD-10-CM | POA: Diagnosis not present

## 2020-04-17 DIAGNOSIS — S7221XD Displaced subtrochanteric fracture of right femur, subsequent encounter for closed fracture with routine healing: Secondary | ICD-10-CM | POA: Diagnosis not present

## 2020-04-17 DIAGNOSIS — N189 Chronic kidney disease, unspecified: Secondary | ICD-10-CM | POA: Diagnosis not present

## 2020-04-17 DIAGNOSIS — R0902 Hypoxemia: Secondary | ICD-10-CM | POA: Diagnosis not present

## 2020-04-17 DIAGNOSIS — D638 Anemia in other chronic diseases classified elsewhere: Secondary | ICD-10-CM | POA: Diagnosis not present

## 2020-04-17 DIAGNOSIS — N1831 Chronic kidney disease, stage 3a: Secondary | ICD-10-CM | POA: Diagnosis not present

## 2020-04-17 DIAGNOSIS — R000 Tachycardia, unspecified: Secondary | ICD-10-CM | POA: Diagnosis not present

## 2020-04-17 DIAGNOSIS — N3281 Overactive bladder: Secondary | ICD-10-CM | POA: Diagnosis not present

## 2020-04-17 DIAGNOSIS — Z743 Need for continuous supervision: Secondary | ICD-10-CM | POA: Diagnosis not present

## 2020-04-17 DIAGNOSIS — R279 Unspecified lack of coordination: Secondary | ICD-10-CM | POA: Diagnosis not present

## 2020-04-17 DIAGNOSIS — C7951 Secondary malignant neoplasm of bone: Secondary | ICD-10-CM | POA: Diagnosis not present

## 2020-04-17 DIAGNOSIS — S72009A Fracture of unspecified part of neck of unspecified femur, initial encounter for closed fracture: Secondary | ICD-10-CM | POA: Diagnosis not present

## 2020-04-17 DIAGNOSIS — I10 Essential (primary) hypertension: Secondary | ICD-10-CM | POA: Diagnosis not present

## 2020-04-23 DIAGNOSIS — C7982 Secondary malignant neoplasm of genital organs: Secondary | ICD-10-CM | POA: Diagnosis not present

## 2020-04-23 DIAGNOSIS — I10 Essential (primary) hypertension: Secondary | ICD-10-CM | POA: Diagnosis not present

## 2020-04-23 DIAGNOSIS — N189 Chronic kidney disease, unspecified: Secondary | ICD-10-CM | POA: Diagnosis not present

## 2020-04-23 DIAGNOSIS — S72009A Fracture of unspecified part of neck of unspecified femur, initial encounter for closed fracture: Secondary | ICD-10-CM | POA: Diagnosis not present

## 2020-04-25 DIAGNOSIS — N1831 Chronic kidney disease, stage 3a: Secondary | ICD-10-CM | POA: Diagnosis not present

## 2020-04-25 DIAGNOSIS — D638 Anemia in other chronic diseases classified elsewhere: Secondary | ICD-10-CM | POA: Diagnosis not present

## 2020-04-25 DIAGNOSIS — S7221XD Displaced subtrochanteric fracture of right femur, subsequent encounter for closed fracture with routine healing: Secondary | ICD-10-CM | POA: Diagnosis not present

## 2020-04-25 DIAGNOSIS — R000 Tachycardia, unspecified: Secondary | ICD-10-CM | POA: Diagnosis not present

## 2020-04-30 DIAGNOSIS — S7221XD Displaced subtrochanteric fracture of right femur, subsequent encounter for closed fracture with routine healing: Secondary | ICD-10-CM | POA: Diagnosis not present

## 2020-04-30 DIAGNOSIS — R1311 Dysphagia, oral phase: Secondary | ICD-10-CM | POA: Diagnosis not present

## 2020-04-30 DIAGNOSIS — R278 Other lack of coordination: Secondary | ICD-10-CM | POA: Diagnosis not present

## 2020-05-03 ENCOUNTER — Ambulatory Visit (HOSPITAL_COMMUNITY): Payer: Medicare Other | Admitting: Hematology

## 2020-05-03 ENCOUNTER — Other Ambulatory Visit (HOSPITAL_COMMUNITY): Payer: Medicare Other

## 2020-05-03 ENCOUNTER — Ambulatory Visit (HOSPITAL_COMMUNITY): Payer: Medicare Other

## 2020-05-04 ENCOUNTER — Ambulatory Visit (HOSPITAL_COMMUNITY): Payer: Medicare Other

## 2020-05-04 DIAGNOSIS — D649 Anemia, unspecified: Secondary | ICD-10-CM | POA: Diagnosis not present

## 2020-05-18 DIAGNOSIS — C7951 Secondary malignant neoplasm of bone: Secondary | ICD-10-CM | POA: Diagnosis not present

## 2020-05-18 DIAGNOSIS — S7221XD Displaced subtrochanteric fracture of right femur, subsequent encounter for closed fracture with routine healing: Secondary | ICD-10-CM | POA: Diagnosis not present

## 2020-05-18 DIAGNOSIS — I10 Essential (primary) hypertension: Secondary | ICD-10-CM | POA: Diagnosis not present

## 2020-05-25 DIAGNOSIS — I129 Hypertensive chronic kidney disease with stage 1 through stage 4 chronic kidney disease, or unspecified chronic kidney disease: Secondary | ICD-10-CM | POA: Diagnosis not present

## 2020-05-25 DIAGNOSIS — C7951 Secondary malignant neoplasm of bone: Secondary | ICD-10-CM | POA: Diagnosis not present

## 2020-05-25 DIAGNOSIS — N183 Chronic kidney disease, stage 3 unspecified: Secondary | ICD-10-CM | POA: Diagnosis not present

## 2020-05-25 DIAGNOSIS — E785 Hyperlipidemia, unspecified: Secondary | ICD-10-CM | POA: Diagnosis not present

## 2020-05-25 DIAGNOSIS — N390 Urinary tract infection, site not specified: Secondary | ICD-10-CM | POA: Diagnosis not present

## 2020-05-28 DIAGNOSIS — I129 Hypertensive chronic kidney disease with stage 1 through stage 4 chronic kidney disease, or unspecified chronic kidney disease: Secondary | ICD-10-CM | POA: Diagnosis not present

## 2020-05-28 DIAGNOSIS — N183 Chronic kidney disease, stage 3 unspecified: Secondary | ICD-10-CM | POA: Diagnosis not present

## 2020-05-28 DIAGNOSIS — C7951 Secondary malignant neoplasm of bone: Secondary | ICD-10-CM | POA: Diagnosis not present

## 2020-05-28 DIAGNOSIS — E7849 Other hyperlipidemia: Secondary | ICD-10-CM | POA: Diagnosis not present

## 2020-05-29 DIAGNOSIS — S72001P Fracture of unspecified part of neck of right femur, subsequent encounter for closed fracture with malunion: Secondary | ICD-10-CM | POA: Diagnosis not present

## 2020-05-29 DIAGNOSIS — S7221XD Displaced subtrochanteric fracture of right femur, subsequent encounter for closed fracture with routine healing: Secondary | ICD-10-CM | POA: Diagnosis not present

## 2020-05-29 DIAGNOSIS — M1611 Unilateral primary osteoarthritis, right hip: Secondary | ICD-10-CM | POA: Diagnosis not present

## 2020-06-04 DIAGNOSIS — K219 Gastro-esophageal reflux disease without esophagitis: Secondary | ICD-10-CM | POA: Diagnosis not present

## 2020-06-04 DIAGNOSIS — S72009A Fracture of unspecified part of neck of unspecified femur, initial encounter for closed fracture: Secondary | ICD-10-CM | POA: Diagnosis not present

## 2020-06-04 DIAGNOSIS — C7982 Secondary malignant neoplasm of genital organs: Secondary | ICD-10-CM | POA: Diagnosis not present

## 2020-06-04 DIAGNOSIS — S7221XD Displaced subtrochanteric fracture of right femur, subsequent encounter for closed fracture with routine healing: Secondary | ICD-10-CM | POA: Diagnosis not present

## 2020-06-15 DIAGNOSIS — D649 Anemia, unspecified: Secondary | ICD-10-CM | POA: Diagnosis not present

## 2020-06-22 DIAGNOSIS — N390 Urinary tract infection, site not specified: Secondary | ICD-10-CM | POA: Diagnosis not present

## 2020-06-22 DIAGNOSIS — S7221XD Displaced subtrochanteric fracture of right femur, subsequent encounter for closed fracture with routine healing: Secondary | ICD-10-CM | POA: Diagnosis not present

## 2020-06-22 DIAGNOSIS — D638 Anemia in other chronic diseases classified elsewhere: Secondary | ICD-10-CM | POA: Diagnosis not present

## 2020-06-22 DIAGNOSIS — K5900 Constipation, unspecified: Secondary | ICD-10-CM | POA: Diagnosis not present

## 2020-06-22 DIAGNOSIS — G893 Neoplasm related pain (acute) (chronic): Secondary | ICD-10-CM | POA: Diagnosis not present

## 2020-06-28 DIAGNOSIS — M810 Age-related osteoporosis without current pathological fracture: Secondary | ICD-10-CM | POA: Diagnosis not present

## 2020-06-28 DIAGNOSIS — Z7952 Long term (current) use of systemic steroids: Secondary | ICD-10-CM | POA: Diagnosis not present

## 2020-06-28 DIAGNOSIS — C7951 Secondary malignant neoplasm of bone: Secondary | ICD-10-CM | POA: Diagnosis not present

## 2020-06-28 DIAGNOSIS — Z9181 History of falling: Secondary | ICD-10-CM | POA: Diagnosis not present

## 2020-06-28 DIAGNOSIS — K219 Gastro-esophageal reflux disease without esophagitis: Secondary | ICD-10-CM | POA: Diagnosis not present

## 2020-07-13 DIAGNOSIS — N390 Urinary tract infection, site not specified: Secondary | ICD-10-CM | POA: Diagnosis not present

## 2020-07-16 DIAGNOSIS — E7849 Other hyperlipidemia: Secondary | ICD-10-CM | POA: Diagnosis not present

## 2020-07-16 DIAGNOSIS — N183 Chronic kidney disease, stage 3 unspecified: Secondary | ICD-10-CM | POA: Diagnosis not present

## 2020-07-16 DIAGNOSIS — C7951 Secondary malignant neoplasm of bone: Secondary | ICD-10-CM | POA: Diagnosis not present

## 2020-07-16 DIAGNOSIS — I129 Hypertensive chronic kidney disease with stage 1 through stage 4 chronic kidney disease, or unspecified chronic kidney disease: Secondary | ICD-10-CM | POA: Diagnosis not present

## 2020-07-24 DIAGNOSIS — M170 Bilateral primary osteoarthritis of knee: Secondary | ICD-10-CM | POA: Diagnosis not present

## 2020-07-24 DIAGNOSIS — S7221XD Displaced subtrochanteric fracture of right femur, subsequent encounter for closed fracture with routine healing: Secondary | ICD-10-CM | POA: Diagnosis not present

## 2020-07-24 DIAGNOSIS — S72001P Fracture of unspecified part of neck of right femur, subsequent encounter for closed fracture with malunion: Secondary | ICD-10-CM | POA: Diagnosis not present

## 2020-07-24 DIAGNOSIS — M769 Unspecified enthesopathy, lower limb, excluding foot: Secondary | ICD-10-CM | POA: Diagnosis not present

## 2020-07-24 DIAGNOSIS — S72001D Fracture of unspecified part of neck of right femur, subsequent encounter for closed fracture with routine healing: Secondary | ICD-10-CM | POA: Diagnosis not present

## 2020-07-24 DIAGNOSIS — Z9889 Other specified postprocedural states: Secondary | ICD-10-CM | POA: Diagnosis not present

## 2020-07-24 DIAGNOSIS — M1611 Unilateral primary osteoarthritis, right hip: Secondary | ICD-10-CM | POA: Diagnosis not present

## 2020-08-03 DIAGNOSIS — C7951 Secondary malignant neoplasm of bone: Secondary | ICD-10-CM | POA: Diagnosis not present

## 2020-08-03 DIAGNOSIS — N183 Chronic kidney disease, stage 3 unspecified: Secondary | ICD-10-CM | POA: Diagnosis not present

## 2020-08-03 DIAGNOSIS — E7849 Other hyperlipidemia: Secondary | ICD-10-CM | POA: Diagnosis not present

## 2020-08-03 DIAGNOSIS — I129 Hypertensive chronic kidney disease with stage 1 through stage 4 chronic kidney disease, or unspecified chronic kidney disease: Secondary | ICD-10-CM | POA: Diagnosis not present

## 2020-08-07 DIAGNOSIS — G8929 Other chronic pain: Secondary | ICD-10-CM | POA: Diagnosis not present

## 2020-08-07 DIAGNOSIS — R52 Pain, unspecified: Secondary | ICD-10-CM | POA: Diagnosis not present

## 2020-08-07 DIAGNOSIS — I10 Essential (primary) hypertension: Secondary | ICD-10-CM | POA: Diagnosis not present

## 2020-08-07 DIAGNOSIS — N3281 Overactive bladder: Secondary | ICD-10-CM | POA: Diagnosis not present

## 2020-08-22 DIAGNOSIS — R300 Dysuria: Secondary | ICD-10-CM | POA: Diagnosis not present

## 2020-08-22 DIAGNOSIS — B379 Candidiasis, unspecified: Secondary | ICD-10-CM | POA: Diagnosis not present

## 2020-08-29 DIAGNOSIS — B379 Candidiasis, unspecified: Secondary | ICD-10-CM | POA: Diagnosis not present

## 2020-08-29 DIAGNOSIS — G8929 Other chronic pain: Secondary | ICD-10-CM | POA: Diagnosis not present

## 2020-09-13 DIAGNOSIS — M25551 Pain in right hip: Secondary | ICD-10-CM | POA: Diagnosis not present

## 2020-09-18 DIAGNOSIS — M898X5 Other specified disorders of bone, thigh: Secondary | ICD-10-CM | POA: Diagnosis not present

## 2020-09-18 DIAGNOSIS — M6281 Muscle weakness (generalized): Secondary | ICD-10-CM | POA: Diagnosis not present

## 2020-09-18 DIAGNOSIS — M80051P Age-related osteoporosis with current pathological fracture, right femur, subsequent encounter for fracture with malunion: Secondary | ICD-10-CM | POA: Diagnosis not present

## 2020-09-18 DIAGNOSIS — Z79899 Other long term (current) drug therapy: Secondary | ICD-10-CM | POA: Diagnosis not present

## 2020-09-18 DIAGNOSIS — M79651 Pain in right thigh: Secondary | ICD-10-CM | POA: Diagnosis not present

## 2020-09-18 DIAGNOSIS — G47419 Narcolepsy without cataplexy: Secondary | ICD-10-CM | POA: Diagnosis not present

## 2020-09-20 DIAGNOSIS — M6281 Muscle weakness (generalized): Secondary | ICD-10-CM | POA: Diagnosis not present

## 2020-09-20 DIAGNOSIS — M80051P Age-related osteoporosis with current pathological fracture, right femur, subsequent encounter for fracture with malunion: Secondary | ICD-10-CM | POA: Diagnosis not present

## 2020-09-21 DIAGNOSIS — M6281 Muscle weakness (generalized): Secondary | ICD-10-CM | POA: Diagnosis not present

## 2020-09-21 DIAGNOSIS — M80051P Age-related osteoporosis with current pathological fracture, right femur, subsequent encounter for fracture with malunion: Secondary | ICD-10-CM | POA: Diagnosis not present

## 2020-09-22 DIAGNOSIS — M6281 Muscle weakness (generalized): Secondary | ICD-10-CM | POA: Diagnosis not present

## 2020-09-22 DIAGNOSIS — M80051P Age-related osteoporosis with current pathological fracture, right femur, subsequent encounter for fracture with malunion: Secondary | ICD-10-CM | POA: Diagnosis not present

## 2020-09-24 DIAGNOSIS — M80051P Age-related osteoporosis with current pathological fracture, right femur, subsequent encounter for fracture with malunion: Secondary | ICD-10-CM | POA: Diagnosis not present

## 2020-09-24 DIAGNOSIS — M6281 Muscle weakness (generalized): Secondary | ICD-10-CM | POA: Diagnosis not present

## 2020-09-25 DIAGNOSIS — S72001D Fracture of unspecified part of neck of right femur, subsequent encounter for closed fracture with routine healing: Secondary | ICD-10-CM | POA: Diagnosis not present

## 2020-09-25 DIAGNOSIS — N189 Chronic kidney disease, unspecified: Secondary | ICD-10-CM | POA: Diagnosis not present

## 2020-09-25 DIAGNOSIS — M80051P Age-related osteoporosis with current pathological fracture, right femur, subsequent encounter for fracture with malunion: Secondary | ICD-10-CM | POA: Diagnosis not present

## 2020-09-25 DIAGNOSIS — M1711 Unilateral primary osteoarthritis, right knee: Secondary | ICD-10-CM | POA: Diagnosis not present

## 2020-09-25 DIAGNOSIS — M6281 Muscle weakness (generalized): Secondary | ICD-10-CM | POA: Diagnosis not present

## 2020-09-25 DIAGNOSIS — M5137 Other intervertebral disc degeneration, lumbosacral region: Secondary | ICD-10-CM | POA: Diagnosis not present

## 2020-09-25 DIAGNOSIS — M1611 Unilateral primary osteoarthritis, right hip: Secondary | ICD-10-CM | POA: Diagnosis not present

## 2020-09-26 DIAGNOSIS — Z5181 Encounter for therapeutic drug level monitoring: Secondary | ICD-10-CM | POA: Diagnosis not present

## 2020-09-26 DIAGNOSIS — M80051P Age-related osteoporosis with current pathological fracture, right femur, subsequent encounter for fracture with malunion: Secondary | ICD-10-CM | POA: Diagnosis not present

## 2020-09-26 DIAGNOSIS — N1831 Chronic kidney disease, stage 3a: Secondary | ICD-10-CM | POA: Diagnosis not present

## 2020-09-26 DIAGNOSIS — R600 Localized edema: Secondary | ICD-10-CM | POA: Diagnosis not present

## 2020-09-26 DIAGNOSIS — M6281 Muscle weakness (generalized): Secondary | ICD-10-CM | POA: Diagnosis not present

## 2020-09-27 DIAGNOSIS — M80051P Age-related osteoporosis with current pathological fracture, right femur, subsequent encounter for fracture with malunion: Secondary | ICD-10-CM | POA: Diagnosis not present

## 2020-09-27 DIAGNOSIS — M6281 Muscle weakness (generalized): Secondary | ICD-10-CM | POA: Diagnosis not present

## 2020-09-28 DIAGNOSIS — M80051P Age-related osteoporosis with current pathological fracture, right femur, subsequent encounter for fracture with malunion: Secondary | ICD-10-CM | POA: Diagnosis not present

## 2020-09-28 DIAGNOSIS — M6281 Muscle weakness (generalized): Secondary | ICD-10-CM | POA: Diagnosis not present

## 2020-09-29 DIAGNOSIS — Z79899 Other long term (current) drug therapy: Secondary | ICD-10-CM | POA: Diagnosis not present

## 2020-10-01 DIAGNOSIS — M80051P Age-related osteoporosis with current pathological fracture, right femur, subsequent encounter for fracture with malunion: Secondary | ICD-10-CM | POA: Diagnosis not present

## 2020-10-01 DIAGNOSIS — M6281 Muscle weakness (generalized): Secondary | ICD-10-CM | POA: Diagnosis not present

## 2020-10-02 DIAGNOSIS — M6281 Muscle weakness (generalized): Secondary | ICD-10-CM | POA: Diagnosis not present

## 2020-10-02 DIAGNOSIS — R410 Disorientation, unspecified: Secondary | ICD-10-CM | POA: Diagnosis not present

## 2020-10-02 DIAGNOSIS — M80051P Age-related osteoporosis with current pathological fracture, right femur, subsequent encounter for fracture with malunion: Secondary | ICD-10-CM | POA: Diagnosis not present

## 2020-10-02 DIAGNOSIS — R10819 Abdominal tenderness, unspecified site: Secondary | ICD-10-CM | POA: Diagnosis not present

## 2020-10-03 DIAGNOSIS — D649 Anemia, unspecified: Secondary | ICD-10-CM | POA: Diagnosis not present

## 2020-10-03 DIAGNOSIS — M80051P Age-related osteoporosis with current pathological fracture, right femur, subsequent encounter for fracture with malunion: Secondary | ICD-10-CM | POA: Diagnosis not present

## 2020-10-03 DIAGNOSIS — M6281 Muscle weakness (generalized): Secondary | ICD-10-CM | POA: Diagnosis not present

## 2020-10-03 DIAGNOSIS — N189 Chronic kidney disease, unspecified: Secondary | ICD-10-CM | POA: Diagnosis not present

## 2020-10-04 DIAGNOSIS — M6281 Muscle weakness (generalized): Secondary | ICD-10-CM | POA: Diagnosis not present

## 2020-10-04 DIAGNOSIS — M80051P Age-related osteoporosis with current pathological fracture, right femur, subsequent encounter for fracture with malunion: Secondary | ICD-10-CM | POA: Diagnosis not present

## 2020-10-05 DIAGNOSIS — M80051P Age-related osteoporosis with current pathological fracture, right femur, subsequent encounter for fracture with malunion: Secondary | ICD-10-CM | POA: Diagnosis not present

## 2020-10-05 DIAGNOSIS — M6281 Muscle weakness (generalized): Secondary | ICD-10-CM | POA: Diagnosis not present

## 2020-10-08 ENCOUNTER — Emergency Department (HOSPITAL_COMMUNITY): Payer: Medicare Other

## 2020-10-08 ENCOUNTER — Encounter (HOSPITAL_COMMUNITY): Payer: Self-pay | Admitting: *Deleted

## 2020-10-08 ENCOUNTER — Emergency Department (HOSPITAL_COMMUNITY)
Admission: EM | Admit: 2020-10-08 | Discharge: 2020-10-08 | Disposition: A | Discharge status: Home / Self Care | Payer: Medicare Other | Attending: Emergency Medicine | Admitting: Emergency Medicine

## 2020-10-08 ENCOUNTER — Other Ambulatory Visit: Payer: Self-pay

## 2020-10-08 DIAGNOSIS — R531 Weakness: Secondary | ICD-10-CM | POA: Diagnosis not present

## 2020-10-08 DIAGNOSIS — N1831 Chronic kidney disease, stage 3a: Secondary | ICD-10-CM | POA: Insufficient documentation

## 2020-10-08 DIAGNOSIS — R451 Restlessness and agitation: Secondary | ICD-10-CM | POA: Diagnosis not present

## 2020-10-08 DIAGNOSIS — R404 Transient alteration of awareness: Secondary | ICD-10-CM | POA: Diagnosis not present

## 2020-10-08 DIAGNOSIS — M80051P Age-related osteoporosis with current pathological fracture, right femur, subsequent encounter for fracture with malunion: Secondary | ICD-10-CM | POA: Diagnosis not present

## 2020-10-08 DIAGNOSIS — G319 Degenerative disease of nervous system, unspecified: Secondary | ICD-10-CM | POA: Diagnosis not present

## 2020-10-08 DIAGNOSIS — I129 Hypertensive chronic kidney disease with stage 1 through stage 4 chronic kidney disease, or unspecified chronic kidney disease: Secondary | ICD-10-CM | POA: Insufficient documentation

## 2020-10-08 DIAGNOSIS — Z87891 Personal history of nicotine dependence: Secondary | ICD-10-CM | POA: Insufficient documentation

## 2020-10-08 DIAGNOSIS — Z79899 Other long term (current) drug therapy: Secondary | ICD-10-CM | POA: Diagnosis not present

## 2020-10-08 DIAGNOSIS — J3489 Other specified disorders of nose and nasal sinuses: Secondary | ICD-10-CM | POA: Diagnosis not present

## 2020-10-08 DIAGNOSIS — Z8546 Personal history of malignant neoplasm of prostate: Secondary | ICD-10-CM | POA: Diagnosis not present

## 2020-10-08 DIAGNOSIS — I6782 Cerebral ischemia: Secondary | ICD-10-CM | POA: Diagnosis not present

## 2020-10-08 DIAGNOSIS — Z743 Need for continuous supervision: Secondary | ICD-10-CM | POA: Diagnosis not present

## 2020-10-08 DIAGNOSIS — F0150 Vascular dementia without behavioral disturbance: Secondary | ICD-10-CM | POA: Diagnosis not present

## 2020-10-08 DIAGNOSIS — R4182 Altered mental status, unspecified: Secondary | ICD-10-CM | POA: Diagnosis present

## 2020-10-08 DIAGNOSIS — Z85830 Personal history of malignant neoplasm of bone: Secondary | ICD-10-CM | POA: Insufficient documentation

## 2020-10-08 DIAGNOSIS — I10 Essential (primary) hypertension: Secondary | ICD-10-CM | POA: Diagnosis not present

## 2020-10-08 DIAGNOSIS — M6281 Muscle weakness (generalized): Secondary | ICD-10-CM | POA: Diagnosis not present

## 2020-10-08 HISTORY — DX: Unspecified dementia, unspecified severity, without behavioral disturbance, psychotic disturbance, mood disturbance, and anxiety: F03.90

## 2020-10-08 HISTORY — DX: History of falling: Z91.81

## 2020-10-08 HISTORY — DX: Chronic kidney disease, stage 3a: N18.31

## 2020-10-08 HISTORY — DX: Anemia, unspecified: D64.9

## 2020-10-08 HISTORY — DX: Overactive bladder: N32.81

## 2020-10-08 LAB — CBC WITH DIFFERENTIAL/PLATELET
Abs Immature Granulocytes: 0.02 10*3/uL (ref 0.00–0.07)
Basophils Absolute: 0 10*3/uL (ref 0.0–0.1)
Basophils Relative: 0 %
Eosinophils Absolute: 0.1 10*3/uL (ref 0.0–0.5)
Eosinophils Relative: 1 %
HCT: 30 % — ABNORMAL LOW (ref 39.0–52.0)
Hemoglobin: 9.4 g/dL — ABNORMAL LOW (ref 13.0–17.0)
Immature Granulocytes: 0 %
Lymphocytes Relative: 31 %
Lymphs Abs: 2.2 10*3/uL (ref 0.7–4.0)
MCH: 28.3 pg (ref 26.0–34.0)
MCHC: 31.3 g/dL (ref 30.0–36.0)
MCV: 90.4 fL (ref 80.0–100.0)
Monocytes Absolute: 0.4 10*3/uL (ref 0.1–1.0)
Monocytes Relative: 6 %
Neutro Abs: 4.4 10*3/uL (ref 1.7–7.7)
Neutrophils Relative %: 62 %
Platelets: 214 10*3/uL (ref 150–400)
RBC: 3.32 MIL/uL — ABNORMAL LOW (ref 4.22–5.81)
RDW: 18 % — ABNORMAL HIGH (ref 11.5–15.5)
WBC: 7.1 10*3/uL (ref 4.0–10.5)
nRBC: 0 % (ref 0.0–0.2)

## 2020-10-08 LAB — BASIC METABOLIC PANEL
Anion gap: 8 (ref 5–15)
BUN: 16 mg/dL (ref 8–23)
CO2: 26 mmol/L (ref 22–32)
Calcium: 9.3 mg/dL (ref 8.9–10.3)
Chloride: 103 mmol/L (ref 98–111)
Creatinine, Ser: 1.56 mg/dL — ABNORMAL HIGH (ref 0.61–1.24)
GFR, Estimated: 44 mL/min — ABNORMAL LOW (ref >60)
Glucose, Bld: 99 mg/dL (ref 70–99)
Potassium: 4 mmol/L (ref 3.5–5.1)
Sodium: 137 mmol/L (ref 135–145)

## 2020-10-08 NOTE — ED Provider Notes (Signed)
[facility] Provider Note   CSN: [id number] Arrival date & time: 10/08/20  1739     History Chief Complaint  Patient presents with  . Altered Mental Status    [name] is a 79 y.o. male.  Patient comes in with some increased confusion over the last week or so he has a history of prostate cancer.  Patient had his urine checked at the nursing home which was unremarkable.  Patient also with history dementia  The history is provided by the patient and the nursing home. No language interpreter was used.  Altered Mental Status Presenting symptoms: behavior changes   Severity:  Mild Most recent episode:  2 days ago Episode history:  Multiple Timing:  Constant Progression:  Waxing and waning Chronicity:  Recurrent Context: dementia   Associated symptoms: no abdominal pain, no hallucinations, no headaches, no rash and no seizures        Past Medical History:  Diagnosis Date  . Anemia   . Cancer [facility])    prostate  . Chronic kidney disease, stage 3a ([place])   . Dementia ([place])   . Family history of breast cancer   . Family history of prostate cancer   . GERD (gastroesophageal reflux disease)   . History of falling   . HOH (hard of hearing)   . Hyperlipidemia   . Hypertension   . Overactive bladder   . Port-A-Cath in place 08/29/2019  . 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 Sleep apnea 10/24/2016    Patient Active Problem List   Diagnosis Date Noted  . Port-A-Cath in place 08/29/2019  . Goals of care, counseling/discussion 08/25/2019  . Genetic testing 07/14/2019  . Family history of prostate cancer   . Family history of breast cancer   . Tubular adenoma of colon 12/03/2016  . Morbid obesity ([place]) 10/24/2016  . Essential hypertension 10/24/2016  . HLD (hyperlipidemia) 10/24/2016  . 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 Chronic back pain 10/24/2016  . PUD (peptic ulcer disease) 10/24/2016  . GERD  (gastroesophageal reflux disease) 10/24/2016  . Hemorrhoids 10/24/2016  . Sleep apnea 10/24/2016  . Uncontrolled daytime somnolence 10/24/2016  . HOH (hard of hearing) 10/24/2016  . Testicular discomfort 05/07/2016  . Prostate cancer metastatic to bone [facility]) 04/04/2016    Past Surgical History:  Procedure Laterality Date  . CATARACT EXTRACTION W/PHACO Right 04/22/2019   Procedure: CATARACT EXTRACTION PHACO AND INTRAOCULAR LENS PLACEMENT RIGHT EYE;  Surgeon: Baruch Goldmann, MD;  Location: [facility];  Service: Ophthalmology;  Laterality: Right;  right  . CATARACT EXTRACTION W/PHACO Left 05/13/2019   Procedure: CATARACT EXTRACTION PHACO AND INTRAOCULAR LENS PLACEMENT LEFT EYE;  Surgeon: Baruch Goldmann, MD;  Location: [facility];  Service: Ophthalmology;  Laterality: Left;  left  . CHOLECYSTECTOMY    . CYSTOSCOPY W/ URETERAL STENT PLACEMENT Left 08/17/2019   Procedure: CYSTOSCOPY WITH RETROGRADE PYELOGRAM/URETERAL STENT PLACEMENT;  Surgeon: Cleon Gustin, MD;  Location: [facility];  Service: Urology;  Laterality: Left;  . HAND SURGERY    . INGUINAL HERNIA REPAIR Left   . PORTACATH PLACEMENT Left 09/07/2019   Procedure: INSERTION PORT-A-CATH;  Surgeon: Virl Cagey, MD;  Location: [facility];  Service: General;  Laterality: Left;  . PROSTATECTOMY    . PROSTATECTOMY    . radical prostatectomy    . TONSILLECTOMY         Family History  Problem Relation Age of Onset  . Heart failure Mother  CHF  . Cancer Mother        unknown  . Prostate cancer Father   . Narcolepsy Sister   . Cancer Daughter        unknown  . Breast cancer Maternal Aunt   . Cancer Maternal Grandmother        unknown  . Cancer Maternal Aunt        unknown type of cancer    Social History   Tobacco Use  . Smoking status: Former Smoker    Packs/day: 1.00    Years: 32.00    Pack years: 32.00    Types: Cigarettes    Quit date: 02/04/1989    Years since quitting: 31.6  . Smokeless tobacco: Never Used   Vaping Use  . Vaping Use: Never used  Substance Use Topics  . Alcohol use: No  . Drug use: No    Home Medications Prior to Admission medications   Medication Sig Start Date End Date Taking? Authorizing Provider  benazepril (LOTENSIN) 20 MG tablet TAKE ONE TABLET BY MOUTH DAILY. 08/14/17   [name], MD  calcium-vitamin D (OSCAL WITH D) 500-200 MG-UNIT TABS tablet Take 1 tablet by mouth daily. 07/19/18   [provider]  cetirizine (ZYRTEC) 10 MG tablet TAKE ONE TABLET BY MOUTH DAILY. 11/17/17   [name], MD  Cholecalciferol (VITAMIN D3) 25 MCG (1000 UT) CHEW Chew by mouth daily.    [provider]  DOCETAXEL IV Inject into the vein every 21 ( twenty-one) days. 08/26/19   [provider]  furosemide (LASIX) 40 MG tablet TAKE ONE TABLET BY MOUTH DAILY. TAKE WITH POTASSIUM. 03/19/20   Derek Jack, MD  lidocaine-prilocaine (EMLA) cream Apply small amount to port a cath site and cover with plastic wrap 1 hour prior to chemotherapy appointments Patient not taking: Reported on 04/12/2020 08/16/19   Derek Jack, MD  losartan (COZAAR) 100 MG tablet Take 100 mg by mouth daily. 04/02/18   [provider]  methylphenidate (RITALIN) 20 MG tablet Take 20 mg by mouth daily. 07/03/18   [provider]  mirabegron ER (MYRBETRIQ) 25 MG TB24 tablet Take by mouth.    [provider]  potassium chloride (KLOR-CON) 20 MEQ packet Take by mouth.    [provider]  predniSONE (DELTASONE) 5 MG tablet Take 1 tablet (5 mg total) by mouth daily with breakfast. 02/06/20   Derek Jack, MD  prochlorperazine (COMPAZINE) 10 MG tablet Take 1 tablet (10 mg total) by mouth every 6 (six) hours as needed for nausea or vomiting. Patient not taking: Reported on 04/12/2020 09/30/19   Roger Shelter, FNP    Allergies    Patient has no known allergies.  Review of Systems   Review of Systems  Constitutional: Negative for appetite  change and fatigue.  HENT: Negative for congestion, ear discharge and sinus pressure.   Eyes: Negative for discharge.  Respiratory: Negative for cough.   Cardiovascular: Negative for chest pain.  Gastrointestinal: Negative for abdominal pain and diarrhea.  Genitourinary: Negative for frequency and hematuria.  Musculoskeletal: Negative for back pain.  Skin: Negative for rash.  Neurological: Negative for seizures and headaches.  Psychiatric/Behavioral: Negative for hallucinations.    Physical Exam Updated Vital Signs BP 140/70   Pulse 72   Temp 98.8 F (37.1 C) (Oral)   Resp 16   Ht 5' 9.5" (1.765 m)   Wt 115.9 kg   SpO2 98%   BMI 37.19 kg/m   Physical Exam  Vitals and nursing note reviewed.  Constitutional:      Appearance: He is well-developed.  HENT:     Head: Normocephalic.     Nose: Nose normal.  Eyes:     General: No scleral icterus.    Extraocular Movements: EOM normal.     Conjunctiva/sclera: Conjunctivae normal.  Neck:     Thyroid: No thyromegaly.  Cardiovascular:     Rate and Rhythm: Normal rate and regular rhythm.     Heart sounds: No murmur heard. No friction rub. No gallop.   Pulmonary:     Breath sounds: No stridor. No wheezing or rales.  Chest:     Chest wall: No tenderness.  Abdominal:     General: There is no distension.     Tenderness: There is no abdominal tenderness. There is no rebound.  Musculoskeletal:        General: No edema. Normal range of motion.     Cervical back: Neck supple.  Lymphadenopathy:     Cervical: No cervical adenopathy.  Skin:    Findings: No erythema or rash.  Neurological:     Mental Status: He is alert and oriented to person, place, and time.     Motor: No abnormal muscle tone.     Coordination: Coordination normal.     Comments: Mild confusion  Psychiatric:        Mood and Affect: Mood and affect normal.        Behavior: Behavior normal.     ED Results / Procedures / Treatments   Labs (all labs ordered are  listed, but only abnormal results are displayed) Labs Reviewed  CBC WITH DIFFERENTIAL/PLATELET - Abnormal; Notable for the following components:      Result Value   RBC 3.32 (*)    Hemoglobin 9.4 (*)    HCT 30.0 (*)    RDW 18.0 (*)    All other components within normal limits  BASIC METABOLIC PANEL - Abnormal; Notable for the following components:   Creatinine, Ser 1.56 (*)    GFR, Estimated 44 (*)    All other components within normal limits    EKG None  Radiology CT Head Wo Contrast  Result Date: 10/08/2020 CLINICAL DATA:  Combative and agitated EXAM: CT HEAD WITHOUT CONTRAST TECHNIQUE: Contiguous axial images were obtained from the base of the skull through the vertex without intravenous contrast. COMPARISON:  CT brain 04/12/2020 FINDINGS: Brain: No acute territorial infarction, hemorrhage or intracranial mass. Moderate atrophy. Moderate hypodensity in the white matter consistent with chronic small vessel ischemic change. Stable ventricle size. Vascular: No hyperdense vessels.  No unexpected calcification Skull: Normal. Negative for fracture or focal lesion. Sinuses/Orbits: Mucosal thickening in the sphenoid sinus and ethmoid sinuses. Other: None IMPRESSION: 1. No CT evidence for acute intracranial abnormality. 2. Atrophy and chronic small vessel ischemic changes of the white matter. Electronically Signed   By: Donavan Foil M.D.   On: 10/08/2020 18:59    Procedures Procedures (including critical care time)  Medications Ordered in ED Medications - No data to display  ED Course  I have reviewed the triage vital signs and the nursing notes.  Pertinent labs & imaging results that were available during my care of the patient were reviewed by me and considered in my medical decision making (see chart for details).    MDM Rules/Calculators/A&P          Labs unremarkable CT scan negative.  Patient with worsening dementia.  He will be sent back to the nursing  home Final Clinical Impression(s) / ED Diagnoses Final diagnoses:  Vascular dementia without behavioral disturbance [facility])    Rx / DC Orders ED Discharge Orders    None       Milton Ferguson, MD 10/12/20 [phone]

## 2020-10-08 NOTE — ED Triage Notes (Addendum)
Pt brought in by [facility] with c/o AMS worsening today, but progressing over the last couple of weeks. Hx of dementia but usually alert and oriented per nursing home staff. Staff reports pt has been very combative and agitated with them. Pt calm and cooperative at time of triage.  Pt has hx of prostate cancer with mets to the bone. Staff concerned this new AMS may be related to his cancer spreading.

## 2020-10-08 NOTE — Discharge Instructions (Addendum)
Follow up with your md at the nursing home

## 2020-10-09 DIAGNOSIS — G8929 Other chronic pain: Secondary | ICD-10-CM | POA: Diagnosis not present

## 2020-10-09 DIAGNOSIS — R52 Pain, unspecified: Secondary | ICD-10-CM | POA: Diagnosis not present

## 2020-10-09 DIAGNOSIS — M6281 Muscle weakness (generalized): Secondary | ICD-10-CM | POA: Diagnosis not present

## 2020-10-09 DIAGNOSIS — M80051P Age-related osteoporosis with current pathological fracture, right femur, subsequent encounter for fracture with malunion: Secondary | ICD-10-CM | POA: Diagnosis not present

## 2020-10-10 DIAGNOSIS — R300 Dysuria: Secondary | ICD-10-CM | POA: Diagnosis not present

## 2020-10-10 DIAGNOSIS — M6281 Muscle weakness (generalized): Secondary | ICD-10-CM | POA: Diagnosis not present

## 2020-10-10 DIAGNOSIS — M80051P Age-related osteoporosis with current pathological fracture, right femur, subsequent encounter for fracture with malunion: Secondary | ICD-10-CM | POA: Diagnosis not present

## 2020-10-10 DIAGNOSIS — R600 Localized edema: Secondary | ICD-10-CM | POA: Diagnosis not present

## 2020-10-10 DIAGNOSIS — R059 Cough, unspecified: Secondary | ICD-10-CM | POA: Diagnosis not present

## 2020-10-11 DIAGNOSIS — M80051P Age-related osteoporosis with current pathological fracture, right femur, subsequent encounter for fracture with malunion: Secondary | ICD-10-CM | POA: Diagnosis not present

## 2020-10-11 DIAGNOSIS — M6281 Muscle weakness (generalized): Secondary | ICD-10-CM | POA: Diagnosis not present

## 2020-10-12 DIAGNOSIS — M6281 Muscle weakness (generalized): Secondary | ICD-10-CM | POA: Diagnosis not present

## 2020-10-12 DIAGNOSIS — M80051P Age-related osteoporosis with current pathological fracture, right femur, subsequent encounter for fracture with malunion: Secondary | ICD-10-CM | POA: Diagnosis not present

## 2020-10-15 DIAGNOSIS — M6281 Muscle weakness (generalized): Secondary | ICD-10-CM | POA: Diagnosis not present

## 2020-10-15 DIAGNOSIS — M80051P Age-related osteoporosis with current pathological fracture, right femur, subsequent encounter for fracture with malunion: Secondary | ICD-10-CM | POA: Diagnosis not present

## 2020-10-16 DIAGNOSIS — N189 Chronic kidney disease, unspecified: Secondary | ICD-10-CM | POA: Diagnosis not present

## 2020-10-16 DIAGNOSIS — M80051P Age-related osteoporosis with current pathological fracture, right femur, subsequent encounter for fracture with malunion: Secondary | ICD-10-CM | POA: Diagnosis not present

## 2020-10-16 DIAGNOSIS — D649 Anemia, unspecified: Secondary | ICD-10-CM | POA: Diagnosis not present

## 2020-10-16 DIAGNOSIS — M6281 Muscle weakness (generalized): Secondary | ICD-10-CM | POA: Diagnosis not present

## 2020-10-17 DIAGNOSIS — M80051P Age-related osteoporosis with current pathological fracture, right femur, subsequent encounter for fracture with malunion: Secondary | ICD-10-CM | POA: Diagnosis not present

## 2020-10-17 DIAGNOSIS — M6281 Muscle weakness (generalized): Secondary | ICD-10-CM | POA: Diagnosis not present

## 2020-10-18 DIAGNOSIS — M80051P Age-related osteoporosis with current pathological fracture, right femur, subsequent encounter for fracture with malunion: Secondary | ICD-10-CM | POA: Diagnosis not present

## 2020-10-18 DIAGNOSIS — M6281 Muscle weakness (generalized): Secondary | ICD-10-CM | POA: Diagnosis not present

## 2020-10-19 DIAGNOSIS — M6281 Muscle weakness (generalized): Secondary | ICD-10-CM | POA: Diagnosis not present

## 2020-10-19 DIAGNOSIS — M80051P Age-related osteoporosis with current pathological fracture, right femur, subsequent encounter for fracture with malunion: Secondary | ICD-10-CM | POA: Diagnosis not present

## 2020-10-23 DIAGNOSIS — M80051P Age-related osteoporosis with current pathological fracture, right femur, subsequent encounter for fracture with malunion: Secondary | ICD-10-CM | POA: Diagnosis not present

## 2020-10-23 DIAGNOSIS — M6281 Muscle weakness (generalized): Secondary | ICD-10-CM | POA: Diagnosis not present

## 2020-10-24 DIAGNOSIS — M80051P Age-related osteoporosis with current pathological fracture, right femur, subsequent encounter for fracture with malunion: Secondary | ICD-10-CM | POA: Diagnosis not present

## 2020-10-24 DIAGNOSIS — M6281 Muscle weakness (generalized): Secondary | ICD-10-CM | POA: Diagnosis not present

## 2020-10-25 DIAGNOSIS — M6281 Muscle weakness (generalized): Secondary | ICD-10-CM | POA: Diagnosis not present

## 2020-10-25 DIAGNOSIS — M80051P Age-related osteoporosis with current pathological fracture, right femur, subsequent encounter for fracture with malunion: Secondary | ICD-10-CM | POA: Diagnosis not present

## 2020-10-26 DIAGNOSIS — M6281 Muscle weakness (generalized): Secondary | ICD-10-CM | POA: Diagnosis not present

## 2020-10-26 DIAGNOSIS — M80051P Age-related osteoporosis with current pathological fracture, right femur, subsequent encounter for fracture with malunion: Secondary | ICD-10-CM | POA: Diagnosis not present

## 2020-10-27 DIAGNOSIS — M80051P Age-related osteoporosis with current pathological fracture, right femur, subsequent encounter for fracture with malunion: Secondary | ICD-10-CM | POA: Diagnosis not present

## 2020-10-27 DIAGNOSIS — M6281 Muscle weakness (generalized): Secondary | ICD-10-CM | POA: Diagnosis not present

## 2020-10-29 DIAGNOSIS — M80051P Age-related osteoporosis with current pathological fracture, right femur, subsequent encounter for fracture with malunion: Secondary | ICD-10-CM | POA: Diagnosis not present

## 2020-10-29 DIAGNOSIS — M6281 Muscle weakness (generalized): Secondary | ICD-10-CM | POA: Diagnosis not present

## 2020-10-30 DIAGNOSIS — M6281 Muscle weakness (generalized): Secondary | ICD-10-CM | POA: Diagnosis not present

## 2020-10-30 DIAGNOSIS — M80051P Age-related osteoporosis with current pathological fracture, right femur, subsequent encounter for fracture with malunion: Secondary | ICD-10-CM | POA: Diagnosis not present

## 2020-10-31 ENCOUNTER — Other Ambulatory Visit: Payer: Self-pay

## 2020-10-31 ENCOUNTER — Inpatient Hospital Stay (HOSPITAL_COMMUNITY): Payer: Medicare Other

## 2020-10-31 ENCOUNTER — Inpatient Hospital Stay (HOSPITAL_COMMUNITY): Payer: Medicare Other | Attending: Hematology | Admitting: Hematology

## 2020-10-31 VITALS — BP 135/66 | HR 90 | Temp 96.9°F | Resp 18 | Wt 253.0 lb

## 2020-10-31 DIAGNOSIS — Z9079 Acquired absence of other genital organ(s): Secondary | ICD-10-CM | POA: Diagnosis not present

## 2020-10-31 DIAGNOSIS — Z87891 Personal history of nicotine dependence: Secondary | ICD-10-CM | POA: Diagnosis not present

## 2020-10-31 DIAGNOSIS — C779 Secondary and unspecified malignant neoplasm of lymph node, unspecified: Secondary | ICD-10-CM | POA: Diagnosis not present

## 2020-10-31 DIAGNOSIS — Z809 Family history of malignant neoplasm, unspecified: Secondary | ICD-10-CM | POA: Insufficient documentation

## 2020-10-31 DIAGNOSIS — R300 Dysuria: Secondary | ICD-10-CM | POA: Diagnosis not present

## 2020-10-31 DIAGNOSIS — M6281 Muscle weakness (generalized): Secondary | ICD-10-CM | POA: Diagnosis not present

## 2020-10-31 DIAGNOSIS — Z79899 Other long term (current) drug therapy: Secondary | ICD-10-CM | POA: Diagnosis not present

## 2020-10-31 DIAGNOSIS — C7951 Secondary malignant neoplasm of bone: Secondary | ICD-10-CM

## 2020-10-31 DIAGNOSIS — N189 Chronic kidney disease, unspecified: Secondary | ICD-10-CM | POA: Insufficient documentation

## 2020-10-31 DIAGNOSIS — D649 Anemia, unspecified: Secondary | ICD-10-CM | POA: Diagnosis not present

## 2020-10-31 DIAGNOSIS — Z8042 Family history of malignant neoplasm of prostate: Secondary | ICD-10-CM | POA: Insufficient documentation

## 2020-10-31 DIAGNOSIS — Z803 Family history of malignant neoplasm of breast: Secondary | ICD-10-CM | POA: Diagnosis not present

## 2020-10-31 DIAGNOSIS — C61 Malignant neoplasm of prostate: Secondary | ICD-10-CM | POA: Diagnosis not present

## 2020-10-31 DIAGNOSIS — M80051P Age-related osteoporosis with current pathological fracture, right femur, subsequent encounter for fracture with malunion: Secondary | ICD-10-CM | POA: Diagnosis not present

## 2020-10-31 LAB — COMPREHENSIVE METABOLIC PANEL
ALT: 20 U/L (ref 0–44)
AST: 24 U/L (ref 15–41)
Albumin: 3.8 g/dL (ref 3.5–5.0)
Alkaline Phosphatase: 63 U/L (ref 38–126)
Anion gap: 10 (ref 5–15)
BUN: 22 mg/dL (ref 8–23)
CO2: 23 mmol/L (ref 22–32)
Calcium: 9.4 mg/dL (ref 8.9–10.3)
Chloride: 106 mmol/L (ref 98–111)
Creatinine, Ser: 1.69 mg/dL — ABNORMAL HIGH (ref 0.61–1.24)
GFR, Estimated: 40 mL/min — ABNORMAL LOW (ref >60)
Glucose, Bld: 124 mg/dL — ABNORMAL HIGH (ref 70–99)
Potassium: 4.2 mmol/L (ref 3.5–5.1)
Sodium: 139 mmol/L (ref 135–145)
Total Bilirubin: 0.5 mg/dL (ref 0.3–1.2)
Total Protein: 7.5 g/dL (ref 6.5–8.1)

## 2020-10-31 LAB — CBC WITH DIFFERENTIAL/PLATELET
Abs Immature Granulocytes: 0.02 10*3/uL (ref 0.00–0.07)
Basophils Absolute: 0 10*3/uL (ref 0.0–0.1)
Basophils Relative: 0 %
Eosinophils Absolute: 0.1 10*3/uL (ref 0.0–0.5)
Eosinophils Relative: 2 %
HCT: 36.5 % — ABNORMAL LOW (ref 39.0–52.0)
Hemoglobin: 11.4 g/dL — ABNORMAL LOW (ref 13.0–17.0)
Immature Granulocytes: 0 %
Lymphocytes Relative: 21 %
Lymphs Abs: 1.9 10*3/uL (ref 0.7–4.0)
MCH: 28 pg (ref 26.0–34.0)
MCHC: 31.2 g/dL (ref 30.0–36.0)
MCV: 89.7 fL (ref 80.0–100.0)
Monocytes Absolute: 0.4 10*3/uL (ref 0.1–1.0)
Monocytes Relative: 5 %
Neutro Abs: 6.9 10*3/uL (ref 1.7–7.7)
Neutrophils Relative %: 72 %
Platelets: 220 10*3/uL (ref 150–400)
RBC: 4.07 MIL/uL — ABNORMAL LOW (ref 4.22–5.81)
RDW: 16.3 % — ABNORMAL HIGH (ref 11.5–15.5)
WBC: 9.5 10*3/uL (ref 4.0–10.5)
nRBC: 0 % (ref 0.0–0.2)

## 2020-10-31 LAB — PSA: Prostatic Specific Antigen: 61.19 ng/mL — ABNORMAL HIGH (ref 0.00–4.00)

## 2020-10-31 NOTE — Patient Instructions (Signed)
[facility] at [facility] Discharge Instructions  You were seen today by Dr. Ellin Saba. He went over your recent results. Since you have not received any chemo since June 2021, you will be scheduled for a CT scan of your abdomen and a bone scan. Dr. Ellin Saba will see you back your scans for follow up.   Thank you for choosing [facility] at [facility] to provide your oncology and hematology care.  To afford each patient quality time with our provider, please arrive at least 15 minutes before your scheduled appointment time.   If you have a lab appointment with the Cancer Center please come in thru the Main Entrance and check in at the main information desk  You need to re-schedule your appointment should you arrive 10 or more minutes late.  We strive to give you quality time with our providers, and arriving late affects you and other patients whose appointments are after yours.  Also, if you no show three or more times for appointments you may be dismissed from the clinic at the providers discretion.     Again, thank you for choosing [facility].  Our hope is that these requests will decrease the amount of time that you wait before being seen by our physicians.       _____________________________________________________________  Should you have questions after your visit to [facility], please contact our office at [phone] between the hours of 8:00 a.m. and 4:30 p.m.  Voicemails left after 4:00 p.m. will not be returned until the following business day.  For prescription refill requests, have your pharmacy contact our office and allow 72 hours.    Cancer Center Support Programs:   > Cancer Support Group  2nd Tuesday of the month 1pm-2pm, Journey Room

## 2020-10-31 NOTE — Progress Notes (Signed)
[place] [address], [place] [address]   CLINIC:  Medical Oncology/Hematology  PCP:  Celene Squibb, MD [address] Liana Finley [place] [address] [phone]   REASON FOR VISIT:  Follow-up for metastatic prostate cancer to bone  PRIOR THERAPY:  1. Enzalutamide from 08/2018 to 06/2019. 2. Docetaxel x 11 cycles from 09/08/2019 to 04/12/2020.  NGS Results: Guardant 360   CURRENT THERAPY: Observation  BRIEF ONCOLOGIC HISTORY:  Oncology History  Prostate cancer metastatic to bone ([place])  04/04/2016 Initial Diagnosis   Prostate cancer metastatic to bone ([place])   07/14/2019 Genetic Testing   Negative genetic testing on the common hereditary cancer panel.  The Common Hereditary Gene Panel offered by [facility] includes sequencing and/or deletion duplication testing of the following 48 genes: APC, ATM, AXIN2, BARD1, BMPR1A, BRCA1, BRCA2, BRIP1, CDH1, CDK4, CDKN2A (p14ARF), CDKN2A (p16INK4a), CHEK2, CTNNA1, DICER1, EPCAM (Deletion/duplication testing only), GREM1 (promoter region deletion/duplication testing only), KIT, MEN1, MLH1, MSH2, MSH3, MSH6, MUTYH, NBN, NF1, NHTL1, PALB2, PDGFRA, PMS2, POLD1, POLE, PTEN, RAD50, RAD51C, RAD51D, RNF43, SDHB, SDHC, SDHD, SMAD4, SMARCA4. STK11, TP53, TSC1, TSC2, and VHL.  The following genes were evaluated for sequence changes only: SDHA and HOXB13 c.251G>A variant only. The report date is July 14, 2019.   09/08/2019 -  Chemotherapy   The patient had pegfilgrastim (NEULASTA) injection 6 mg, 6 mg, Subcutaneous, Once, 11 of 14 cycles Administration: 6 mg (09/09/2019), 6 mg (10/04/2019), 6 mg (10/31/2019), 6 mg (11/16/2019), 6 mg (12/07/2019), 6 mg (12/28/2019), 6 mg (01/17/2020), 6 mg (02/08/2020), 6 mg (02/29/2020), 6 mg (03/21/2020) ondansetron (ZOFRAN) 8 mg in sodium chloride 0.9 % 50 mL IVPB, 8 mg (100 % of original dose 8 mg), Intravenous,  Once, 1 of 1 cycle Dose modification: 8 mg (original dose 8 mg, Cycle 1) Administration: 8 mg  (09/08/2019) DOCEtaxel (TAXOTERE) 120 mg in sodium chloride 0.9 % 250 mL chemo infusion, 50 mg/m2 = 120 mg (66.7 % of original dose 75 mg/m2), Intravenous,  Once, 11 of 14 cycles Dose modification: 50 mg/m2 (66.7 % of original dose 75 mg/m2, Cycle 1, Reason: Provider Judgment) Administration: 120 mg (09/08/2019), 120 mg (09/30/2019), 120 mg (10/24/2019), 120 mg (11/14/2019), 120 mg (12/05/2019), 120 mg (12/26/2019), 120 mg (01/16/2020), 120 mg (02/06/2020), 120 mg (02/27/2020), 120 mg (03/19/2020), 120 mg (04/12/2020) ondansetron (ZOFRAN) 8 mg, dexamethasone (DECADRON) 10 mg in sodium chloride 0.9 % 50 mL IVPB, , Intravenous,  Once, 10 of 13 cycles Administration:  (09/30/2019),  (10/24/2019),  (11/14/2019),  (12/05/2019),  (12/26/2019),  (01/16/2020),  (02/06/2020),  (02/27/2020),  (03/19/2020),  (04/12/2020)  for chemotherapy treatment.      CANCER STAGING: Cancer Staging No matching staging information was found for the patient.  INTERVAL HISTORY:  [name], a 80 y.o. male, returns for routine follow-up of his metastatic prostate cancer to bone. [name] was last seen on 04/12/2020.   Today he is accompanied by his caretaker. He denies having any new pains. He needs to walk with a walker.  He lives in [place] admitted on 04/17/2020 after being taken to [facility]. His family comes to visit him occasionally.   REVIEW OF SYSTEMS:  Review of Systems  Constitutional: Positive for fatigue (90%). Negative for appetite change.  Genitourinary: Positive for difficulty urinating and dysuria.   All other systems reviewed and are negative.   PAST MEDICAL/SURGICAL HISTORY:  Past Medical History:  Diagnosis Date  . Anemia   . Cancer [facility])    prostate  .  Chronic kidney disease, stage 3a ([place])   . Dementia ([place])   . Family history of breast cancer   . Family history of prostate cancer   . GERD (gastroesophageal reflux disease)   . History of falling   . HOH (hard of hearing)   . Hyperlipidemia   .  Hypertension   . Overactive bladder   . Port-A-Cath in place 08/29/2019  . 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 Sleep apnea 10/24/2016   Past Surgical History:  Procedure Laterality Date  . CATARACT EXTRACTION W/PHACO Right 04/22/2019   Procedure: CATARACT EXTRACTION PHACO AND INTRAOCULAR LENS PLACEMENT RIGHT EYE;  Surgeon: Baruch Goldmann, MD;  Location: [facility];  Service: Ophthalmology;  Laterality: Right;  right  . CATARACT EXTRACTION W/PHACO Left 05/13/2019   Procedure: CATARACT EXTRACTION PHACO AND INTRAOCULAR LENS PLACEMENT LEFT EYE;  Surgeon: Baruch Goldmann, MD;  Location: [facility];  Service: Ophthalmology;  Laterality: Left;  left  . CHOLECYSTECTOMY    . CYSTOSCOPY W/ URETERAL STENT PLACEMENT Left 08/17/2019   Procedure: CYSTOSCOPY WITH RETROGRADE PYELOGRAM/URETERAL STENT PLACEMENT;  Surgeon: Cleon Gustin, MD;  Location: [facility];  Service: Urology;  Laterality: Left;  . HAND SURGERY    . INGUINAL HERNIA REPAIR Left   . PORTACATH PLACEMENT Left 09/07/2019   Procedure: INSERTION PORT-A-CATH;  Surgeon: Virl Cagey, MD;  Location: [facility];  Service: General;  Laterality: Left;  . PROSTATECTOMY    . PROSTATECTOMY    . radical prostatectomy    . TONSILLECTOMY      SOCIAL HISTORY:  Social History   Socioeconomic History  . Marital status: Widowed    Spouse name: Not on file  . Number of children: 3  . Years of education: 9  . Highest education level: Not on file  Occupational History  . Occupation: Animator- retired    Comment: [employer]  Tobacco Use  . Smoking status: Former Smoker    Packs/day: 1.00    Years: 32.00    Pack years: 32.00    Types: Cigarettes    Quit date: 02/04/1989    Years since quitting: 31.7  . Smokeless tobacco: Never Used  Vaping Use  . Vaping Use: Never used  Substance and Sexual Activity  . Alcohol use: No  . Drug use: No  . Sexual activity: Not Currently  Other Topics Concern  . Not on file  Social History  Narrative   Widowed   Loves with son   Chronic low back pain   Social Determinants of Health   Financial Resource Strain: Not on file  Food Insecurity: Not on file  Transportation Needs: Not on file  Physical Activity: Not on file  Stress: Not on file  Social Connections: Not on file  Intimate Partner Violence: Not on file    FAMILY HISTORY:  Family History  Problem Relation Age of Onset  . Heart failure Mother        CHF  . Cancer Mother        unknown  . Prostate cancer Father   . Narcolepsy Sister   . Cancer Daughter        unknown  . Breast cancer Maternal Aunt   . Cancer Maternal Grandmother        unknown  . Cancer Maternal Aunt        unknown type of cancer    CURRENT MEDICATIONS:  Current Outpatient Medications  Medication Sig Dispense Refill  . senna-docusate (SENOKOT-S) 8.6-50 MG tablet Take by  mouth.    . acetaminophen (TYLENOL) 650 MG CR tablet Take by mouth.    . calcium-vitamin D (OSCAL WITH D) 500-200 MG-UNIT TABS tablet Take 1 tablet by mouth daily.  3  . cetirizine (ZYRTEC) 10 MG tablet TAKE ONE TABLET BY MOUTH DAILY. 30 tablet 0  . Cholecalciferol (VITAMIN D3) 25 MCG (1000 UT) CHEW Chew by mouth daily.    . DOCETAXEL IV Inject into the vein every 21 ( twenty-one) days.    . fluticasone (FLONASE) 50 MCG/ACT nasal spray Place into the nose.    . furosemide (LASIX) 40 MG tablet TAKE ONE TABLET BY MOUTH DAILY. TAKE WITH POTASSIUM. 30 tablet 5  . HYDROcodone-acetaminophen (NORCO/VICODIN) 5-325 MG tablet Take by mouth.    . lidocaine-prilocaine (EMLA) cream Apply small amount to port a cath site and cover with plastic wrap 1 hour prior to chemotherapy appointments (Patient not taking: Reported on 04/12/2020) 30 g 0  . losartan (COZAAR) 100 MG tablet Take 100 mg by mouth daily.  5  . methylphenidate (RITALIN) 20 MG tablet Take 20 mg by mouth daily.  0  . mirabegron ER (MYRBETRIQ) 25 MG TB24 tablet Take by mouth.    . potassium chloride (KLOR-CON) 20 MEQ  packet Take by mouth.    . prochlorperazine (COMPAZINE) 10 MG tablet Take 1 tablet (10 mg total) by mouth every 6 (six) hours as needed for nausea or vomiting. (Patient not taking: Reported on 04/12/2020) 30 tablet 0   No current facility-administered medications for this visit.    ALLERGIES:  No Known Allergies  PHYSICAL EXAM:  Performance status (ECOG): 1 - Symptomatic but completely ambulatory  Vitals:   10/31/20 1503  BP: 135/66  Pulse: 90  Resp: 18  Temp: (!) 96.9 F (36.1 C)  SpO2: 99%   Wt Readings from Last 3 Encounters:  10/31/20 253 lb (114.8 kg)  10/08/20 255 lb 8.2 oz (115.9 kg)  04/12/20 255 lb 9.6 oz (115.9 kg)   Physical Exam Vitals reviewed.  Constitutional:      Appearance: Normal appearance. He is obese.  Neurological:     General: No focal deficit present.     Mental Status: He is alert and oriented to person, place, and time.  Psychiatric:        Mood and Affect: Mood normal.        Behavior: Behavior normal.      LABORATORY DATA:  I have reviewed the labs as listed.  CBC Latest Ref Rng & Units 10/31/2020 10/08/2020 04/12/2020  WBC 4.0 - 10.5 K/uL 9.5 7.1 8.1  Hemoglobin 13.0 - 17.0 g/dL 11.4(L) 9.4(L) 9.9(L)  Hematocrit 39.0 - 52.0 % 36.5(L) 30.0(L) 31.7(L)  Platelets 150 - 400 K/uL 220 214 214   CMP Latest Ref Rng & Units 10/31/2020 10/08/2020 04/12/2020  Glucose 70 - 99 mg/dL 124(H) 99 91  BUN 8 - 23 mg/dL 22 16 17   Creatinine 0.61 - 1.24 mg/dL 1.69(H) 1.56(H) 1.67(H)  Sodium 135 - 145 mmol/L 139 137 139  Potassium 3.5 - 5.1 mmol/L 4.2 4.0 3.9  Chloride 98 - 111 mmol/L 106 103 101  CO2 22 - 32 mmol/L 23 26 26   Calcium 8.9 - 10.3 mg/dL 9.4 9.3 9.1  Total Protein 6.5 - 8.1 g/dL 7.5 - 6.9  Total Bilirubin 0.3 - 1.2 mg/dL 0.5 - 0.7  Alkaline Phos 38 - 126 U/L 63 - 76  AST 15 - 41 U/L 24 - 22  ALT 0 - 44 U/L 20 - 17  DIAGNOSTIC IMAGING:  I have independently reviewed the scans and discussed with the patient. CT Head Wo Contrast  Result  Date: 10/08/2020 CLINICAL DATA:  Combative and agitated EXAM: CT HEAD WITHOUT CONTRAST TECHNIQUE: Contiguous axial images were obtained from the base of the skull through the vertex without intravenous contrast. COMPARISON:  CT brain 04/12/2020 FINDINGS: Brain: No acute territorial infarction, hemorrhage or intracranial mass. Moderate atrophy. Moderate hypodensity in the white matter consistent with chronic small vessel ischemic change. Stable ventricle size. Vascular: No hyperdense vessels.  No unexpected calcification Skull: Normal. Negative for fracture or focal lesion. Sinuses/Orbits: Mucosal thickening in the sphenoid sinus and ethmoid sinuses. Other: None IMPRESSION: 1. No CT evidence for acute intracranial abnormality. 2. Atrophy and chronic small vessel ischemic changes of the white matter. Electronically Signed   By: Donavan Foil M.D.   On: 10/08/2020 18:59     ASSESSMENT:  1. 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 -Germline mutation testing negative. Guardant 360 was negative for any actionable mutations. -11 cycles of docetaxel from 09/16/2019 through 04/12/2020. -CTAP on 12/01/2019 showed mild enlargement of the left iliac adenopathy measuring 6.4 x 4 cm, previously 5.6 x 3.77. This was compared to CT scan from September 2020. -Chemotherapy was held as he sustained right proximal femur fracture and transition to [place] home.  His sister is his healthcare power of attorney.   PLAN:  1. 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 -Last PSA was 7.05 on 03/19/2020, up from 5.523 weeks prior to that. -We have sent a PSA level today.  Calcium today is normal. -He does not report any new onset pains. -Recommended CT of the abdomen and pelvis and bone scan for restaging.  2. Bone metastasis: -Last denosumab  on 03/21/2020.  3. CKD: -Baseline creatinine 1.5-1.9.  Today creatinine 1.69.  4. Normocytic anemia: -Hemoglobin improved 11.4 today.   Orders placed this encounter:  No orders of the defined types were placed in this encounter.    Derek Jack, MD [place] [phone]   I, Milinda Antis, am acting as a scribe for Dr. Sanda Linger.  I, Derek Jack MD, have reviewed the above documentation for accuracy and completeness, and I agree with the above. '

## 2020-11-01 DIAGNOSIS — M6281 Muscle weakness (generalized): Secondary | ICD-10-CM | POA: Diagnosis not present

## 2020-11-01 DIAGNOSIS — M80051P Age-related osteoporosis with current pathological fracture, right femur, subsequent encounter for fracture with malunion: Secondary | ICD-10-CM | POA: Diagnosis not present

## 2020-11-02 DIAGNOSIS — M6281 Muscle weakness (generalized): Secondary | ICD-10-CM | POA: Diagnosis not present

## 2020-11-02 DIAGNOSIS — M80051P Age-related osteoporosis with current pathological fracture, right femur, subsequent encounter for fracture with malunion: Secondary | ICD-10-CM | POA: Diagnosis not present

## 2020-11-05 DIAGNOSIS — M80051P Age-related osteoporosis with current pathological fracture, right femur, subsequent encounter for fracture with malunion: Secondary | ICD-10-CM | POA: Diagnosis not present

## 2020-11-05 DIAGNOSIS — M6281 Muscle weakness (generalized): Secondary | ICD-10-CM | POA: Diagnosis not present

## 2020-11-06 DIAGNOSIS — M6281 Muscle weakness (generalized): Secondary | ICD-10-CM | POA: Diagnosis not present

## 2020-11-06 DIAGNOSIS — M80051P Age-related osteoporosis with current pathological fracture, right femur, subsequent encounter for fracture with malunion: Secondary | ICD-10-CM | POA: Diagnosis not present

## 2020-11-07 ENCOUNTER — Other Ambulatory Visit: Payer: Self-pay

## 2020-11-07 ENCOUNTER — Encounter (HOSPITAL_COMMUNITY): Payer: Self-pay

## 2020-11-07 ENCOUNTER — Encounter (HOSPITAL_COMMUNITY)
Admission: RE | Admit: 2020-11-07 | Discharge: 2020-11-07 | Disposition: A | Discharge status: Home / Self Care | Payer: Medicare Other | Source: Ambulatory Visit | Attending: Hematology | Admitting: Hematology

## 2020-11-07 DIAGNOSIS — C61 Malignant neoplasm of prostate: Secondary | ICD-10-CM | POA: Diagnosis not present

## 2020-11-07 DIAGNOSIS — M6281 Muscle weakness (generalized): Secondary | ICD-10-CM | POA: Diagnosis not present

## 2020-11-07 DIAGNOSIS — M80051P Age-related osteoporosis with current pathological fracture, right femur, subsequent encounter for fracture with malunion: Secondary | ICD-10-CM | POA: Diagnosis not present

## 2020-11-07 DIAGNOSIS — C7951 Secondary malignant neoplasm of bone: Secondary | ICD-10-CM | POA: Insufficient documentation

## 2020-11-07 MED ORDER — TECHNETIUM TC 99M MEDRONATE IV KIT
20.0000 | PACK | Freq: Once | INTRAVENOUS | Status: AC | PRN
  Administered 2020-11-07: 18.5 via INTRAVENOUS

## 2020-11-08 DIAGNOSIS — M6281 Muscle weakness (generalized): Secondary | ICD-10-CM | POA: Diagnosis not present

## 2020-11-08 DIAGNOSIS — M80051P Age-related osteoporosis with current pathological fracture, right femur, subsequent encounter for fracture with malunion: Secondary | ICD-10-CM | POA: Diagnosis not present

## 2020-11-09 DIAGNOSIS — M6281 Muscle weakness (generalized): Secondary | ICD-10-CM | POA: Diagnosis not present

## 2020-11-09 DIAGNOSIS — M80051P Age-related osteoporosis with current pathological fracture, right femur, subsequent encounter for fracture with malunion: Secondary | ICD-10-CM | POA: Diagnosis not present

## 2020-11-13 DIAGNOSIS — M80051P Age-related osteoporosis with current pathological fracture, right femur, subsequent encounter for fracture with malunion: Secondary | ICD-10-CM | POA: Diagnosis not present

## 2020-11-13 DIAGNOSIS — M6281 Muscle weakness (generalized): Secondary | ICD-10-CM | POA: Diagnosis not present

## 2020-11-14 DIAGNOSIS — M6281 Muscle weakness (generalized): Secondary | ICD-10-CM | POA: Diagnosis not present

## 2020-11-14 DIAGNOSIS — M80051P Age-related osteoporosis with current pathological fracture, right femur, subsequent encounter for fracture with malunion: Secondary | ICD-10-CM | POA: Diagnosis not present

## 2020-11-15 DIAGNOSIS — M80051P Age-related osteoporosis with current pathological fracture, right femur, subsequent encounter for fracture with malunion: Secondary | ICD-10-CM | POA: Diagnosis not present

## 2020-11-15 DIAGNOSIS — M6281 Muscle weakness (generalized): Secondary | ICD-10-CM | POA: Diagnosis not present

## 2020-11-16 DIAGNOSIS — M80051P Age-related osteoporosis with current pathological fracture, right femur, subsequent encounter for fracture with malunion: Secondary | ICD-10-CM | POA: Diagnosis not present

## 2020-11-16 DIAGNOSIS — M6281 Muscle weakness (generalized): Secondary | ICD-10-CM | POA: Diagnosis not present

## 2020-11-17 DIAGNOSIS — M80051P Age-related osteoporosis with current pathological fracture, right femur, subsequent encounter for fracture with malunion: Secondary | ICD-10-CM | POA: Diagnosis not present

## 2020-11-17 DIAGNOSIS — M6281 Muscle weakness (generalized): Secondary | ICD-10-CM | POA: Diagnosis not present

## 2020-11-19 DIAGNOSIS — M6281 Muscle weakness (generalized): Secondary | ICD-10-CM | POA: Diagnosis not present

## 2020-11-19 DIAGNOSIS — M80051P Age-related osteoporosis with current pathological fracture, right femur, subsequent encounter for fracture with malunion: Secondary | ICD-10-CM | POA: Diagnosis not present

## 2020-11-20 DIAGNOSIS — M80051P Age-related osteoporosis with current pathological fracture, right femur, subsequent encounter for fracture with malunion: Secondary | ICD-10-CM | POA: Diagnosis not present

## 2020-11-20 DIAGNOSIS — M6281 Muscle weakness (generalized): Secondary | ICD-10-CM | POA: Diagnosis not present

## 2020-11-21 DIAGNOSIS — M6281 Muscle weakness (generalized): Secondary | ICD-10-CM | POA: Diagnosis not present

## 2020-11-21 DIAGNOSIS — M80051P Age-related osteoporosis with current pathological fracture, right femur, subsequent encounter for fracture with malunion: Secondary | ICD-10-CM | POA: Diagnosis not present

## 2020-11-22 DIAGNOSIS — M6281 Muscle weakness (generalized): Secondary | ICD-10-CM | POA: Diagnosis not present

## 2020-11-22 DIAGNOSIS — M80051P Age-related osteoporosis with current pathological fracture, right femur, subsequent encounter for fracture with malunion: Secondary | ICD-10-CM | POA: Diagnosis not present

## 2020-11-23 ENCOUNTER — Other Ambulatory Visit: Payer: Self-pay

## 2020-11-23 ENCOUNTER — Ambulatory Visit (HOSPITAL_COMMUNITY)
Admission: RE | Admit: 2020-11-23 | Discharge: 2020-11-23 | Disposition: A | Discharge status: Home / Self Care | Payer: Medicare Other | Source: Ambulatory Visit | Attending: Hematology | Admitting: Hematology

## 2020-11-23 DIAGNOSIS — M6281 Muscle weakness (generalized): Secondary | ICD-10-CM | POA: Diagnosis not present

## 2020-11-23 DIAGNOSIS — C7951 Secondary malignant neoplasm of bone: Secondary | ICD-10-CM | POA: Diagnosis not present

## 2020-11-23 DIAGNOSIS — C61 Malignant neoplasm of prostate: Secondary | ICD-10-CM | POA: Diagnosis present

## 2020-11-23 DIAGNOSIS — N281 Cyst of kidney, acquired: Secondary | ICD-10-CM | POA: Diagnosis not present

## 2020-11-23 DIAGNOSIS — N261 Atrophy of kidney (terminal): Secondary | ICD-10-CM | POA: Diagnosis not present

## 2020-11-23 DIAGNOSIS — M80051P Age-related osteoporosis with current pathological fracture, right femur, subsequent encounter for fracture with malunion: Secondary | ICD-10-CM | POA: Diagnosis not present

## 2020-11-23 MED ORDER — IOHEXOL 300 MG/ML  SOLN
75.0000 mL | Freq: Once | INTRAMUSCULAR | Status: AC | PRN
  Administered 2020-11-23: 75 mL via INTRAVENOUS

## 2020-11-26 DIAGNOSIS — M80051P Age-related osteoporosis with current pathological fracture, right femur, subsequent encounter for fracture with malunion: Secondary | ICD-10-CM | POA: Diagnosis not present

## 2020-11-26 DIAGNOSIS — M6281 Muscle weakness (generalized): Secondary | ICD-10-CM | POA: Diagnosis not present

## 2020-11-27 ENCOUNTER — Other Ambulatory Visit: Payer: Self-pay

## 2020-11-27 ENCOUNTER — Inpatient Hospital Stay (HOSPITAL_COMMUNITY): Payer: Medicare Other | Attending: Hematology | Admitting: Hematology

## 2020-11-27 ENCOUNTER — Other Ambulatory Visit (HOSPITAL_COMMUNITY): Payer: Self-pay

## 2020-11-27 VITALS — BP 138/64 | HR 86 | Temp 97.2°F | Resp 18 | Wt 244.2 lb

## 2020-11-27 DIAGNOSIS — Z803 Family history of malignant neoplasm of breast: Secondary | ICD-10-CM | POA: Diagnosis not present

## 2020-11-27 DIAGNOSIS — Z809 Family history of malignant neoplasm, unspecified: Secondary | ICD-10-CM | POA: Diagnosis not present

## 2020-11-27 DIAGNOSIS — C61 Malignant neoplasm of prostate: Secondary | ICD-10-CM

## 2020-11-27 DIAGNOSIS — Z5111 Encounter for antineoplastic chemotherapy: Secondary | ICD-10-CM | POA: Insufficient documentation

## 2020-11-27 DIAGNOSIS — Z79899 Other long term (current) drug therapy: Secondary | ICD-10-CM | POA: Diagnosis not present

## 2020-11-27 DIAGNOSIS — N390 Urinary tract infection, site not specified: Secondary | ICD-10-CM | POA: Diagnosis not present

## 2020-11-27 DIAGNOSIS — C779 Secondary and unspecified malignant neoplasm of lymph node, unspecified: Secondary | ICD-10-CM | POA: Diagnosis not present

## 2020-11-27 DIAGNOSIS — N189 Chronic kidney disease, unspecified: Secondary | ICD-10-CM | POA: Insufficient documentation

## 2020-11-27 DIAGNOSIS — Z9079 Acquired absence of other genital organ(s): Secondary | ICD-10-CM | POA: Diagnosis not present

## 2020-11-27 DIAGNOSIS — C7951 Secondary malignant neoplasm of bone: Secondary | ICD-10-CM | POA: Diagnosis not present

## 2020-11-27 DIAGNOSIS — M80051P Age-related osteoporosis with current pathological fracture, right femur, subsequent encounter for fracture with malunion: Secondary | ICD-10-CM | POA: Diagnosis not present

## 2020-11-27 DIAGNOSIS — Z87891 Personal history of nicotine dependence: Secondary | ICD-10-CM | POA: Insufficient documentation

## 2020-11-27 DIAGNOSIS — M6281 Muscle weakness (generalized): Secondary | ICD-10-CM | POA: Diagnosis not present

## 2020-11-27 LAB — URINALYSIS, ROUTINE W REFLEX MICROSCOPIC
Bilirubin Urine: NEGATIVE
Glucose, UA: NEGATIVE mg/dL
Ketones, ur: NEGATIVE mg/dL
Nitrite: NEGATIVE
Protein, ur: 100 mg/dL — AB
Specific Gravity, Urine: 1.016 (ref 1.005–1.030)
WBC, UA: >50 WBC/hpf — ABNORMAL HIGH (ref 0–5)
pH: 5 (ref 5.0–8.0)

## 2020-11-27 NOTE — Patient Instructions (Signed)
[address] at [facility] Discharge Instructions  You were seen today by Dr. Delton Coombes. He went over your recent results and scans; your prostate cancer is starting to grow back. You will be started on Lupron injections, which are given every 6 months, to control the cancer growth. Dr. Delton Coombes will see you back in 4 months for labs and follow up.   Thank you for choosing [place] at [facility] to provide your oncology and hematology care.  To afford each patient quality time with our provider, please arrive at least 15 minutes before your scheduled appointment time.   If you have a lab appointment with the [facility] please come in thru the Main Entrance and check in at the main information desk  You need to re-schedule your appointment should you arrive 10 or more minutes late.  We strive to give you quality time with our providers, and arriving late affects you and other patients whose appointments are after yours.  Also, if you no show three or more times for appointments you may be dismissed from the clinic at the providers discretion.     Again, thank you for choosing [facility].  Our hope is that these requests will decrease the amount of time that you wait before being seen by our physicians.       _____________________________________________________________  Should you have questions after your visit to [facility], please contact our office at [phone] between the hours of 8:00 a.m. and 4:30 p.m.  Voicemails left after 4:00 p.m. will not be returned until the following business day.  For prescription refill requests, have your pharmacy contact our office and allow 72 hours.    Cancer Center Support Programs:   > Cancer Support Group  2nd Tuesday of the month 1pm-2pm, Journey Room

## 2020-11-27 NOTE — Progress Notes (Signed)
[facility] [address] [place] [address]   CLINIC:  Medical Oncology/Hematology  PCP:  Celene Squibb, MD [address] 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 Enzalutamide from 08/2018 to 06/2019. 2. Docetaxel x 11 cycles from 09/08/2019 to 04/12/2020.  NGS Results: Guardant 360 negative for mutations  CURRENT THERAPY: Observation  BRIEF ONCOLOGIC HISTORY:  Oncology History  Prostate cancer metastatic to bone ([place])  04/04/2016 Initial Diagnosis   Prostate cancer metastatic to bone ([place])   07/14/2019 Genetic Testing   Negative genetic testing on the common hereditary cancer panel.  The Common Hereditary Gene Panel offered by [facility] includes sequencing and/or deletion duplication testing of the following 48 genes: APC, ATM, AXIN2, BARD1, BMPR1A, BRCA1, BRCA2, BRIP1, CDH1, CDK4, CDKN2A (p14ARF), CDKN2A (p16INK4a), CHEK2, CTNNA1, DICER1, EPCAM (Deletion/duplication testing only), GREM1 (promoter region deletion/duplication testing only), KIT, MEN1, MLH1, MSH2, MSH3, MSH6, MUTYH, NBN, NF1, NHTL1, PALB2, PDGFRA, PMS2, POLD1, POLE, PTEN, RAD50, RAD51C, RAD51D, RNF43, SDHB, SDHC, SDHD, SMAD4, SMARCA4. STK11, TP53, TSC1, TSC2, and VHL.  The following genes were evaluated for sequence changes only: SDHA and HOXB13 c.251G>A variant only. The report date is July 14, 2019.   09/08/2019 -  Chemotherapy   The patient had pegfilgrastim (NEULASTA) injection 6 mg, 6 mg, Subcutaneous, Once, 11 of 14 cycles Administration: 6 mg (09/09/2019), 6 mg (10/04/2019), 6 mg (10/31/2019), 6 mg (11/16/2019), 6 mg (12/07/2019), 6 mg (12/28/2019), 6 mg (01/17/2020), 6 mg (02/08/2020), 6 mg (02/29/2020), 6 mg (03/21/2020) ondansetron (ZOFRAN) 8 mg in sodium chloride 0.9 % 50 mL IVPB, 8 mg (100 % of original dose 8 mg), Intravenous,  Once, 1 of 1 cycle Dose modification: 8 mg (original dose 8 mg, Cycle  1) Administration: 8 mg (09/08/2019) DOCEtaxel (TAXOTERE) 120 mg in sodium chloride 0.9 % 250 mL chemo infusion, 50 mg/m2 = 120 mg (66.7 % of original dose 75 mg/m2), Intravenous,  Once, 11 of 14 cycles Dose modification: 50 mg/m2 (66.7 % of original dose 75 mg/m2, Cycle 1, Reason: Provider Judgment) Administration: 120 mg (09/08/2019), 120 mg (09/30/2019), 120 mg (10/24/2019), 120 mg (11/14/2019), 120 mg (12/05/2019), 120 mg (12/26/2019), 120 mg (01/16/2020), 120 mg (02/06/2020), 120 mg (02/27/2020), 120 mg (03/19/2020), 120 mg (04/12/2020) ondansetron (ZOFRAN) 8 mg, dexamethasone (DECADRON) 10 mg in sodium chloride 0.9 % 50 mL IVPB, , Intravenous,  Once, 10 of 13 cycles Administration:  (09/30/2019),  (10/24/2019),  (11/14/2019),  (12/05/2019),  (12/26/2019),  (01/16/2020),  (02/06/2020),  (02/27/2020),  (03/19/2020),  (04/12/2020)  for chemotherapy treatment.      CANCER STAGING: Cancer Staging No matching staging information was found for the patient.  INTERVAL HISTORY:  [name], a 80 y.o. male, returns for routine follow-up of his metastatic prostate cancer to bone. [name] was last seen on 10/31/2020.   Today he is accompanied by his caretaker and he reports feeling okay. He complains of burning with urination, but denies having to strain to urinate.  He is still living in [place] in [place]. He has family members coming to visit him once a week.   REVIEW OF SYSTEMS:  Review of Systems  Constitutional: Positive for appetite change (75%) and fatigue (50%).  HENT:   Positive for hearing loss.   Genitourinary: Positive for dysuria (burning). Negative for difficulty urinating.   Neurological: Positive for dizziness.  All other systems reviewed and are negative.  PAST MEDICAL/SURGICAL HISTORY:  Past Medical History:  Diagnosis Date  . Anemia   . Cancer [facility])    prostate  . Chronic kidney disease, stage 3a ([place])   . Dementia ([place])   . Family history of breast cancer   . Family history  of prostate cancer   . GERD (gastroesophageal reflux disease)   . History of falling   . HOH (hard of hearing)   . Hyperlipidemia   . Hypertension   . Overactive bladder   . Port-A-Cath in place 08/29/2019  . Prostate cancer metastatic to bone ([place]) 04/04/2016  . Sleep apnea 10/24/2016   Past Surgical History:  Procedure Laterality Date  . CATARACT EXTRACTION W/PHACO Right 04/22/2019   Procedure: CATARACT EXTRACTION PHACO AND INTRAOCULAR LENS PLACEMENT RIGHT EYE;  Surgeon: Baruch Goldmann, MD;  Location: [facility];  Service: Ophthalmology;  Laterality: Right;  right  . CATARACT EXTRACTION W/PHACO Left 05/13/2019   Procedure: CATARACT EXTRACTION PHACO AND INTRAOCULAR LENS PLACEMENT LEFT EYE;  Surgeon: Baruch Goldmann, MD;  Location: [facility];  Service: Ophthalmology;  Laterality: Left;  left  . CHOLECYSTECTOMY    . CYSTOSCOPY W/ URETERAL STENT PLACEMENT Left 08/17/2019   Procedure: CYSTOSCOPY WITH RETROGRADE PYELOGRAM/URETERAL STENT PLACEMENT;  Surgeon: Cleon Gustin, MD;  Location: [facility];  Service: Urology;  Laterality: Left;  . HAND SURGERY    . INGUINAL HERNIA REPAIR Left   . PORTACATH PLACEMENT Left 09/07/2019   Procedure: INSERTION PORT-A-CATH;  Surgeon: Virl Cagey, MD;  Location: [facility];  Service: General;  Laterality: Left;  . PROSTATECTOMY    . PROSTATECTOMY    . radical prostatectomy    . TONSILLECTOMY      SOCIAL HISTORY:  Social History   Socioeconomic History  . Marital status: Widowed    Spouse name: Not on file  . Number of children: 3  . Years of education: 9  . Highest education level: Not on file  Occupational History  . Occupation: Animator- retired    Comment: [employer]  Tobacco Use  . Smoking status: Former Smoker    Packs/day: 1.00    Years: 32.00    Pack years: 32.00    Types: Cigarettes    Quit date: 02/04/1989    Years since quitting: 31.8  . Smokeless tobacco: Never Used  Vaping Use  . Vaping Use: Never used  Substance and Sexual  Activity  . Alcohol use: No  . Drug use: No  . Sexual activity: Not Currently  Other Topics Concern  . Not on file  Social History Narrative   Widowed   Loves with son   Chronic low back pain   Social Determinants of Health   Financial Resource Strain: Not on file  Food Insecurity: Not on file  Transportation Needs: Not on file  Physical Activity: Not on file  Stress: Not on file  Social Connections: Not on file  Intimate Partner Violence: Not on file    FAMILY HISTORY:  Family History  Problem Relation Age of Onset  . Heart failure Mother        CHF  . Cancer Mother        unknown  . Prostate cancer Father   . Narcolepsy Sister   . Cancer Daughter        unknown  . Breast cancer Maternal Aunt   . Cancer Maternal Grandmother        unknown  . Cancer Maternal Aunt        unknown type of  cancer    CURRENT MEDICATIONS:  Current Outpatient Medications  Medication Sig Dispense Refill  . acetaminophen (TYLENOL) 650 MG CR tablet Take by mouth.    . calcium-vitamin D (OSCAL WITH D) 500-200 MG-UNIT TABS tablet Take 1 tablet by mouth daily.  3  . cetirizine (ZYRTEC) 10 MG tablet TAKE ONE TABLET BY MOUTH DAILY. 30 tablet 0  . Cholecalciferol (VITAMIN D3) 25 MCG (1000 UT) CHEW Chew by mouth daily.    . DOCETAXEL IV Inject into the vein every 21 ( twenty-one) days.    . fluticasone (FLONASE) 50 MCG/ACT nasal spray Place into the nose.    . furosemide (LASIX) 40 MG tablet TAKE ONE TABLET BY MOUTH DAILY. TAKE WITH POTASSIUM. 30 tablet 5  . HYDROcodone-acetaminophen (NORCO/VICODIN) 5-325 MG tablet Take by mouth.    . lidocaine-prilocaine (EMLA) cream Apply small amount to port a cath site and cover with plastic wrap 1 hour prior to chemotherapy appointments (Patient not taking: Reported on 04/12/2020) 30 g 0  . losartan (COZAAR) 100 MG tablet Take 100 mg by mouth daily.  5  . methylphenidate (RITALIN) 20 MG tablet Take 20 mg by mouth daily.  0  . mirabegron ER (MYRBETRIQ) 25 MG  TB24 tablet Take by mouth.    . potassium chloride (KLOR-CON) 20 MEQ packet Take by mouth.    . prochlorperazine (COMPAZINE) 10 MG tablet Take 1 tablet (10 mg total) by mouth every 6 (six) hours as needed for nausea or vomiting. (Patient not taking: Reported on 04/12/2020) 30 tablet 0  . senna-docusate (SENOKOT-S) 8.6-50 MG tablet Take by mouth.     No current facility-administered medications for this visit.    ALLERGIES:  No Known Allergies  PHYSICAL EXAM:  Performance status (ECOG): 1 - Symptomatic but completely ambulatory  Vitals:   11/27/20 1433  BP: 138/64  Pulse: 86  Resp: 18  Temp: (!) 97.2 F (36.2 C)  SpO2: 100%   Wt Readings from Last 3 Encounters:  11/27/20 244 lb 3.2 oz (110.8 kg)  10/31/20 253 lb (114.8 kg)  10/08/20 255 lb 8.2 oz (115.9 kg)   Physical Exam Vitals reviewed.  Constitutional:      Appearance: Normal appearance.     Comments: In wheelchair  HENT:     Right Ear: Decreased hearing noted.     Left Ear: Decreased hearing noted.  Neurological:     General: No focal deficit present.     Mental Status: He is alert and oriented to person, place, and time.  Psychiatric:        Mood and Affect: Mood normal.        Behavior: Behavior normal.      LABORATORY DATA:  I have reviewed the labs as listed.  CBC Latest Ref Rng & Units 10/31/2020 10/08/2020 04/12/2020  WBC 4.0 - 10.5 K/uL 9.5 7.1 8.1  Hemoglobin 13.0 - 17.0 g/dL 11.4(L) 9.4(L) 9.9(L)  Hematocrit 39.0 - 52.0 % 36.5(L) 30.0(L) 31.7(L)  Platelets 150 - 400 K/uL 220 214 214   CMP Latest Ref Rng & Units 10/31/2020 10/08/2020 04/12/2020  Glucose 70 - 99 mg/dL 124(H) 99 91  BUN 8 - 23 mg/dL _0 Creatinine 0.61 - 1.24 mg/dL 1.69(H) 1.56(H) 1.67(H)  Sodium 135 - 145 mmol/L 139 137 139  Potassium 3.5 - 5.1 mmol/L 4.2 4.0 3.9  Chloride 98 - 111 mmol/L 106 103 101  CO2 22 - 32 mmol/L _1 Calcium 8.9 - 10.3 mg/dL 9.4 9.3 9.1  Total Protein 6.5 - 8.1 g/dL 7.5 - 6.9  Total Bilirubin 0.3 -  1.2 mg/dL 0.5 - 0.7  Alkaline Phos 38 - 126 U/L 63 - 76  AST 15 - 41 U/L 24 - 22  ALT 0 - 44 U/L 20 - 17   PSA 61.19 (H) 10/31/2020  PSA 7.05 (H) 03/19/2020  PSA 5.52 (H) 02/27/2020    DIAGNOSTIC IMAGING:  I have independently reviewed the scans and discussed with the patient. NM Bone Scan Whole Body  Result Date: 11/07/2020 CLINICAL DATA:  Metastatic prostate cancer to bone EXAM: NUCLEAR MEDICINE WHOLE BODY BONE SCAN TECHNIQUE: Whole body anterior and posterior images were obtained approximately 3 hours after intravenous injection of radiopharmaceutical. RADIOPHARMACEUTICALS:  18.5 mCi Technetium-21mMDP IV COMPARISON:  11/24/2019 Correlation: CT abdomen and pelvis 12/01/2019 FINDINGS: Increased soft tissue background of tracer with suboptimal target to background ratio, greater than on previous exam, decreasing sensitivity of study; this can be seen with poor bone turnover, renal failure, in patients with elevated aluminum levels or iron overload, some medications. Questionable increased tracer uptake in the inferior sternum versus increased blood pool. Abnormal tracer accumulation at the proximal RIGHT femur, uncertain if represents true osseous uptake or potentially artifact from urinary contamination on skin/clothing. Uptake at multiple thoracic costovertebral junctions, cervical spine, and laterally in the lumbar spine bilaterally, pattern likely degenerative. No other worrisome sites of tracer uptake. Minimally prominent LEFT renal collecting system. IMPRESSION: Poor target to background ratio decreases sensitivity of exam as discussed above. Questionable foci of increased tracer uptake at the inferior sternum and at the proximal RIGHT femur, versus artifacts; dedicated RIGHT femoral radiographs radiographs recommended to exclude proximal femoral diaphyseal lesion. Electronically Signed   By: MLavonia DanaM.D.   On: 11/07/2020 14:55   CT ABDOMEN PELVIS W CONTRAST  Result Date:  11/23/2020 CLINICAL DATA:  Restaging metastatic prostate cancer, prior chemotherapy and immunotherapy EXAM: CT ABDOMEN AND PELVIS WITH CONTRAST TECHNIQUE: Multidetector CT imaging of the abdomen and pelvis was performed using the standard protocol following bolus administration of intravenous contrast. CONTRAST:  751mOMNIPAQUE IOHEXOL 300 MG/ML  SOLN COMPARISON:  Bone scan 11/07/2020 and CT abdomen from 12/01/2019 FINDINGS: Lower chest: Unremarkable Hepatobiliary: Cholecystectomy. No significant hepatic lesion identified. No significant biliary dilatation. Pancreas: Unremarkable Spleen: Unremarkable Adrenals/Urinary Tract: Both adrenal glands appear normal. Atrophic left kidney with associated scarring. Stable scarring in the right kidney upper pole. Stable right kidney lower pole cyst. Stable small cystic lesions of the left kidney lower pole. Left double-J ureteral stent with proximal loop in the renal pelvis and distal loop in the urinary bladder, this stent traverses a large conglomerate left iliac nodal mass. There is little in the way of excreted contrast on the delayed images indicating poor function of the atrophic left kidney. Urinary bladder wall thickening is present and may be partially from nondistention but cystitis is a possibility. Stomach/Bowel: Equivocal rectal wall thickening, some of this could be from prior radiation therapy, correlate with patient history. Mild presacral edema is nonspecific but could also be therapy related. Vascular/Lymphatic: Aortoiliac atherosclerotic vascular disease. The large conglomerate left iliac nodal mass measures 8.9 by 7.3 cm on image 66 of series 2, formerly 6.4 by 4.0 cm, substantially increased in size. Reproductive: Prostatectomy. Other: Mild increase in perirectal and presacral edema/stranding. Musculoskeletal: Right femoral IM nail with 2 screws extending into the right femoral head, the nail traverses a subtrochanteric fracture with callus formation along  its margins likely corresponding to the high activity on  prior bone scan. Stable 0.7 cm sclerotic lesion in the left iliac bone on image 86 series 6, technically nonspecific. Lower thoracic spondylosis is partially included on today's exam. IMPRESSION: 1. Significant increase in size of the large conglomerate left iliac nodal mass, currently 8.9 by 7.3 cm, formerly 6.4 by 4.0 cm. This surrounds the stented left ureter. 2. There is little in the way of excreted contrast on the delayed images indicating poor function of the atrophic left kidney (similar to prior). 3. Urinary bladder wall thickening is present and may be partially from nondistention but cystitis is a possibility. 4. Equivocal rectal wall thickening, some of this could be from prior radiation therapy, correlate with patient history. Mild increase in perirectal and presacral edema/stranding. 5. Stable 0.7 cm sclerotic lesion in the left iliac bone on image 86 series 6, technically nonspecific. 6. Right femoral IM nail with 2 screws extending into the right femoral head, the nail traverses a subtrochanteric fracture with callus formation along its margins likely corresponding to the high activity on prior bone scan. 7. Aortic atherosclerosis. Aortic Atherosclerosis (ICD10-I70.0). Electronically Signed   By: Van Clines M.D.   On: 11/23/2020 19:22     ASSESSMENT:  1. 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 -Germline mutation testing negative. Guardant 360 was negative for any actionable mutations. -11 cycles of docetaxel from 09/16/2019 through 04/12/2020. -CTAP on 12/01/2019 showed mild enlargement of the left iliac adenopathy measuring 6.4 x 4 cm, previously 5.6 x 3.77. This was compared to CT scan from September 2020. -Chemotherapy was held as he sustained right proximal femur fracture and transition to [place].  His sister is his healthcare  power of attorney. -CT [facility] with contrast on 11/23/2020 shows significant increase in size of the left iliac nodal mass measuring 8.9 x 7.3 cm (6.4 x 4 cm).  This surrounds the stented left ureter.  Urinary bladder wall thickening present.  Equivocal rectal wall thickening.  Stable 0.7 cm sclerotic lesion in the left iliac bone. -PSA on 10/31/2020 of 61.19.   PLAN:  1. 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 -I have also reviewed results of the CT scan of the abdomen and pelvis which showed worsening lymphadenopathy. -I have reviewed his records.  There is no indication that he was receiving Lupron lately.  He was initially receiving it at Dr. Noland Fordyce office. -I have recommended restarting Lupron at this time. -I have recommended meeting with his sister who is his healthcare proxy.  I do not believe he is a candidate for chemotherapy at this time. -I plan to recheck his PSA at next visit to see if there is any improvement with Lupron.  2. Bone metastasis: -Last denosumab on 03/21/2020.  Again this was stopped secondary to his fracture.  3. CKD: -Latest creatinine is around 1.69 and in the baseline.  4.  Urinary infection: -He complained of burning on urination.  UA in our office showed infection. -We will call in Cipro twice daily for 7 days.   Orders placed this encounter:  No orders of the defined types were placed in this encounter.    [name] Jack, MD [place] [phone]   I, Milinda Antis, am acting as a scribe for Dr. Sanda Linger.  I, [name] Jack MD, have reviewed the above documentation for accuracy and completeness, and I agree with the above.

## 2020-11-28 ENCOUNTER — Inpatient Hospital Stay (HOSPITAL_COMMUNITY): Payer: Medicare Other

## 2020-11-28 ENCOUNTER — Encounter (HOSPITAL_COMMUNITY): Payer: Self-pay

## 2020-11-28 VITALS — BP 162/84 | HR 97 | Temp 97.4°F | Resp 19

## 2020-11-28 DIAGNOSIS — C7951 Secondary malignant neoplasm of bone: Secondary | ICD-10-CM | POA: Diagnosis not present

## 2020-11-28 DIAGNOSIS — Z79899 Other long term (current) drug therapy: Secondary | ICD-10-CM | POA: Diagnosis not present

## 2020-11-28 DIAGNOSIS — N189 Chronic kidney disease, unspecified: Secondary | ICD-10-CM | POA: Diagnosis not present

## 2020-11-28 DIAGNOSIS — M80051P Age-related osteoporosis with current pathological fracture, right femur, subsequent encounter for fracture with malunion: Secondary | ICD-10-CM | POA: Diagnosis not present

## 2020-11-28 DIAGNOSIS — Z87891 Personal history of nicotine dependence: Secondary | ICD-10-CM | POA: Diagnosis not present

## 2020-11-28 DIAGNOSIS — Z5111 Encounter for antineoplastic chemotherapy: Secondary | ICD-10-CM | POA: Diagnosis not present

## 2020-11-28 DIAGNOSIS — N390 Urinary tract infection, site not specified: Secondary | ICD-10-CM | POA: Diagnosis not present

## 2020-11-28 DIAGNOSIS — Z803 Family history of malignant neoplasm of breast: Secondary | ICD-10-CM | POA: Diagnosis not present

## 2020-11-28 DIAGNOSIS — M6281 Muscle weakness (generalized): Secondary | ICD-10-CM | POA: Diagnosis not present

## 2020-11-28 DIAGNOSIS — Z809 Family history of malignant neoplasm, unspecified: Secondary | ICD-10-CM | POA: Diagnosis not present

## 2020-11-28 DIAGNOSIS — C61 Malignant neoplasm of prostate: Secondary | ICD-10-CM

## 2020-11-28 DIAGNOSIS — C779 Secondary and unspecified malignant neoplasm of lymph node, unspecified: Secondary | ICD-10-CM | POA: Diagnosis not present

## 2020-11-28 MED ORDER — LEUPROLIDE ACETATE (6 MONTH) 45 MG ~~LOC~~ KIT
45.0000 mg | PACK | Freq: Once | SUBCUTANEOUS | Status: AC
  Administered 2020-11-28: 45 mg via SUBCUTANEOUS
  Filled 2020-11-28: qty 45

## 2020-11-28 NOTE — Progress Notes (Signed)
[name] tolerated Eligard injection well without complaints or incident. VSS Pt discharged via wheelchair in satisfactory condition accompanied by caregiver

## 2020-11-28 NOTE — Patient Instructions (Signed)
[facility] Discharge Instructions  Received Eligard injection today. Follow-up as scheduled   Thank you for choosing [facility] at [facility] to provide your oncology and hematology care.  To afford each patient quality time with our provider, please arrive at least 15 minutes before your scheduled appointment time.   If you have a lab appointment with the Cancer Center please come in thru the Main Entrance and check in at the main information desk.  You need to re-schedule your appointment should you arrive 10 or more minutes late.  We strive to give you quality time with our providers, and arriving late affects you and other patients whose appointments are after yours.  Also, if you no show three or more times for appointments you may be dismissed from the clinic at the providers discretion.     Again, thank you for choosing [facility].  Our hope is that these requests will decrease the amount of time that you wait before being seen by our physicians.       _____________________________________________________________  Should you have questions after your visit to [facility], please contact our office at [phone] and follow the prompts.  Our office hours are 8:00 a.m. and 4:30 p.m. Monday - Friday.  Please note that voicemails left after 4:00 p.m. may not be returned until the following business day.  We are closed weekends and major holidays.  You do have access to a nurse 24-7, just call the main number to the clinic [phone] and do not press any options, hold on the line and a nurse will answer the phone.    For prescription refill requests, have your pharmacy contact our office and allow 72 hours.    Due to Covid, you will need to wear a mask upon entering the hospital. If you do not have a mask, a mask will be given to you at the Main Entrance upon arrival. For doctor visits, patients may have  1 support person age 18 or older with them. For treatment visits, patients can not have anyone with them due to social distancing guidelines and our immunocompromised population.     

## 2020-11-29 DIAGNOSIS — M80051P Age-related osteoporosis with current pathological fracture, right femur, subsequent encounter for fracture with malunion: Secondary | ICD-10-CM | POA: Diagnosis not present

## 2020-11-29 DIAGNOSIS — M6281 Muscle weakness (generalized): Secondary | ICD-10-CM | POA: Diagnosis not present

## 2020-11-29 DIAGNOSIS — N390 Urinary tract infection, site not specified: Secondary | ICD-10-CM | POA: Diagnosis not present

## 2020-11-30 DIAGNOSIS — M6281 Muscle weakness (generalized): Secondary | ICD-10-CM | POA: Diagnosis not present

## 2020-11-30 DIAGNOSIS — M80051P Age-related osteoporosis with current pathological fracture, right femur, subsequent encounter for fracture with malunion: Secondary | ICD-10-CM | POA: Diagnosis not present

## 2020-12-03 DIAGNOSIS — M80051P Age-related osteoporosis with current pathological fracture, right femur, subsequent encounter for fracture with malunion: Secondary | ICD-10-CM | POA: Diagnosis not present

## 2020-12-03 DIAGNOSIS — M6281 Muscle weakness (generalized): Secondary | ICD-10-CM | POA: Diagnosis not present

## 2020-12-04 DIAGNOSIS — M6281 Muscle weakness (generalized): Secondary | ICD-10-CM | POA: Diagnosis not present

## 2020-12-04 DIAGNOSIS — M80051P Age-related osteoporosis with current pathological fracture, right femur, subsequent encounter for fracture with malunion: Secondary | ICD-10-CM | POA: Diagnosis not present

## 2020-12-05 DIAGNOSIS — M80051P Age-related osteoporosis with current pathological fracture, right femur, subsequent encounter for fracture with malunion: Secondary | ICD-10-CM | POA: Diagnosis not present

## 2020-12-05 DIAGNOSIS — M6281 Muscle weakness (generalized): Secondary | ICD-10-CM | POA: Diagnosis not present

## 2020-12-06 DIAGNOSIS — M6281 Muscle weakness (generalized): Secondary | ICD-10-CM | POA: Diagnosis not present

## 2020-12-06 DIAGNOSIS — M80051P Age-related osteoporosis with current pathological fracture, right femur, subsequent encounter for fracture with malunion: Secondary | ICD-10-CM | POA: Diagnosis not present

## 2020-12-07 DIAGNOSIS — M80051P Age-related osteoporosis with current pathological fracture, right femur, subsequent encounter for fracture with malunion: Secondary | ICD-10-CM | POA: Diagnosis not present

## 2020-12-07 DIAGNOSIS — M6281 Muscle weakness (generalized): Secondary | ICD-10-CM | POA: Diagnosis not present

## 2020-12-19 DIAGNOSIS — H04129 Dry eye syndrome of unspecified lacrimal gland: Secondary | ICD-10-CM | POA: Diagnosis not present

## 2020-12-24 DIAGNOSIS — E7849 Other hyperlipidemia: Secondary | ICD-10-CM | POA: Diagnosis not present

## 2020-12-24 DIAGNOSIS — M79606 Pain in leg, unspecified: Secondary | ICD-10-CM | POA: Diagnosis not present

## 2020-12-24 DIAGNOSIS — I129 Hypertensive chronic kidney disease with stage 1 through stage 4 chronic kidney disease, or unspecified chronic kidney disease: Secondary | ICD-10-CM | POA: Diagnosis not present

## 2020-12-31 ENCOUNTER — Inpatient Hospital Stay (HOSPITAL_COMMUNITY): Payer: Medicare Other

## 2020-12-31 ENCOUNTER — Inpatient Hospital Stay (HOSPITAL_COMMUNITY): Payer: Medicare Other | Attending: Hematology | Admitting: Hematology

## 2021-01-07 DIAGNOSIS — I739 Peripheral vascular disease, unspecified: Secondary | ICD-10-CM | POA: Diagnosis not present

## 2021-01-07 DIAGNOSIS — M79675 Pain in left toe(s): Secondary | ICD-10-CM | POA: Diagnosis not present

## 2021-01-07 DIAGNOSIS — R262 Difficulty in walking, not elsewhere classified: Secondary | ICD-10-CM | POA: Diagnosis not present

## 2021-01-07 DIAGNOSIS — M79674 Pain in right toe(s): Secondary | ICD-10-CM | POA: Diagnosis not present

## 2021-01-07 DIAGNOSIS — M2141 Flat foot [pes planus] (acquired), right foot: Secondary | ICD-10-CM | POA: Diagnosis not present

## 2021-01-07 DIAGNOSIS — L603 Nail dystrophy: Secondary | ICD-10-CM | POA: Diagnosis not present

## 2021-01-07 DIAGNOSIS — M2142 Flat foot [pes planus] (acquired), left foot: Secondary | ICD-10-CM | POA: Diagnosis not present

## 2021-01-07 DIAGNOSIS — B351 Tinea unguium: Secondary | ICD-10-CM | POA: Diagnosis not present

## 2021-01-08 DIAGNOSIS — G8929 Other chronic pain: Secondary | ICD-10-CM | POA: Diagnosis not present

## 2021-01-08 DIAGNOSIS — I10 Essential (primary) hypertension: Secondary | ICD-10-CM | POA: Diagnosis not present

## 2021-01-08 DIAGNOSIS — R52 Pain, unspecified: Secondary | ICD-10-CM | POA: Diagnosis not present

## 2021-01-08 DIAGNOSIS — D649 Anemia, unspecified: Secondary | ICD-10-CM | POA: Diagnosis not present

## 2021-01-16 DIAGNOSIS — I10 Essential (primary) hypertension: Secondary | ICD-10-CM | POA: Diagnosis not present

## 2021-01-16 DIAGNOSIS — N1831 Chronic kidney disease, stage 3a: Secondary | ICD-10-CM | POA: Diagnosis not present

## 2021-01-16 DIAGNOSIS — C7951 Secondary malignant neoplasm of bone: Secondary | ICD-10-CM | POA: Diagnosis not present

## 2021-01-17 DIAGNOSIS — I10 Essential (primary) hypertension: Secondary | ICD-10-CM | POA: Diagnosis not present

## 2021-01-17 DIAGNOSIS — G8929 Other chronic pain: Secondary | ICD-10-CM | POA: Diagnosis not present

## 2021-01-17 DIAGNOSIS — N3281 Overactive bladder: Secondary | ICD-10-CM | POA: Diagnosis not present

## 2021-01-17 DIAGNOSIS — M6281 Muscle weakness (generalized): Secondary | ICD-10-CM | POA: Diagnosis not present

## 2021-01-17 DIAGNOSIS — N189 Chronic kidney disease, unspecified: Secondary | ICD-10-CM | POA: Diagnosis not present

## 2021-01-17 DIAGNOSIS — M80051D Age-related osteoporosis with current pathological fracture, right femur, subsequent encounter for fracture with routine healing: Secondary | ICD-10-CM | POA: Diagnosis not present

## 2021-01-17 DIAGNOSIS — R2689 Other abnormalities of gait and mobility: Secondary | ICD-10-CM | POA: Diagnosis not present

## 2021-01-17 DIAGNOSIS — D649 Anemia, unspecified: Secondary | ICD-10-CM | POA: Diagnosis not present

## 2021-01-18 DIAGNOSIS — R2689 Other abnormalities of gait and mobility: Secondary | ICD-10-CM | POA: Diagnosis not present

## 2021-01-18 DIAGNOSIS — M6281 Muscle weakness (generalized): Secondary | ICD-10-CM | POA: Diagnosis not present

## 2021-01-18 DIAGNOSIS — M80051D Age-related osteoporosis with current pathological fracture, right femur, subsequent encounter for fracture with routine healing: Secondary | ICD-10-CM | POA: Diagnosis not present

## 2021-01-19 DIAGNOSIS — R2689 Other abnormalities of gait and mobility: Secondary | ICD-10-CM | POA: Diagnosis not present

## 2021-01-19 DIAGNOSIS — M80051D Age-related osteoporosis with current pathological fracture, right femur, subsequent encounter for fracture with routine healing: Secondary | ICD-10-CM | POA: Diagnosis not present

## 2021-01-19 DIAGNOSIS — M6281 Muscle weakness (generalized): Secondary | ICD-10-CM | POA: Diagnosis not present

## 2021-01-21 DIAGNOSIS — R2689 Other abnormalities of gait and mobility: Secondary | ICD-10-CM | POA: Diagnosis not present

## 2021-01-21 DIAGNOSIS — M80051D Age-related osteoporosis with current pathological fracture, right femur, subsequent encounter for fracture with routine healing: Secondary | ICD-10-CM | POA: Diagnosis not present

## 2021-01-21 DIAGNOSIS — M6281 Muscle weakness (generalized): Secondary | ICD-10-CM | POA: Diagnosis not present

## 2021-01-22 DIAGNOSIS — R2689 Other abnormalities of gait and mobility: Secondary | ICD-10-CM | POA: Diagnosis not present

## 2021-01-22 DIAGNOSIS — M80051D Age-related osteoporosis with current pathological fracture, right femur, subsequent encounter for fracture with routine healing: Secondary | ICD-10-CM | POA: Diagnosis not present

## 2021-01-22 DIAGNOSIS — M6281 Muscle weakness (generalized): Secondary | ICD-10-CM | POA: Diagnosis not present

## 2021-01-23 DIAGNOSIS — M6281 Muscle weakness (generalized): Secondary | ICD-10-CM | POA: Diagnosis not present

## 2021-01-23 DIAGNOSIS — R2689 Other abnormalities of gait and mobility: Secondary | ICD-10-CM | POA: Diagnosis not present

## 2021-01-23 DIAGNOSIS — M80051D Age-related osteoporosis with current pathological fracture, right femur, subsequent encounter for fracture with routine healing: Secondary | ICD-10-CM | POA: Diagnosis not present

## 2021-01-24 DIAGNOSIS — M80051D Age-related osteoporosis with current pathological fracture, right femur, subsequent encounter for fracture with routine healing: Secondary | ICD-10-CM | POA: Diagnosis not present

## 2021-01-24 DIAGNOSIS — R2689 Other abnormalities of gait and mobility: Secondary | ICD-10-CM | POA: Diagnosis not present

## 2021-01-24 DIAGNOSIS — M6281 Muscle weakness (generalized): Secondary | ICD-10-CM | POA: Diagnosis not present

## 2021-01-25 DIAGNOSIS — M80051D Age-related osteoporosis with current pathological fracture, right femur, subsequent encounter for fracture with routine healing: Secondary | ICD-10-CM | POA: Diagnosis not present

## 2021-01-25 DIAGNOSIS — M6281 Muscle weakness (generalized): Secondary | ICD-10-CM | POA: Diagnosis not present

## 2021-01-25 DIAGNOSIS — R2689 Other abnormalities of gait and mobility: Secondary | ICD-10-CM | POA: Diagnosis not present

## 2021-01-28 DIAGNOSIS — M6281 Muscle weakness (generalized): Secondary | ICD-10-CM | POA: Diagnosis not present

## 2021-01-28 DIAGNOSIS — M80051D Age-related osteoporosis with current pathological fracture, right femur, subsequent encounter for fracture with routine healing: Secondary | ICD-10-CM | POA: Diagnosis not present

## 2021-01-28 DIAGNOSIS — R2689 Other abnormalities of gait and mobility: Secondary | ICD-10-CM | POA: Diagnosis not present

## 2021-01-29 DIAGNOSIS — L603 Nail dystrophy: Secondary | ICD-10-CM | POA: Diagnosis not present

## 2021-01-29 DIAGNOSIS — M2041 Other hammer toe(s) (acquired), right foot: Secondary | ICD-10-CM | POA: Diagnosis not present

## 2021-01-29 DIAGNOSIS — B351 Tinea unguium: Secondary | ICD-10-CM | POA: Diagnosis not present

## 2021-01-29 DIAGNOSIS — M6281 Muscle weakness (generalized): Secondary | ICD-10-CM | POA: Diagnosis not present

## 2021-01-29 DIAGNOSIS — M80051D Age-related osteoporosis with current pathological fracture, right femur, subsequent encounter for fracture with routine healing: Secondary | ICD-10-CM | POA: Diagnosis not present

## 2021-01-29 DIAGNOSIS — M2042 Other hammer toe(s) (acquired), left foot: Secondary | ICD-10-CM | POA: Diagnosis not present

## 2021-01-29 DIAGNOSIS — I7091 Generalized atherosclerosis: Secondary | ICD-10-CM | POA: Diagnosis not present

## 2021-01-29 DIAGNOSIS — R2689 Other abnormalities of gait and mobility: Secondary | ICD-10-CM | POA: Diagnosis not present

## 2021-01-30 DIAGNOSIS — M80051D Age-related osteoporosis with current pathological fracture, right femur, subsequent encounter for fracture with routine healing: Secondary | ICD-10-CM | POA: Diagnosis not present

## 2021-01-30 DIAGNOSIS — R2689 Other abnormalities of gait and mobility: Secondary | ICD-10-CM | POA: Diagnosis not present

## 2021-01-30 DIAGNOSIS — M6281 Muscle weakness (generalized): Secondary | ICD-10-CM | POA: Diagnosis not present

## 2021-01-31 DIAGNOSIS — M80051D Age-related osteoporosis with current pathological fracture, right femur, subsequent encounter for fracture with routine healing: Secondary | ICD-10-CM | POA: Diagnosis not present

## 2021-01-31 DIAGNOSIS — M6281 Muscle weakness (generalized): Secondary | ICD-10-CM | POA: Diagnosis not present

## 2021-01-31 DIAGNOSIS — R2689 Other abnormalities of gait and mobility: Secondary | ICD-10-CM | POA: Diagnosis not present

## 2021-02-01 DIAGNOSIS — H04123 Dry eye syndrome of bilateral lacrimal glands: Secondary | ICD-10-CM | POA: Diagnosis not present

## 2021-02-01 DIAGNOSIS — H18413 Arcus senilis, bilateral: Secondary | ICD-10-CM | POA: Diagnosis not present

## 2021-02-01 DIAGNOSIS — H524 Presbyopia: Secondary | ICD-10-CM | POA: Diagnosis not present

## 2021-02-01 DIAGNOSIS — R2689 Other abnormalities of gait and mobility: Secondary | ICD-10-CM | POA: Diagnosis not present

## 2021-02-01 DIAGNOSIS — M80051D Age-related osteoporosis with current pathological fracture, right femur, subsequent encounter for fracture with routine healing: Secondary | ICD-10-CM | POA: Diagnosis not present

## 2021-02-01 DIAGNOSIS — Z961 Presence of intraocular lens: Secondary | ICD-10-CM | POA: Diagnosis not present

## 2021-02-01 DIAGNOSIS — M6281 Muscle weakness (generalized): Secondary | ICD-10-CM | POA: Diagnosis not present

## 2021-02-04 DIAGNOSIS — R2689 Other abnormalities of gait and mobility: Secondary | ICD-10-CM | POA: Diagnosis not present

## 2021-02-04 DIAGNOSIS — M6281 Muscle weakness (generalized): Secondary | ICD-10-CM | POA: Diagnosis not present

## 2021-02-04 DIAGNOSIS — M80051D Age-related osteoporosis with current pathological fracture, right femur, subsequent encounter for fracture with routine healing: Secondary | ICD-10-CM | POA: Diagnosis not present

## 2021-02-05 DIAGNOSIS — R2689 Other abnormalities of gait and mobility: Secondary | ICD-10-CM | POA: Diagnosis not present

## 2021-02-05 DIAGNOSIS — M80051D Age-related osteoporosis with current pathological fracture, right femur, subsequent encounter for fracture with routine healing: Secondary | ICD-10-CM | POA: Diagnosis not present

## 2021-02-05 DIAGNOSIS — M6281 Muscle weakness (generalized): Secondary | ICD-10-CM | POA: Diagnosis not present

## 2021-02-18 DIAGNOSIS — D649 Anemia, unspecified: Secondary | ICD-10-CM | POA: Diagnosis not present

## 2021-02-18 DIAGNOSIS — N3281 Overactive bladder: Secondary | ICD-10-CM | POA: Diagnosis not present

## 2021-02-18 DIAGNOSIS — I10 Essential (primary) hypertension: Secondary | ICD-10-CM | POA: Diagnosis not present

## 2021-02-18 DIAGNOSIS — G8929 Other chronic pain: Secondary | ICD-10-CM | POA: Diagnosis not present

## 2021-03-19 DIAGNOSIS — G8929 Other chronic pain: Secondary | ICD-10-CM | POA: Diagnosis not present

## 2021-03-19 DIAGNOSIS — N189 Chronic kidney disease, unspecified: Secondary | ICD-10-CM | POA: Diagnosis not present

## 2021-03-19 DIAGNOSIS — I10 Essential (primary) hypertension: Secondary | ICD-10-CM | POA: Diagnosis not present

## 2021-03-19 DIAGNOSIS — N3281 Overactive bladder: Secondary | ICD-10-CM | POA: Diagnosis not present

## 2021-03-19 DIAGNOSIS — D649 Anemia, unspecified: Secondary | ICD-10-CM | POA: Diagnosis not present

## 2021-04-04 DIAGNOSIS — B351 Tinea unguium: Secondary | ICD-10-CM | POA: Diagnosis not present

## 2021-04-04 DIAGNOSIS — L603 Nail dystrophy: Secondary | ICD-10-CM | POA: Diagnosis not present

## 2021-04-04 DIAGNOSIS — I7091 Generalized atherosclerosis: Secondary | ICD-10-CM | POA: Diagnosis not present

## 2021-04-15 DIAGNOSIS — I10 Essential (primary) hypertension: Secondary | ICD-10-CM | POA: Diagnosis not present

## 2021-04-15 DIAGNOSIS — F32A Depression, unspecified: Secondary | ICD-10-CM | POA: Diagnosis not present

## 2021-04-15 DIAGNOSIS — N189 Chronic kidney disease, unspecified: Secondary | ICD-10-CM | POA: Diagnosis not present

## 2021-04-15 DIAGNOSIS — N3281 Overactive bladder: Secondary | ICD-10-CM | POA: Diagnosis not present

## 2021-04-15 DIAGNOSIS — D649 Anemia, unspecified: Secondary | ICD-10-CM | POA: Diagnosis not present

## 2021-04-15 DIAGNOSIS — G8929 Other chronic pain: Secondary | ICD-10-CM | POA: Diagnosis not present

## 2021-04-20 DIAGNOSIS — N390 Urinary tract infection, site not specified: Secondary | ICD-10-CM | POA: Diagnosis not present

## 2021-04-22 DIAGNOSIS — N189 Chronic kidney disease, unspecified: Secondary | ICD-10-CM | POA: Diagnosis not present

## 2021-04-23 DIAGNOSIS — Z23 Encounter for immunization: Secondary | ICD-10-CM | POA: Diagnosis not present

## 2021-04-26 DIAGNOSIS — I10 Essential (primary) hypertension: Secondary | ICD-10-CM | POA: Diagnosis not present

## 2021-04-26 DIAGNOSIS — D649 Anemia, unspecified: Secondary | ICD-10-CM | POA: Diagnosis not present

## 2021-05-03 DIAGNOSIS — I10 Essential (primary) hypertension: Secondary | ICD-10-CM | POA: Diagnosis not present

## 2021-05-03 DIAGNOSIS — G8929 Other chronic pain: Secondary | ICD-10-CM | POA: Diagnosis not present

## 2021-05-06 DIAGNOSIS — M6281 Muscle weakness (generalized): Secondary | ICD-10-CM | POA: Diagnosis not present

## 2021-05-06 DIAGNOSIS — R262 Difficulty in walking, not elsewhere classified: Secondary | ICD-10-CM | POA: Diagnosis not present

## 2021-05-07 DIAGNOSIS — M6281 Muscle weakness (generalized): Secondary | ICD-10-CM | POA: Diagnosis not present

## 2021-05-07 DIAGNOSIS — R262 Difficulty in walking, not elsewhere classified: Secondary | ICD-10-CM | POA: Diagnosis not present

## 2021-05-07 DIAGNOSIS — M545 Low back pain, unspecified: Secondary | ICD-10-CM | POA: Diagnosis not present

## 2021-05-08 DIAGNOSIS — R262 Difficulty in walking, not elsewhere classified: Secondary | ICD-10-CM | POA: Diagnosis not present

## 2021-05-08 DIAGNOSIS — M6281 Muscle weakness (generalized): Secondary | ICD-10-CM | POA: Diagnosis not present

## 2021-05-09 DIAGNOSIS — M6281 Muscle weakness (generalized): Secondary | ICD-10-CM | POA: Diagnosis not present

## 2021-05-09 DIAGNOSIS — R262 Difficulty in walking, not elsewhere classified: Secondary | ICD-10-CM | POA: Diagnosis not present

## 2021-05-10 DIAGNOSIS — M6281 Muscle weakness (generalized): Secondary | ICD-10-CM | POA: Diagnosis not present

## 2021-05-10 DIAGNOSIS — R262 Difficulty in walking, not elsewhere classified: Secondary | ICD-10-CM | POA: Diagnosis not present

## 2021-05-13 DIAGNOSIS — R262 Difficulty in walking, not elsewhere classified: Secondary | ICD-10-CM | POA: Diagnosis not present

## 2021-05-13 DIAGNOSIS — M6281 Muscle weakness (generalized): Secondary | ICD-10-CM | POA: Diagnosis not present

## 2021-05-14 DIAGNOSIS — N189 Chronic kidney disease, unspecified: Secondary | ICD-10-CM | POA: Diagnosis not present

## 2021-05-14 DIAGNOSIS — M6281 Muscle weakness (generalized): Secondary | ICD-10-CM | POA: Diagnosis not present

## 2021-05-14 DIAGNOSIS — I10 Essential (primary) hypertension: Secondary | ICD-10-CM | POA: Diagnosis not present

## 2021-05-14 DIAGNOSIS — R262 Difficulty in walking, not elsewhere classified: Secondary | ICD-10-CM | POA: Diagnosis not present

## 2021-05-14 DIAGNOSIS — D649 Anemia, unspecified: Secondary | ICD-10-CM | POA: Diagnosis not present

## 2021-05-15 DIAGNOSIS — R262 Difficulty in walking, not elsewhere classified: Secondary | ICD-10-CM | POA: Diagnosis not present

## 2021-05-15 DIAGNOSIS — M6281 Muscle weakness (generalized): Secondary | ICD-10-CM | POA: Diagnosis not present

## 2021-05-17 DIAGNOSIS — M6281 Muscle weakness (generalized): Secondary | ICD-10-CM | POA: Diagnosis not present

## 2021-05-17 DIAGNOSIS — R262 Difficulty in walking, not elsewhere classified: Secondary | ICD-10-CM | POA: Diagnosis not present

## 2021-05-20 DIAGNOSIS — R262 Difficulty in walking, not elsewhere classified: Secondary | ICD-10-CM | POA: Diagnosis not present

## 2021-05-20 DIAGNOSIS — M6281 Muscle weakness (generalized): Secondary | ICD-10-CM | POA: Diagnosis not present

## 2021-05-21 DIAGNOSIS — R262 Difficulty in walking, not elsewhere classified: Secondary | ICD-10-CM | POA: Diagnosis not present

## 2021-05-21 DIAGNOSIS — M6281 Muscle weakness (generalized): Secondary | ICD-10-CM | POA: Diagnosis not present

## 2021-05-22 DIAGNOSIS — M6281 Muscle weakness (generalized): Secondary | ICD-10-CM | POA: Diagnosis not present

## 2021-05-22 DIAGNOSIS — R262 Difficulty in walking, not elsewhere classified: Secondary | ICD-10-CM | POA: Diagnosis not present

## 2021-05-24 DIAGNOSIS — M6281 Muscle weakness (generalized): Secondary | ICD-10-CM | POA: Diagnosis not present

## 2021-05-24 DIAGNOSIS — R262 Difficulty in walking, not elsewhere classified: Secondary | ICD-10-CM | POA: Diagnosis not present

## 2021-05-27 DIAGNOSIS — M6281 Muscle weakness (generalized): Secondary | ICD-10-CM | POA: Diagnosis not present

## 2021-05-28 ENCOUNTER — Ambulatory Visit (HOSPITAL_COMMUNITY): Payer: Medicare Other

## 2021-05-28 DIAGNOSIS — M6281 Muscle weakness (generalized): Secondary | ICD-10-CM | POA: Diagnosis not present

## 2021-05-29 DIAGNOSIS — M6281 Muscle weakness (generalized): Secondary | ICD-10-CM | POA: Diagnosis not present

## 2021-05-30 DIAGNOSIS — M6281 Muscle weakness (generalized): Secondary | ICD-10-CM | POA: Diagnosis not present

## 2021-05-31 DIAGNOSIS — M6281 Muscle weakness (generalized): Secondary | ICD-10-CM | POA: Diagnosis not present

## 2021-06-07 DIAGNOSIS — G8929 Other chronic pain: Secondary | ICD-10-CM | POA: Diagnosis not present

## 2021-06-11 DIAGNOSIS — C7951 Secondary malignant neoplasm of bone: Secondary | ICD-10-CM | POA: Diagnosis not present

## 2021-06-18 DIAGNOSIS — I10 Essential (primary) hypertension: Secondary | ICD-10-CM | POA: Diagnosis not present

## 2021-06-18 DIAGNOSIS — N3281 Overactive bladder: Secondary | ICD-10-CM | POA: Diagnosis not present

## 2021-06-18 DIAGNOSIS — C7951 Secondary malignant neoplasm of bone: Secondary | ICD-10-CM | POA: Diagnosis not present

## 2021-06-18 DIAGNOSIS — N189 Chronic kidney disease, unspecified: Secondary | ICD-10-CM | POA: Diagnosis not present

## 2021-06-18 DIAGNOSIS — D649 Anemia, unspecified: Secondary | ICD-10-CM | POA: Diagnosis not present

## 2021-06-18 DIAGNOSIS — F32A Depression, unspecified: Secondary | ICD-10-CM | POA: Diagnosis not present

## 2021-06-19 DIAGNOSIS — Z87828 Personal history of other (healed) physical injury and trauma: Secondary | ICD-10-CM | POA: Diagnosis not present

## 2021-06-19 DIAGNOSIS — G8929 Other chronic pain: Secondary | ICD-10-CM | POA: Diagnosis not present

## 2021-06-19 DIAGNOSIS — R6889 Other general symptoms and signs: Secondary | ICD-10-CM | POA: Diagnosis not present

## 2021-06-19 DIAGNOSIS — I499 Cardiac arrhythmia, unspecified: Secondary | ICD-10-CM | POA: Diagnosis not present

## 2021-06-19 DIAGNOSIS — Z79899 Other long term (current) drug therapy: Secondary | ICD-10-CM | POA: Diagnosis not present

## 2021-06-19 DIAGNOSIS — R404 Transient alteration of awareness: Secondary | ICD-10-CM | POA: Diagnosis not present

## 2021-06-19 DIAGNOSIS — Z9282 Status post administration of tPA (rtPA) in a different facility within the last 24 hours prior to admission to current facility: Secondary | ICD-10-CM | POA: Diagnosis not present

## 2021-06-19 DIAGNOSIS — N179 Acute kidney failure, unspecified: Secondary | ICD-10-CM | POA: Diagnosis not present

## 2021-06-19 DIAGNOSIS — H04129 Dry eye syndrome of unspecified lacrimal gland: Secondary | ICD-10-CM | POA: Diagnosis not present

## 2021-06-19 DIAGNOSIS — H18413 Arcus senilis, bilateral: Secondary | ICD-10-CM | POA: Diagnosis not present

## 2021-06-19 DIAGNOSIS — I10 Essential (primary) hypertension: Secondary | ICD-10-CM | POA: Diagnosis not present

## 2021-06-19 DIAGNOSIS — D631 Anemia in chronic kidney disease: Secondary | ICD-10-CM | POA: Diagnosis not present

## 2021-06-19 DIAGNOSIS — I959 Hypotension, unspecified: Secondary | ICD-10-CM | POA: Diagnosis not present

## 2021-06-19 DIAGNOSIS — N183 Chronic kidney disease, stage 3 unspecified: Secondary | ICD-10-CM | POA: Diagnosis not present

## 2021-06-19 DIAGNOSIS — R1310 Dysphagia, unspecified: Secondary | ICD-10-CM | POA: Diagnosis not present

## 2021-06-19 DIAGNOSIS — G4730 Sleep apnea, unspecified: Secondary | ICD-10-CM | POA: Diagnosis not present

## 2021-06-19 DIAGNOSIS — R58 Hemorrhage, not elsewhere classified: Secondary | ICD-10-CM | POA: Diagnosis not present

## 2021-06-19 DIAGNOSIS — M6281 Muscle weakness (generalized): Secondary | ICD-10-CM | POA: Diagnosis not present

## 2021-06-19 DIAGNOSIS — R279 Unspecified lack of coordination: Secondary | ICD-10-CM | POA: Diagnosis not present

## 2021-06-19 DIAGNOSIS — I129 Hypertensive chronic kidney disease with stage 1 through stage 4 chronic kidney disease, or unspecified chronic kidney disease: Secondary | ICD-10-CM | POA: Diagnosis not present

## 2021-06-19 DIAGNOSIS — J9811 Atelectasis: Secondary | ICD-10-CM | POA: Diagnosis not present

## 2021-06-19 DIAGNOSIS — Z95828 Presence of other vascular implants and grafts: Secondary | ICD-10-CM | POA: Diagnosis not present

## 2021-06-19 DIAGNOSIS — N3281 Overactive bladder: Secondary | ICD-10-CM | POA: Diagnosis not present

## 2021-06-19 DIAGNOSIS — R079 Chest pain, unspecified: Secondary | ICD-10-CM | POA: Diagnosis not present

## 2021-06-19 DIAGNOSIS — M810 Age-related osteoporosis without current pathological fracture: Secondary | ICD-10-CM | POA: Diagnosis not present

## 2021-06-19 DIAGNOSIS — A419 Sepsis, unspecified organism: Secondary | ICD-10-CM | POA: Diagnosis not present

## 2021-06-19 DIAGNOSIS — H9190 Unspecified hearing loss, unspecified ear: Secondary | ICD-10-CM | POA: Diagnosis not present

## 2021-06-19 DIAGNOSIS — D649 Anemia, unspecified: Secondary | ICD-10-CM | POA: Diagnosis not present

## 2021-06-19 DIAGNOSIS — M47816 Spondylosis without myelopathy or radiculopathy, lumbar region: Secondary | ICD-10-CM | POA: Diagnosis not present

## 2021-06-19 DIAGNOSIS — Z743 Need for continuous supervision: Secondary | ICD-10-CM | POA: Diagnosis not present

## 2021-06-19 DIAGNOSIS — C7951 Secondary malignant neoplasm of bone: Secondary | ICD-10-CM | POA: Diagnosis not present

## 2021-06-19 DIAGNOSIS — Z20822 Contact with and (suspected) exposure to covid-19: Secondary | ICD-10-CM | POA: Diagnosis not present

## 2021-06-19 DIAGNOSIS — N189 Chronic kidney disease, unspecified: Secondary | ICD-10-CM | POA: Diagnosis not present

## 2021-06-19 DIAGNOSIS — H02839 Dermatochalasis of unspecified eye, unspecified eyelid: Secondary | ICD-10-CM | POA: Diagnosis not present

## 2021-06-23 DIAGNOSIS — Z9282 Status post administration of tPA (rtPA) in a different facility within the last 24 hours prior to admission to current facility: Secondary | ICD-10-CM | POA: Diagnosis not present

## 2021-06-23 DIAGNOSIS — R279 Unspecified lack of coordination: Secondary | ICD-10-CM | POA: Diagnosis not present

## 2021-06-23 DIAGNOSIS — C61 Malignant neoplasm of prostate: Secondary | ICD-10-CM | POA: Diagnosis present

## 2021-06-23 DIAGNOSIS — M6281 Muscle weakness (generalized): Secondary | ICD-10-CM | POA: Diagnosis not present

## 2021-06-23 DIAGNOSIS — N183 Chronic kidney disease, stage 3 unspecified: Secondary | ICD-10-CM | POA: Diagnosis not present

## 2021-06-23 DIAGNOSIS — Z79899 Other long term (current) drug therapy: Secondary | ICD-10-CM | POA: Diagnosis not present

## 2021-06-23 DIAGNOSIS — M47816 Spondylosis without myelopathy or radiculopathy, lumbar region: Secondary | ICD-10-CM | POA: Diagnosis not present

## 2021-06-23 DIAGNOSIS — N3281 Overactive bladder: Secondary | ICD-10-CM | POA: Diagnosis not present

## 2021-06-23 DIAGNOSIS — Z5111 Encounter for antineoplastic chemotherapy: Secondary | ICD-10-CM | POA: Diagnosis not present

## 2021-06-23 DIAGNOSIS — E119 Type 2 diabetes mellitus without complications: Secondary | ICD-10-CM | POA: Diagnosis not present

## 2021-06-23 DIAGNOSIS — M810 Age-related osteoporosis without current pathological fracture: Secondary | ICD-10-CM | POA: Diagnosis not present

## 2021-06-23 DIAGNOSIS — R195 Other fecal abnormalities: Secondary | ICD-10-CM | POA: Diagnosis not present

## 2021-06-23 DIAGNOSIS — R0902 Hypoxemia: Secondary | ICD-10-CM | POA: Diagnosis not present

## 2021-06-23 DIAGNOSIS — H18413 Arcus senilis, bilateral: Secondary | ICD-10-CM | POA: Diagnosis not present

## 2021-06-23 DIAGNOSIS — C7951 Secondary malignant neoplasm of bone: Secondary | ICD-10-CM | POA: Diagnosis not present

## 2021-06-23 DIAGNOSIS — D649 Anemia, unspecified: Secondary | ICD-10-CM | POA: Diagnosis not present

## 2021-06-23 DIAGNOSIS — I10 Essential (primary) hypertension: Secondary | ICD-10-CM | POA: Diagnosis not present

## 2021-06-23 DIAGNOSIS — Z95828 Presence of other vascular implants and grafts: Secondary | ICD-10-CM | POA: Diagnosis not present

## 2021-06-23 DIAGNOSIS — H02839 Dermatochalasis of unspecified eye, unspecified eyelid: Secondary | ICD-10-CM | POA: Diagnosis not present

## 2021-06-23 DIAGNOSIS — H04129 Dry eye syndrome of unspecified lacrimal gland: Secondary | ICD-10-CM | POA: Diagnosis not present

## 2021-06-23 DIAGNOSIS — G8929 Other chronic pain: Secondary | ICD-10-CM | POA: Diagnosis not present

## 2021-06-23 DIAGNOSIS — N189 Chronic kidney disease, unspecified: Secondary | ICD-10-CM | POA: Diagnosis not present

## 2021-06-23 DIAGNOSIS — R1310 Dysphagia, unspecified: Secondary | ICD-10-CM | POA: Diagnosis not present

## 2021-06-25 DIAGNOSIS — C7951 Secondary malignant neoplasm of bone: Secondary | ICD-10-CM | POA: Diagnosis not present

## 2021-06-25 DIAGNOSIS — N189 Chronic kidney disease, unspecified: Secondary | ICD-10-CM | POA: Diagnosis not present

## 2021-06-25 DIAGNOSIS — N3281 Overactive bladder: Secondary | ICD-10-CM | POA: Diagnosis not present

## 2021-06-25 DIAGNOSIS — D649 Anemia, unspecified: Secondary | ICD-10-CM | POA: Diagnosis not present

## 2021-06-25 DIAGNOSIS — I10 Essential (primary) hypertension: Secondary | ICD-10-CM | POA: Diagnosis not present

## 2021-06-26 ENCOUNTER — Other Ambulatory Visit (HOSPITAL_COMMUNITY): Payer: Self-pay | Admitting: *Deleted

## 2021-06-26 DIAGNOSIS — C7951 Secondary malignant neoplasm of bone: Secondary | ICD-10-CM | POA: Diagnosis not present

## 2021-06-26 DIAGNOSIS — R0902 Hypoxemia: Secondary | ICD-10-CM | POA: Diagnosis not present

## 2021-06-26 DIAGNOSIS — R195 Other fecal abnormalities: Secondary | ICD-10-CM | POA: Diagnosis not present

## 2021-06-26 NOTE — Progress Notes (Signed)
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 Enzalutamide from 08/2018 to 06/2019. 2. Docetaxel x 11 cycles from 09/08/2019 to 04/12/2020.  NGS Results: Guardant 360 negative for mutations  CURRENT THERAPY: surveillance  BRIEF ONCOLOGIC HISTORY:  Oncology History  Prostate cancer metastatic to bone ([place])  04/04/2016 Initial Diagnosis   Prostate cancer metastatic to bone ([place])   07/14/2019 Genetic Testing   Negative genetic testing on the common hereditary cancer panel.  The Common Hereditary Gene Panel offered by [facility] includes sequencing and/or deletion duplication testing of the following 48 genes: APC, ATM, AXIN2, BARD1, BMPR1A, BRCA1, BRCA2, BRIP1, CDH1, CDK4, CDKN2A (p14ARF), CDKN2A (p16INK4a), CHEK2, CTNNA1, DICER1, EPCAM (Deletion/duplication testing only), GREM1 (promoter region deletion/duplication testing only), KIT, MEN1, MLH1, MSH2, MSH3, MSH6, MUTYH, NBN, NF1, NHTL1, PALB2, PDGFRA, PMS2, POLD1, POLE, PTEN, RAD50, RAD51C, RAD51D, RNF43, SDHB, SDHC, SDHD, SMAD4, SMARCA4. STK11, TP53, TSC1, TSC2, and VHL.  The following genes were evaluated for sequence changes only: SDHA and HOXB13 c.251G>A variant only. The report date is July 14, 2019.   09/08/2019 -  Chemotherapy   The patient had pegfilgrastim (NEULASTA) injection 6 mg, 6 mg, Subcutaneous, Once, 11 of 14 cycles Administration: 6 mg (09/09/2019), 6 mg (10/04/2019), 6 mg (10/31/2019), 6 mg (11/16/2019), 6 mg (12/07/2019), 6 mg (12/28/2019), 6 mg (01/17/2020), 6 mg (02/08/2020), 6 mg (02/29/2020), 6 mg (03/21/2020) ondansetron (ZOFRAN) 8 mg in sodium chloride 0.9 % 50 mL IVPB, 8 mg (100 % of original dose 8 mg), Intravenous,  Once, 1 of 1 cycle Dose modification: 8 mg (original dose 8 mg, Cycle  1) Administration: 8 mg (09/08/2019) DOCEtaxel (TAXOTERE) 120 mg in sodium chloride 0.9 % 250 mL chemo infusion, 50 mg/m2 = 120 mg (66.7 % of original dose 75 mg/m2), Intravenous,  Once, 11 of 14 cycles Dose modification: 50 mg/m2 (66.7 % of original dose 75 mg/m2, Cycle 1, Reason: Provider Judgment) Administration: 120 mg (09/08/2019), 120 mg (09/30/2019), 120 mg (10/24/2019), 120 mg (11/14/2019), 120 mg (12/05/2019), 120 mg (12/26/2019), 120 mg (01/16/2020), 120 mg (02/06/2020), 120 mg (02/27/2020), 120 mg (03/19/2020), 120 mg (04/12/2020) ondansetron (ZOFRAN) 8 mg, dexamethasone (DECADRON) 10 mg in sodium chloride 0.9 % 50 mL IVPB, , Intravenous,  Once, 10 of 13 cycles Administration:  (09/30/2019),  (10/24/2019),  (11/14/2019),  (12/05/2019),  (12/26/2019),  (01/16/2020),  (02/06/2020),  (02/27/2020),  (03/19/2020),  (04/12/2020)   for chemotherapy treatment.       CANCER STAGING: Cancer Staging No matching staging information was found for the patient.  INTERVAL HISTORY:  [name], a 80 y.o. male, returns for routine follow-up of his metastatic prostate cancer to bone. Anas was last seen on 11/27/2020.   Today he reports feeling okay. He presented to the ED on 06/19/2021 for hematochezia. He reports pain in his back and legs.   REVIEW OF SYSTEMS:  Review of Systems  Constitutional:  Positive for fatigue (depleted).  Respiratory:  Positive for shortness of breath.   Gastrointestinal:  Positive for blood in stool.  Genitourinary:  Positive for dysuria.   Musculoskeletal:  Positive for back pain and myalgias (legs).  All other systems reviewed and are negative.  PAST MEDICAL/SURGICAL HISTORY:  Past Medical History:  Diagnosis Date   Anemia    Cancer (  [place])    prostate   Chronic kidney disease, stage 3a ([place])    Dementia ([place])    Family history of breast cancer    Family history of prostate cancer    GERD (gastroesophageal reflux disease)    History of falling    HOH (hard of hearing)     Hyperlipidemia    Hypertension    Overactive bladder    Port-A-Cath in place 08/29/2019   Prostate cancer metastatic to bone ([place]) 04/04/2016   Sleep apnea 10/24/2016   Past Surgical History:  Procedure Laterality Date   CATARACT EXTRACTION W/PHACO Right 04/22/2019   Procedure: CATARACT EXTRACTION PHACO AND INTRAOCULAR LENS PLACEMENT RIGHT EYE;  Surgeon: Baruch Goldmann, MD;  Location: [facility];  Service: Ophthalmology;  Laterality: Right;  right   CATARACT EXTRACTION W/PHACO Left 05/13/2019   Procedure: CATARACT EXTRACTION PHACO AND INTRAOCULAR LENS PLACEMENT LEFT EYE;  Surgeon: Baruch Goldmann, MD;  Location: [facility];  Service: Ophthalmology;  Laterality: Left;  left   CHOLECYSTECTOMY     CYSTOSCOPY W/ URETERAL STENT PLACEMENT Left 08/17/2019   Procedure: CYSTOSCOPY WITH RETROGRADE PYELOGRAM/URETERAL STENT PLACEMENT;  Surgeon: Cleon Gustin, MD;  Location: [facility];  Service: Urology;  Laterality: Left;   HAND SURGERY     INGUINAL HERNIA REPAIR Left    PORTACATH PLACEMENT Left 09/07/2019   Procedure: INSERTION PORT-A-CATH;  Surgeon: Virl Cagey, MD;  Location: [facility];  Service: General;  Laterality: Left;   PROSTATECTOMY     PROSTATECTOMY     radical prostatectomy     TONSILLECTOMY      SOCIAL HISTORY:  Social History   Socioeconomic History   Marital status: Widowed    Spouse name: Not on file   Number of children: 3   Years of education: 9   Highest education level: Not on file  Occupational History   Occupation: Animator- retired    Comment: central [employer]  Tobacco Use   Smoking status: Former    Packs/day: 1.00    Years: 32.00    Pack years: 32.00    Types: Cigarettes    Quit date: 02/04/1989    Years since quitting: 32.4   Smokeless tobacco: Never  Vaping Use   Vaping Use: Never used  Substance and Sexual Activity   Alcohol use: No   Drug use: No   Sexual activity: Not Currently  Other Topics Concern   Not on file  Social History Narrative    Widowed   Loves with son   Chronic low back pain   Social Determinants of Health   Financial Resource Strain: Not on file  Food Insecurity: Not on file  Transportation Needs: Not on file  Physical Activity: Not on file  Stress: Not on file  Social Connections: Not on file  Intimate Partner Violence: Not on file    FAMILY HISTORY:  Family History  Problem Relation Age of Onset   Heart failure Mother        CHF   Cancer Mother        unknown   Prostate cancer Father    Narcolepsy Sister    Cancer Daughter        unknown   Breast cancer Maternal Aunt    Cancer Maternal Grandmother        unknown   Cancer Maternal Aunt        unknown type of cancer    CURRENT MEDICATIONS:  Current Outpatient Medications  Medication Sig Dispense Refill   acetaminophen (TYLENOL)  650 MG CR tablet Take by mouth. (Patient not taking: Reported on 11/27/2020)     calcium-vitamin D (OSCAL WITH D) 500-200 MG-UNIT TABS tablet Take 1 tablet by mouth daily.  3   cetirizine (ZYRTEC) 10 MG tablet TAKE ONE TABLET BY MOUTH DAILY. 30 tablet 0   Cholecalciferol (VITAMIN D3) 25 MCG (1000 UT) CHEW Chew by mouth daily.     DOCETAXEL IV Inject into the vein every 21 ( twenty-one) days.     fluticasone (FLONASE) 50 MCG/ACT nasal spray Place into the nose.     furosemide (LASIX) 40 MG tablet TAKE ONE TABLET BY MOUTH DAILY. TAKE WITH POTASSIUM. 30 tablet 5   HYDROcodone-acetaminophen (NORCO/VICODIN) 5-325 MG tablet Take by mouth. (Patient not taking: Reported on 11/27/2020)     lidocaine-prilocaine (EMLA) cream Apply small amount to port a cath site and cover with plastic wrap 1 hour prior to chemotherapy appointments (Patient not taking: Reported on 11/27/2020) 30 g 0   losartan (COZAAR) 100 MG tablet Take 100 mg by mouth daily.  5   methylphenidate (RITALIN) 20 MG tablet Take 20 mg by mouth daily.  0   mirabegron ER (MYRBETRIQ) 25 MG TB24 tablet Take by mouth.     potassium chloride (KLOR-CON) 20 MEQ packet Take by  mouth.     prochlorperazine (COMPAZINE) 10 MG tablet Take 1 tablet (10 mg total) by mouth every 6 (six) hours as needed for nausea or vomiting. (Patient not taking: Reported on 11/27/2020) 30 tablet 0   senna-docusate (SENOKOT-S) 8.6-50 MG tablet Take by mouth.     No current facility-administered medications for this visit.    ALLERGIES:  No Known Allergies  PHYSICAL EXAM:  Performance status (ECOG): 1 - Symptomatic but completely ambulatory  There were no vitals filed for this visit. Wt Readings from Last 3 Encounters:  11/27/20 244 lb 3.2 oz (110.8 kg)  10/31/20 253 lb (114.8 kg)  10/08/20 255 lb 8.2 oz (115.9 kg)   Physical Exam Vitals reviewed.  Constitutional:      Appearance: Normal appearance.     Interventions: Nasal cannula in place.     Comments: In wheelchair  Cardiovascular:     Rate and Rhythm: Normal rate and regular rhythm.     Pulses: Normal pulses.     Heart sounds: Normal heart sounds.  Pulmonary:     Effort: Pulmonary effort is normal.     Breath sounds: Normal breath sounds.  Neurological:     General: No focal deficit present.     Mental Status: He is alert and oriented to person, place, and time.  Psychiatric:        Mood and Affect: Mood normal.        Behavior: Behavior normal.      LABORATORY DATA:  I have reviewed the labs as listed.  CBC Latest Ref Rng & Units 10/31/2020 10/08/2020 04/12/2020  WBC 4.0 - 10.5 K/uL 9.5 7.1 8.1  Hemoglobin 13.0 - 17.0 g/dL 11.4(L) 9.4(L) 9.9(L)  Hematocrit 39.0 - 52.0 % 36.5(L) 30.0(L) 31.7(L)  Platelets 150 - 400 K/uL 220 214 214   CMP Latest Ref Rng & Units 10/31/2020 10/08/2020 04/12/2020  Glucose 70 - 99 mg/dL 124(H) 99 91  BUN 8 - 23 mg/dL 22 16 17   Creatinine 0.61 - 1.24 mg/dL 1.69(H) 1.56(H) 1.67(H)  Sodium 135 - 145 mmol/L 139 137 139  Potassium 3.5 - 5.1 mmol/L 4.2 4.0 3.9  Chloride 98 - 111 mmol/L 106 103 101  CO2 22 - 32  mmol/L 23 26 26   Calcium 8.9 - 10.3 mg/dL 9.4 9.3 9.1  Total Protein 6.5 - 8.1  g/dL 7.5 - 6.9  Total Bilirubin 0.3 - 1.2 mg/dL 0.5 - 0.7  Alkaline Phos 38 - 126 U/L 63 - 76  AST 15 - 41 U/L 24 - 22  ALT 0 - 44 U/L 20 - 17    DIAGNOSTIC IMAGING:  I have independently reviewed the scans and discussed with the patient. No results found.   ASSESSMENT:  1.  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 -Germline mutation testing negative.  Guardant 360 was negative for any actionable mutations. -11 cycles of docetaxel from 09/16/2019 through 04/12/2020. -CTAP on 12/01/2019 showed mild enlargement of the left iliac adenopathy measuring 6.4 x 4 cm, previously 5.6 x 3.77.  This was compared to CT scan from September 2020. -Chemotherapy was held as he sustained right proximal femur fracture and transition to [place] home.  His sister is his healthcare power of attorney. -CT [facility] with contrast on 11/23/2020 shows significant increase in size of the left iliac nodal mass measuring 8.9 x 7.3 cm (6.4 x 4 cm).  This surrounds the stented left ureter.  Urinary bladder wall thickening present.  Equivocal rectal wall thickening.  Stable 0.7 cm sclerotic lesion in the left iliac bone. -PSA on 10/31/2020 of 61.19.   PLAN:  1.  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 - He sustained femur fracture followed by placement at [facility].  He was subsequently transferred to [facility] for rehab since March of this year.  He was subsequently lost to follow-up. - Last Lupron was on 11/28/2020, 45 mg.  I have reviewed all the interim records including scans. - Reviewed his labs today which showed creatinine has slightly improved to 1.27.  Calcium is 10.  Albumin is 3.3.  His PSA is 317. - I do not believe he is a candidate for chemotherapy.  Most of the day he spends time in the wheelchair. - He will receive Lupron injection today. - I have talked to him about  best supportive care in the form of hospice.  He is agreeable.  We will also reach out to his healthcare proxy who is his sister. - We will make referral to hospice.  RTC as needed.   2.  Bone metastasis: - Last denosumab was in May 2021.  Today calcium is 10 with albumin 3.3. - She will receive denosumab today.   3.  CKD: - His creatinine has improved to 1.27 today.   4.  Back and thigh pain: - We will start him on hydrocodone 5/325 every 12 hours as needed.   Orders placed this encounter:  No orders of the defined types were placed in this encounter.    Derek Jack, MD [facility] [phone]   I, Thana Ates, am acting as a scribe for Dr. Derek Jack.  I, Derek Jack MD, have reviewed the above documentation for accuracy and completeness, and I agree with the above.

## 2021-06-27 ENCOUNTER — Inpatient Hospital Stay (HOSPITAL_BASED_OUTPATIENT_CLINIC_OR_DEPARTMENT_OTHER): Payer: Medicare Other | Admitting: Hematology

## 2021-06-27 ENCOUNTER — Other Ambulatory Visit: Payer: Self-pay

## 2021-06-27 ENCOUNTER — Inpatient Hospital Stay (HOSPITAL_COMMUNITY): Payer: Medicare Other

## 2021-06-27 ENCOUNTER — Inpatient Hospital Stay (HOSPITAL_COMMUNITY): Payer: Medicare Other | Attending: Hematology

## 2021-06-27 VITALS — BP 124/58 | HR 102 | Temp 96.7°F | Resp 20

## 2021-06-27 DIAGNOSIS — C7951 Secondary malignant neoplasm of bone: Secondary | ICD-10-CM

## 2021-06-27 DIAGNOSIS — Z79899 Other long term (current) drug therapy: Secondary | ICD-10-CM | POA: Diagnosis not present

## 2021-06-27 DIAGNOSIS — C61 Malignant neoplasm of prostate: Secondary | ICD-10-CM

## 2021-06-27 DIAGNOSIS — Z5111 Encounter for antineoplastic chemotherapy: Secondary | ICD-10-CM | POA: Insufficient documentation

## 2021-06-27 DIAGNOSIS — E119 Type 2 diabetes mellitus without complications: Secondary | ICD-10-CM | POA: Diagnosis not present

## 2021-06-27 LAB — CBC WITH DIFFERENTIAL/PLATELET
Abs Immature Granulocytes: 0.11 10*3/uL — ABNORMAL HIGH (ref 0.00–0.07)
Basophils Absolute: 0 10*3/uL (ref 0.0–0.1)
Basophils Relative: 0 %
Eosinophils Absolute: 0.2 10*3/uL (ref 0.0–0.5)
Eosinophils Relative: 2 %
HCT: 30.4 % — ABNORMAL LOW (ref 39.0–52.0)
Hemoglobin: 9.4 g/dL — ABNORMAL LOW (ref 13.0–17.0)
Immature Granulocytes: 1 %
Lymphocytes Relative: 24 %
Lymphs Abs: 2.4 10*3/uL (ref 0.7–4.0)
MCH: 25.7 pg — ABNORMAL LOW (ref 26.0–34.0)
MCHC: 30.9 g/dL (ref 30.0–36.0)
MCV: 83.1 fL (ref 80.0–100.0)
Monocytes Absolute: 0.5 10*3/uL (ref 0.1–1.0)
Monocytes Relative: 5 %
Neutro Abs: 7.1 10*3/uL (ref 1.7–7.7)
Neutrophils Relative %: 68 %
Platelets: 358 10*3/uL (ref 150–400)
RBC: 3.66 MIL/uL — ABNORMAL LOW (ref 4.22–5.81)
RDW: 19.4 % — ABNORMAL HIGH (ref 11.5–15.5)
WBC: 10.3 10*3/uL (ref 4.0–10.5)
nRBC: 0 % (ref 0.0–0.2)

## 2021-06-27 LAB — COMPREHENSIVE METABOLIC PANEL
ALT: 26 U/L (ref 0–44)
AST: 30 U/L (ref 15–41)
Albumin: 3.3 g/dL — ABNORMAL LOW (ref 3.5–5.0)
Alkaline Phosphatase: 137 U/L — ABNORMAL HIGH (ref 38–126)
Anion gap: 6 (ref 5–15)
BUN: 18 mg/dL (ref 8–23)
CO2: 19 mmol/L — ABNORMAL LOW (ref 22–32)
Calcium: 10 mg/dL (ref 8.9–10.3)
Chloride: 116 mmol/L — ABNORMAL HIGH (ref 98–111)
Creatinine, Ser: 1.27 mg/dL — ABNORMAL HIGH (ref 0.61–1.24)
GFR, Estimated: 57 mL/min — ABNORMAL LOW (ref >60)
Glucose, Bld: 100 mg/dL — ABNORMAL HIGH (ref 70–99)
Potassium: 3.3 mmol/L — ABNORMAL LOW (ref 3.5–5.1)
Sodium: 141 mmol/L (ref 135–145)
Total Bilirubin: 0.3 mg/dL (ref 0.3–1.2)
Total Protein: 7.4 g/dL (ref 6.5–8.1)

## 2021-06-27 LAB — PSA: Prostatic Specific Antigen: 317 ng/mL — ABNORMAL HIGH (ref 0.00–4.00)

## 2021-06-27 MED ORDER — DENOSUMAB 120 MG/1.7ML ~~LOC~~ SOLN
120.0000 mg | Freq: Once | SUBCUTANEOUS | Status: AC
  Administered 2021-06-27: 120 mg via SUBCUTANEOUS
  Filled 2021-06-27: qty 1.7

## 2021-06-27 MED ORDER — LEUPROLIDE ACETATE (6 MONTH) 45 MG ~~LOC~~ KIT
45.0000 mg | PACK | Freq: Once | SUBCUTANEOUS | Status: AC
  Administered 2021-06-27: 45 mg via SUBCUTANEOUS
  Filled 2021-06-27: qty 45

## 2021-06-27 NOTE — Patient Instructions (Addendum)
[name] at [facility] Discharge Instructions  You were seen today by Dr. Delton Coombes. He went over your recent results and scans, and you received your injections. You will be referred for a consultation with hospice. Dr. Delton Coombes will see you back in as needed for follow up.   Thank you for choosing [facility] at [facility] to provide your oncology and hematology care.  To afford each patient quality time with our provider, please arrive at least 15 minutes before your scheduled appointment time.   If you have a lab appointment with the [facility] please come in thru the Main Entrance and check in at the main information desk  You need to re-schedule your appointment should you arrive 10 or more minutes late.  We strive to give you quality time with our providers, and arriving late affects you and other patients whose appointments are after yours.  Also, if you no show three or more times for appointments you may be dismissed from the clinic at the providers discretion.     Again, thank you for choosing [facility].  Our hope is that these requests will decrease the amount of time that you wait before being seen by our physicians.       _____________________________________________________________  Should you have questions after your visit to [facility], please contact our office at [phone] between the hours of 8:00 a.m. and 4:30 p.m.  Voicemails left after 4:00 p.m. will not be returned until the following business day.  For prescription refill requests, have your pharmacy contact our office and allow 72 hours.    Cancer Center Support Programs:   > Cancer Support Group  2nd Tuesday of the month 1pm-2pm, Journey Room

## 2021-06-27 NOTE — Progress Notes (Signed)
Patient received Eligard and Xgeva Sq in abdominal tissue.  Tolerated well without complications.  Discharged in stable condition via wheelchair by caregiver.

## 2021-06-28 DIAGNOSIS — C7951 Secondary malignant neoplasm of bone: Secondary | ICD-10-CM | POA: Diagnosis not present

## 2021-06-30 ENCOUNTER — Encounter (HOSPITAL_COMMUNITY): Payer: Self-pay | Admitting: Hematology

## 2021-06-30 ENCOUNTER — Encounter (HOSPITAL_COMMUNITY): Payer: Self-pay | Admitting: Internal Medicine

## 2021-07-27 DEATH — deceased

## 2021-07-31 ENCOUNTER — Ambulatory Visit: Payer: Medicare Other | Admitting: Urology
